# Patient Record
Sex: Female | Born: 1940 | Race: White | Hispanic: No | Marital: Married | Smoking: Former smoker
Health system: Southern US, Community
[De-identification: ages and names within clinical notes are randomized; demographics above are authoritative.]

## PROBLEM LIST (undated history)

## (undated) DIAGNOSIS — I272 Pulmonary hypertension, unspecified: Secondary | ICD-10-CM

## (undated) DIAGNOSIS — Z720 Tobacco use: Secondary | ICD-10-CM

## (undated) DIAGNOSIS — M519 Unspecified thoracic, thoracolumbar and lumbosacral intervertebral disc disorder: Secondary | ICD-10-CM

## (undated) DIAGNOSIS — I350 Nonrheumatic aortic (valve) stenosis: Secondary | ICD-10-CM

## (undated) DIAGNOSIS — I5189 Other ill-defined heart diseases: Secondary | ICD-10-CM

## (undated) DIAGNOSIS — E782 Mixed hyperlipidemia: Secondary | ICD-10-CM

## (undated) DIAGNOSIS — I340 Nonrheumatic mitral (valve) insufficiency: Secondary | ICD-10-CM

## (undated) DIAGNOSIS — E785 Hyperlipidemia, unspecified: Secondary | ICD-10-CM

## (undated) DIAGNOSIS — H9190 Unspecified hearing loss, unspecified ear: Secondary | ICD-10-CM

## (undated) DIAGNOSIS — I4821 Permanent atrial fibrillation: Secondary | ICD-10-CM

## (undated) DIAGNOSIS — J449 Chronic obstructive pulmonary disease, unspecified: Secondary | ICD-10-CM

## (undated) DIAGNOSIS — N1831 Chronic kidney disease, stage 3a: Secondary | ICD-10-CM

## (undated) DIAGNOSIS — C55 Malignant neoplasm of uterus, part unspecified: Secondary | ICD-10-CM

## (undated) DIAGNOSIS — I351 Nonrheumatic aortic (valve) insufficiency: Secondary | ICD-10-CM

## (undated) DIAGNOSIS — F329 Major depressive disorder, single episode, unspecified: Secondary | ICD-10-CM

## (undated) DIAGNOSIS — I10 Essential (primary) hypertension: Secondary | ICD-10-CM

## (undated) DIAGNOSIS — J9611 Chronic respiratory failure with hypoxia: Secondary | ICD-10-CM

## (undated) DIAGNOSIS — M1990 Unspecified osteoarthritis, unspecified site: Secondary | ICD-10-CM

## (undated) DIAGNOSIS — I071 Rheumatic tricuspid insufficiency: Secondary | ICD-10-CM

## (undated) DIAGNOSIS — F32A Depression, unspecified: Secondary | ICD-10-CM

## (undated) HISTORY — DX: Nonrheumatic mitral (valve) insufficiency: I34.0

## (undated) HISTORY — DX: Rheumatic tricuspid insufficiency: I07.1

## (undated) HISTORY — DX: Malignant neoplasm of uterus, part unspecified: C55

## (undated) HISTORY — DX: Chronic respiratory failure with hypoxia: J96.11

## (undated) HISTORY — PX: CERVICAL DISC SURGERY: SHX588

## (undated) HISTORY — DX: Nonrheumatic aortic (valve) insufficiency: I35.1

## (undated) HISTORY — PX: KYPHOPLASTY: SHX5884

## (undated) HISTORY — DX: Nonrheumatic aortic (valve) stenosis: I35.0

## (undated) HISTORY — DX: Pulmonary hypertension, unspecified: I27.20

## (undated) HISTORY — DX: Chronic kidney disease, stage 3a: N18.31

## (undated) HISTORY — DX: Permanent atrial fibrillation: I48.21

## (undated) HISTORY — DX: Chronic obstructive pulmonary disease, unspecified: J44.9

## (undated) HISTORY — PX: ABDOMINAL HYSTERECTOMY: SHX81

## (undated) HISTORY — DX: Essential (primary) hypertension: I10

## (undated) HISTORY — DX: Hyperlipidemia, unspecified: E78.5

## (undated) HISTORY — DX: Tobacco use: Z72.0

## (undated) HISTORY — DX: Other ill-defined heart diseases: I51.89

## (undated) HISTORY — DX: Unspecified thoracic, thoracolumbar and lumbosacral intervertebral disc disorder: M51.9

## (undated) HISTORY — DX: Mixed hyperlipidemia: E78.2

---

## 1999-05-13 ENCOUNTER — Encounter: Payer: Self-pay | Admitting: Cardiology

## 1999-05-13 ENCOUNTER — Ambulatory Visit (HOSPITAL_COMMUNITY): Admission: RE | Admit: 1999-05-13 | Discharge: 1999-05-13 | Payer: Self-pay | Admitting: Cardiology

## 2000-08-10 ENCOUNTER — Ambulatory Visit (HOSPITAL_COMMUNITY): Admission: RE | Admit: 2000-08-10 | Discharge: 2000-08-10 | Payer: Self-pay | Admitting: Cardiology

## 2000-11-02 ENCOUNTER — Ambulatory Visit (HOSPITAL_COMMUNITY): Admission: EM | Admit: 2000-11-02 | Discharge: 2000-11-02 | Payer: Self-pay | Admitting: Family Medicine

## 2000-11-02 ENCOUNTER — Encounter: Payer: Self-pay | Admitting: Family Medicine

## 2005-01-11 ENCOUNTER — Ambulatory Visit: Payer: Self-pay | Admitting: Cardiology

## 2005-02-07 ENCOUNTER — Ambulatory Visit: Payer: Self-pay

## 2005-12-11 ENCOUNTER — Encounter: Admission: RE | Admit: 2005-12-11 | Discharge: 2005-12-11 | Payer: Self-pay | Admitting: Neurosurgery

## 2006-10-13 ENCOUNTER — Encounter: Admission: RE | Admit: 2006-10-13 | Discharge: 2006-10-13 | Payer: Self-pay | Admitting: Family Medicine

## 2008-01-16 ENCOUNTER — Ambulatory Visit: Payer: Self-pay | Admitting: Cardiology

## 2008-06-20 ENCOUNTER — Ambulatory Visit: Payer: Self-pay | Admitting: Cardiology

## 2008-09-12 ENCOUNTER — Encounter: Payer: Self-pay | Admitting: Internal Medicine

## 2009-01-10 ENCOUNTER — Encounter: Payer: Self-pay | Admitting: Cardiology

## 2009-01-30 ENCOUNTER — Encounter: Payer: Self-pay | Admitting: Internal Medicine

## 2009-02-10 DIAGNOSIS — F17200 Nicotine dependence, unspecified, uncomplicated: Secondary | ICD-10-CM | POA: Insufficient documentation

## 2009-02-10 DIAGNOSIS — I272 Pulmonary hypertension, unspecified: Secondary | ICD-10-CM | POA: Insufficient documentation

## 2009-02-10 DIAGNOSIS — E785 Hyperlipidemia, unspecified: Secondary | ICD-10-CM | POA: Insufficient documentation

## 2009-02-10 DIAGNOSIS — I2789 Other specified pulmonary heart diseases: Secondary | ICD-10-CM

## 2009-02-10 DIAGNOSIS — I4891 Unspecified atrial fibrillation: Secondary | ICD-10-CM | POA: Insufficient documentation

## 2009-02-11 ENCOUNTER — Ambulatory Visit: Payer: Self-pay | Admitting: Cardiology

## 2009-09-22 ENCOUNTER — Encounter (INDEPENDENT_AMBULATORY_CARE_PROVIDER_SITE_OTHER): Payer: Self-pay | Admitting: *Deleted

## 2009-10-08 ENCOUNTER — Ambulatory Visit: Payer: Self-pay | Admitting: Cardiology

## 2009-10-08 DIAGNOSIS — R079 Chest pain, unspecified: Secondary | ICD-10-CM

## 2009-11-05 ENCOUNTER — Encounter: Admission: RE | Admit: 2009-11-05 | Discharge: 2009-11-05 | Payer: Self-pay | Admitting: Neurosurgery

## 2010-01-13 ENCOUNTER — Encounter: Admission: RE | Admit: 2010-01-13 | Discharge: 2010-01-13 | Payer: Self-pay | Admitting: Neurosurgery

## 2010-02-09 ENCOUNTER — Encounter: Payer: Self-pay | Admitting: Internal Medicine

## 2010-04-06 ENCOUNTER — Encounter: Admission: RE | Admit: 2010-04-06 | Discharge: 2010-04-06 | Payer: Self-pay | Admitting: Neurosurgery

## 2010-04-20 ENCOUNTER — Ambulatory Visit: Payer: Self-pay | Admitting: Internal Medicine

## 2010-04-20 DIAGNOSIS — R0609 Other forms of dyspnea: Secondary | ICD-10-CM

## 2010-04-20 DIAGNOSIS — R0989 Other specified symptoms and signs involving the circulatory and respiratory systems: Secondary | ICD-10-CM | POA: Insufficient documentation

## 2010-04-21 ENCOUNTER — Encounter: Payer: Self-pay | Admitting: Internal Medicine

## 2010-06-30 ENCOUNTER — Ambulatory Visit: Payer: Self-pay | Admitting: Cardiology

## 2010-06-30 ENCOUNTER — Encounter: Payer: Self-pay | Admitting: Cardiology

## 2010-07-01 ENCOUNTER — Telehealth (INDEPENDENT_AMBULATORY_CARE_PROVIDER_SITE_OTHER): Payer: Self-pay | Admitting: *Deleted

## 2010-07-02 ENCOUNTER — Telehealth (INDEPENDENT_AMBULATORY_CARE_PROVIDER_SITE_OTHER): Payer: Self-pay | Admitting: *Deleted

## 2010-07-05 ENCOUNTER — Ambulatory Visit: Payer: Self-pay

## 2010-07-05 ENCOUNTER — Ambulatory Visit (HOSPITAL_COMMUNITY): Admission: RE | Admit: 2010-07-05 | Payer: Self-pay | Source: Home / Self Care | Admitting: Cardiology

## 2010-07-09 ENCOUNTER — Ambulatory Visit: Payer: Self-pay

## 2010-07-29 ENCOUNTER — Telehealth (INDEPENDENT_AMBULATORY_CARE_PROVIDER_SITE_OTHER): Payer: Self-pay | Admitting: *Deleted

## 2010-07-30 ENCOUNTER — Ambulatory Visit: Admission: RE | Admit: 2010-07-30 | Discharge: 2010-07-30 | Payer: Self-pay | Source: Home / Self Care

## 2010-07-30 ENCOUNTER — Ambulatory Visit (HOSPITAL_COMMUNITY)
Admission: RE | Admit: 2010-07-30 | Discharge: 2010-07-30 | Payer: Self-pay | Source: Home / Self Care | Attending: Cardiology | Admitting: Cardiology

## 2010-07-30 ENCOUNTER — Ambulatory Visit: Admit: 2010-07-30 | Payer: Self-pay

## 2010-08-03 ENCOUNTER — Telehealth: Payer: Self-pay | Admitting: Cardiology

## 2010-08-05 ENCOUNTER — Telehealth: Payer: Self-pay | Admitting: Cardiology

## 2010-08-08 ENCOUNTER — Encounter: Payer: Self-pay | Admitting: Neurosurgery

## 2010-08-08 ENCOUNTER — Encounter: Payer: Self-pay | Admitting: Family Medicine

## 2010-08-16 ENCOUNTER — Telehealth: Payer: Self-pay | Admitting: Cardiology

## 2010-08-19 NOTE — Assessment & Plan Note (Signed)
Summary: rov/jml  Medications Added LOVAZA 1 GM CAPS (OMEGA-3-ACID ETHYL ESTERS) 2 by mouth two times a day MULTIVITAMINS   TABS (MULTIPLE VITAMIN) 1 by mouth daily VITAMIN D (ERGOCALCIFEROL) 50000 UNIT CAPS (ERGOCALCIFEROL) 1 by mouth weekly METOPROLOL SUCCINATE 50 MG XR24H-TAB (METOPROLOL SUCCINATE) 1 by mouth daily LANOXIN 0.25 MG TABS (DIGOXIN) 1 by mouth daily OXYCODONE-ACETAMINOPHEN 5-325 MG TABS (OXYCODONE-ACETAMINOPHEN) 1 by mouth as needed JANTOVEN 6 MG TABS (WARFARIN SODIUM) 1 and 1/2 by mouth as directed LIPITOR 80 MG TABS (ATORVASTATIN CALCIUM) 1 by mouth daily LEXAPRO 20 MG TABS (ESCITALOPRAM OXALATE) 1 by mouth daily TORSEMIDE 20 MG TABS (TORSEMIDE) 1 and 1/2 by mouth daily TRILIPIX 135 MG CPDR (CHOLINE FENOFIBRATE) 1 by mouth daily ADVAIR DISKUS 100-50 MCG/DOSE AEPB (FLUTICASONE-SALMETEROL) as directed * OXYGEN as needed        Visit Type:  Follow-up Referring Provider:  Dr. Glenna Fellows Primary Provider:  Dr. Laurance Flatten  CC:  Atrial Fibrillation.  History of Present Illness: The patient presents for preprocedure evaluation prior to getting injections for back pain. She has permanent atrial fibrillation. She has also been evaluated in the past for chest discomfort with the stress perfusion study several years ago. She refused stress testing last year. She says she is limited by back pain. She gets around and does household activities. With this she does not bring on any chest pressure, neck or arm discomfort. She doesn't really notice palpitations and hasn't had any presyncope or syncope. She's had chronic dyspnea. She does get chest discomfort but it is only at night. She describes a broad area over her entire chest mostly on the left side and down into her abdomen. It seems to be positional. It happening daily for months. Again she has refused stress testing in the past and would refuse it again today. She says she can't intensify this discomfort with activity. She just can't find  a comfortable position. It has to wear off by itself.  Past History:  Past Medical History: Reviewed history from 02/10/2009 and no changes required.  1. Hyperlipidemia.   2. Permanent atrial fibrillation.   3. Mild pulmonary hypertension noted on echo.   4. Diastolic dysfunction noted on echo with some apparent volume       issues in the past, but not severe.   5. Ongoing tobacco abuse.      Past Surgical History:  1. Uterine cancer with hysterectomy.   2. Two cervical disk surgeries.      Review of Systems       As stated in the HPI and negative for all other systems.   Vital Signs:  Patient profile:   70 year old female Height:      64 inches Weight:      163 pounds BMI:     28.08 Pulse rate:   62 / minute Resp:     18 per minute BP sitting:   122 / 80  (right arm)  Vitals Entered By: Levora Angel, CNA (October 08, 2009 9:52 AM)  Physical Exam  General:  Well developed, well nourished, in no acute distress. Head:  normocephalic and atraumatic Eyes:  PERRLA/EOM intact; conjunctiva and lids normal. Mouth:  poor dentition Neck:  Neck supple, no JVD. No masses, thyromegaly or abnormal cervical nodes. Chest Wall:  no deformities or breast masses noted Lungs:  Clear bilaterally to auscultation and percussion. Abdomen:  Bowel sounds positive; abdomen soft and non-tender without masses, organomegaly, or hernias noted. No hepatosplenomegaly. Msk:  Back normal, normal  gait. Muscle strength and tone normal. Extremities:  No clubbing or cyanosis. Neurologic:  Alert and oriented x 3. Skin:  Intact without lesions or rashes. Cervical Nodes:  no significant adenopathy Axillary Nodes:  no significant adenopathy Inguinal Nodes:  no significant adenopathy Psych:  Normal affect.   Detailed Cardiovascular Exam  Neck    Carotids: Carotids full and equal bilaterally without bruits.      Neck Veins: Normal, no JVD.    Heart    Inspection: no deformities or lifts noted.       Palpation: normal PMI with no thrills palpable.      Auscultation: irregular rate and rhythm, S1, S2 without murmurs, rubs, gallops, or clicks.    Vascular    Abdominal Aorta: no palpable masses, pulsations, or audible bruits.      Femoral Pulses: normal femoral pulses bilaterally.      Pedal Pulses: normal pedal pulses bilaterally.      Radial Pulses: normal radial pulses bilaterally.      Peripheral Circulation: no clubbing, cyanosis, or edema noted with normal capillary refill.     Impression & Recommendations:  Problem # 1:  ATRIAL FIBRILLATION (ICD-427.31) The patient tolerates this rhythm and has failed cardioversion. She can discontinue the Coumadin 5 days before the planned procedure. She should restart it when it is felt safe after the procedure and I have asked her to ask Dr. Carloyn Manner about this. She does not need bridging Lovenox injections as she is low risk for thromboembolic events in the short period off of Coumadin. Orders: EKG w/ Interpretation (93000)  Problem # 2:  TOBACCO ABUSE (ICD-305.1) I discussed the need to stop smoking (greater than 3 minutes). I encouraged that she re\re start Chantix which she apparently tried in the past.  Problem # 3:  CHEST PAIN (ICD-786.50) The patient describes chest pain that is atypical. I have suggested that she followup with a stress test as was suggested when she was seen in consultation at [facility] last fall. However, she again refuses. I do not believe that this atypical pain would preclude her from the planned procedure however.  Patient Instructions: 1)  Your physician recommends that you schedule a follow-up appointment in: 6 months in [place] office 2)  Your physician recommends that you continue on your current medications as directed. Please refer to the Current Medication list given to you today. 3)  May hold Coumadin 5 days before surgery and restart when Dr Carloyn Manner says it's OK.

## 2010-08-19 NOTE — Assessment & Plan Note (Signed)
Summary: Pulmonary / preop eval, no sign copd   Visit Type:  Initial Consult Copy to:  Dr. Glenna Fellows Primary Provider/Referring Provider:  Dr. Redge Gainer  CC:  Dyspnea.  History of Present Illness: 62 yowf smoker planning to quit but needs clearance for back surgery by Dr Carloyn Manner.  April 20, 2010  1st pulmonary office eval  states when back is better from either a  shot  or pain pills able to go mailbox and wlk up and down the aisles from grocery store but uses 02 when gets home when she gets tired,   but not using 02  continously or at hs.   Has advair but not using.  mild am cough and congestion.  No recent  significant sore throat, dysphagia, itching, sneezing,  nasal congestion or excess secretions,  fever, chills, sweats, unintended wt loss, pleuritic or exertional cp, hempoptysis, change in activity tolerance  orthopnea pnd or leg swelling. Pt also denies any obvious fluctuation in symptoms with weather or environmental change or other alleviating or aggravating factors.     no better on advair vs off  Current Medications (verified): 1)  Lovaza 1 Gm Caps (Omega-3-Acid Ethyl Esters) .... 2 By Mouth Two Times A Day 2)  Centrum Silver  Tabs (Multiple Vitamins-Minerals) .Marland Kitchen.. 1 Once Daily 3)  Metoprolol Succinate 50 Mg Xr24h-Tab (Metoprolol Succinate) .Marland Kitchen.. 1 By Mouth Daily 4)  Lanoxin 0.25 Mg Tabs (Digoxin) .... 1/2 Once Daily 5)  Oxycodone-Acetaminophen 5-325 Mg Tabs (Oxycodone-Acetaminophen) .... 2 Every 8 Hrs As Needed 6)  Lipitor 80 Mg Tabs (Atorvastatin Calcium) .Marland Kitchen.. 1 By Mouth Daily 7)  Paxil 40 Mg Tabs (Paroxetine Hcl) .Marland Kitchen.. 1 Once Daily 8)  Demadex 20 Mg Tabs (Torsemide) .Marland Kitchen.. 1 Once Daily - Pt States Only Takes "maybe Twice Per Wk" 9)  Advair Diskus 100-50 Mcg/dose Aepb (Fluticasone-Salmeterol) .Marland Kitchen.. 1 Puff Two Times A Day 10)  Oxygen 3 Liters/min .... As Needed With Exertion 11)  Warfarin Sodium 6 Mg Tabs (Warfarin Sodium) .... As Directed Per Clinic 12)  Oscal 500/200 D-3 500-200  Mg-Unit Tabs (Calcium-Vitamin D) .Marland Kitchen.. 1 Once Daily  Allergies (verified): No Known Drug Allergies  Past History:  Past Medical History:  1. Hyperlipidemia.   2. Permanent atrial fibrillation.   3. Mild pulmonary hypertension noted on echo.   4. Diastolic dysfunction noted on echo with some apparent volume       issues in the past, but not severe.   5. Ongoing tobacco abuse.  COPD     - Spirometry April 20, 2010 FEV1  1.38 ( 58%) with ratio 70     Family History: Family History of Coronary Artery Disease:  Negative for respiratory diseases or atopy   Social History: Disabled Children Single Current smoker since age 76.  Smokes 1 1/2 ppd. No ETOH  Review of Systems       The patient complains of shortness of breath with activity, productive cough, chest pain, irregular heartbeats, difficulty swallowing, sneezing, anxiety, depression, hand/feet swelling, and joint stiffness or pain.  The patient denies shortness of breath at rest, non-productive cough, coughing up blood, acid heartburn, indigestion, loss of appetite, weight change, abdominal pain, sore throat, tooth/dental problems, headaches, nasal congestion/difficulty breathing through nose, itching, ear ache, rash, change in color of mucus, and fever.    Vital Signs:  Patient profile:   70 year old female Weight:      158 pounds O2 Sat:      95 % on Room air  Temp:     97.4 degrees F oral Pulse rate:   51 / minute BP sitting:   112 / 76  (left arm)  Vitals Entered By: Tilden Dome (April 20, 2010 2:59 PM)  O2 Flow:  Room air  Serial Vital Signs/Assessments:  Comments: 3:48 PM Ambulatory Pulse Oximetry  Resting; HR___68__    02 Sat__94% on room air___  Lap1 (185 feet)   HR___75__   02 Sat__93% on room air___ Lap2 (185 feet)   HR__80___   02 Sat__94% on room air___    Lap3 (185 feet)   HR__84___   02 Sat__95% on room air__  __x_Test Completed without Difficulty ___Test Stopped due to:  By: Francesca Jewett  CMA    Physical Exam  Additional Exam:  amb wf nad HEENT: nl dentition, turbinates, and orophanx. Nl external ear canals without cough reflex NECK :  without JVD/Nodes/TM/ nl carotid upstrokes bilaterally LUNGS: no acc muscle use, clear to A and P bilaterally without cough on insp or exp maneuvers CV:  RRR  no s3 or murmur or increase in P2, no edema  ABD:  soft and nontender with nl excursion in the supine position. No bruits or organomegaly, bowel sounds nl MS:  warm without deformities, calf tenderness, cyanosis or clubbing SKIN: warm and dry without lesions   NEURO:  alert, approp, no deficits     CXR  Procedure date:  02/09/2010  Findings:      Cardilmegaly with improvement in previously seen bibasilar interstitial accentuation  Impression & Recommendations:  Problem # 1:  DYSPNEA (ICD-786.09) Likely related to chf/caf and deconditioning but unlikely the chronic problem is pulmonary related  Spirometry does not support sign copd though she does appear to have intermittent asthmatic bronchitis and is certainly at risk of post op atx and pna/ resp failure  No need for additional med rx preop but needs to quit smoking for 2 week preop if wants to reduce risks  Problem # 2:  TOBACCO ABUSE (ICD-305.1)   I emphasized that although we never turn away smokers from the pulmonary clinic, we do ask that they understand that the recommendations that were made won't work nearly as well in the presence of continued cigarette exposure and we may reach a point where we can't help the patient if he/she can't quit smoking.    In this case if she can't quit for 2 weeks post op she and her husband will need to understand and accept additional post op risks discussed above.  Medications Added to Medication List This Visit: 1)  Centrum Silver Tabs (Multiple vitamins-minerals) .Marland Kitchen.. 1 once daily 2)  Lanoxin 0.25 Mg Tabs (Digoxin) .... 1/2 once daily 3)  Oxycodone-acetaminophen 5-325 Mg Tabs  (Oxycodone-acetaminophen) .... 2 every 8 hrs as needed 4)  Paxil 40 Mg Tabs (Paroxetine hcl) .Marland Kitchen.. 1 once daily 5)  Demadex 20 Mg Tabs (Torsemide) .Marland Kitchen.. 1 once daily - pt states only takes "maybe twice per wk" 6)  Advair Diskus 100-50 Mcg/dose Aepb (Fluticasone-salmeterol) .Marland Kitchen.. 1 puff two times a day 7)  Oxygen 3 Liters/min  .... As needed with exertion 8)  Warfarin Sodium 6 Mg Tabs (Warfarin sodium) .... As directed per clinic 9)  Oscal 500/200 D-3 500-200 Mg-unit Tabs (Calcium-vitamin d) .Marland Kitchen.. 1 once daily  Other Orders: New Patient Level V [id number]) Spirometry w/Graph (94010) Pulse Oximetry, Ambulatory [id number])  Patient Instructions: 1)  You are cleared for surgery but need to stop smoking for 2 weeks preop to reduce risk of  post op complications

## 2010-08-19 NOTE — Progress Notes (Signed)
Summary: re-fax clearance letter   Phone Note From Other Clinic   Caller: [facility] office [phone]. fax [phone] Request: Talk with Nurse Details of Complaint: pls refax clearance letter.  Initial call taken by: Neil Crouch,  August 05, 2010 3:36 PM  Follow-up for Phone Call        done Follow-up by: Sim Boast, RN,  August 05, 2010 3:40 PM

## 2010-08-19 NOTE — Progress Notes (Signed)
Summary: NEED CLEARANCE    done   Phone Note Other Incoming   Caller: DR.MARK ROY/ [phone] JUL[facility] Summary of Call: NEED SURGICAL CLEARANCE TO BE FAXED TO [id number] PT HAVE L34 L45 ON 08/09/10 Initial call taken by: Delsa Sale,  August 03, 2010 11:18 AM  Follow-up for Phone Call        pt has been given clearance and it will be faxed to MD as requested Follow-up by: Sim Boast, RN,  August 03, 2010 11:21 AM

## 2010-08-19 NOTE — Progress Notes (Signed)
Summary: Stress Echo Instructions  Phone Note Outgoing Call Call back at [facility] Phone [phone]   Call placed by: Eliezer Lofts, EMT-P,  July 02, 2010 10:54 AM Call placed to: Patient Action Taken: Phone Call Completed Summary of Call: Spoke with Patient, gave instructions ref: Stress Echo appt. Eliezer Lofts, EMT-P  July 02, 2010 10:55 AM

## 2010-08-19 NOTE — Progress Notes (Signed)
Summary: dobutamine echo appt  Phone Note Outgoing Call Call back at [facility] Phone [phone]   Call placed by: Eliezer Lofts, EMT-P,  July 29, 2010 11:00 AM Action Taken: Phone Call Completed Summary of Call: Left message on answering machine reference dobutamine echo appt. Eliezer Lofts, EMT-P  July 29, 2010 11:00 AM

## 2010-08-19 NOTE — Assessment & Plan Note (Signed)
Summary: [facility]      Allergies Added: NKDA  Visit Type:  Follow-up Primary Joelle Flessner:  Dr. Redge Gainer  CC:  Pre Op.  History of Present Illness: The patient presented for preoperative evaluation prior to back surgery. She has a history of atrial fibrillation and has been on Coumadin. She tolerates this rhythm well. She does not report presyncope or syncope. She does have rare palpitations. There is no history of chest discomfort, neck or arm discomfort. She does have dyspnea with exertion which has been chronic but she does not describe PND or orthopnea. She continues to smoke cigarettes though she is taking Chantix and using the electronic cigarette to quit.  She is very limited in her activity because of back and leg pain and she is to have back surgery in January.  Current Medications (verified): 1)  Centrum Silver  Tabs (Multiple Vitamins-Minerals) .Marland Kitchen.. 1 Once Daily 2)  Metoprolol Succinate 50 Mg Xr24h-Tab (Metoprolol Succinate) .Marland Kitchen.. 1 By Mouth Daily 3)  Lanoxin 0.25 Mg Tabs (Digoxin) .... 1/2 Once Daily 4)  Oxycodone-Acetaminophen 5-325 Mg Tabs (Oxycodone-Acetaminophen) .... 2 Every 8 Hrs As Needed 5)  Lipitor 80 Mg Tabs (Atorvastatin Calcium) .Marland Kitchen.. 1 By Mouth Daily 6)  Paxil 40 Mg Tabs (Paroxetine Hcl) .Marland Kitchen.. 1 Once Daily 7)  Demadex 20 Mg Tabs (Torsemide) .Marland Kitchen.. 1 Once Daily - Pt States Only Takes "maybe Twice Per Wk" 8)  Advair Diskus 100-50 Mcg/dose Aepb (Fluticasone-Salmeterol) .Marland Kitchen.. 1 Puff Two Times A Day 9)  Oxygen 3 Liters/min .... As Needed With Exertion 10)  Warfarin Sodium 6 Mg Tabs (Warfarin Sodium) .... As Directed Per Clinic 11)  Oscal 500/200 D-3 500-200 Mg-Unit Tabs (Calcium-Vitamin D) .Marland Kitchen.. 1 Once Daily  Allergies (verified): No Known Drug Allergies  Past History:  Past Medical History: Reviewed history from 04/20/2010 and no changes required.  1. Hyperlipidemia.   2. Permanent atrial fibrillation.   3. Mild pulmonary hypertension noted on echo.   4.  Diastolic dysfunction noted on echo with some apparent volume       issues in the past, but not severe.   5. Ongoing tobacco abuse.  COPD     - Spirometry April 20, 2010 FEV1  1.38 ( 58%) with ratio 70     Past Surgical History: Reviewed history from 10/08/2009 and no changes required.  1. Uterine cancer with hysterectomy.   2. Two cervical disk surgeries.      Review of Systems       As stated in the HPI and negative for all other systems.   Vital Signs:  Patient profile:   70 year old female Height:      64 inches Weight:      160 pounds BMI:     27.56 Pulse rate:   75 / minute Resp:     18 per minute BP sitting:   124 / 88  (right arm)  Vitals Entered By: Levora Angel, CNA (June 30, 2010 1:56 PM)  Physical Exam  General:  Well developed, well nourished, in no acute distress. Head:  normocephalic and atraumatic Eyes:  PERRLA/EOM intact; conjunctiva and lids normal. Neck:  Neck supple, no JVD. No masses, thyromegaly or abnormal cervical nodes. Chest Wall:  no deformities Lungs:  Clear bilaterally to auscultation and percussion, diminished BS. Abdomen:  Bowel sounds positive; abdomen soft and non-tender without masses, organomegaly, or hernias noted. No hepatosplenomegaly. Msk:  Back normal, normal gait. Muscle strength and tone normal. Extremities:  No clubbing or cyanosis. Neurologic:  Alert and oriented x 3. Skin:  Intact without lesions or rashes. Cervical Nodes:  no significant adenopathy Axillary Nodes:  no significant adenopathy Inguinal Nodes:  no significant adenopathy Psych:  Normal affect.   Detailed Cardiovascular Exam  Neck    Carotids: Carotids full and equal bilaterally without bruits.      Neck Veins: Normal, no JVD.    Heart    Inspection: no deformities or lifts noted.      Palpation: normal PMI with no thrills palpable.      Auscultation: irregular rate and rhythm, S1, S2 without murmurs, rubs, gallops, or clicks.    Vascular     Abdominal Aorta: no palpable masses, pulsations, or audible bruits.      Femoral Pulses: normal femoral pulses bilaterally.      Pedal Pulses: normal pedal pulses bilaterally.      Radial Pulses: normal radial pulses bilaterally.      Peripheral Circulation: no clubbing, cyanosis, or edema noted with normal capillary refill.     EKG  Procedure date:  06/30/2010  Findings:      atrial fibrillation, premature ectopic complexes, low voltage in the limb leads, right bundle branch block, nonspecific anterior ST depression, no change from previous  Impression & Recommendations:  Problem # 1:  PREOPERATIVE EXAMINATION (ICD-V72.84) The patient has a very low functional level. She had significant cardiovascular risk factors. Though she has no chest pain she has dyspnea. She is going for a procedure which is moderate risk from a cardiovascular standpoint. Given this and according to [facility] AHA guideline stress testing is indicated. She would not be able to walk on treadmill. She apparently had significant pulmonary complaints with adenosine previously. I will try to screen her with a dobutamine echocardiogram.  Problem # 2:  TOBACCO ABUSE (ICD-305.1) I am delighted with her aggressive attempt to quit smoking and hopefully she can be successful.  Problem # 3:  ATRIAL FIBRILLATION (ICD-427.31) She tolerates this rhythm with rate control and anticoagulation. This is followed by her primary physician.  Other Orders: Dobutamine Echo (Dobutamine Echo)  Patient Instructions: 1)  Your physician has requested that you have a dobutamine echocardiogram.  For further information please visit [email or web address].  Please follow instruction sheet as given. 2)  Your physician wants you to follow-up in:  1 year.  You will receive a reminder letter in the mail two months in advance. If you don't receive a letter, please call our office to schedule the follow-up appointment.

## 2010-08-19 NOTE — Progress Notes (Signed)
Summary: Stress Echo Pre-Procedure  Phone Note Outgoing Call Call back at [facility] Phone [phone]   Call placed by: Hubbard Robinson RN,  July 01, 2010 5:11 PM Call placed to: Patient Reason for Call: Confirm/change Appt Summary of Call: Dobutamine Echo instructions given to patient.

## 2010-08-19 NOTE — Letter (Signed)
Summary: Appointment - Missed  [place], [place]    Phone:   Fax:      September 22, 2009 MRN: [id number]   [place], [place]  [address]   Dear Ms. Clinton Gallant,  Our records indicate you missed your appointment on   09-16-2009   with  Dr. Percival Spanish     It is very important that we reach you to reschedule this appointment. We look forward to participating in your health care needs. Please contact us at the number listed above at your earliest convenience to reschedule this appointment.     Sincerely,  [place] Scheduling Team

## 2010-08-25 NOTE — Progress Notes (Signed)
Summary: need clearnace letter signed off and faxed   Phone Note Other Incoming   Caller: DR.Mark Roy/ [phone] fax [phone] Summary of Call: Clearance note to be signed off by Dr.Guillaume Weninger and faxed to office Initial call taken by: Delsa Sale,  August 16, 2010 10:29 AM  Follow-up for Phone Call        Spoke with nurse. Will resend echo that states pt is able to proceed with surgery. Levora Angel, CNA  August 16, 2010 10:54 AM  Follow-up by: Levora Angel, CNA,  August 16, 2010 10:54 AM

## 2010-11-30 NOTE — Assessment & Plan Note (Signed)
[facility] OFFICE NOTE   NAME:Melinda Collins, Melinda Collins                   MRN:          [id number]  DATE:01/16/2008                            DOB:          06-25-1941    PRIMARY CARE PHYSICIAN:  Chipper Herb, MD.   REASON FOR PRESENTATION:  Evaluate the patient with atrial fibrillation.   HISTORY OF PRESENT ILLNESS:  The patient is a pleasant 70 year old who  has been followed by Dr. Olevia Perches for some years for atrial fibrillation.  She has failed cardioversion and has been maintained on anticoagulation  and rhythm and rate control.  She has done well with this regimen.  She  has a history of some volume overload and mention of diastolic heart  failure by Dr. Olevia Perches.  Her last stress perfusion study was in 2006,  which demonstrated no evidence of ischemia or infarct.  She does have  some mild pulmonary hypertension noted on her previous echo in 2006.   She is presenting here for a routine followup.  She wants to be seen  closer to her home in [place].  She does have dyspnea with exertion, but  she has been a longstanding smoker.  This is something she is noticing  more recently as she has been trying to walk.  She is not having any  resting shortness of breath.  Denies any PND or orthopnea.  She does not  notice palpitations.  She has had no presyncope or syncope.  She has had  no chest pressure, neck or arm discomfort.   She is trying to stop smoking.  She is smoking still a pack a day, but  she smoking only a little bit from each cigarette.  She did try nicotine  patches, but she smoked while she was using them.   PAST MEDICAL HISTORY:  1. Hyperlipidemia.  2. Permanent atrial fibrillation.  3. Mild pulmonary hypertension noted on echo.  4. Diastolic dysfunction noted on echo with some apparent volume      issues in the past, but not severe.  5. Ongoing tobacco abuse.   PAST SURGICAL HISTORY:  1. Uterine  cancer with hysterectomy.  2. Two cervical disk surgeries.   ALLERGIES AND INTOLERANCES:  None.   MEDICATIONS:  1. Jantoven.  2. Ultram.  3. Lovaza 4 mg daily.  4. Prilosec.  5. Zoloft 150 mg daily.  6. Demadex 20 mg daily.  7. Lipitor 80 mg daily.  8. Trilipix 135 mg daily.  9. Lanoxin 0.25 mg 1/2 tablet daily.  10.Multivitamin.  11.Toprol 25 mg b.i.d.   REVIEW OF SYSTEMS:  As stated in the HPI.  Positive for occasional rare  palpitations, wheezing, occasional headaches, and occasional lower  extremity swelling.  Negative for all other systems.   PHYSICAL EXAMINATION:  GENERAL:  The patient is very pleasant and in no  distress.  VITAL SIGNS:  Blood pressure 112/80, heart rate 58 and irregular, weight  174 pounds, and body mass index 28.  HEENT:  Eyelids are unremarkable.  Pupils are equal, round, and reactive  to light.  Fundi not visualized.  Oral mucosa unremarkable.  NECK:  No jugular venous distention.  Waveform within normal limits.  Carotid upstroke brisk and symmetric.  No bruits.  No thyromegaly.  LYMPHATICS:  No cervical, axillary, or inguinal adenopathy.  LUNGS:  Decreased breath sounds with expiratory wheezes throughout.  No  crackles.  BACK:  No costovertebral angle tenderness.  CHEST:  Unremarkable.  HEART:  PMI not displaced or sustained.  S1 and S2 within normal limits.  No S3.  No murmurs.  ABDOMEN:  Flat.  Positive bowel sounds normal in frequency and pitch.  No bruits, no rebound, no guarding, no midline pulsatile mass.  No  hepatomegaly.  No splenomegaly.  SKIN:  No rashes.  No nodules.  EXTREMITIES:  2+ pulses throughout.  No cyanosis, no clubbing, no edema.  NEURO:  Oriented to person, place, and time.  Cranial nerves II-XII  grossly intact.  Motor grossly intact.   EKG, atrial fibrillation, right bundle-branch block, no change from  previous EKGs.   ASSESSMENT/PLAN:  1. Atrial fibrillation.  The patient is tolerating the rate control      and  anticoagulation.  At this point, no further change in her      therapy is warranted.  No further cardiovascular sting is      suggested.  2. Tobacco.  I talked about the need to stop smoking (greater than 3      minutes).  She is going to keep trying to cut down.  She does not      think she will be able to go cold [place].  She can talk with      Josie Saunders and the pharmacist about possible Chantix.  3. Dyslipidemia.  She was recently tried on Niaspan, but says she just      burned out when she tried this and she stopped taking it.  She      does not have severely elevated lipids, though her particle size is      not ideal.  She is going to be seen in the [facility].  I will defer management to them.  4. Diastolic dysfunction.  The patient does have a history of this by      echo with some mild volume issues, but seems to be euvolemic.  I do      not think her shortness of breath is related      to this, but rather to her smoking.  She will continue with a low      dose of diuretic.  5. Followup.  I will see her back in 1 year or sooner if needed.     [name], MD, [facility]  Electronically Signed    JH/MedQ  DD: 01/16/2008  DT: 01/17/2008  Job #: [id number]   cc:   Chipper Herb, M.D.

## 2010-11-30 NOTE — Assessment & Plan Note (Signed)
[place] [facility] OFFICE NOTE   NAME:Melinda Collins, Melinda Collins                   MRN:          [id number]  DATE:02/11/2009                            DOB:          [birth year]    REASON FOR PRESENTATION:  The patient with atrial fibrillation.   HISTORY OF PRESENT ILLNESS:  The patient presents for 1-year followup.  Since I last saw her, she has had a couple of hospitalizations  apparently for exacerbation of chronic lung disease.  She says she has  bronchitis, asthma, and pneumonia.  She said, however, after her last  hospitalization a more ahead last month she quit smoking!  She is still  short of breath with mild-to-moderate exertion but not describing PND or  orthopnea.  She is not really noticing any palpitations and has had no  presyncope or syncope.  She has some abdominal bloating particularly  after she eats.  She does not describe any lower extremity swelling or  particular weight gain.   Of note, the patient took a on her own to take 40 mg of Demadex daily  for awhile.  She did go and have her labs drawn earlier this month by  Dr. Laurance Flatten and was noted to have a BUN of 14 and creatinine of 1.77.  Her  previous was 0.97.  She since cut back on the diuretics as described and  is getting her basic metabolic profile rechecked.   PAST MEDICAL HISTORY:  Permanent atrial fibrillation (she has failed  cardioversion x3 and was maintained on anticoagulation of rate control,  previously see seeing Dr. Eustace Quail), diastolic dysfunction, previous  tobacco abuse, dyslipidemia, uterine cancer with hysterectomy, cervical  disk surgeries.   ALLERGIES/INTOLERANCES:  None.   MEDICATIONS:  1. Lipitor 80 mg daily.  2. Advair.  3. Lovaza.  4. Torsemide 20 mg (she is currently taking this about every other      day).  5. Toprol 50 mg daily.  6. Vitamin D.  7. Jantoven.  8. Lanoxin 0.25 mg daily.  9. Lexapro.  10.Trilipix.   REVIEW OF SYSTEMS:  As stated in HPI.  Negative for other systems.   PHYSICAL EXAMINATION:  GENERAL:  The patient is in no distress.  She is  pleasant.  VITAL SIGNS:  Blood pressure 110/70, heart rate 76 and irregular.  HEENT:  Eyes are unremarkable.  Pupils equal, round, and reactive to  light.  Fundi not visualized.  Oral mucosa normal.  NECK:  No jugular distention at 45 degrees.  Carotid upstroke brisk and  symmetrical.  No bruits.  No thyromegaly.  LYMPHATICS:  No adenopathy.  LUNGS:  Few expiratory scattered wheezes.  No crackles.  BACK:  No costovertebral angle tenderness.  CHEST:  Unremarkable.  HEART:  PMI not displaced or sustained.  S1 and S2 within normal limits.  No S3, no S4, no clicks, no rubs, no murmurs.  ABDOMEN:  Mildly obese, positive bowel sounds, normal in frequency and  pitch.  No bruits, rebound, guarding, or midline pulsatile mass.  No  organomegaly.  SKIN:  No rashes, no nodules.  EXTREMITIES:  Pulse 2+.  No edema.   EKG atrial fibrillation, rate 76, right bundle-branch block, no acute ST-  T wave changes, no change from previous.   ASSESSMENT AND PLAN:  1. Atrial fibrillation.  The patient is in permanent atrial      fibrillation.  This has been the course followed by Dr. Olevia Perches over      the years and I agree with this.  She will remain on      anticoagulation.  2. Tobacco abuse.  I am so proud of her for stopping smoking and      encouraged continued, complete abstinence.  3. Mild pulmonary hypertension.  This can be followed clinically.  No      further therapy is planned at this point.  4. Diastolic dysfunction.  She appears to be euvolemic and will remain      on the lower dose of Demadex.  I cautioned her about self-      medicating.  She is going to have her creatinine checked.  She was      prerenal with markedly elevated creatinine compared to baseline.      This has been followed by Dr. Laurance Flatten.  5. Followup.  She would like to be seen twice a  year and I will      accommodate this.     [name], MD, [facility]  Electronically Signed    JH/MedQ  DD: 02/11/2009  DT: 02/12/2009  Job #: [id number]   cc:   Chipper Herb, M.D.

## 2011-02-18 ENCOUNTER — Encounter: Payer: Self-pay | Admitting: Cardiology

## 2011-05-03 ENCOUNTER — Encounter: Payer: Self-pay | Admitting: Cardiology

## 2011-05-05 ENCOUNTER — Encounter: Payer: Self-pay | Admitting: Cardiology

## 2011-05-11 ENCOUNTER — Ambulatory Visit (INDEPENDENT_AMBULATORY_CARE_PROVIDER_SITE_OTHER): Payer: Medicaid Other | Admitting: Cardiology

## 2011-05-11 ENCOUNTER — Encounter: Payer: Self-pay | Admitting: Cardiology

## 2011-05-11 DIAGNOSIS — F17200 Nicotine dependence, unspecified, uncomplicated: Secondary | ICD-10-CM

## 2011-05-11 DIAGNOSIS — E785 Hyperlipidemia, unspecified: Secondary | ICD-10-CM

## 2011-05-11 DIAGNOSIS — I4891 Unspecified atrial fibrillation: Secondary | ICD-10-CM

## 2011-05-11 DIAGNOSIS — R079 Chest pain, unspecified: Secondary | ICD-10-CM

## 2011-05-11 NOTE — Assessment & Plan Note (Signed)
She tolerates warfarin.  I will check a 24 hour Holter to follow up on the rate control and to evaluate the vague symptoms that are mentioned above.

## 2011-05-11 NOTE — Assessment & Plan Note (Signed)
She has atypical pain and a negative dobutamine echo earlier this year.  No further testing is indicated.

## 2011-05-11 NOTE — Assessment & Plan Note (Signed)
I again discussed the need to stop smoking.  I don't think that she will ever be able to do this however.

## 2011-05-11 NOTE — Patient Instructions (Addendum)
Please have a monitor placed at [facility]  The current medical regimen is effective;  continue present plan and medications.  Follow up with Dr Percival Spanish in 2 months.

## 2011-05-11 NOTE — Progress Notes (Signed)
HPI The patient presents for followup of her atrial fibrillation. I saw her late last year preoperatively for back surgery. She had a negative dobutamine echocardiogram. She is slowly recovering from his back surgery but unfortunately she is quite limited in her activities with pain. She does report episodes of waking up feeling uncomfortable and restless though she cannot quantify or qualify this as chest discomfort or palpitations. She says she tosses and turns all night.  She continues to smoke cigarettes. She's not having any presyncope or syncope. She has a cough productive of yellow phlegm but no new fevers or chills. She has no new weight gain or edema. She is not describing PND or orthopnea.  No Known Allergies  Current Outpatient Prescriptions  Medication Sig Dispense Refill  . atorvastatin (LIPITOR) 80 MG tablet Take 80 mg by mouth daily.        . calcium-vitamin D (OSCAL 500/200 D-3) 500-200 MG-UNIT per tablet Take 1 tablet by mouth daily.        . digoxin (LANOXIN) 0.25 MG tablet Take 125 mcg by mouth daily.        . Fluticasone-Salmeterol (ADVAIR DISKUS) 100-50 MCG/DOSE AEPB Inhale 1 puff into the lungs 2 (two) times daily.        . metoprolol (TOPROL-XL) 50 MG 24 hr tablet Take 50 mg by mouth daily.        . Multiple Vitamins-Minerals (CENTRUM SILVER PO) Take 1 tablet by mouth daily.        . NON FORMULARY Oxygen 3L/min       . oxyCODONE-acetaminophen (PERCOCET) 5-325 MG per tablet Take 2 tablets by mouth every 8 (eight) hours as needed.        Marland Kitchen PARoxetine (PAXIL) 40 MG tablet Take 40 mg by mouth daily.        Marland Kitchen torsemide (DEMADEX) 20 MG tablet Take 20 mg by mouth daily. Pt only takes maybe twice/week       . warfarin (COUMADIN) 6 MG tablet Take 6 mg by mouth as directed.          Past Medical History  Diagnosis Date  . HLD (hyperlipidemia)   . Permanent atrial fibrillation   . HTN (hypertension)     mild; pulmonary  . Diastolic dysfunction     noted on echo w/some apparent  volume issues in past but not severe   . Tobacco abuse   . COPD (chronic obstructive pulmonary disease)     spirometry 04/20/10 FEV1 1.38 (58%) with ration 70  . Uterine cancer     Past Surgical History  Procedure Date  . Abdominal hysterectomy     abd?  . Cervical disc surgery     x2    ROS:  As stated in the HPI and negative for all other systems.  PHYSICAL EXAM BP 142/70  Pulse 59  Resp 18  Ht 5\' 5"  (1.651 m)  Wt 154 lb (69.854 kg)  BMI 25.63 kg/m2 GENERAL:  Well appearing HEENT:  Pupils equal round and reactive, fundi not visualized, oral mucosa unremarkable NECK:  No jugular venous distention, waveform within normal limits, carotid upstroke brisk and symmetric, no bruits, no thyromegaly LYMPHATICS:  No cervical, inguinal adenopathy LUNGS:  Clear to auscultation bilaterally BACK:  No CVA tenderness CHEST:  Unremarkable HEART:  PMI not displaced or sustained,S1 and S2 within normal limits, no S3, no S4, no clicks, no rubs, no murmurs, irregular ABD:  Flat, positive bowel sounds normal in frequency in pitch, no bruits, no rebound,  no guarding, no midline pulsatile mass, no hepatomegaly, no splenomegaly EXT:  2 plus pulses throughout, no edema, no cyanosis no clubbing SKIN:  No rashes no nodules NEURO:  Cranial nerves II through XII grossly intact, motor grossly intact throughout PSYCH:  Cognitively intact, oriented to person place and time  EKG:  Atrial fibrillation rate, 59, left axis deviation, right bundle branch block, left anterior fascicular block, nonspecific anterior T wave changes.  ASSESSMENT AND PLAN

## 2011-05-13 ENCOUNTER — Telehealth: Payer: Self-pay | Admitting: *Deleted

## 2011-05-13 NOTE — Telephone Encounter (Signed)
Received monitor results from [facility] - that demonstrate pt having up to 4.5 second pauses.  Pt reports no symptoms other than feeling SOB on occasion and restlessness.  Reviewed results with Dr Harrington Challenger who consulted with Dr Lovena Le.  Orders were given for the pt to STOP Toprol and Lanoxin and follow up next week in the [place] office.  Pt states understanding and an appointment was given for 10/30.  Pt aware to call MD on call if her symptoms worsen or if she starts to feel dizzy or has syncope.

## 2011-05-17 ENCOUNTER — Encounter: Payer: Self-pay | Admitting: Cardiology

## 2011-05-17 ENCOUNTER — Ambulatory Visit (INDEPENDENT_AMBULATORY_CARE_PROVIDER_SITE_OTHER): Payer: Medicare Other | Admitting: Cardiology

## 2011-05-17 DIAGNOSIS — I4891 Unspecified atrial fibrillation: Secondary | ICD-10-CM

## 2011-05-17 DIAGNOSIS — F17200 Nicotine dependence, unspecified, uncomplicated: Secondary | ICD-10-CM

## 2011-05-17 DIAGNOSIS — I10 Essential (primary) hypertension: Secondary | ICD-10-CM | POA: Insufficient documentation

## 2011-05-17 DIAGNOSIS — R079 Chest pain, unspecified: Secondary | ICD-10-CM

## 2011-05-17 MED ORDER — AMLODIPINE BESYLATE 2.5 MG PO TABS: 2.5000 mg | ORAL_TABLET | Freq: Every day | ORAL | Status: DC

## 2011-05-17 NOTE — Assessment & Plan Note (Signed)
She is off dig and beta blocker. I will followup in another Holter in 2 weeks and make further judgments about need for rate control. She will continue on anticoagulation.

## 2011-05-17 NOTE — Assessment & Plan Note (Signed)
We again talked about the need to stop smoking. She cannot do this.

## 2011-05-17 NOTE — Assessment & Plan Note (Signed)
I will at a low dose of Norvasc 2.5 mg. I don't think this will have significant effect on her heart rate.

## 2011-05-17 NOTE — Assessment & Plan Note (Signed)
She has atypical pain and a negative dobutamine echo earlier this year.  No further testing is indicated.

## 2011-05-17 NOTE — Patient Instructions (Signed)
Please start Norvasc 2.5 mg a day  Continue all other medications as listed  Your physician has recommended that you wear a holter monitor for 24 hour (in 2 weeks). Holter monitors are medical devices that record the heart's electrical activity. Doctors most often use these monitors to diagnose arrhythmias. Arrhythmias are problems with the speed or rhythm of the heartbeat. The monitor is a small, portable device. You can wear one while you do your normal daily activities. This is usually used to diagnose what is causing palpitations/syncope (passing out).  Please see Dr Percival Spanish in follow up with a blood pressure diary, after your monitor.

## 2011-05-17 NOTE — Progress Notes (Signed)
HPI The patient presents for followup of atrial fibrillation. After the last visit I had her wear a Holter monitor which demonstrated many 3 second pauses and heart rates as low as 20 typically while she was asleep. Her metoprolol and digoxin were discontinued and she returns for followup. She actually doesn't notice any tachycardia palpitations and aside from some mild orthostatic symptoms he doesn't have presyncope or syncope. She denies any chest pressure, neck or arm discomfort. She has had an elevated blood pressure reading since this medicine was stopped. She may have had some headaches and some increased dyspnea though she is not describing PND or orthopnea. His had no weight gain or edema. She unfortunately continues to smoke cigarettes. She's had no chest pressure, neck or arm discomfort.   No Known Allergies  Current Outpatient Prescriptions  Medication Sig Dispense Refill  . atorvastatin (LIPITOR) 80 MG tablet Take 80 mg by mouth daily.        . calcium-vitamin D (OSCAL 500/200 D-3) 500-200 MG-UNIT per tablet Take 1 tablet by mouth daily.        . Fluticasone-Salmeterol (ADVAIR DISKUS) 100-50 MCG/DOSE AEPB Inhale 1 puff into the lungs 2 (two) times daily.        . Multiple Vitamins-Minerals (CENTRUM SILVER PO) Take 1 tablet by mouth daily.        . NON FORMULARY Oxygen 3L/min       . oxyCODONE-acetaminophen (PERCOCET) 5-325 MG per tablet Take 2 tablets by mouth every 8 (eight) hours as needed.        Marland Kitchen PARoxetine (PAXIL) 40 MG tablet Take 40 mg by mouth daily.        Marland Kitchen torsemide (DEMADEX) 20 MG tablet Take 20 mg by mouth daily. Pt only takes maybe twice/week       . warfarin (COUMADIN) 6 MG tablet Take 6 mg by mouth as directed.          Past Medical History  Diagnosis Date  . HLD (hyperlipidemia)   . Permanent atrial fibrillation   . HTN (hypertension)     mild; pulmonary  . Diastolic dysfunction     noted on echo w/some apparent volume issues in past but not severe   . Tobacco  abuse   . COPD (chronic obstructive pulmonary disease)     spirometry 04/20/10 FEV1 1.38 (58%) with ration 70  . Uterine cancer     Past Surgical History  Procedure Date  . Abdominal hysterectomy     abd?  . Cervical disc surgery     x2    ROS:  As stated in the HPI and negative for all other systems.  PHYSICAL EXAM BP 163/94  Pulse 92  Resp 18  Ht 5\' 4"  (1.626 m)  Wt 152 lb (68.947 kg)  BMI 26.09 kg/m2 GENERAL:  Well appearing HEENT:  Pupils equal round and reactive, fundi not visualized, oral mucosa unremarkable NECK:  No jugular venous distention, waveform within normal limits, carotid upstroke brisk and symmetric, no bruits, no thyromegaly LYMPHATICS:  No cervical, inguinal adenopathy LUNGS:  Clear to auscultation bilaterally BACK:  No CVA tenderness CHEST:  Unremarkable HEART:  PMI not displaced or sustained,S1 and S2 within normal limits, no S3, no S4, no clicks, no rubs, no murmurs, irregular ABD:  Flat, positive bowel sounds normal in frequency in pitch, no bruits, no rebound, no guarding, no midline pulsatile mass, no hepatomegaly, no splenomegaly EXT:  2 plus pulses throughout, no edema, no cyanosis no clubbing SKIN:  No rashes  no nodules NEURO:  Cranial nerves II through XII grossly intact, motor grossly intact throughout PSYCH:  Cognitively intact, oriented to person place and time  EKG:  Atrial fibrillation rate, 81, left axis deviation, right bundle branch block, left anterior fascicular block, nonspecific anterior T wave changes. 05/17/2011   ASSESSMENT AND PLAN

## 2011-05-26 ENCOUNTER — Telehealth: Payer: Self-pay

## 2011-05-26 NOTE — Telephone Encounter (Signed)
Patient would like to have monitor done in [place], she said she does not have a ride to come to [employer].

## 2011-05-27 ENCOUNTER — Telehealth: Payer: Self-pay | Admitting: *Deleted

## 2011-05-27 NOTE — Telephone Encounter (Signed)
Order mailed to pts home address to have a 24 hour holter monitor placed to follow up medication changes after previous monitor demonstrated 3 and 4 second pauses.

## 2011-06-08 ENCOUNTER — Telehealth: Payer: Self-pay | Admitting: Cardiology

## 2011-06-08 NOTE — Telephone Encounter (Signed)
New problem Pt wants to know monitor results Please call

## 2011-06-08 NOTE — Telephone Encounter (Signed)
Patient called wanting to know results of heart monitor.Results not ready.Keep appointment 11/28

## 2011-06-14 NOTE — Telephone Encounter (Signed)
Will review with pt at appointment tomorrow.

## 2011-06-15 ENCOUNTER — Encounter: Payer: Self-pay | Admitting: Cardiology

## 2011-06-15 ENCOUNTER — Ambulatory Visit (INDEPENDENT_AMBULATORY_CARE_PROVIDER_SITE_OTHER): Payer: Medicare Other | Admitting: Cardiology

## 2011-06-15 DIAGNOSIS — I10 Essential (primary) hypertension: Secondary | ICD-10-CM

## 2011-06-15 DIAGNOSIS — F17200 Nicotine dependence, unspecified, uncomplicated: Secondary | ICD-10-CM

## 2011-06-15 DIAGNOSIS — I4891 Unspecified atrial fibrillation: Secondary | ICD-10-CM

## 2011-06-15 NOTE — Assessment & Plan Note (Signed)
Her blood pressure is controlled on her home monitor. No change in therapy is indicated.

## 2011-06-15 NOTE — Assessment & Plan Note (Signed)
She is not having any bradycardic rates or symptomatic pauses. Overall her rate is controlled. Therefore, she will continue the meds as listed.

## 2011-06-15 NOTE — Progress Notes (Signed)
HPI The patient presents for followup of atrial fibrillation. A Holter monitor earlier this year demonstrated many 3 second pauses and heart rates as low as 20 typically while she was asleep. Her metoprolol and digoxin were discontinued.  She since has worn another Holter and she is back to review this.  This has demonstrated no further positives. Over all her rate is well controlled. She'll notice occasionally in the evening her heart racing slightly but this doesn't particularly bother her. She's not having any chest pressure, neck or arm discomfort. She's not having any presyncope or syncope. She has no new shortness of breath, PND or orthopnea.   No Known Allergies  Current Outpatient Prescriptions  Medication Sig Dispense Refill  . amLODipine (NORVASC) 2.5 MG tablet Take 1 tablet (2.5 mg total) by mouth daily.  30 tablet  11  . atorvastatin (LIPITOR) 80 MG tablet Take 80 mg by mouth daily.        . Multiple Vitamins-Minerals (CENTRUM SILVER PO) Take 1 tablet by mouth daily.        . NON FORMULARY Oxygen 3L/min       . oxyCODONE-acetaminophen (PERCOCET) 5-325 MG per tablet Take 2 tablets by mouth every 8 (eight) hours as needed.        Marland Kitchen PARoxetine (PAXIL) 40 MG tablet Take 40 mg by mouth daily.        Marland Kitchen torsemide (DEMADEX) 20 MG tablet Take 20 mg by mouth daily. Pt only takes maybe twice/week       . warfarin (COUMADIN) 6 MG tablet Take 6 mg by mouth as directed.          Past Medical History  Diagnosis Date  . HLD (hyperlipidemia)   . Permanent atrial fibrillation   . HTN (hypertension)     mild; pulmonary  . Diastolic dysfunction     noted on echo w/some apparent volume issues in past but not severe   . Tobacco abuse   . COPD (chronic obstructive pulmonary disease)     spirometry 04/20/10 FEV1 1.38 (58%) with ration 70  . Uterine cancer     Past Surgical History  Procedure Date  . Abdominal hysterectomy     abd?  . Cervical disc surgery     x2    ROS:  Back pain.   Otherwise as stated in the HPI and negative for all other systems.  PHYSICAL EXAM BP 140/80  Pulse 100  Ht 5\' 5"  (1.651 m)  Wt 156 lb (70.761 kg)  BMI 25.96 kg/m2 GENERAL:  Well appearing HEENT:  Pupils equal round and reactive, fundi not visualized, oral mucosa unremarkable NECK:  No jugular venous distention, waveform within normal limits, carotid upstroke brisk and symmetric, no bruits, no thyromegaly LYMPHATICS:  No cervical, inguinal adenopathy LUNGS:  Clear to auscultation bilaterally BACK:  No CVA tenderness CHEST:  Unremarkable HEART:  PMI not displaced or sustained,S1 and S2 within normal limits, no S3, no S4, no clicks, no rubs, no murmurs, irregular ABD:  Flat, positive bowel sounds normal in frequency in pitch, no bruits, no rebound, no guarding, no midline pulsatile mass, no hepatomegaly, no splenomegaly EXT:  2 plus pulses throughout, no edema, no cyanosis no clubbing SKIN:  No rashes no nodules NEURO:  Cranial nerves II through XII grossly intact, motor grossly intact throughout PSYCH:  Cognitively intact, oriented to person place and time  EKG:  Atrial fibrillation rate, 101, left axis deviation, right bundle branch block, left anterior fascicular block, nonspecific anterior T  wave changes. 06/15/2011   ASSESSMENT AND PLAN

## 2011-06-15 NOTE — Assessment & Plan Note (Signed)
We again talked about stopping smoking. She has no plans to do this. She says she has cut back.

## 2011-06-15 NOTE — Patient Instructions (Addendum)
Follow up in 6 months with Dr Percival Spanish.  You will receive a letter in the mail 2 months before you are due.  Please call us when you receive this letter to schedule your follow up appointment.   The current medical regimen is effective;  continue present plan and medications.

## 2011-06-29 ENCOUNTER — Ambulatory Visit: Payer: Self-pay | Admitting: Cardiology

## 2011-07-14 ENCOUNTER — Encounter: Payer: Self-pay | Admitting: Cardiology

## 2011-07-19 HISTORY — PX: SPINE SURGERY: SHX786

## 2011-08-09 ENCOUNTER — Encounter: Payer: Self-pay | Admitting: Cardiology

## 2011-10-05 ENCOUNTER — Encounter: Payer: Self-pay | Admitting: Cardiology

## 2011-10-10 ENCOUNTER — Ambulatory Visit: Payer: Medicare Other | Admitting: Cardiology

## 2011-10-26 ENCOUNTER — Encounter: Payer: Self-pay | Admitting: Cardiology

## 2011-10-26 ENCOUNTER — Ambulatory Visit (INDEPENDENT_AMBULATORY_CARE_PROVIDER_SITE_OTHER): Payer: Medicare Other | Admitting: Cardiology

## 2011-10-26 VITALS — BP 130/80 | HR 101 | Ht 63.0 in | Wt 163.0 lb

## 2011-10-26 DIAGNOSIS — Z01810 Encounter for preprocedural cardiovascular examination: Secondary | ICD-10-CM

## 2011-10-26 DIAGNOSIS — F17200 Nicotine dependence, unspecified, uncomplicated: Secondary | ICD-10-CM

## 2011-10-26 DIAGNOSIS — I10 Essential (primary) hypertension: Secondary | ICD-10-CM

## 2011-10-26 DIAGNOSIS — I4891 Unspecified atrial fibrillation: Secondary | ICD-10-CM

## 2011-10-26 NOTE — Assessment & Plan Note (Signed)
We have discussed stopped smoking on multiple occasions. She is cutting back but are not fully she will ever quit.

## 2011-10-26 NOTE — Assessment & Plan Note (Signed)
The blood pressure is at target. No change in medications is indicated. We will continue with therapeutic lifestyle changes (TLC).  

## 2011-10-26 NOTE — Assessment & Plan Note (Signed)
The patient has had no new symptoms since her stress test previously.  Therefore, based on ACC/AHA guidelines, the patient would be at acceptable risk for the planned procedure without further cardiovascular testing.

## 2011-10-26 NOTE — Patient Instructions (Signed)
The current medical regimen is effective;  continue present plan and medications.  Follow up in 1 year with Dr Hochrein.  You will receive a letter in the mail 2 months before you are due.  Please call us when you receive this letter to schedule your follow up appointment.  

## 2011-10-26 NOTE — Assessment & Plan Note (Signed)
The patient  tolerates this rhythm and rate control and anticoagulation. We will continue with the meds as listed.  I

## 2011-10-26 NOTE — Progress Notes (Signed)
HPI The patient presents for followup of atrial fibrillation. A Holter monitor earlier this year demonstrated many 3 second pauses and heart rates as low as 20 typically while she was asleep. Her metoprolol and digoxin were discontinued.  Follow up Holter demonstrated no further pauses.  She now presents for preoperative clearance prior to having back surgery. She did have a dobutamine echocardiogram last year prior to surgery. I have reviewed this result. There was no evidence of ischemia or infarct. She had a well preserved ejection fraction. In the past year she has had no new symptoms. She denies any chest pressure, neck or arm discomfort. She will occasionally notice her heart increased at night but she has no presyncope or syncope. She has chronic dyspnea with exertion but no PND or orthopnea.  She continues to smoke though she has cut back. She has been very limited by her back pain.    No Known Allergies  Current Outpatient Prescriptions  Medication Sig Dispense Refill  . atorvastatin (LIPITOR) 80 MG tablet Take 80 mg by mouth daily.        . Fluticasone-Salmeterol (ADVAIR) 250-50 MCG/DOSE AEPB Inhale 1 puff into the lungs every 12 (twelve) hours.      . Multiple Vitamins-Minerals (CENTRUM SILVER PO) Take 1 tablet by mouth daily.        . NON FORMULARY Oxygen 3L/min       . omega-3 acid ethyl esters (LOVAZA) 1 G capsule Take 2 g by mouth 2 (two) times daily.      Marland Kitchen oxyCODONE-acetaminophen (PERCOCET) 5-325 MG per tablet Take 2 tablets by mouth every 8 (eight) hours as needed.        Marland Kitchen PARoxetine (PAXIL) 40 MG tablet Take 40 mg by mouth daily.        Marland Kitchen torsemide (DEMADEX) 20 MG tablet Take 20 mg by mouth daily. Pt only takes maybe twice/week       . warfarin (COUMADIN) 6 MG tablet Take 6 mg by mouth as directed.          Past Medical History  Diagnosis Date  . HLD (hyperlipidemia)   . Permanent atrial fibrillation   . HTN (hypertension)     mild; pulmonary  . Diastolic  dysfunction     noted on echo w/some apparent volume issues in past but not severe   . Tobacco abuse   . COPD (chronic obstructive pulmonary disease)     spirometry 04/20/10 FEV1 1.38 (58%) with ration 70  . Uterine cancer     Past Surgical History  Procedure Date  . Abdominal hysterectomy     abd?  . Cervical disc surgery     x2    ROS:  As stated in the HPI and negative for all other systems.  PHYSICAL EXAM BP 130/80  Pulse 101  Ht 5\' 3"  (1.6 m)  Wt 163 lb (73.936 kg)  BMI 28.87 kg/m2 GENERAL:  Well appearing HEENT:  Pupils equal round and reactive, fundi not visualized, oral mucosa unremarkable NECK:  No jugular venous distention, waveform within normal limits, carotid upstroke brisk and symmetric, no bruits, no thyromegaly LYMPHATICS:  No cervical, inguinal adenopathy LUNGS:  Scattered rhonchi BACK:  No CVA tenderness CHEST:  Unremarkable HEART:  PMI not displaced or sustained,S1 and S2 within normal limits, no S3, no S4, no clicks, no rubs, no murmurs, irregular ABD:  Flat, positive bowel sounds normal in frequency in pitch, no bruits, no rebound, no guarding, no midline pulsatile mass, no hepatomegaly, no  splenomegaly EXT:  2 plus pulses throughout, no edema, no cyanosis no clubbing SKIN:  No rashes no nodules NEURO:  Cranial nerves II through XII grossly intact, motor grossly intact throughout PSYCH:  Cognitively intact, oriented to person place and time  EKG:  Atrial fibrillation rate, 101, left axis deviation, right bundle branch block, left anterior fascicular block, nonspecific anterior T wave changes. 10/26/2011  ASSESSMENT AND PLAN

## 2011-11-08 ENCOUNTER — Ambulatory Visit: Payer: Medicare Other | Admitting: Physician Assistant

## 2011-12-13 ENCOUNTER — Encounter (HOSPITAL_BASED_OUTPATIENT_CLINIC_OR_DEPARTMENT_OTHER): Payer: Medicare Other | Attending: General Surgery

## 2011-12-13 DIAGNOSIS — R234 Changes in skin texture: Secondary | ICD-10-CM | POA: Insufficient documentation

## 2011-12-13 DIAGNOSIS — Z7901 Long term (current) use of anticoagulants: Secondary | ICD-10-CM | POA: Insufficient documentation

## 2011-12-13 DIAGNOSIS — Z79899 Other long term (current) drug therapy: Secondary | ICD-10-CM | POA: Insufficient documentation

## 2011-12-13 DIAGNOSIS — X58XXXA Exposure to other specified factors, initial encounter: Secondary | ICD-10-CM | POA: Insufficient documentation

## 2011-12-13 DIAGNOSIS — S7000XA Contusion of unspecified hip, initial encounter: Secondary | ICD-10-CM | POA: Insufficient documentation

## 2011-12-13 DIAGNOSIS — E785 Hyperlipidemia, unspecified: Secondary | ICD-10-CM | POA: Insufficient documentation

## 2011-12-13 DIAGNOSIS — Z8542 Personal history of malignant neoplasm of other parts of uterus: Secondary | ICD-10-CM | POA: Insufficient documentation

## 2011-12-13 DIAGNOSIS — Z923 Personal history of irradiation: Secondary | ICD-10-CM | POA: Insufficient documentation

## 2011-12-13 DIAGNOSIS — I4891 Unspecified atrial fibrillation: Secondary | ICD-10-CM | POA: Insufficient documentation

## 2011-12-13 NOTE — H&P (Signed)
Melinda Collins, Melinda Collins            ACCOUNT NO.:  [id number]  MEDICAL RECORD NO.:  [id number]  LOCATION:  FOOT                         FACILITY:  [facility]  PHYSICIAN:  Elesa Hacker, M.D.        DATE OF BIRTH:  05-16-1941  DATE OF ADMISSION:  12/13/2011 DATE OF DISCHARGE:                             HISTORY & PHYSICAL   CHIEF COMPLAINT:  Wound, left hip.  HISTORY OF PRESENT ILLNESS:  This is a 71 year old female, coumadinized for atrial fibrillation, who evidently was over-coumadinized, developed a large bruise on her left side either spontaneously with minor trauma and as the bruise began to disappear, she was left with a sizable eschar.  PAST MEDICAL HISTORY: 1. Hyperlipidemia. 2. Atrial fibrillation. 3. Cancer of the uterus and radiation treatment and surgery.  PAST SURGICAL HISTORY:  Back surgery, hysterectomy, neck surgery.  SOCIAL HISTORY:  Cigarettes; she was relatively heavy smoker until 2 months ago.  Alcohol none.  ALLERGIES:  None.  MEDICATIONS:  Lipitor, Advair, Paxil, Coumadin, Demadex, and oxycodone. She says she has no pain medicine.  REVIEW OF SYSTEMS:  As above.  PHYSICAL EXAMINATION:  VITAL SIGNS:  Temperature 98.2, pulse 98 and irregular, respirations 20, blood pressure 121/80. GENERAL:  Well developed, well nourished, in no distress. HEAD, EYES, EARS, NOSE, THROAT:  Normal to cursory examination. CHEST:  Clear. HEART:  Has no murmur, but is irregular in rate. EXTREMITIES:  Examination of the left hip reveals a full-thickness eschar 3.8 x 11.5.  This is too adherent to be excised at present.  IMPRESSION:  Probably traumatic wound, made worse by the Coumadin hematoma.  PLAN OF TREATMENT:  Start with Santyl and we will excise the eschar when it separates.  We will see her in 7 days.     Elesa Hacker, M.D.     RA/MEDQ  D:  12/13/2011  T:  12/13/2011  Job:  [id number]

## 2011-12-20 ENCOUNTER — Encounter (HOSPITAL_BASED_OUTPATIENT_CLINIC_OR_DEPARTMENT_OTHER): Payer: Medicare Other | Attending: General Surgery

## 2011-12-20 DIAGNOSIS — S71009A Unspecified open wound, unspecified hip, initial encounter: Secondary | ICD-10-CM | POA: Insufficient documentation

## 2011-12-20 DIAGNOSIS — I4891 Unspecified atrial fibrillation: Secondary | ICD-10-CM | POA: Insufficient documentation

## 2011-12-20 DIAGNOSIS — E785 Hyperlipidemia, unspecified: Secondary | ICD-10-CM | POA: Insufficient documentation

## 2011-12-20 DIAGNOSIS — Z7901 Long term (current) use of anticoagulants: Secondary | ICD-10-CM | POA: Insufficient documentation

## 2011-12-20 DIAGNOSIS — X58XXXA Exposure to other specified factors, initial encounter: Secondary | ICD-10-CM | POA: Insufficient documentation

## 2011-12-20 DIAGNOSIS — S71109A Unspecified open wound, unspecified thigh, initial encounter: Secondary | ICD-10-CM | POA: Insufficient documentation

## 2011-12-20 DIAGNOSIS — Z79899 Other long term (current) drug therapy: Secondary | ICD-10-CM | POA: Insufficient documentation

## 2012-01-10 ENCOUNTER — Encounter (HOSPITAL_BASED_OUTPATIENT_CLINIC_OR_DEPARTMENT_OTHER): Payer: Medicare Other

## 2012-01-18 ENCOUNTER — Encounter (HOSPITAL_BASED_OUTPATIENT_CLINIC_OR_DEPARTMENT_OTHER): Payer: Medicare Other | Attending: General Surgery

## 2012-01-18 DIAGNOSIS — S71009A Unspecified open wound, unspecified hip, initial encounter: Secondary | ICD-10-CM | POA: Insufficient documentation

## 2012-01-18 DIAGNOSIS — E785 Hyperlipidemia, unspecified: Secondary | ICD-10-CM | POA: Insufficient documentation

## 2012-01-18 DIAGNOSIS — X58XXXA Exposure to other specified factors, initial encounter: Secondary | ICD-10-CM | POA: Insufficient documentation

## 2012-01-18 DIAGNOSIS — Z7901 Long term (current) use of anticoagulants: Secondary | ICD-10-CM | POA: Insufficient documentation

## 2012-01-18 DIAGNOSIS — S71109A Unspecified open wound, unspecified thigh, initial encounter: Secondary | ICD-10-CM | POA: Insufficient documentation

## 2012-01-18 DIAGNOSIS — Z79899 Other long term (current) drug therapy: Secondary | ICD-10-CM | POA: Insufficient documentation

## 2012-01-18 DIAGNOSIS — L8990 Pressure ulcer of unspecified site, unspecified stage: Secondary | ICD-10-CM | POA: Insufficient documentation

## 2012-01-18 DIAGNOSIS — L89209 Pressure ulcer of unspecified hip, unspecified stage: Secondary | ICD-10-CM | POA: Insufficient documentation

## 2012-01-18 DIAGNOSIS — I4891 Unspecified atrial fibrillation: Secondary | ICD-10-CM | POA: Insufficient documentation

## 2012-02-22 ENCOUNTER — Encounter (HOSPITAL_BASED_OUTPATIENT_CLINIC_OR_DEPARTMENT_OTHER): Payer: Medicare Other

## 2012-02-22 ENCOUNTER — Encounter (HOSPITAL_BASED_OUTPATIENT_CLINIC_OR_DEPARTMENT_OTHER): Payer: Medicare Other | Attending: General Surgery

## 2012-02-22 DIAGNOSIS — L98499 Non-pressure chronic ulcer of skin of other sites with unspecified severity: Secondary | ICD-10-CM | POA: Insufficient documentation

## 2012-03-21 ENCOUNTER — Encounter (HOSPITAL_BASED_OUTPATIENT_CLINIC_OR_DEPARTMENT_OTHER): Payer: Medicare Other | Attending: General Surgery

## 2012-03-21 DIAGNOSIS — L98499 Non-pressure chronic ulcer of skin of other sites with unspecified severity: Secondary | ICD-10-CM | POA: Insufficient documentation

## 2012-04-18 ENCOUNTER — Encounter (HOSPITAL_BASED_OUTPATIENT_CLINIC_OR_DEPARTMENT_OTHER): Payer: Medicare Other

## 2012-06-20 ENCOUNTER — Encounter: Payer: Self-pay | Admitting: Cardiology

## 2012-06-20 ENCOUNTER — Ambulatory Visit (INDEPENDENT_AMBULATORY_CARE_PROVIDER_SITE_OTHER): Payer: Medicaid Other | Admitting: Cardiology

## 2012-06-20 VITALS — BP 118/77 | HR 90 | Ht 64.0 in | Wt 164.0 lb

## 2012-06-20 DIAGNOSIS — R079 Chest pain, unspecified: Secondary | ICD-10-CM

## 2012-06-20 DIAGNOSIS — I4891 Unspecified atrial fibrillation: Secondary | ICD-10-CM

## 2012-06-20 DIAGNOSIS — R0989 Other specified symptoms and signs involving the circulatory and respiratory systems: Secondary | ICD-10-CM

## 2012-06-20 DIAGNOSIS — R0609 Other forms of dyspnea: Secondary | ICD-10-CM

## 2012-06-20 DIAGNOSIS — F17200 Nicotine dependence, unspecified, uncomplicated: Secondary | ICD-10-CM

## 2012-06-20 DIAGNOSIS — I10 Essential (primary) hypertension: Secondary | ICD-10-CM

## 2012-06-20 NOTE — Progress Notes (Signed)
HPI The patient presents for followup of atrial fibrillation and for preoperative clearance.  I saw her in spring and she was planning to have surgery .  However, she was hospitalized around that time.  I have reviewed these records. She had abdominal wall hematoma. She was apparently supratherapeutic on her warfarin. She mentioned having some confusion around that time but I don't see any mention of this in the hospital notes. She says since then before then she was having a problem with her warfarin dosing.  She now presents for follow up.  She is planning again to see Dr. Carloyn Manner about rescheduling the back surgery. She did have a dobutamine echocardiogram last year which demonstrated no evidence of ischemia or infarct. She had a well preserved ejection fraction. since that time she has had no new cardiovascular symptoms. She denies any chest pressure, neck or arm discomfort. She will occasionally notice her heart increased at night but she has no presyncope or syncope. She has chronic dyspnea with exertion but no PND or orthopnea. She has been very limited by her back pain.  She had stopped smoking for 3 months but she said she restarted this because she became bored.    No Known Allergies  Current Outpatient Prescriptions  Medication Sig Dispense Refill  . atorvastatin (LIPITOR) 80 MG tablet OH HOLD DUE TO LEG PAIN      . Fluticasone-Salmeterol (ADVAIR) 250-50 MCG/DOSE AEPB Inhale 1 puff into the lungs every 12 (twelve) hours.      . Multiple Vitamins-Minerals (CENTRUM SILVER PO) Take 1 tablet by mouth daily.        . NON FORMULARY Oxygen 3L/min       . omega-3 acid ethyl esters (LOVAZA) 1 G capsule Take 2 g by mouth 2 (two) times daily.      Marland Kitchen oxyCODONE-acetaminophen (PERCOCET) 5-325 MG per tablet Take 2 tablets by mouth every 8 (eight) hours as needed.        Marland Kitchen PARoxetine (PAXIL) 40 MG tablet Take 40 mg by mouth daily.        Marland Kitchen torsemide (DEMADEX) 20 MG tablet Take 20 mg by mouth daily. Pt  only takes maybe twice/week       . warfarin (COUMADIN) 6 MG tablet Take 6 mg by mouth as directed.          Past Medical History  Diagnosis Date  . HLD (hyperlipidemia)   . Permanent atrial fibrillation   . HTN (hypertension)     mild; pulmonary  . Diastolic dysfunction     noted on echo w/some apparent volume issues in past but not severe   . Tobacco abuse   . COPD (chronic obstructive pulmonary disease)     spirometry 04/20/10 FEV1 1.38 (58%) with ration 70  . Uterine cancer     Past Surgical History  Procedure Date  . Abdominal hysterectomy     abd?  . Cervical disc surgery     x2    ROS:  As stated in the HPI and negative for all other systems.  PHYSICAL EXAM BP 118/77  Pulse 90  Ht 5\' 4"  (1.626 m)  Wt 164 lb (74.39 kg)  BMI 28.15 kg/m2 GENERAL:  Well appearing HEENT:  Pupils equal round and reactive, fundi not visualized, oral mucosa unremarkable NECK:  No jugular venous distention, waveform within normal limits, carotid upstroke brisk and symmetric, no bruits, no thyromegaly LYMPHATICS:  No cervical, inguinal adenopathy LUNGS:  Scattered rhonchi BACK:  No CVA tenderness CHEST:  Unremarkable HEART:  PMI not displaced or sustained,S1 and S2 within normal limits, no S3, no S4, no clicks, no rubs, no murmurs, irregular ABD:  Flat, positive bowel sounds normal in frequency in pitch, no bruits, no rebound, no guarding, no midline pulsatile mass, no hepatomegaly, no splenomegaly EXT:  2 plus pulses throughout, no edema, no cyanosis no clubbing SKIN:  No rashes no nodules NEURO:  Cranial nerves II through XII grossly intact, motor grossly intact throughout PSYCH:  Cognitively intact, oriented to person place and time  EKG:  Atrial fibrillation rate, 91, left axis deviation, right bundle branch block, left anterior fascicular block, nonspecific anterior T wave changes.  No change from previous.  06/20/2012  ASSESSMENT AND PLAN  Preop cardiovascular exam -  Again at  this visit the patient has had no new symptoms since her stress test previously. Therefore, based on ACC/AHA guidelines, the patient would be at acceptable risk for the planned procedure without further cardiovascular testing. I do think her most significant risk might be pulmonary given her ongoing tobacco previous COPD history.  Atrial fibrillation -  The patient tolerates this rhythm and rate control and anticoagulation. We will continue with the meds as listed. She could hold her warfarin as needed prior to surgery without bridging anticoagulation.  TOBACCO ABUSE -  We have again discussed the need to stop smoking . She is planning on using the electronic cigarettes and I discussed the precautions necessary with this.  Hypertension -  The blood pressure is at target. No change in medications is indicated. We will continue with therapeutic lifestyle changes (TLC).

## 2012-06-20 NOTE — Patient Instructions (Addendum)
The current medical regimen is effective;  continue present plan and medications.  Follow up in 1 year with Dr Hochrein.  You will receive a letter in the mail 2 months before you are due.  Please call us when you receive this letter to schedule your follow up appointment.  

## 2012-10-18 ENCOUNTER — Telehealth: Payer: Self-pay | Admitting: *Deleted

## 2012-10-18 NOTE — Telephone Encounter (Signed)
Med Levoquin 500 mg one po QD x 7 days #7 0RF called into [facility] in [place], [place]- per dwm- TO/Yalda Herd Kuenzi, lpn

## 2012-10-22 ENCOUNTER — Other Ambulatory Visit: Payer: Self-pay | Admitting: *Deleted

## 2012-10-22 MED ORDER — WARFARIN SODIUM 5 MG PO TABS: 5.0000 mg | ORAL_TABLET | Freq: Every day | ORAL | Status: DC

## 2012-10-22 MED ORDER — PAROXETINE HCL 40 MG PO TABS: 40.0000 mg | ORAL_TABLET | Freq: Every day | ORAL | Status: DC

## 2012-10-23 ENCOUNTER — Telehealth: Payer: Self-pay | Admitting: *Deleted

## 2012-10-23 ENCOUNTER — Telehealth: Payer: Self-pay | Admitting: Physician Assistant

## 2012-10-23 NOTE — Telephone Encounter (Signed)
Needs protime.  Daughter-in-law is a Marine scientist and has been monitoring it at home.  Last read was 2.9.  She hasn't been seen in our office since 09/24/12.  Recently discharged from [facility] for pneumonia.  Records obtained.  No appts available this afternoon.  Patient did not want to wait while I made her an appt but said that I could leave a message on her voicemail if she did not answer.  Appt scheduled for tomorrow morning for protime.  Left message on patient's voicemail for her return my call.

## 2012-10-23 NOTE — Telephone Encounter (Signed)
Appt changed to 1:00 pm tomorrow.  Pt aware.

## 2012-10-24 ENCOUNTER — Ambulatory Visit (INDEPENDENT_AMBULATORY_CARE_PROVIDER_SITE_OTHER): Payer: Medicare Other | Admitting: Pharmacist

## 2012-10-24 DIAGNOSIS — I4891 Unspecified atrial fibrillation: Secondary | ICD-10-CM

## 2012-10-24 NOTE — Patient Instructions (Signed)
Anticoagulation Dose Instructions as of 10/24/2012     Dorene Grebe Tue Wed Thu Fri Sat   New Dose 7.5 mg 10 mg 10 mg 10 mg 7.5 mg 10 mg 10 mg    Description       Continue same dose.        INR = 2.9 today

## 2012-11-05 ENCOUNTER — Ambulatory Visit (INDEPENDENT_AMBULATORY_CARE_PROVIDER_SITE_OTHER): Payer: Medicare Other | Admitting: Pharmacist

## 2012-11-05 ENCOUNTER — Encounter: Payer: Self-pay | Admitting: Pharmacist

## 2012-11-05 DIAGNOSIS — I4891 Unspecified atrial fibrillation: Secondary | ICD-10-CM

## 2012-11-05 NOTE — Patient Instructions (Signed)
Anticoagulation Dose Instructions as of 11/05/2012     Dorene Grebe Tue Wed Thu Fri Sat   New Dose 7.5 mg 10 mg 10 mg 10 mg 7.5 mg 10 mg 10 mg    Description       Continue same dose.       INR was 2.5 today

## 2012-11-12 ENCOUNTER — Ambulatory Visit (INDEPENDENT_AMBULATORY_CARE_PROVIDER_SITE_OTHER): Payer: Medicare Other | Admitting: Family Medicine

## 2012-11-12 ENCOUNTER — Encounter: Payer: Self-pay | Admitting: Family Medicine

## 2012-11-12 VITALS — BP 127/89 | HR 93 | Temp 97.2°F | Ht 63.5 in | Wt 158.6 lb

## 2012-11-12 DIAGNOSIS — R5383 Other fatigue: Secondary | ICD-10-CM

## 2012-11-12 DIAGNOSIS — J438 Other emphysema: Secondary | ICD-10-CM

## 2012-11-12 DIAGNOSIS — E785 Hyperlipidemia, unspecified: Secondary | ICD-10-CM

## 2012-11-12 DIAGNOSIS — I10 Essential (primary) hypertension: Secondary | ICD-10-CM

## 2012-11-12 DIAGNOSIS — J439 Emphysema, unspecified: Secondary | ICD-10-CM

## 2012-11-12 LAB — THYROID PANEL WITH TSH
Free Thyroxine Index: 2.9 (ref 1.0–3.9)
TSH: 1.342 u[IU]/mL (ref 0.350–4.500)

## 2012-11-12 LAB — POCT CBC
Lymph, poc: 2.5 (ref 0.6–3.4)
MCH, POC: 29.5 pg (ref 27–31.2)
MCHC: 33.4 g/dL (ref 31.8–35.4)
MPV: 8.7 fL (ref 0–99.8)
POC Granulocyte: 7.1 — AB (ref 2–6.9)
POC LYMPH PERCENT: 24.5 %L (ref 10–50)
Platelet Count, POC: 199 10*3/uL (ref 142–424)

## 2012-11-12 LAB — HEPATIC FUNCTION PANEL
Albumin: 4.2 g/dL (ref 3.5–5.2)
Total Protein: 6.3 g/dL (ref 6.0–8.3)

## 2012-11-12 NOTE — Patient Instructions (Addendum)
Continue current meds and therapeutic lifestyle changes Stay off of cigarettes Try to walk and stay as active as possible Drink plenty of water Take Mucinex as needed for chest congestion Tried Nasacort AQ over-the-counter 1-2 sprays each nostril

## 2012-11-12 NOTE — Progress Notes (Signed)
Subjective:    Patient ID: Melinda Collins, female    DOB: 12/26/1940, 72 y.o.   MRN: [id number]  HPI This patient presents for recheck of multiple medical problems. NO one accompanies the patient today. She continues to have severe back pain.  Patient Active Problem List   Diagnosis Date Noted  . A-fib 10/24/2012  . Preop cardiovascular exam 10/26/2011  . Hypertension 05/17/2011  . DYSPNEA 04/20/2010  . CHEST PAIN 10/08/2009  . HYPERLIPIDEMIA 02/10/2009  . TOBACCO ABUSE 02/10/2009  . 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 Atrial fibrillation 02/10/2009    In addition, recent bout of bronchitis and took antibiotics. Last cigarette smoking was about 3 weeks ago. Dr. Carloyn Manner he says he is not to do surgery until she has been off of cigarettes for 3 months. She is using electronic cigarette.  The allergies, current medications, past medical history, surgical history, family and social history are reviewed.  Immunizations reviewed.  Health maintenance reviewed.  The following items are outstanding: none.      Review of Systems  Constitutional: Positive for fatigue. Negative for fever.  HENT: Positive for ear pain, congestion and postnasal drip.   Eyes: Positive for itching (R>L).  Respiratory: Positive for cough (prod), chest tightness and wheezing.   Cardiovascular: Positive for chest pain.  Gastrointestinal: Negative.   Endocrine: Negative.   Genitourinary: Negative.   Musculoskeletal: Positive for myalgias (L arm), back pain and arthralgias (all over).  Neurological: Positive for dizziness (occasional), numbness (bilateral hands when lying down) and headaches.  Psychiatric/Behavioral: Positive for sleep disturbance (due to back pain). The patient is nervous/anxious (due to back pain).        Objective:   Physical Exam  BP 127/89  Pulse 93  Temp(Src) 97.2 F (36.2 C) (Oral)  Ht 5' 3.5" (1.613 m)  Wt 158 lb 9.6 oz (71.94 kg)  BMI 27.65 kg/m2  The patient appeared  well nourished and normally developed, alert and oriented to time and place. Speech, behavior and judgement appear normal. Vital signs as documented.  Head exam is unremarkable. No scleral icterus or pallor noted.  Neck is without jugular venous distension, thyromegally, or carotid bruits. Carotid upstrokes are brisk bilaterally. No cervical adenopathy. Lungs are clear anteriorly and posteriorly to auscultation, but when asked to cough she has a very tight cough. Normal respiratory effort. Cardiac exam reveals irregular irregular rate and rhythm at 84 per minute. First and second heart sounds normal. No murmurs, rubs or gallops.  Abdominal exam reveals normal bowl sounds, no masses, no organomegaly and no aortic enlargement. No inguinal adenopathy. Extremities are nonedematous. Leg raising was done without pain bilaterally. She did however experience pain with laying down and sitting up in her back. Skin without pallor or jaundice.  Warm and dry, without rash. Neurologic exam reveals normal deep tendon reflexes and normal sensation. Reflexes were 2+ and equal bilaterally         Assessment & Plan:  1. Fatigue - POCT CBC; Standing - Thyroid Panel With TSH - Vitamin D 25 hydroxy; Standing  2. Essential hypertension, benign - BASIC METABOLIC PANEL WITH GFR; Standing  3. Hyperlipidemia - Hepatic function panel; Standing - NMR Lipoprofile with Lipids; Standing  4.Nicotine abuse Has been off cigarettes for about 3 weight  5. Chronic back pain Surgery planned for July of 2014  Patient Instructions  Continue current meds and therapeutic lifestyle changes Stay off of cigarettes Try to walk and stay as active as possible Drink plenty of water Take Mucinex  as needed for chest congestion Tried Nasacort AQ over-the-counter 1-2 sprays each nostril

## 2012-11-13 LAB — BASIC METABOLIC PANEL WITH GFR
BUN: 38 mg/dL — ABNORMAL HIGH (ref 6–23)
Chloride: 105 mEq/L (ref 96–112)
GFR, Est African American: 37 mL/min — ABNORMAL LOW
GFR, Est Non African American: 32 mL/min — ABNORMAL LOW
Glucose, Bld: 86 mg/dL (ref 70–99)
Potassium: 4.1 mEq/L (ref 3.5–5.3)
Sodium: 139 mEq/L (ref 135–145)

## 2012-11-13 LAB — NMR LIPOPROFILE WITH LIPIDS
HDL-C: 44 mg/dL (ref >=40)
LDL (calc): 54 mg/dL (ref <100)
LDL Particle Number: 951 nmol/L (ref <1000)
Large HDL-P: 4.8 umol/L (ref >=4.8)
Large VLDL-P: 4.9 nmol/L — ABNORMAL HIGH (ref <=2.7)
Small LDL Particle Number: 658 nmol/L — ABNORMAL HIGH (ref <=527)

## 2012-11-15 ENCOUNTER — Telehealth: Payer: Self-pay | Admitting: *Deleted

## 2012-11-15 NOTE — Telephone Encounter (Signed)
Message copied by Marin Olp on Thu Nov 15, 2012  4:51 PM ------      Message from: Chipper Herb      Created: Tue Nov 13, 2012  9:23 PM       Continue aggressive therapy lifestyle changes ------

## 2012-11-15 NOTE — Telephone Encounter (Signed)
Pt notified of results

## 2012-11-21 ENCOUNTER — Other Ambulatory Visit: Payer: Self-pay

## 2012-11-21 MED ORDER — PAROXETINE HCL 40 MG PO TABS: 40.0000 mg | ORAL_TABLET | Freq: Every day | ORAL | Status: DC

## 2012-11-21 NOTE — Telephone Encounter (Signed)
Patient was told on refill of 10/22/12 that needed to be seen before next refill  Next visit in computer 03/25/13

## 2012-11-26 ENCOUNTER — Ambulatory Visit (INDEPENDENT_AMBULATORY_CARE_PROVIDER_SITE_OTHER): Payer: Medicare Other | Admitting: Pharmacist Clinician (PhC)/ Clinical Pharmacy Specialist

## 2012-11-26 DIAGNOSIS — I4891 Unspecified atrial fibrillation: Secondary | ICD-10-CM

## 2012-11-26 DIAGNOSIS — R739 Hyperglycemia, unspecified: Secondary | ICD-10-CM

## 2012-11-26 DIAGNOSIS — R7309 Other abnormal glucose: Secondary | ICD-10-CM

## 2012-12-12 ENCOUNTER — Ambulatory Visit (INDEPENDENT_AMBULATORY_CARE_PROVIDER_SITE_OTHER): Payer: Medicare Other | Admitting: Pharmacist

## 2012-12-12 DIAGNOSIS — I4891 Unspecified atrial fibrillation: Secondary | ICD-10-CM

## 2012-12-12 LAB — POCT INR: INR: 2.6

## 2012-12-27 ENCOUNTER — Ambulatory Visit (INDEPENDENT_AMBULATORY_CARE_PROVIDER_SITE_OTHER): Payer: Medicare Other | Admitting: Pharmacist

## 2012-12-27 ENCOUNTER — Telehealth: Payer: Self-pay | Admitting: Family Medicine

## 2012-12-27 DIAGNOSIS — I4891 Unspecified atrial fibrillation: Secondary | ICD-10-CM

## 2012-12-27 LAB — POCT INR: INR: 3

## 2012-12-27 MED ORDER — WARFARIN SODIUM 5 MG PO TABS: 5.0000 mg | ORAL_TABLET | Freq: Every day | ORAL | Status: DC

## 2012-12-27 MED ORDER — PAROXETINE HCL 40 MG PO TABS: 40.0000 mg | ORAL_TABLET | Freq: Every day | ORAL | Status: DC

## 2012-12-27 NOTE — Patient Instructions (Signed)
Anticoagulation Dose Instructions as of 12/27/2012     Melinda Collins Tue Wed Thu Fri Sat   New Dose 7.5 mg 10 mg 10 mg 10 mg 7.5 mg 10 mg 10 mg    Description       Continue same dose.         INR was 3.0 today

## 2012-12-27 NOTE — Telephone Encounter (Signed)
Refills completed - called to [facility] Patient aware.

## 2012-12-31 ENCOUNTER — Other Ambulatory Visit: Payer: Self-pay | Admitting: *Deleted

## 2012-12-31 MED ORDER — ATORVASTATIN CALCIUM 80 MG PO TABS: 80.0000 mg | ORAL_TABLET | Freq: Every day | ORAL | Status: DC

## 2013-01-08 ENCOUNTER — Telehealth: Payer: Self-pay | Admitting: Family Medicine

## 2013-01-08 ENCOUNTER — Encounter: Payer: Self-pay | Admitting: General Practice

## 2013-01-08 ENCOUNTER — Ambulatory Visit (INDEPENDENT_AMBULATORY_CARE_PROVIDER_SITE_OTHER): Payer: Medicare Other

## 2013-01-08 ENCOUNTER — Ambulatory Visit (INDEPENDENT_AMBULATORY_CARE_PROVIDER_SITE_OTHER): Payer: Medicare Other | Admitting: General Practice

## 2013-01-08 VITALS — BP 121/80 | HR 102 | Temp 97.9°F | Ht 63.5 in | Wt 160.0 lb

## 2013-01-08 DIAGNOSIS — M25519 Pain in unspecified shoulder: Secondary | ICD-10-CM

## 2013-01-08 DIAGNOSIS — M25512 Pain in left shoulder: Secondary | ICD-10-CM

## 2013-01-08 DIAGNOSIS — L0291 Cutaneous abscess, unspecified: Secondary | ICD-10-CM

## 2013-01-08 DIAGNOSIS — M79609 Pain in unspecified limb: Secondary | ICD-10-CM

## 2013-01-08 DIAGNOSIS — M79602 Pain in left arm: Secondary | ICD-10-CM

## 2013-01-08 DIAGNOSIS — L0390 Cellulitis, unspecified: Secondary | ICD-10-CM

## 2013-01-08 MED ORDER — AMOXICILLIN 500 MG PO CAPS: 500.0000 mg | ORAL_CAPSULE | Freq: Three times a day (TID) | ORAL | Status: DC

## 2013-01-08 NOTE — Progress Notes (Signed)
  Subjective:    Patient ID: Melinda Collins, female    DOB: 08/03/1940, 72 y.o.   MRN: [id number]  Arm Pain  The incident occurred more than 1 week ago. The incident occurred at home. There was no injury mechanism. The pain is present in the left forearm, left elbow and left shoulder. The quality of the pain is described as aching. The pain does not radiate. The pain is at a severity of 8/10. The pain is moderate. The pain has been constant since the incident. Pertinent negatives include no chest pain, muscle weakness or numbness. The symptoms are aggravated by movement. Treatments tried: percocet for back pain.  Patient reports having three back operations and is scheduled to have another one in July 2014. She reports having back pain and weakness in legs due to the back pain. Reports taking percocet as prescribed.  Patient reports remembering she was bitten one night about the time the swelling occurred. She reports seeing a red area on left arm and left upper leg. She denies seeing what had bitten her.     Review of Systems  Constitutional: Negative for fever and chills.  Respiratory: Positive for shortness of breath. Negative for chest tightness.        Shortness of breath, chronic  Cardiovascular: Positive for leg swelling. Negative for chest pain and palpitations.       Left leg swelling  Genitourinary: Negative for flank pain and difficulty urinating.  Musculoskeletal: Positive for gait problem.       Left shoulder and arm swelling   Neurological: Negative for dizziness, weakness, numbness and headaches.       Objective:   Physical Exam  Constitutional: She is oriented to person, place, and time. She appears well-developed and well-nourished.  HENT:  Head: Normocephalic and atraumatic.  Cardiovascular: Tachycardia present.   Pulses:      Radial pulses are 2+ on the right side, and 2+ on the left side.       Dorsalis pedis pulses are 1+ on the right side, and 1+ on the left  side.  Pulmonary/Chest: Effort normal. She has wheezes. She exhibits no tenderness.  Bilateral upper lobe wheezing, expiratory  Abdominal: Soft. Bowel sounds are normal. She exhibits no distension. There is no tenderness.  Musculoskeletal: She exhibits edema and tenderness.  Left arm, shoulder, and upper left leg non-pitting edema. Left arm tenderness with palpation Limited range of motion (90 degree) without having pain  Neurological: She is alert and oriented to person, place, and time.  Skin: Skin is warm and dry.  Left arm edema with warmth to touch.   Psychiatric: She has a normal mood and affect.   [facility] reading (PRIMARY) by Erby Pian, FNP-C, no fracture or dislocation noted.                                       Assessment & Plan:  1. Left arm pain and 2. Left shoulder pain - DG Shoulder Left  3. Cellulitis - amoxicillin (AMOXIL) 500 MG capsule; Take 1 capsule (500 mg total) by mouth 3 (three) times daily.  Dispense: 30 capsule; Refill: 0 -keep area clean and dry -elevate arm and leg on pillow while sitting -RTO on Friday for follow up and sooner if symptoms worsen -Patient verbalized understanding -Erby Pian, FNP-C

## 2013-01-08 NOTE — Patient Instructions (Addendum)
Cellulitis Cellulitis is an infection of the skin and the tissue beneath it. The infected area is usually red and tender. Cellulitis occurs most often in the arms and lower legs.  CAUSES  Cellulitis is caused by bacteria that enter the skin through cracks or cuts in the skin. The most common types of bacteria that cause cellulitis are Staphylococcus and Streptococcus. SYMPTOMS   Redness and warmth.  Swelling.  Tenderness or pain.  Fever. DIAGNOSIS  Your caregiver can usually determine what is wrong based on a physical exam. Blood tests may also be done. TREATMENT  Treatment usually involves taking an antibiotic medicine. HOME CARE INSTRUCTIONS   Take your antibiotics as directed. Finish them even if you start to feel better.  Keep the infected arm or leg elevated to reduce swelling.  Apply a warm cloth to the affected area up to 4 times per day to relieve pain.  Only take over-the-counter or prescription medicines for pain, discomfort, or fever as directed by your caregiver.  Keep all follow-up appointments as directed by your caregiver. SEEK MEDICAL CARE IF:   You notice red streaks coming from the infected area.  Your red area gets larger or turns dark in color.  Your bone or joint underneath the infected area becomes painful after the skin has healed.  Your infection returns in the same area or another area.  You notice a swollen bump in the infected area.  You develop new symptoms. SEEK IMMEDIATE MEDICAL CARE IF:   You have a fever.  You feel very sleepy.  You develop vomiting or diarrhea.  You have a general ill feeling (malaise) with muscle aches and pains. MAKE SURE YOU:   Understand these instructions.  Will watch your condition.  Will get help right away if you are not doing well or get worse. Document Released: 04/13/2005 Document Revised: 01/03/2012 Document Reviewed: 09/19/2011 [employer] Patient Information 2014 [employer].  

## 2013-01-08 NOTE — Telephone Encounter (Signed)
APPT MADE

## 2013-01-11 ENCOUNTER — Telehealth: Payer: Self-pay | Admitting: General Practice

## 2013-01-11 NOTE — Telephone Encounter (Signed)
Patient will call back Monday if no better states she dose not  have the gas money to get here today

## 2013-01-24 ENCOUNTER — Ambulatory Visit (INDEPENDENT_AMBULATORY_CARE_PROVIDER_SITE_OTHER): Payer: Medicare Other | Admitting: Pharmacist

## 2013-01-24 DIAGNOSIS — I4891 Unspecified atrial fibrillation: Secondary | ICD-10-CM

## 2013-01-24 LAB — POCT INR: INR: 3.4

## 2013-01-24 NOTE — Patient Instructions (Signed)
Anticoagulation Dose Instructions as of 01/24/2013     Melinda Collins Tue Wed Thu Fri Sat   New Dose 7.5 mg 10 mg 10 mg 10 mg 7.5 mg 10 mg 10 mg    Description       Take 1/2 tablet today then restart same dose of 1 and 1/2 tablet on sundays and thursdays and 2 tablets on all other days      INR was 3.4 today

## 2013-02-04 ENCOUNTER — Telehealth: Payer: Self-pay | Admitting: Family Medicine

## 2013-02-04 DIAGNOSIS — I4891 Unspecified atrial fibrillation: Secondary | ICD-10-CM

## 2013-02-04 MED ORDER — WARFARIN SODIUM 5 MG PO TABS: ORAL_TABLET | ORAL | Status: DC

## 2013-02-04 NOTE — Telephone Encounter (Signed)
Rx called into [facility].  Patient notified.

## 2013-02-07 ENCOUNTER — Ambulatory Visit (INDEPENDENT_AMBULATORY_CARE_PROVIDER_SITE_OTHER): Payer: Medicare Other | Admitting: Pharmacist

## 2013-02-07 DIAGNOSIS — I4891 Unspecified atrial fibrillation: Secondary | ICD-10-CM

## 2013-02-07 LAB — POCT INR: INR: 2.3

## 2013-02-07 NOTE — Patient Instructions (Addendum)
Anticoagulation Dose Instructions as of 02/07/2013     Melinda Collins Tue Wed Thu Fri Sat   New Dose 7.5 mg 10 mg 10 mg 10 mg 7.5 mg 10 mg 10 mg    Description       Continue same dose of 1 and 1/2 tablet on sundays and thursdays and 2 tablets on all other days      INR was 2.3 today

## 2013-03-07 ENCOUNTER — Telehealth: Payer: Self-pay | Admitting: Family Medicine

## 2013-03-09 NOTE — Telephone Encounter (Signed)
Tammy if you could help with this I would appreciate it. It is okay for her to determine tract when she's properly trained by you. Thanks

## 2013-03-11 NOTE — Telephone Encounter (Signed)
Our office uses [place].  Patient would not be responsible for adjusting warfarin but could check INR at home.  Results are sent to our office for adjustment weekly.  I would be OK with doing this is patient feel comfortable with checking at home.   Tried to contact Melinda Collins to discuss - left message on VM

## 2013-03-14 NOTE — Telephone Encounter (Signed)
Discuss options with patient's daughter in law.  She is OK with using [facility].  I will fill out paperwork to have done.

## 2013-03-15 ENCOUNTER — Telehealth: Payer: Self-pay | Admitting: Family Medicine

## 2013-03-15 NOTE — Telephone Encounter (Signed)
Patient did have appt to check protime 03/04/13 and it was cancelled.  I will discuss with patient at appt 03/23/13.

## 2013-03-25 ENCOUNTER — Ambulatory Visit (INDEPENDENT_AMBULATORY_CARE_PROVIDER_SITE_OTHER): Payer: Medicare Other

## 2013-03-25 ENCOUNTER — Ambulatory Visit (INDEPENDENT_AMBULATORY_CARE_PROVIDER_SITE_OTHER): Payer: Medicare Other | Admitting: Pharmacist

## 2013-03-25 ENCOUNTER — Ambulatory Visit (INDEPENDENT_AMBULATORY_CARE_PROVIDER_SITE_OTHER): Payer: Medicare Other | Admitting: Family Medicine

## 2013-03-25 ENCOUNTER — Encounter: Payer: Self-pay | Admitting: Family Medicine

## 2013-03-25 VITALS — BP 129/89 | HR 84 | Temp 97.6°F | Ht 63.5 in | Wt 160.0 lb

## 2013-03-25 DIAGNOSIS — I2789 Other specified pulmonary heart diseases: Secondary | ICD-10-CM

## 2013-03-25 DIAGNOSIS — R600 Localized edema: Secondary | ICD-10-CM

## 2013-03-25 DIAGNOSIS — I4891 Unspecified atrial fibrillation: Secondary | ICD-10-CM

## 2013-03-25 DIAGNOSIS — R609 Edema, unspecified: Secondary | ICD-10-CM

## 2013-03-25 DIAGNOSIS — I10 Essential (primary) hypertension: Secondary | ICD-10-CM

## 2013-03-25 DIAGNOSIS — E785 Hyperlipidemia, unspecified: Secondary | ICD-10-CM

## 2013-03-25 LAB — POCT CBC
HCT, POC: 47.4 % (ref 37.7–47.9)
Hemoglobin: 15.7 g/dL (ref 12.2–16.2)
Lymph, poc: 2.1 (ref 0.6–3.4)
MCH, POC: 29.9 pg (ref 27–31.2)
MCHC: 33 g/dL (ref 31.8–35.4)
WBC: 9 10*3/uL (ref 4.6–10.2)

## 2013-03-25 NOTE — Progress Notes (Signed)
Charge for INR but not office visit.  Saw Dr Laurance Flatten today

## 2013-03-25 NOTE — Progress Notes (Signed)
Subjective:    Patient ID: Melinda Collins, female    DOB: 05-14-1941, 72 y.o.   MRN: [id number]  HPI Patient comes in today for followup and management of chronic medical problems. These problems include atrial fibrillation, hyperlipidemia, hypertension, and pulmonary hypertension. She also has chronic back pain and is being followed by the neurosurgeon for this. Patient has had increased edema of the left upper extremity and the left lower extremity for a couple of months. Dr. Carloyn Manner evaluated her for a blood clot and nothing was found according to the patient. She was considering doing her protimes at home but she has not started doing that yet. There is no increased shortness of breath. She just has this edema of both left upper and lower extremities with pain with movement.  Review of Systems  Constitutional: Negative.   HENT: Negative.   Eyes: Negative.   Respiratory: Negative.   Cardiovascular: Negative.   Gastrointestinal: Negative.   Endocrine: Negative.   Genitourinary: Negative.   Musculoskeletal: Positive for myalgias, back pain and arthralgias (left arm and left leg pains).  Skin: Negative.   Allergic/Immunologic: Negative.   Neurological: Negative.   Hematological: Negative.   Psychiatric/Behavioral: Positive for agitation.       Objective:   Physical Exam BP 129/89  Pulse 84  Temp(Src) 97.6 F (36.4 C) (Oral)  Ht 5' 3.5" (1.613 m)  Wt 160 lb (72.576 kg)  BMI 27.89 kg/m2  The patient appeared well nourished and normally developed, alert and oriented to time and place. Speech, behavior and judgement appear normal.  Vital signs as documented.  Head exam is unremarkable. No scleral icterus or pallor noted. Ears nose and throat were normal. Neck is without jugular venous distension, thyromegally, or carotid bruits. Carotid upstrokes are brisk bilaterally. No cervical adenopathy. Lungs are clear anteriorly and posteriorly to auscultation. Normal respiratory  effort. Cardiac exam reveals jugular area regular rate and rhythm at 72 per minute. First and second heart sounds normal. No murmurs, rubs or gallops. Breasts exam was done and everything felt normal. There were no axillary masses or breast masses. Abdominal exam reveals normal bowl sounds, no masses, no organomegaly and no aortic enlargement. No inguinal adenopathy. There was no pitting edema of either extremity but there was like a lymphedema of both the upper left extremity and the lower left extremity. Skin without pallor or jaundice.  Warm and dry, without rash. There is no bruising noted.           Assessment & Plan:  1. HYPERLIPIDEMIA - POCT CBC - NMR, lipoprofile  2. PULMONARY HYPERTENSION  3. Atrial fibrillation - POCT CBC  4. Hypertension - POCT CBC - BMP8+EGFR - Hepatic function panel  5. Peripheral edema, nonpiittinng of left upper and lower extremity - DG Chest 2 View; Future  Patient Instructions  Continue medications as doing Continue aggressive therapeutic lifestyle changes Monitor protimes regularly Do not forget to get your flu shot in October Always be careful and tried to prevent the risk of fall We will call you once the lab work and chest x-ray results are back and we will make sure that we send a copy to Dr. Carloyn Manner    Also in doing a further evaluation of this edema problem we will consider getting a CT scan of the chest and abdomen and pelvis if necessary to evaluate any soft tissue problems that could be playing a role with this edema. We will also consider a referral to the vascular surgeon. Once we  review the lab work and  chest x-ray results we will move forward with further evaluation.  Arrie Senate MD

## 2013-03-25 NOTE — Patient Instructions (Addendum)
Continue medications as doing Continue aggressive therapeutic lifestyle changes Monitor protimes regularly Do not forget to get your flu shot in October Always be careful and tried to prevent the risk of fall We will call you once the lab work and chest x-ray results are back and we will make sure that we send a copy to Dr. Carloyn Manner

## 2013-03-25 NOTE — Patient Instructions (Signed)
Anticoagulation Dose Instructions as of 03/25/2013     Sun Mon Tue Wed Thu Fri Sat   New Dose 7.5 mg 10 mg 10 mg 10 mg 7.5 mg 10 mg 10 mg    Description       Continue same dose of 1 and 1/2 tablet on sundays and thursdays and 2 tablets on all other days      INR was 2.5 today

## 2013-03-26 ENCOUNTER — Other Ambulatory Visit: Payer: Self-pay

## 2013-03-26 MED ORDER — PAROXETINE HCL 40 MG PO TABS: 40.0000 mg | ORAL_TABLET | Freq: Every day | ORAL | Status: DC

## 2013-03-26 MED ORDER — ALBUTEROL SULFATE HFA 108 (90 BASE) MCG/ACT IN AERS: 1.0000 | INHALATION_SPRAY | RESPIRATORY_TRACT | Status: DC | PRN

## 2013-03-26 NOTE — Telephone Encounter (Signed)
x

## 2013-03-27 LAB — BMP8+EGFR
BUN/Creatinine Ratio: 19 (ref 11–26)
GFR calc Af Amer: 51 mL/min/{1.73_m2} — ABNORMAL LOW (ref >59)
GFR calc non Af Amer: 44 mL/min/{1.73_m2} — ABNORMAL LOW (ref >59)
Potassium: 4.3 mmol/L (ref 3.5–5.2)
Sodium: 143 mmol/L (ref 134–144)

## 2013-03-27 LAB — HEPATIC FUNCTION PANEL
ALT: 22 IU/L (ref 0–32)
Alkaline Phosphatase: 83 IU/L (ref 39–117)
Bilirubin, Direct: 0.16 mg/dL (ref 0.00–0.40)

## 2013-03-27 LAB — NMR, LIPOPROFILE
Cholesterol: 138 mg/dL (ref <200)
LDL Particle Number: 760 nmol/L (ref <1000)
LDL Size: 20.3 nm — ABNORMAL LOW (ref >20.5)
LP-IR Score: 78 — ABNORMAL HIGH (ref <=45)

## 2013-03-28 ENCOUNTER — Other Ambulatory Visit: Payer: Self-pay | Admitting: *Deleted

## 2013-03-28 DIAGNOSIS — R229 Localized swelling, mass and lump, unspecified: Secondary | ICD-10-CM

## 2013-03-28 DIAGNOSIS — R609 Edema, unspecified: Secondary | ICD-10-CM

## 2013-04-02 ENCOUNTER — Telehealth: Payer: Self-pay | Admitting: Family Medicine

## 2013-04-08 ENCOUNTER — Other Ambulatory Visit: Payer: Self-pay | Admitting: *Deleted

## 2013-04-08 DIAGNOSIS — M7989 Other specified soft tissue disorders: Secondary | ICD-10-CM

## 2013-04-09 ENCOUNTER — Encounter: Payer: Self-pay | Admitting: Family Medicine

## 2013-04-09 ENCOUNTER — Ambulatory Visit (INDEPENDENT_AMBULATORY_CARE_PROVIDER_SITE_OTHER): Payer: Medicare Other | Admitting: Family Medicine

## 2013-04-09 VITALS — BP 120/83 | HR 111 | Temp 98.5°F | Ht 63.5 in | Wt 162.0 lb

## 2013-04-09 DIAGNOSIS — I4891 Unspecified atrial fibrillation: Secondary | ICD-10-CM

## 2013-04-09 DIAGNOSIS — Z23 Encounter for immunization: Secondary | ICD-10-CM

## 2013-04-09 DIAGNOSIS — R609 Edema, unspecified: Secondary | ICD-10-CM

## 2013-04-09 NOTE — Patient Instructions (Addendum)
Anticoagulation Dose Instructions as of 04/09/2013     Melinda Collins Tue Wed Thu Fri Sat   New Dose 7.5 mg 10 mg 10 mg 10 mg 7.5 mg 10 mg 10 mg    Description       Continue same dose of 1 and 1/2 tablet on sundays and thursdays and 2 tablets on all other days     We will continue to work on this appointment with the vascular surgeons and try to get it sooner and possible Continue your fluid pill If worsening of symptoms let us no we will see you sooner

## 2013-04-09 NOTE — Progress Notes (Signed)
  Subjective:    Patient ID: Melinda Collins, female    DOB: [birth year], 72 y.o.   MRN: [id number]  HPI pt here today for follow up of swelling of left arm and leg. Patient continues to have edema and left arm and left leg. She is still taking her fluid pill. She does have an appointment now to see the vascular surgeon.   Patient Active Problem List   Diagnosis Date Noted  . A-fib 10/24/2012  . Preop cardiovascular exam 10/26/2011  . Hypertension 05/17/2011  . DYSPNEA 04/20/2010  . CHEST PAIN 10/08/2009  . HYPERLIPIDEMIA 02/10/2009  . TOBACCO ABUSE 02/10/2009  . 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 Atrial fibrillation 02/10/2009   Outpatient Encounter Prescriptions as of 04/09/2013  Medication Sig Dispense Refill  . albuterol (VENTOLIN HFA) 108 (90 BASE) MCG/ACT inhaler Inhale 1 puff into the lungs every 4 (four) hours as needed for wheezing.  18 g  5  . atorvastatin (LIPITOR) 80 MG tablet Take 1 tablet (80 mg total) by mouth daily.  30 tablet  5  . Multiple Vitamins-Minerals (CENTRUM SILVER PO) Take 1 tablet by mouth daily.        . NON FORMULARY Oxygen 3L/min       . oxyCODONE-acetaminophen (PERCOCET) 5-325 MG per tablet Take 2 tablets by mouth every 8 (eight) hours as needed.        Marland Kitchen PARoxetine (PAXIL) 40 MG tablet Take 1 tablet (40 mg total) by mouth daily.  90 tablet  0  . torsemide (DEMADEX) 20 MG tablet Take 20 mg by mouth daily. Pt only takes maybe twice/week       . warfarin (COUMADIN) 5 MG tablet Take 1 to 2 tablets ( = 5 to 10 mg) daily as instructed by anticoagulation clinic.  60 tablet  2   No facility-administered encounter medications on file as of 04/09/2013.       Review of Systems  Constitutional: Negative.   HENT: Negative.   Eyes: Negative.   Respiratory: Negative.   Cardiovascular: Positive for leg swelling (left leg and left arm ).  Gastrointestinal: Negative.   Endocrine: Negative.   Genitourinary: Negative.   Musculoskeletal: Negative.   Skin:  Negative.   Allergic/Immunologic: Negative.   Neurological: Negative.   Hematological: Negative.   Psychiatric/Behavioral: Negative.        Objective:   Physical Exam There were no vitals taken for this visit. Non-pitting edema of both left upper and lower extremity       Assessment & Plan:   A-fib - Plan: POCT INR  Peripheral edema  Patient Instructions   Anticoagulation Dose Instructions as of 04/09/2013     Dorene Grebe Tue Wed Thu Fri Sat   New Dose 7.5 mg 10 mg 10 mg 10 mg 7.5 mg 10 mg 10 mg    Description       Continue same dose of 1 and 1/2 tablet on sundays and thursdays and 2 tablets on all other days     We will continue to work on this appointment with the vascular surgeons and try to get it sooner and possible Continue your fluid pill If worsening of symptoms let us no we will see you sooner   Arrie Senate MD

## 2013-04-10 ENCOUNTER — Telehealth: Payer: Self-pay | Admitting: Family Medicine

## 2013-04-10 MED ORDER — AZITHROMYCIN 250 MG PO TABS: ORAL_TABLET | ORAL | Status: DC

## 2013-04-10 NOTE — Telephone Encounter (Signed)
zpak sent to [facility]

## 2013-04-10 NOTE — Telephone Encounter (Signed)
achey all over, nausea - no vomiting , chills and sweats.  Cough, sore throat , runny nose All started last night.  Can something be called in for the cold? Nausea better

## 2013-04-22 ENCOUNTER — Ambulatory Visit (INDEPENDENT_AMBULATORY_CARE_PROVIDER_SITE_OTHER): Payer: Medicare Other | Admitting: Pharmacist

## 2013-04-22 DIAGNOSIS — I4891 Unspecified atrial fibrillation: Secondary | ICD-10-CM

## 2013-04-22 NOTE — Patient Instructions (Addendum)
Anticoagulation Dose Instructions as of 04/22/2013     Sun Mon Tue Wed Thu Fri Sat   New Dose 7.5 mg 10 mg 10 mg 10 mg 7.5 mg 10 mg 10 mg    Description       Continue same dose of 1 and 1/2 tablet on sundays and thursdays and 2 tablets on all other days      INR was 2.6 today

## 2013-05-02 ENCOUNTER — Encounter: Payer: Self-pay | Admitting: Vascular Surgery

## 2013-05-03 ENCOUNTER — Ambulatory Visit (HOSPITAL_COMMUNITY)
Admission: RE | Admit: 2013-05-03 | Discharge: 2013-05-03 | Disposition: A | Discharge status: Home / Self Care | Payer: Medicare Other | Source: Ambulatory Visit | Attending: Vascular Surgery | Admitting: Vascular Surgery

## 2013-05-03 ENCOUNTER — Ambulatory Visit (INDEPENDENT_AMBULATORY_CARE_PROVIDER_SITE_OTHER)
Admission: RE | Admit: 2013-05-03 | Discharge: 2013-05-03 | Disposition: A | Discharge status: Home / Self Care | Payer: Medicare Other | Source: Ambulatory Visit | Attending: Vascular Surgery | Admitting: Vascular Surgery

## 2013-05-03 ENCOUNTER — Encounter: Payer: Self-pay | Admitting: Vascular Surgery

## 2013-05-03 ENCOUNTER — Other Ambulatory Visit: Payer: Self-pay | Admitting: Vascular Surgery

## 2013-05-03 ENCOUNTER — Ambulatory Visit (INDEPENDENT_AMBULATORY_CARE_PROVIDER_SITE_OTHER): Payer: Medicare Other | Admitting: Vascular Surgery

## 2013-05-03 ENCOUNTER — Ambulatory Visit (HOSPITAL_COMMUNITY): Admission: RE | Admit: 2013-05-03 | Payer: Medicare Other | Source: Ambulatory Visit

## 2013-05-03 VITALS — BP 128/84 | HR 81 | Ht 63.5 in | Wt 161.6 lb

## 2013-05-03 DIAGNOSIS — M7989 Other specified soft tissue disorders: Secondary | ICD-10-CM | POA: Diagnosis present

## 2013-05-03 DIAGNOSIS — M79609 Pain in unspecified limb: Secondary | ICD-10-CM

## 2013-05-03 NOTE — Progress Notes (Signed)
[facility] [place] [facility]  Referred by:  Chipper Herb, MD [address] [place], [place] [address]  Reason for referral: Swollen L arm and  leg  History of Present Illness  Melinda Collins is a 72 y.o. ([birth year]) female who presents with chief complaint: swollen leg.  Patient notes, onset of swelling ~1.5 months ago, associated with no obvious trigger.  The patient's symptoms include: L upper > lower limb swelling and musculoskeletal pains in both limbs..  The patient has had no history of DVT, no history of pregnancy, no history of varicose vein, no history of venous stasis ulcers, no history of  Lymphedema and no history of skin changes in lower legs.  There is no family history of venous disorders.  The patient has never used compression stockings in the past.  Past Medical History  Diagnosis Date  . HLD (hyperlipidemia)   . Permanent atrial fibrillation   . HTN (hypertension)     mild; pulmonary  . Diastolic dysfunction     noted on echo w/some apparent volume issues in past but not severe   . Tobacco abuse   . COPD (chronic obstructive pulmonary disease)     spirometry 04/20/10 FEV1 1.38 (58%) with ration 70  . Uterine cancer     Past Surgical History  Procedure Laterality Date  . Abdominal hysterectomy      abd?  . Cervical disc surgery      x2    History   Social History  . Marital Status: Married    Spouse Name: N/A    Number of Children: N/A  . Years of Education: N/A   Occupational History  . Not on file.   Social History Main Topics  . Smoking status: Former Smoker -- 1.50 packs/day    Types: Cigarettes    Quit date: 04/03/2013  . Smokeless tobacco: Not on file     Comment: using blue cigs  . Alcohol Use: No  . Drug Use: No  . Sexual Activity: Not on file   Other Topics Concern  . Not on file   Social History Narrative   Single, children; disabled.     Family History  Problem Relation Age of Onset  . Coronary artery  disease      fhx  . Heart disease Mother   . Stroke Mother   . Heart attack Father   . Kidney disease Father   . Cancer Father   . Diabetes Sister   . Cancer Brother     Current Outpatient Prescriptions on File Prior to Visit  Medication Sig Dispense Refill  . albuterol (VENTOLIN HFA) 108 (90 BASE) MCG/ACT inhaler Inhale 1 puff into the lungs every 4 (four) hours as needed for wheezing.  18 g  5  . atorvastatin (LIPITOR) 80 MG tablet Take 1 tablet (80 mg total) by mouth daily.  30 tablet  5  . azithromycin (ZITHROMAX) 250 MG tablet As directed.  6 tablet  0  . Multiple Vitamins-Minerals (CENTRUM SILVER PO) Take 1 tablet by mouth daily.        . NON FORMULARY Oxygen 3L/min       . oxyCODONE-acetaminophen (PERCOCET) 5-325 MG per tablet Take 2 tablets by mouth every 8 (eight) hours as needed.        Marland Kitchen PARoxetine (PAXIL) 40 MG tablet Take 1 tablet (40 mg total) by mouth daily.  90 tablet  0  . torsemide (DEMADEX) 20 MG tablet Take 20 mg by mouth  daily. Pt only takes maybe twice/week       . warfarin (COUMADIN) 5 MG tablet Take 1 to 2 tablets ( = 5 to 10 mg) daily as instructed by anticoagulation clinic.  60 tablet  2   No current facility-administered medications on file prior to visit.    Allergies  Allergen Reactions  . Niaspan [Niacin]      REVIEW OF SYSTEMS:  (Positives checked otherwise negative)  CARDIOVASCULAR:  []  chest pain, []  chest pressure, []  palpitations, []  shortness of breath when laying flat, [x]  shortness of breath with exertion,  [x]  pain in feet when walking, []  pain in feet when laying flat, []  history of blood clot in veins (DVT), []  history of phlebitis, [x]  swelling in legs, []  varicose veins  PULMONARY:  []  productive cough, []  asthma, [x]  wheezing  NEUROLOGIC:  []  weakness in arms or legs, [x]  numbness in arms or legs, []  difficulty speaking or slurred speech, []  temporary loss of vision in one eye, []  dizziness  HEMATOLOGIC:  []  bleeding problems, []   problems with blood clotting too easily  MUSCULOSKEL:  []  joint pain, []  joint swelling  GASTROINTEST:  []  vomiting blood, []  blood in stool     GENITOURINARY:  []  burning with urination, []  blood in urine  PSYCHIATRIC:  []  history of major depression  INTEGUMENTARY:  []  rashes, []  ulcers  CONSTITUTIONAL:  []  fever, []  chills   Physical Examination Filed Vitals:   05/03/13 1459  BP: 128/84  Pulse: 81  Height: 5' 3.5" (1.613 m)  Weight: 161 lb 9.6 oz (73.301 kg)  SpO2: 100%   Body mass index is 28.17 kg/(m^2).  General: A&O x 3, WDWN, mild obese  Head: [place]/AT  Ear/Nose/Throat: Hearing grossly intact, nares w/o erythema or drainage, oropharynx w/o Erythema/Exudate  Eyes: PERRLA, EOMI  Neck: Supple, no nuchal rigidity, no palpable LAD  Pulmonary: Sym exp, good air movt, CTAB, no rales, rhonchi, & wheezing  Cardiac: RRR, Nl S1, S2, no Murmurs, rubs or gallops  Vascular: Vessel Right Left  Radial Palpable Palpable  Brachial Palpable Palpable  Carotid Palpable, without bruit Palpable, without bruit  Aorta Not palpable N/A  Femoral Palpable Palpable  Popliteal Not palpable Not palpable  PT Not Palpable Faintly Palpable  DP Faintly Palpable Faintly Palpable   Gastrointestinal: soft, NTND, -G/R, - HSM, - masses, - CVAT B  Musculoskeletal: M/S 5/5 throughout , Extremities without ischemic changes , L arm slightly bigger than R without significant edema, no evidence of BLE edema, no LDS or varicosities in either leg  Neurologic: CN 2-12 intact , Pain and light touch intact in extremities , Motor exam as listed above  Psychiatric: Judgment intact, Mood & affect appropriate for pt's clinical situation  Dermatologic: See M/S exam for extremity exam, no rashes otherwise noted  Lymph : No Cervical, Axillary, or Inguinal lymphadenopathy   Non-Invasive Vascular Imaging  LLE and LUE Venous Duplex (Date: 05/03/2013):   No DVT and SVT in either arm or leg  Medical  Decision Making  Melinda Collins is a 72 y.o. female who presents with: h/o L arm and leg swelling   No single etiology would account for L arm AND L leg swelling.    There is no evidence of DVT today in either limb.  Pt does not have evidence of LLE chronic venous insufficiency   With no history of L axillary procedure, it seems unlikely the patient is presenting at 72 y/o with lymphedema.  If the patient has  recurrence of clinically significant swelling in either limb, compression garments should be considered.  Thank you for allowing [place] to participate in this patient's care.  Adele Barthel, MD [facility] [place] [facility] Office: [phone] Pager: [phone]  05/03/2013, 4:35 PM

## 2013-05-08 ENCOUNTER — Ambulatory Visit (INDEPENDENT_AMBULATORY_CARE_PROVIDER_SITE_OTHER): Payer: Medicare Other | Admitting: Pharmacist

## 2013-05-08 DIAGNOSIS — I4891 Unspecified atrial fibrillation: Secondary | ICD-10-CM

## 2013-05-08 LAB — POCT INR: INR: 2.9

## 2013-05-08 NOTE — Progress Notes (Signed)
Patient ID: Melinda Collins, female   DOB: 1940-10-11, 72 y.o.   MRN: [id number]  See anticoagulation visit

## 2013-05-08 NOTE — Patient Instructions (Signed)
Anticoagulation Dose Instructions as of 05/08/2013     Dorene Grebe Tue Wed Thu Fri Sat   New Dose 7.5 mg 10 mg 10 mg 10 mg 7.5 mg 10 mg 10 mg    Description       Continue same dose of 1 and 1/2 tablet on sundays and thursdays and 2 tablets on all other days       INR was 2.4 today

## 2013-05-15 ENCOUNTER — Telehealth: Payer: Self-pay | Admitting: Family Medicine

## 2013-05-16 ENCOUNTER — Other Ambulatory Visit: Payer: Self-pay

## 2013-05-16 ENCOUNTER — Ambulatory Visit: Payer: Medicare Other | Admitting: Family Medicine

## 2013-05-16 DIAGNOSIS — I4891 Unspecified atrial fibrillation: Secondary | ICD-10-CM

## 2013-05-16 MED ORDER — WARFARIN SODIUM 5 MG PO TABS: ORAL_TABLET | ORAL | Status: DC

## 2013-05-16 NOTE — Telephone Encounter (Signed)
appt time changed

## 2013-05-20 ENCOUNTER — Telehealth: Payer: Self-pay | Admitting: Pharmacist

## 2013-05-20 NOTE — Telephone Encounter (Signed)
I reviewed patient's medication history.  Rx for warfarin was sent to [facility] 05/16/13 by Dorothey Baseman.  Called Amy at [facility] and she states they have not received.   Printed out documentation refill and faxed to [facility].  Signed by Dr Laurance Flatten

## 2013-05-22 ENCOUNTER — Ambulatory Visit: Payer: Medicare Other | Admitting: Family Medicine

## 2013-05-23 ENCOUNTER — Encounter: Payer: Self-pay | Admitting: Family Medicine

## 2013-05-23 ENCOUNTER — Ambulatory Visit (INDEPENDENT_AMBULATORY_CARE_PROVIDER_SITE_OTHER): Payer: Medicare Other | Admitting: Family Medicine

## 2013-05-23 VITALS — BP 141/98 | HR 85 | Temp 97.4°F | Ht 63.5 in | Wt 161.6 lb

## 2013-05-23 DIAGNOSIS — M7989 Other specified soft tissue disorders: Secondary | ICD-10-CM

## 2013-05-23 DIAGNOSIS — Z79899 Other long term (current) drug therapy: Secondary | ICD-10-CM | POA: Insufficient documentation

## 2013-05-23 DIAGNOSIS — M545 Low back pain: Secondary | ICD-10-CM

## 2013-05-23 DIAGNOSIS — I10 Essential (primary) hypertension: Secondary | ICD-10-CM

## 2013-05-23 DIAGNOSIS — I4891 Unspecified atrial fibrillation: Secondary | ICD-10-CM

## 2013-05-23 DIAGNOSIS — G8929 Other chronic pain: Secondary | ICD-10-CM | POA: Insufficient documentation

## 2013-05-23 DIAGNOSIS — E785 Hyperlipidemia, unspecified: Secondary | ICD-10-CM

## 2013-05-23 LAB — POCT INR: INR: 2.9

## 2013-05-23 NOTE — Progress Notes (Signed)
Subjective:    Patient ID: Melinda Collins, female    DOB: May 11, 1941, 72 y.o.   MRN: [id number]  HPI Patient here today for follow up from vascular surgeon - left arm swelling. See note from vascular and pain specialist. The assessment indication the reason for edema could be found and he recommended compression garments.     Patient Active Problem List   Diagnosis Date Noted  . Swelling of limb 05/03/2013  . A-fib 10/24/2012  . Preop cardiovascular exam 10/26/2011  . Hypertension 05/17/2011  . DYSPNEA 04/20/2010  . CHEST PAIN 10/08/2009  . HYPERLIPIDEMIA 02/10/2009  . TOBACCO ABUSE 02/10/2009  . 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 Atrial fibrillation 02/10/2009   Outpatient Encounter Prescriptions as of 05/23/2013  Medication Sig  . albuterol (VENTOLIN HFA) 108 (90 BASE) MCG/ACT inhaler Inhale 1 puff into the lungs every 4 (four) hours as needed for wheezing.  Marland Kitchen atorvastatin (LIPITOR) 80 MG tablet Take 1 tablet (80 mg total) by mouth daily.  . Multiple Vitamins-Minerals (CENTRUM SILVER PO) Take 1 tablet by mouth daily.    . NON FORMULARY Oxygen 3L/min   . oxyCODONE-acetaminophen (PERCOCET) 5-325 MG per tablet Take 2 tablets by mouth every 8 (eight) hours as needed.    Marland Kitchen PARoxetine (PAXIL) 40 MG tablet Take 1 tablet (40 mg total) by mouth daily.  Marland Kitchen torsemide (DEMADEX) 20 MG tablet Take 20 mg by mouth daily. Pt only takes maybe twice/week   . warfarin (COUMADIN) 5 MG tablet Take 1 to 2 tablets ( = 5 to 10 mg) daily as instructed by anticoagulation clinic.  . [DISCONTINUED] azithromycin (ZITHROMAX) 250 MG tablet As directed.    Review of Systems  Constitutional: Negative.   HENT: Negative.   Eyes: Negative.   Respiratory: Negative.   Cardiovascular: Negative.   Gastrointestinal: Negative.   Endocrine: Negative.   Genitourinary: Negative.   Musculoskeletal: Positive for arthralgias (legs), back pain and myalgias (left arm pain).  Skin: Negative.   Allergic/Immunologic:  Negative.   Neurological: Negative.   Hematological: Negative.   Psychiatric/Behavioral: Negative.        Objective:   Physical Exam  Nursing note and vitals reviewed. Constitutional: She is oriented to person, place, and time. She appears well-developed and well-nourished.  HENT:  Head: Normocephalic and atraumatic.  Mouth/Throat: Oropharynx is clear and moist. No oropharyngeal exudate.  Eyes: Conjunctivae and EOM are normal. Pupils are equal, round, and reactive to light. Right eye exhibits no discharge. Left eye exhibits no discharge. No scleral icterus.  Neck: Normal range of motion. Neck supple. No JVD present. No thyromegaly present.  Cardiovascular: Normal rate and normal heart sounds.  Exam reveals no gallop.   No murmur heard. Patient remains in atrial fibrillation with an irregular irregular rhythm at 84 per minute  Pulmonary/Chest: Effort normal and breath sounds normal. No respiratory distress. She has no wheezes. She has no rales. She exhibits no tenderness.  There is less wheezing and less congestion since she is no longer smokes  Abdominal: Soft. Bowel sounds are normal. She exhibits no mass. There is no tenderness. There is no rebound and no guarding.  Musculoskeletal: Normal range of motion. She exhibits edema. She exhibits no tenderness.  Mild nonpitting edema left arm  Lymphadenopathy:    She has no cervical adenopathy.  Neurological: She is alert and oriented to person, place, and time. She has normal reflexes.  Skin: Skin is warm and dry. Rash noted.  Psychiatric: She has a normal mood and affect. Her behavior  is normal. Judgment and thought content normal.   BP 141/98  Pulse 85  Temp(Src) 97.4 F (36.3 C) (Oral)  Ht 5' 3.5" (1.613 m)  Wt 161 lb 9.6 oz (73.301 kg)  BMI 28.17 kg/m2        Assessment & Plan:   1. Swelling of limb   2. A-fib   3. Hypertension   4. HYPERLIPIDEMIA   5. Atrial fibrillation   6. Chronic low back pain   7. High risk  medication use    No orders of the defined types were placed in this encounter.   No orders of the defined types were placed in this encounter.   Patient Instructions   Continue current medications. Continue good therapeutic lifestyle changes.  Fall precautions discussed with patient. Follow up as planned and earlier as needed.   Anticoagulation Dose Instructions as of 05/23/2013     Dorene Grebe Tue Wed Thu Fri Sat   New Dose 7.5 mg 10 mg 10 mg 10 mg 7.5 mg 10 mg 10 mg    Description       Continue same dose of 1 and 1/2 tablet on sundays and thursdays and 2 tablets on all other days     Continue with visit with Dr. Carloyn Manner Return to clinic and see the clinical pharmacist in a couple of weeks Continue to remain off of cigarettes If you need to see the cardiologist before your back surgery cholecystectomy we will make sure that you get in as soon as possible   Take copy of vascular and vein note to Dr. Ulyess Blossom MD

## 2013-05-23 NOTE — Patient Instructions (Addendum)
Continue current medications. Continue good therapeutic lifestyle changes.  Fall precautions discussed with patient. Follow up as planned and earlier as needed.   Anticoagulation Dose Instructions as of 05/23/2013     Dorene Grebe Tue Wed Thu Fri Sat   New Dose 7.5 mg 10 mg 10 mg 10 mg 7.5 mg 10 mg 10 mg    Description       Continue same dose of 1 and 1/2 tablet on sundays and thursdays and 2 tablets on all other days     Continue with visit with Dr. Carloyn Manner Return to clinic and see the clinical pharmacist in a couple of weeks Continue to remain off of cigarettes If you need to see the cardiologist before your back surgery cholecystectomy we will make sure that you get in as soon as possible

## 2013-06-05 ENCOUNTER — Ambulatory Visit: Payer: Medicare Other

## 2013-06-10 ENCOUNTER — Ambulatory Visit (INDEPENDENT_AMBULATORY_CARE_PROVIDER_SITE_OTHER): Payer: Medicare Other | Admitting: Pharmacist

## 2013-06-10 DIAGNOSIS — I4891 Unspecified atrial fibrillation: Secondary | ICD-10-CM

## 2013-06-10 NOTE — Patient Instructions (Signed)
Anticoagulation Dose Instructions as of 06/10/2013     Melinda Collins Tue Wed Thu Fri Sat   New Dose 7.5 mg 10 mg 10 mg 10 mg 7.5 mg 10 mg 10 mg    Description       Continue same dose of 1 and 1/2 tablet on sundays and thursdays and 2 tablets on all other days      INR was 2.1 today

## 2013-06-17 ENCOUNTER — Other Ambulatory Visit: Payer: Self-pay | Admitting: Family Medicine

## 2013-06-24 ENCOUNTER — Ambulatory Visit (INDEPENDENT_AMBULATORY_CARE_PROVIDER_SITE_OTHER): Payer: Medicare Other | Admitting: Pharmacist

## 2013-06-24 DIAGNOSIS — I4891 Unspecified atrial fibrillation: Secondary | ICD-10-CM

## 2013-06-24 LAB — POCT INR: INR: 2.6

## 2013-06-24 NOTE — Patient Instructions (Signed)
Anticoagulation Dose Instructions as of 06/24/2013     Melinda Collins Tue Wed Thu Fri Sat   New Dose 7.5 mg 10 mg 10 mg 10 mg 7.5 mg 10 mg 10 mg    Description       Continue same dose of 1 and 1/2 tablet on sundays and thursdays and 2 tablets on all other days      INR was 2.6

## 2013-07-08 ENCOUNTER — Ambulatory Visit (INDEPENDENT_AMBULATORY_CARE_PROVIDER_SITE_OTHER): Payer: Medicare Other | Admitting: Pharmacist

## 2013-07-08 VITALS — BP 124/80 | HR 88 | Ht 63.5 in | Wt 164.0 lb

## 2013-07-08 DIAGNOSIS — Z0000 Encounter for general adult medical examination without abnormal findings: Secondary | ICD-10-CM

## 2013-07-08 DIAGNOSIS — I4891 Unspecified atrial fibrillation: Secondary | ICD-10-CM

## 2013-07-08 LAB — POCT INR: INR: 2.8

## 2013-07-08 NOTE — Progress Notes (Signed)
Subjective:    Melinda Collins is a 72 y.o. female who presents for Medicare Initial annual wellness visit  Preventive Screening-Counseling & Management  Tobacco History  Smoking status  . Former Smoker -- 1.50 packs/day  . Types: Cigarettes  . Quit date: 04/03/2013  Smokeless tobacco  . Not on file    Comment: using blue cigs      Current Problems (verified) Patient Active Problem List   Diagnosis Date Noted  . Chronic low back pain 05/23/2013  . High risk medication use 05/23/2013  . Swelling of limb 05/03/2013  . A-fib 10/24/2012  . Preop cardiovascular exam 10/26/2011  . Hypertension 05/17/2011  . DYSPNEA 04/20/2010  . CHEST PAIN 10/08/2009  . HYPERLIPIDEMIA 02/10/2009  . TOBACCO ABUSE 02/10/2009  . 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 Atrial fibrillation 02/10/2009    Medications Prior to Visit Current Outpatient Prescriptions on File Prior to Visit  Medication Sig Dispense Refill  . albuterol (VENTOLIN HFA) 108 (90 BASE) MCG/ACT inhaler Inhale 1 puff into the lungs every 4 (four) hours as needed for wheezing.  18 g  5  . atorvastatin (LIPITOR) 80 MG tablet Take 1 tablet (80 mg total) by mouth daily.  30 tablet  5  . Multiple Vitamins-Minerals (CENTRUM SILVER PO) Take 1 tablet by mouth daily.        . NON FORMULARY Oxygen 3L/min       . oxyCODONE-acetaminophen (PERCOCET) 5-325 MG per tablet Take 2 tablets by mouth every 8 (eight) hours as needed.        Marland Kitchen PARoxetine (PAXIL) 40 MG tablet TAKE ONE TABLET BY MOUTH DAILY  90 tablet  0  . torsemide (DEMADEX) 20 MG tablet TAKE ONE & ONE-HALF (1 & 1/2) TABLETS BY MOUTH ONCE DAILY  45 tablet  2  . warfarin (COUMADIN) 5 MG tablet Take 1 to 2 tablets ( = 5 to 10 mg) daily as instructed by anticoagulation clinic.  60 tablet  2   No current facility-administered medications on file prior to visit.    Current Medications (verified) Current Outpatient Prescriptions  Medication Sig Dispense Refill  . albuterol (VENTOLIN  HFA) 108 (90 BASE) MCG/ACT inhaler Inhale 1 puff into the lungs every 4 (four) hours as needed for wheezing.  18 g  5  . atorvastatin (LIPITOR) 80 MG tablet Take 1 tablet (80 mg total) by mouth daily.  30 tablet  5  . Multiple Vitamins-Minerals (CENTRUM SILVER PO) Take 1 tablet by mouth daily.        . NON FORMULARY Oxygen 3L/min       . oxyCODONE-acetaminophen (PERCOCET) 5-325 MG per tablet Take 2 tablets by mouth every 8 (eight) hours as needed.        Marland Kitchen PARoxetine (PAXIL) 40 MG tablet TAKE ONE TABLET BY MOUTH DAILY  90 tablet  0  . torsemide (DEMADEX) 20 MG tablet TAKE ONE & ONE-HALF (1 & 1/2) TABLETS BY MOUTH ONCE DAILY  45 tablet  2  . warfarin (COUMADIN) 5 MG tablet Take 1 to 2 tablets ( = 5 to 10 mg) daily as instructed by anticoagulation clinic.  60 tablet  2   No current facility-administered medications for this visit.     Allergies (verified) Niaspan   PAST HISTORY  Family History Family History  Problem Relation Age of Onset  . Coronary artery disease      fhx  . Heart disease Mother   . Stroke Mother   . Diabetes Mother   . Heart  attack Father   . Kidney disease Father   . Cancer Father     prostate  . Cancer Brother   . Heart attack Brother   . Heart disease Brother     CHF  . COPD Brother     Social History History  Substance Use Topics  . Smoking status: Former Smoker -- 1.50 packs/day    Types: Cigarettes    Quit date: 04/03/2013  . Smokeless tobacco: Not on file     Comment: using blue cigs  . Alcohol Use: No     Are there smokers in your home (other than you)? No  Risk Factors Current exercise habits: Exercise is limited by orthopedic condition(s): spinal condition - patient is waiting to get clearance from neurosurgeon to have back surgery..  Dietary issues discussed: none   Cardiac risk factors: advanced age (older than 60 for men, 37 for women), family history of premature cardiovascular disease, hypertension and smoking/ tobacco  exposure.  Depression Screen (Note: if answer to either of the following is "Yes", a more complete depression screening is indicated)   Over the past 2 weeks, have you felt down, depressed or hopeless? No  Over the past 2 weeks, have you felt little interest or pleasure in doing things? No  Have you lost interest or pleasure in daily life? No  Do you often feel hopeless? No  Do you cry easily over simple problems? No  Activities of Daily Living In your present state of health, do you have any difficulty performing the following activities?:  Driving? no Managing money?  sometimes Feeding yourself? No Getting from bed to chair? No Climbing a flight of stairs? No Preparing food and eating?: No Bathing or showering? No - uses shower chair Getting dressed: No Getting to the toilet? No Using the toilet:No Moving around from place to place: No In the past year have you fallen or had a near fall?:No   Are you sexually active?  No  Do you have more than one partner?  No  Hearing Difficulties: Yes Do you often ask people to speak up or repeat themselves? Yes Do you experience ringing or noises in your ears? No Do you have difficulty understanding soft or whispered voices? Yes   Do you feel that you have a problem with memory? No  Do you often misplace items? No  Do you feel safe at home?  Yes  Cognitive Testing  Alert? Yes  Normal Appearance?Yes  Oriented to person? Yes  Place? Yes   Time? Yes  Recall of three objects?  Yes  Can perform simple calculations? Yes  Displays appropriate judgment?Yes  Can read the correct time from a watch face?Yes   Advanced Directives have been discussed with the patient? Yes  List the Names of Other Physician/Practitioners you currently use: 1.  Dr Glenna Fellows - neurosurgeon - [place], [place] 2.  Dr Jon Billings - Cardiologist - [facility] 3.  Dr Raeanne Gathers - audiologist   Indicate any recent Medical Services you may have received from other  than [facility] providers in the past year (date may be approximate).  Immunization History  Administered Date(s) Administered  . Influenza,inj,Quad PF,36+ Mos 04/09/2013    Screening Tests Health Maintenance  Topic Date Due  . Colon Cancer Screening Annual Fobt  07/04/1992  . Zostavax  08/05/2013  . Mammogram  09/30/2013  . Tetanus/tdap  07/18/2013  . Influenza Vaccine  02/15/2014  . Colonoscopy  07/18/2020  . Pneumococcal Polysaccharide Vaccine Age  65 And Over  Completed    All answers were reviewed with the patient and necessary referrals were made:  [facility] Pharmacist   07/08/2013   History reviewed: allergies, current medications, past family history, past medical history, past social history, past surgical history and problem list    Objective:     Body mass index is 28.59 kg/(m^2). BP 124/80  Pulse 88  Ht 5' 3.5" (1.613 m)  Wt 164 lb (74.39 kg)  BMI 28.59 kg/m2     Assessment:     Annual Wellnes Visit Therapeutic Anticoagulation     Plan:     During the course of the visit the patient was educated and counseled about appropriate screening and preventive services including:    Pneumococcal vaccine   Influenza vaccine  Hepatitis B vaccine  Td vaccine  Screening mammography  Bone densitometry screening  Colorectal cancer screening  Glaucoma screening  Smoking cessation counseling  Advanced directives: has NO advanced directive - not interested in additional information  Referral made for: 1.  Mammogram (will schedule for after March 1st 2015 - due to surgery scheduled for Feb 17th 2015) 2.  Follow up new hearing aids - patient has already met with audiologist but is waiting until insurances changes in 2015. 3.  Zostavax to be given at appt 08/05/13 - patient unable to wait for administration today. 4.  Again reminded that FOBT needed - patient has card to complete this at home.  5.  Encouragement and counseling given for smoking cessation  - patient understands this is very important given forth coming surgery and healing post surgery.   Patient Instructions (the written plan) was given to the patient.  Medicare Attestation I have personally reviewed: The patient's medical and social history Their use of alcohol, tobacco or illicit drugs Their current medications and supplements The patient's functional ability including ADLs,fall risks, home safety risks, cognitive, and hearing and visual impairment Diet and physical activities Evidence for depression or mood disorders  The patient's weight, height, BMI, and BP have been recorded in the chart.  I have made referrals, counseling, and provided education to the patient based on review of the above and I have provided the patient with a written personalized care plan for preventive services.     Cherre Robins, PharmD, CPP  [facility] Pharmacist   07/08/2013

## 2013-07-08 NOTE — Patient Instructions (Addendum)
Health Maintenance Summary    COLON CANCER SCREENING ANNUAL FOBT Overdue 07/04/1992      MAMMOGRAM Overdue 10/12/2008      TETANUS/TDAP Next Due 07/18/2013      INFLUENZA VACCINE Next Due 02/15/2014      COLONOSCOPY Next Due 07/18/2020        Anticoagulation Dose Instructions as of 07/08/2013     Melinda Collins Tue Wed Thu Fri Sat   New Dose 7.5 mg 10 mg 10 mg 10 mg 7.5 mg 10 mg 10 mg    Description       Continue same dose of 1 and 1/2 tablet on sundays and thursdays and 2 tablets on all other days       Preventive Care for Adults, Female A healthy lifestyle and preventive care can promote health and wellness. Preventive health guidelines for women include the following key practices.  A routine yearly physical is a good way to check with your caregiver about your health and preventive screening. It is a chance to share any concerns and updates on your health, and to receive a thorough exam.  Visit your dentist for a routine exam and preventive care every 6 months. Brush your teeth twice a day and floss once a day. Good oral hygiene prevents tooth decay and gum disease.  The frequency of eye exams is based on your age, health, family medical history, use of contact lenses, and other factors. Follow your caregiver's recommendations for frequency of eye exams.  Eat a healthy diet. Foods like vegetables, fruits, whole grains, low-fat dairy products, and lean protein foods contain the nutrients you need without too many calories. Decrease your intake of foods high in solid fats, added sugars, and salt. Eat the right amount of calories for you.Get information about a proper diet from your caregiver, if necessary.  Regular physical exercise is one of the most important things you can do for your health. Most adults should get at least 150 minutes of moderate-intensity exercise (any activity that increases your heart rate and causes you to sweat) each week. In addition, most adults need  muscle-strengthening exercises on 2 or more days a week.  Maintain a healthy weight. The body mass index (BMI) is a screening tool to identify possible weight problems. It provides an estimate of body fat based on height and weight. Your caregiver can help determine your BMI, and can help you achieve or maintain a healthy weight.For adults 20 years and older:  A BMI below 18.5 is considered underweight.  A BMI of 18.5 to 24.9 is normal.  A BMI of 25 to 29.9 is considered overweight.  A BMI of 30 and above is considered obese.  Maintain normal blood lipids and cholesterol levels by exercising and minimizing your intake of saturated fat. Eat a balanced diet with plenty of fruit and vegetables. Blood tests for lipids and cholesterol should begin at age 43 and be repeated every 5 years. If your lipid or cholesterol levels are high, you are over 50, or you are at high risk for heart disease, you may need your cholesterol levels checked more frequently.Ongoing high lipid and cholesterol levels should be treated with medicines if diet and exercise are not effective.  If you smoke, find out from your caregiver how to quit. If you do not use tobacco, do not start.  Lung cancer screening is recommended for adults aged 81 80 years who are at high risk for developing lung cancer because of a history of smoking.  Yearly low-dose computed tomography (CT) is recommended for people who have at least a 30-pack-year history of smoking and are a current smoker or have quit within the past 15 years. A pack year of smoking is smoking an average of 1 pack of cigarettes a day for 1 year (for example: 1 pack a day for 30 years or 2 packs a day for 15 years). Yearly screening should continue until the smoker has stopped smoking for at least 15 years. Yearly screening should also be stopped for people who develop a health problem that would prevent them from having lung cancer treatment.  The Pap test is a screening test  for cervical cancer. A Pap test can show cell changes on the cervix that might become cervical cancer if left untreated. A Pap test is a procedure in which cells are obtained and examined from the lower end of the uterus (cervix).  Women should have a Pap test starting at age 18.  Between ages 72 and 56, Pap tests should be repeated every 2 years.  Beginning at age 108, you should have a Pap test every 3 years as long as the past 3 Pap tests have been normal.  Some women have medical problems that increase the chance of getting cervical cancer. Talk to your caregiver about these problems. It is especially important to talk to your caregiver if a new problem develops soon after your last Pap test. In these cases, your caregiver may recommend more frequent screening and Pap tests.  If you had a hysterectomy for a problem that was not cancer or a condition that could lead to cancer, then you no longer need Pap tests. Even if you no longer need a Pap test, a regular exam is a good idea to make sure no other problems are starting.  If you are between ages 36 and 6, and you have had normal Pap tests going back 10 years, you no longer need Pap tests. Even if you no longer need a Pap test, a regular exam is a good idea to make sure no other problems are starting.  If you have had past treatment for cervical cancer or a condition that could lead to cancer, you need Pap tests and screening for cancer for at least 20 years after your treatment.  If Pap tests have been discontinued, risk factors (such as a new sexual partner) need to be reassessed to determine if screening should be resumed.  The HPV test is an additional test that may be used for cervical cancer screening. The HPV test looks for the virus that can cause the cell changes on the cervix. The cells collected during the Pap test can be tested for HPV. The HPV test could be used to screen women aged 49 years and older, and should be used in women  of any age who have unclear Pap test results. After the age of 28, women should have HPV testing at the same frequency as a Pap test.  Colorectal cancer can be detected and often prevented. Most routine colorectal cancer screening begins at the age of 41 and continues through age 70. However, your caregiver may recommend screening at an earlier age if you have risk factors for colon cancer. On a yearly basis, your caregiver may provide home test kits to check for hidden blood in the stool. Use of a small camera at the end of a tube, to directly examine the colon (sigmoidoscopy or colonoscopy), can detect the earliest forms of  colorectal cancer. Talk to your caregiver about this at age 54, when routine screening begins. Direct examination of the colon should be repeated every 5 to 10 years through age 16, unless early forms of pre-cancerous polyps or small growths are found.  Hepatitis C blood testing is recommended for all people born from [birth year] through 1964 and any individual with known risks for hepatitis C.  Practice safe sex. Use condoms and avoid high-risk sexual practices to reduce the spread of sexually transmitted infections (STIs). STIs include gonorrhea, chlamydia, syphilis, trichomonas, herpes, HPV, and human immunodeficiency virus (HIV). Herpes, HIV, and HPV are viral illnesses that have no cure. They can result in disability, cancer, and death. Sexually active women aged 52 and younger should be checked for chlamydia. Older women with new or multiple partners should also be tested for chlamydia. Testing for other STIs is recommended if you are sexually active and at increased risk.  Osteoporosis is a disease in which the bones lose minerals and strength with aging. This can result in serious bone fractures. The risk of osteoporosis can be identified using a bone density scan. Women ages 14 and over and women at risk for fractures or osteoporosis should discuss screening with their caregivers.  Ask your caregiver whether you should take a calcium supplement or vitamin D to reduce the rate of osteoporosis.  Menopause can be associated with physical symptoms and risks. Hormone replacement therapy is available to decrease symptoms and risks. You should talk to your caregiver about whether hormone replacement therapy is right for you.  Use sunscreen. Apply sunscreen liberally and repeatedly throughout the day. You should seek shade when your shadow is shorter than you. Protect yourself by wearing long sleeves, pants, a wide-brimmed hat, and sunglasses year round, whenever you are outdoors.  Once a month, do a whole body skin exam, using a mirror to look at the skin on your back. Notify your caregiver of new moles, moles that have irregular borders, moles that are larger than a pencil eraser, or moles that have changed in shape or color.  Stay current with required immunizations.  Influenza vaccine. All adults should be immunized every year.  Tetanus, diphtheria, and acellular pertussis (Td, Tdap) vaccine. Pregnant women should receive 1 dose of Tdap vaccine during each pregnancy. The dose should be obtained regardless of the length of time since the last dose. Immunization is preferred during the 27th to 36th week of gestation. An adult who has not previously received Tdap or who does not know her vaccine status should receive 1 dose of Tdap. This initial dose should be followed by tetanus and diphtheria toxoids (Td) booster doses every 10 years. Adults with an unknown or incomplete history of completing a 3-dose immunization series with Td-containing vaccines should begin or complete a primary immunization series including a Tdap dose. Adults should receive a Td booster every 10 years.  Varicella vaccine. An adult without evidence of immunity to varicella should receive 2 doses or a second dose if she has previously received 1 dose. Pregnant females who do not have evidence of immunity should  receive the first dose after pregnancy. This first dose should be obtained before leaving the health care facility. The second dose should be obtained 4 8 weeks after the first dose.  Human papillomavirus (HPV) vaccine. Females aged 12 26 years who have not received the vaccine previously should obtain the 3-dose series. The vaccine is not recommended for use in pregnant females. However, pregnancy testing is not needed  before receiving a dose. If a female is found to be pregnant after receiving a dose, no treatment is needed. In that case, the remaining doses should be delayed until after the pregnancy. Immunization is recommended for any person with an immunocompromised condition through the age of 68 years if she did not get any or all doses earlier. During the 3-dose series, the second dose should be obtained 4 8 weeks after the first dose. The third dose should be obtained 24 weeks after the first dose and 16 weeks after the second dose.  Zoster vaccine. One dose is recommended for adults aged 49 years or older unless certain conditions are present.  Measles, mumps, and rubella (MMR) vaccine. Adults born before [birth year] generally are considered immune to measles and mumps. Adults born in [birth year] or later should have 1 or more doses of MMR vaccine unless there is a contraindication to the vaccine or there is laboratory evidence of immunity to each of the three diseases. A routine second dose of MMR vaccine should be obtained at least 28 days after the first dose for students attending postsecondary schools, health care workers, or international travelers. People who received inactivated measles vaccine or an unknown type of measles vaccine during 1963 1967 should receive 2 doses of MMR vaccine. People who received inactivated mumps vaccine or an unknown type of mumps vaccine before 1979 and are at high risk for mumps infection should consider immunization with 2 doses of MMR vaccine. For females of childbearing  age, rubella immunity should be determined. If there is no evidence of immunity, females who are not pregnant should be vaccinated. If there is no evidence of immunity, females who are pregnant should delay immunization until after pregnancy. Unvaccinated health care workers born before [birth year] who lack laboratory evidence of measles, mumps, or rubella immunity or laboratory confirmation of disease should consider measles and mumps immunization with 2 doses of MMR vaccine or rubella immunization with 1 dose of MMR vaccine.  Pneumococcal 13-valent conjugate (PCV13) vaccine. When indicated, a person who is uncertain of her immunization history and has no record of immunization should receive the PCV13 vaccine. An adult aged 47 years or older who has certain medical conditions and has not been previously immunized should receive 1 dose of PCV13 vaccine. This PCV13 should be followed with a dose of pneumococcal polysaccharide (PPSV23) vaccine. The PPSV23 vaccine dose should be obtained at least 8 weeks after the dose of PCV13 vaccine. An adult aged 27 years or older who has certain medical conditions and previously received 1 or more doses of PPSV23 vaccine should receive 1 dose of PCV13. The PCV13 vaccine dose should be obtained 1 or more years after the last PPSV23 vaccine dose.  Pneumococcal polysaccharide (PPSV23) vaccine. When PCV13 is also indicated, PCV13 should be obtained first. All adults aged 42 years and older should be immunized. An adult younger than age 42 years who has certain medical conditions should be immunized. Any person who resides in a nursing home or long-term care facility should be immunized. An adult smoker should be immunized. People with an immunocompromised condition and certain other conditions should receive both PCV13 and PPSV23 vaccines. People with human immunodeficiency virus (HIV) infection should be immunized as soon as possible after diagnosis. Immunization during chemotherapy or  radiation therapy should be avoided. Routine use of PPSV23 vaccine is not recommended for American Indians, [place] Natives, or people younger than 65 years unless there are medical conditions that require PPSV23 vaccine. When indicated,  people who have unknown immunization and have no record of immunization should receive PPSV23 vaccine. One-time revaccination 5 years after the first dose of PPSV23 is recommended for people aged 70 64 years who have chronic kidney failure, nephrotic syndrome, asplenia, or immunocompromised conditions. People who received 1 2 doses of PPSV23 before age 52 years should receive another dose of PPSV23 vaccine at age 45 years or later if at least 5 years have passed since the previous dose. Doses of PPSV23 are not needed for people immunized with PPSV23 at or after age 75 years.  Meningococcal vaccine. Adults with asplenia or persistent complement component deficiencies should receive 2 doses of quadrivalent meningococcal conjugate (MenACWY-D) vaccine. The doses should be obtained at least 2 months apart. Microbiologists working with certain meningococcal bacteria, [employer] recruits, people at risk during an outbreak, and people who travel to or live in countries with a high rate of meningitis should be immunized. A first-year college student up through age 34 years who is living in a residence hall should receive a dose if she did not receive a dose on or after her 16th birthday. Adults who have certain high-risk conditions should receive one or more doses of vaccine.  Hepatitis A vaccine. Adults who wish to be protected from this disease, have certain high-risk conditions, work with hepatitis A-infected animals, work in hepatitis A research labs, or travel to or work in countries with a high rate of hepatitis A should be immunized. Adults who were previously unvaccinated and who anticipate close contact with an international adoptee during the first 60 days after arrival in the  [place] from a country with a high rate of hepatitis A should be immunized.  Hepatitis B vaccine. Adults who wish to be protected from this disease, have certain high-risk conditions, may be exposed to blood or other infectious body fluids, are household contacts or sex partners of hepatitis B positive people, are clients or workers in certain care facilities, or travel to or work in countries with a high rate of hepatitis B should be immunized.  Haemophilus influenzae type b (Hib) vaccine. A previously unvaccinated person with asplenia or sickle cell disease or having a scheduled splenectomy should receive 1 dose of Hib vaccine. Regardless of previous immunization, a recipient of a hematopoietic stem cell transplant should receive a 3-dose series 6 12 months after her successful transplant. Hib vaccine is not recommended for adults with HIV infection.   Ages 66 and over  Blood pressure check.  Lipid and cholesterol check.  Lung cancer screening. / Every year if you are aged 31 80 years and have a 30-pack-year history of smoking and currently smoke or have quit within the past 15 years. Yearly screening is stopped once you have quit smoking for at least 15 years or develop a health problem that would prevent you from having lung cancer treatment.  Clinical breast exam.  Mammogram. Due now - will refer for appointment / Every year beginning at age 21 and continuing for as long as you are in good health. Consult with your caregiver.  Fecal occult blood test (FOBT) of stool. / Every year beginning at age 17 and continuing until age 21. You may not need to do this test if you get a colonoscopy every 10 years.  Flexible sigmoidoscopy or colonoscopy. Due in 2022/ Every 5 years for a flexible sigmoidoscopy or every 10 years for a colonoscopy beginning at age 53 and continuing until age 28.  Osteoporosis screening. / A one-time  screening for women ages 23 and over and women at risk for fractures  or osteoporosis.  Skin self-exam. / Monthly.  Influenza vaccine. / Every year.  Tetanus, diphtheria, and acellular pertussis (Tdap/Td) vaccine. Up to date / 1 dose of Td every 10 years.  Zoster vaccine.planning to get at next appt/ 1 dose for adults aged 84 years or older.  Pneumococcal polysaccharide (PPSV23) vaccine. Completed  / 1 dose for all adults aged 45 years and older.

## 2013-07-13 ENCOUNTER — Other Ambulatory Visit: Payer: Self-pay | Admitting: Family Medicine

## 2013-07-17 NOTE — Progress Notes (Signed)
SCHEDULED FOR October 07, 2013 1:15    RS

## 2013-07-24 ENCOUNTER — Telehealth: Payer: Self-pay | Admitting: Family Medicine

## 2013-07-24 NOTE — Telephone Encounter (Signed)
APPT MADE FOR HER FOR 07/29/13 FOR PROTIME.  RS

## 2013-08-05 ENCOUNTER — Ambulatory Visit (INDEPENDENT_AMBULATORY_CARE_PROVIDER_SITE_OTHER): Payer: Medicare Other | Admitting: Family Medicine

## 2013-08-05 ENCOUNTER — Encounter: Payer: Self-pay | Admitting: Family Medicine

## 2013-08-05 VITALS — BP 146/105 | HR 79 | Temp 98.3°F | Ht 63.5 in | Wt 159.0 lb

## 2013-08-05 DIAGNOSIS — E8881 Metabolic syndrome: Secondary | ICD-10-CM

## 2013-08-05 DIAGNOSIS — G8929 Other chronic pain: Secondary | ICD-10-CM

## 2013-08-05 DIAGNOSIS — J439 Emphysema, unspecified: Secondary | ICD-10-CM

## 2013-08-05 DIAGNOSIS — M545 Low back pain, unspecified: Secondary | ICD-10-CM

## 2013-08-05 DIAGNOSIS — J438 Other emphysema: Secondary | ICD-10-CM

## 2013-08-05 DIAGNOSIS — E559 Vitamin D deficiency, unspecified: Secondary | ICD-10-CM

## 2013-08-05 DIAGNOSIS — E785 Hyperlipidemia, unspecified: Secondary | ICD-10-CM

## 2013-08-05 DIAGNOSIS — Z79899 Other long term (current) drug therapy: Secondary | ICD-10-CM

## 2013-08-05 DIAGNOSIS — Z23 Encounter for immunization: Secondary | ICD-10-CM

## 2013-08-05 DIAGNOSIS — I4891 Unspecified atrial fibrillation: Secondary | ICD-10-CM

## 2013-08-05 DIAGNOSIS — I10 Essential (primary) hypertension: Secondary | ICD-10-CM

## 2013-08-05 NOTE — Addendum Note (Signed)
Addended by: Zannie Cove on: 08/05/2013 03:56 PM   Modules accepted: Orders

## 2013-08-05 NOTE — Progress Notes (Signed)
Subjective:    Patient ID: Melinda Collins, female    DOB: 02/08/1941, 73 y.o.   MRN: [id number]  HPI Pt here for follow up and management of chronic medical problems. Her back surgery has finally been scheduled for February. She will get a shingles shot today she is also due to get a Prevnar and 18. She will be given FOBT to return to the office he'll come back fasting for lab work.        Patient Active Problem List   Diagnosis Date Noted  . Chronic low back pain 05/23/2013  . High risk medication use 05/23/2013  . Swelling of limb 05/03/2013  . A-fib 10/24/2012  . Preop cardiovascular exam 10/26/2011  . Hypertension 05/17/2011  . DYSPNEA 04/20/2010  . CHEST PAIN 10/08/2009  . HYPERLIPIDEMIA 02/10/2009  . TOBACCO ABUSE 02/10/2009  . 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 Atrial fibrillation 02/10/2009   Outpatient Encounter Prescriptions as of 08/05/2013  Medication Sig  . albuterol (VENTOLIN HFA) 108 (90 BASE) MCG/ACT inhaler Inhale 1 puff into the lungs every 4 (four) hours as needed for wheezing.  Marland Kitchen atorvastatin (LIPITOR) 80 MG tablet TAKE ONE TABLET BY MOUTH DAILY  . Multiple Vitamins-Minerals (CENTRUM SILVER PO) Take 1 tablet by mouth daily.    . NON FORMULARY Oxygen 3L/min   . oxyCODONE-acetaminophen (PERCOCET) 5-325 MG per tablet Take 2 tablets by mouth every 8 (eight) hours as needed.    Marland Kitchen PARoxetine (PAXIL) 40 MG tablet TAKE ONE TABLET BY MOUTH DAILY  . torsemide (DEMADEX) 20 MG tablet TAKE ONE & ONE-HALF (1 & 1/2) TABLETS BY MOUTH ONCE DAILY  . warfarin (COUMADIN) 5 MG tablet Take 1 to 2 tablets ( = 5 to 10 mg) daily as instructed by anticoagulation clinic.    Review of Systems  Constitutional: Negative.   HENT: Negative.   Eyes: Negative.   Respiratory: Negative.   Cardiovascular: Negative.   Gastrointestinal: Negative.   Endocrine: Negative.   Genitourinary: Negative.   Musculoskeletal: Positive for back pain (Pt. is scheduled for surgery 09/03/13 with  Dr Carloyn Manner).  Skin: Negative.   Allergic/Immunologic: Negative.   Neurological: Negative.   Hematological: Negative.   Psychiatric/Behavioral: Negative.        Objective:   Physical Exam  Nursing note and vitals reviewed. Constitutional: She is oriented to person, place, and time. She appears well-developed and well-nourished. No distress.  Positive attitude and pleasant  HENT:  Head: Normocephalic and atraumatic.  Right Ear: External ear normal.  Left Ear: External ear normal.  Nose: Nose normal.  Mouth/Throat: Oropharynx is clear and moist. No oropharyngeal exudate.  Eyes: Conjunctivae and EOM are normal. Pupils are equal, round, and reactive to light. Right eye exhibits no discharge. Left eye exhibits no discharge. No scleral icterus.  Neck: Normal range of motion. Neck supple. No JVD present. No thyromegaly present.  Cardiovascular: Normal rate, normal heart sounds and intact distal pulses.  Exam reveals no gallop and no friction rub.   No murmur heard. Irregular irregular at 72 per minute  Pulmonary/Chest: Effort normal. No respiratory distress. Wheezes: rare wheezes. She has no rales. She exhibits no tenderness.  Tight cough  Abdominal: Soft. Bowel sounds are normal. She exhibits no mass. There is no tenderness. There is no rebound and no guarding.  Musculoskeletal: Normal range of motion. She exhibits no edema and no tenderness.  Slow movement due to back pain with laying down supine and rising from a supine position  Lymphadenopathy:    She  has no cervical adenopathy.  Neurological: She is alert and oriented to person, place, and time. She has normal reflexes. No cranial nerve deficit.  Skin: Skin is warm and dry.  Psychiatric: She has a normal mood and affect. Her behavior is normal. Judgment and thought content normal.   BP 146/105  Pulse 79  Temp(Src) 98.3 F (36.8 C) (Oral)  Ht 5' 3.5" (1.613 m)  Wt 159 lb (72.122 kg)  BMI 27.72 kg/m2        Assessment & Plan:    1. Atrial fibrillation - POCT CBC  2. HYPERLIPIDEMIA - POCT CBC - NMR, lipoprofile  3. Hypertension - POCT CBC - Hepatic function panel - BMP8+EGFR  4. A-fib - POCT INR - POCT CBC  5. Vitamin D deficiency - Vit D  25 hydroxy (rtn osteoporosis monitoring)  6. High risk medication use  7. Chronic low back pain  8. COPD with emphysema  Patient Instructions  Continue current medications. Continue good therapeutic lifestyle changes which include good diet and exercise. Fall precautions discussed with patient. Schedule your flu vaccine if you haven't had it yet If you are over 34 years old - you may need Prevnar 31 or the adult Pneumonia vaccine.  Try to stop smoking completely After your surgery you would need to get a Prevnar vaccine and a Tdap You will also need a DEXA scan and a mammogram. A pelvic exam will also need to be done. Please bring home blood pressure readings  tomorrow when you get your lab work and have the nurse check your blood pressure again Also continue to monitor your blood pressure        Herpes Zoster Virus Vaccine What is this medicine? HERPES ZOSTER VIRUS VACCINE (HUR peez ZOS ter vahy ruhs vak SEEN) is a vaccine. It is used to prevent shingles in adults 72 years old and over. This vaccine is not used to treat shingles or nerve pain from shingles. This medicine may be used for other purposes; ask your health care provider or pharmacist if you have questions. COMMON BRAND NAME(S): Zostavax What should I tell my health care provider before I take this medicine? They need to know if you have any of these conditions: -cancer like leukemia or lymphoma -immune system problems or therapy -infection with fever -tuberculosis -an unusual or allergic reaction to vaccines, neomycin, gelatin, other medicines, foods, dyes, or preservatives -pregnant or trying to get pregnant -breast-feeding How should I use this medicine? This vaccine is for  injection under the skin. It is given by a health care professional. Talk to your pediatrician regarding the use of this medicine in children. This medicine is not approved for use in children. Overdosage: If you think you have taken too much of this medicine contact a poison control center or emergency room at once. NOTE: This medicine is only for you. Do not share this medicine with others. What if I miss a dose? This does not apply. What may interact with this medicine? Do not take this medicine with any of the following medications: -adalimumab -anakinra -etanercept -infliximab -medicines to treat cancer -medicines that suppress your immune system This medicine may also interact with the following medications: -immunoglobulins -steroid medicines like prednisone or cortisone This list may not describe all possible interactions. Give your health care provider a list of all the medicines, herbs, non-prescription drugs, or dietary supplements you use. Also tell them if you smoke, drink alcohol, or use illegal drugs. Some items may interact with your  medicine. What should I watch for while using this medicine? Visit your doctor for regular check ups. This vaccine, like all vaccines, may not fully protect everyone. After receiving this vaccine it may be possible to pass chickenpox infection to others. Avoid people with immune system problems, pregnant women who have not had chickenpox, and newborns of women who have not had chickenpox. Talk to your doctor for more information. What side effects may I notice from receiving this medicine? Side effects that you should report to your doctor or health care professional as soon as possible: -allergic reactions like skin rash, itching or hives, swelling of the face, lips, or tongue -breathing problems -feeling faint or lightheaded, falls -fever, flu-like symptoms -pain, tingling, numbness in the hands or feet -swelling of the ankles, feet,  hands -unusually weak or tired Side effects that usually do not require medical attention (report to your doctor or health care professional if they continue or are bothersome): -aches or pains -chickenpox-like rash -diarrhea -headache -loss of appetite -nausea, vomiting -redness, pain, swelling at site where injected -runny nose This list may not describe all possible side effects. Call your doctor for medical advice about side effects. You may report side effects to FDA at 1-800-FDA-1088. Where should I keep my medicine? This drug is given in a hospital or clinic and will not be stored at home. NOTE: This sheet is a summary. It may not cover all possible information. If you have questions about this medicine, talk to your doctor, pharmacist, or health care provider.  2014, Elsevier/Gold Standard. (2009-12-21 17:43:50)    Arrie Senate MD

## 2013-08-05 NOTE — Patient Instructions (Addendum)
Continue current medications. Continue good therapeutic lifestyle changes which include good diet and exercise. Fall precautions discussed with patient. Schedule your flu vaccine if you haven't had it yet If you are over 72 years old - you may need Prevnar 27 or the adult Pneumonia vaccine.  Try to stop smoking completely After your surgery you would need to get a Prevnar vaccine and a Tdap You will also need a DEXA scan and a mammogram. A pelvic exam will also need to be done. Please bring home blood pressure readings  tomorrow when you get your lab work and have the nurse check your blood pressure again Also continue to monitor your blood pressure        Herpes Zoster Virus Vaccine What is this medicine? HERPES ZOSTER VIRUS VACCINE (HUR peez ZOS ter vahy ruhs vak SEEN) is a vaccine. It is used to prevent shingles in adults 72 years old and over. This vaccine is not used to treat shingles or nerve pain from shingles. This medicine may be used for other purposes; ask your health care provider or pharmacist if you have questions. COMMON BRAND NAME(S): Zostavax What should I tell my health care provider before I take this medicine? They need to know if you have any of these conditions: -cancer like leukemia or lymphoma -immune system problems or therapy -infection with fever -tuberculosis -an unusual or allergic reaction to vaccines, neomycin, gelatin, other medicines, foods, dyes, or preservatives -pregnant or trying to get pregnant -breast-feeding How should I use this medicine? This vaccine is for injection under the skin. It is given by a health care professional. Talk to your pediatrician regarding the use of this medicine in children. This medicine is not approved for use in children. Overdosage: If you think you have taken too much of this medicine contact a poison control center or emergency room at once. NOTE: This medicine is only for you. Do not share this medicine with  others. What if I miss a dose? This does not apply. What may interact with this medicine? Do not take this medicine with any of the following medications: -adalimumab -anakinra -etanercept -infliximab -medicines to treat cancer -medicines that suppress your immune system This medicine may also interact with the following medications: -immunoglobulins -steroid medicines like prednisone or cortisone This list may not describe all possible interactions. Give your health care provider a list of all the medicines, herbs, non-prescription drugs, or dietary supplements you use. Also tell them if you smoke, drink alcohol, or use illegal drugs. Some items may interact with your medicine. What should I watch for while using this medicine? Visit your doctor for regular check ups. This vaccine, like all vaccines, may not fully protect everyone. After receiving this vaccine it may be possible to pass chickenpox infection to others. Avoid people with immune system problems, pregnant women who have not had chickenpox, and newborns of women who have not had chickenpox. Talk to your doctor for more information. What side effects may I notice from receiving this medicine? Side effects that you should report to your doctor or health care professional as soon as possible: -allergic reactions like skin rash, itching or hives, swelling of the face, lips, or tongue -breathing problems -feeling faint or lightheaded, falls -fever, flu-like symptoms -pain, tingling, numbness in the hands or feet -swelling of the ankles, feet, hands -unusually weak or tired Side effects that usually do not require medical attention (report to your doctor or health care professional if they continue or are bothersome): -  aches or pains -chickenpox-like rash -diarrhea -headache -loss of appetite -nausea, vomiting -redness, pain, swelling at site where injected -runny nose This list may not describe all possible side effects.  Call your doctor for medical advice about side effects. You may report side effects to FDA at 1-800-FDA-1088. Where should I keep my medicine? This drug is given in a hospital or clinic and will not be stored at home. NOTE: This sheet is a summary. It may not cover all possible information. If you have questions about this medicine, talk to your doctor, pharmacist, or health care provider.  2014, Elsevier/Gold Standard. (2009-12-21 17:43:50)

## 2013-08-06 ENCOUNTER — Other Ambulatory Visit (INDEPENDENT_AMBULATORY_CARE_PROVIDER_SITE_OTHER): Payer: Medicare Other

## 2013-08-06 DIAGNOSIS — R5383 Other fatigue: Secondary | ICD-10-CM

## 2013-08-06 DIAGNOSIS — I10 Essential (primary) hypertension: Secondary | ICD-10-CM

## 2013-08-06 DIAGNOSIS — R5381 Other malaise: Secondary | ICD-10-CM

## 2013-08-06 DIAGNOSIS — I4891 Unspecified atrial fibrillation: Secondary | ICD-10-CM

## 2013-08-06 DIAGNOSIS — E785 Hyperlipidemia, unspecified: Secondary | ICD-10-CM

## 2013-08-06 LAB — BASIC METABOLIC PANEL WITH GFR
BUN: 18 mg/dL (ref 6–23)
CO2: 26 meq/L (ref 19–32)
CREATININE: 1.23 mg/dL — AB (ref 0.50–1.10)
Calcium: 8.1 mg/dL — ABNORMAL LOW (ref 8.4–10.5)
Chloride: 110 mEq/L (ref 96–112)
GFR, EST AFRICAN AMERICAN: 51 mL/min — AB
GFR, Est Non African American: 44 mL/min — ABNORMAL LOW
Glucose, Bld: 175 mg/dL — ABNORMAL HIGH (ref 70–99)
Potassium: 4.1 mEq/L (ref 3.5–5.3)
SODIUM: 142 meq/L (ref 135–145)

## 2013-08-06 LAB — POCT INR: INR: 2.4

## 2013-08-06 LAB — HEPATIC FUNCTION PANEL
ALK PHOS: 85 U/L (ref 39–117)
ALT: 28 U/L (ref 0–35)
AST: 26 U/L (ref 0–37)
Albumin: 3.7 g/dL (ref 3.5–5.2)
BILIRUBIN INDIRECT: 0.4 mg/dL (ref 0.0–0.9)
BILIRUBIN TOTAL: 0.5 mg/dL (ref 0.3–1.2)
Bilirubin, Direct: 0.1 mg/dL (ref 0.0–0.3)
Total Protein: 5.7 g/dL — ABNORMAL LOW (ref 6.0–8.3)

## 2013-08-07 ENCOUNTER — Telehealth: Payer: Self-pay | Admitting: Family Medicine

## 2013-08-07 LAB — CBC WITH DIFFERENTIAL
BASOS ABS: 0 10*3/uL (ref 0.0–0.2)
Basos: 0 %
EOS: 2 %
Eosinophils Absolute: 0.1 10*3/uL (ref 0.0–0.4)
HCT: 46.1 % (ref 34.0–46.6)
Hemoglobin: 15.3 g/dL (ref 11.1–15.9)
IMMATURE GRANULOCYTES: 0 %
Immature Grans (Abs): 0 10*3/uL (ref 0.0–0.1)
LYMPHS ABS: 1.3 10*3/uL (ref 0.7–3.1)
LYMPHS: 18 %
MCH: 30.5 pg (ref 26.6–33.0)
MCHC: 33.2 g/dL (ref 31.5–35.7)
MCV: 92 fL (ref 79–97)
Monocytes Absolute: 0.4 10*3/uL (ref 0.1–0.9)
Monocytes: 5 %
NEUTROS PCT: 75 %
Neutrophils Absolute: 5.4 10*3/uL (ref 1.4–7.0)
PLATELETS: 164 10*3/uL (ref 150–379)
RBC: 5.01 x10E6/uL (ref 3.77–5.28)
RDW: 15.9 % — AB (ref 12.3–15.4)
WBC: 7.3 10*3/uL (ref 3.4–10.8)

## 2013-08-07 LAB — NMR LIPOPROFILE WITH LIPIDS
Cholesterol, Total: 121 mg/dL (ref <200)
HDL Particle Number: 36.9 umol/L (ref >=30.5)
HDL Size: 8.9 nm — ABNORMAL LOW (ref >=9.2)
HDL-C: 48 mg/dL (ref >=40)
LARGE HDL: 4.8 umol/L (ref >=4.8)
LDL CALC: 46 mg/dL (ref <100)
LDL Particle Number: 936 nmol/L (ref <1000)
LDL Size: 20.1 nm — ABNORMAL LOW (ref >20.5)
LP-IR SCORE: 56 — AB (ref <=45)
Large VLDL-P: 2.5 nmol/L (ref <=2.7)
Small LDL Particle Number: 645 nmol/L — ABNORMAL HIGH (ref <=527)
TRIGLYCERIDES: 136 mg/dL (ref <150)
VLDL Size: 52.2 nm — ABNORMAL HIGH (ref <=46.6)

## 2013-08-07 LAB — VITAMIN D 25 HYDROXY (VIT D DEFICIENCY, FRACTURES): Vit D, 25-Hydroxy: 41 ng/mL (ref 30–89)

## 2013-08-07 MED ORDER — AMLODIPINE BESYLATE 5 MG PO TABS: 5.0000 mg | ORAL_TABLET | Freq: Every day | ORAL | Status: DC

## 2013-08-07 NOTE — Telephone Encounter (Signed)
Can you review please.

## 2013-08-07 NOTE — Telephone Encounter (Signed)
rx sent to [facility] per Dr Tawanna Sat order.  Pt notified and appt made for tomorrow.

## 2013-08-07 NOTE — Telephone Encounter (Signed)
Start amlodipine 5 mg one daily, call this prescription in Have her come back tomorrow for the clinical pharmacist to check the blood pressure and report back to me Make sure that she gets more blood pressure readings at home Bring additional blood pressure readings and blood pressure monitor in to visit tomorrow

## 2013-08-08 ENCOUNTER — Ambulatory Visit (INDEPENDENT_AMBULATORY_CARE_PROVIDER_SITE_OTHER): Payer: Medicare Other | Admitting: Pharmacist

## 2013-08-08 VITALS — BP 140/88 | HR 70

## 2013-08-08 DIAGNOSIS — I10 Essential (primary) hypertension: Secondary | ICD-10-CM

## 2013-08-08 NOTE — Patient Instructions (Signed)
Since you haven't started new Blood pressure medication yet - amlodipine 5mg  - and blood pressure is better today I would recommend starting amlodipine 5mg  only 1/2 tablet once a day.  Check blood pressure 1 or 2 times a day.    How to Take Your Blood Pressure  These instructions are only for electronic home blood pressure machines. You will need:   An automatic or semi-automatic blood pressure machine.  Fresh batteries for the blood pressure machine. HOW DO I USE THESE TOOLS TO CHECK MY BLOOD PRESSURE?   There are 2 numbers that make up your blood pressure. For example: 120/80.  The first number (120 in our example) is called the "systolic pressure." It is a measure of the pressure in your blood vessels when your heart is pumping blood.  The second number (80 in our example) is called the "diastolic pressure." It is a measure of the pressure in your blood vessels when your heart is resting between beats.  Before you buy a home blood pressure machine, check the size of your arm so you can buy the right size cuff. Here is how to check the size of your arm:  Use a tape measure that shows both inches and centimeters.  Wrap the tape measure around the middle upper part of your arm. You may need someone to help you measure right.  Write down your arm measurement in both inches and centimeters.  To measure your blood pressure right, it is important to have the right size cuff.  If your arm is up to 13 inches (37 to 34 centimeters), get an adult cuff size.  If your arm is 13 to 17 inches (35 to 44 centimeters), get a large adult cuff size.  If your arm is 17 to 20 inches (45 to 52 centimeters), get an adult thigh cuff.  Try to rest or relax for at least 30 minutes before you check your blood pressure.  Do not smoke.  Do not have any drinks with caffeine, such as:  Pop.  Coffee.  Tea.  Check your blood pressure in a quiet room.  Sit down and stretch out your arm on a table.  Keep your arm at about the level of your heart. Let your arm relax. GETTING BLOOD PRESSURE READINGS  Make sure you remove any tight-fighting clothing from your arm. Wrap the cuff around your upper arm. Wrap it just above the bend, and above where you felt the pulse. You should be able to slip a finger between the cuff and your arm. If you cannot slip a finger in the cuff, it is too tight and should be removed and rewrapped.  Some units requires you to manually pump up the arm cuff.  Automatic units inflate the cuff when you press a button.  Cuff deflation is automatic in both models.  After the cuff is inflated, the unit measures your blood pressure and pulse. The readings are displayed on a monitor. Hold still and breathe normally while the cuff is inflated.  Getting a reading takes less than a minute.  Some models store readings in a memory. Some provide a printout of readings.  Get readings at different times of the day. You should wait at least 5 minutes between readings. Take readings with you to your next doctor's visit. Document Released: 06/16/2008 Document Revised: 09/26/2011 Document Reviewed: 06/16/2008 [facility] Patient Information 2014 [place].

## 2013-08-08 NOTE — Progress Notes (Signed)
CC:  Hypertension  HPI:  Patient called yesterday with c/o elevated BP.  She had check at her local pharmacy and BP was 150/101 and HR was 113. Dr Laurance Flatten started amlopidine 5mg  1 tablet daily and check BP today.  However, patient has not picked up or started amlopidine yet.  Filed Vitals:   08/08/13 1451  BP: 140/88  Pulse: 70    Assessment:  HTN - BP very elevated yesterday but better today in our office even without start of amlodipine  Plan: 1.  Patient is instructed to get home BP monitor and check 1-2 times daily 2.  Will start amlopidine 5mg  but at 1/2 tablet daily today (concern with possible fluid retention with 5mg ) 3.  RTC in 1 week to recheck BP   Cherre Robins, PharmD, CPP

## 2013-08-13 ENCOUNTER — Telehealth: Payer: Self-pay | Admitting: Family Medicine

## 2013-08-13 LAB — HGB A1C W/O EAG: HEMOGLOBIN A1C: 6.1 % — AB (ref 4.8–5.6)

## 2013-08-13 LAB — SPECIMEN STATUS REPORT

## 2013-08-13 NOTE — Telephone Encounter (Signed)
Pt can not come tomorrow, but is coming thurs to see tammy- she will speak with Rorik Vespa b while she is here. Pt has started on amlodipine 2.5 daily per tammy (started on mon she thinks)

## 2013-08-13 NOTE — Telephone Encounter (Signed)
The office or bring any additional blood pressure readings and we will get started on Medication

## 2013-08-14 ENCOUNTER — Telehealth: Payer: Self-pay | Admitting: Family Medicine

## 2013-08-14 NOTE — Telephone Encounter (Signed)
Spoke with patient and she has appt tom with Tammy

## 2013-08-15 ENCOUNTER — Telehealth: Payer: Self-pay | Admitting: Family Medicine

## 2013-08-15 ENCOUNTER — Ambulatory Visit (INDEPENDENT_AMBULATORY_CARE_PROVIDER_SITE_OTHER): Payer: Medicare Other | Admitting: Pharmacist

## 2013-08-15 VITALS — Wt 159.0 lb

## 2013-08-15 DIAGNOSIS — I4891 Unspecified atrial fibrillation: Secondary | ICD-10-CM

## 2013-08-15 LAB — POCT INR: INR: 2.7

## 2013-08-15 NOTE — Patient Instructions (Addendum)
Anticoagulation Dose Instructions as of 08/15/2013     Melinda Collins Tue Wed Thu Fri Sat   New Dose 7.5 mg 10 mg 10 mg 10 mg 7.5 mg 10 mg 10 mg    Description       Continue same dose of 1 and 1/2 tablet on sundays and thursdays and 2 tablets on all other days       Goal blood pressure is less than 140 on top and less than 85 on bottom   Hypertension As your heart beats, it forces blood through your arteries. This force is your blood pressure. If the pressure is too high, it is called hypertension (HTN) or high blood pressure. HTN is dangerous because you may have it and not know it. High blood pressure may mean that your heart has to work harder to pump blood. Your arteries may be narrow or stiff. The extra work puts you at risk for heart disease, stroke, and other problems.  Blood pressure consists of two numbers, a higher number over a lower, 110/72, for example. It is stated as "110 over 72." The ideal is below 120 for the top number (systolic) and under 80 for the bottom (diastolic). Write down your blood pressure today. You should pay close attention to your blood pressure if you have certain conditions such as:  Heart failure.  Prior heart attack.  Diabetes  Chronic kidney disease.  Prior stroke.  Multiple risk factors for heart disease. To see if you have HTN, your blood pressure should be measured while you are seated with your arm held at the level of the heart. It should be measured at least twice. A one-time elevated blood pressure reading (especially in the Emergency Department) does not mean that you need treatment. There may be conditions in which the blood pressure is different between your right and left arms. It is important to see your caregiver soon for a recheck. Most people have essential hypertension which means that there is not a specific cause. This type of high blood pressure may be lowered by changing lifestyle factors such as:  Stress.  Smoking.  Lack of  exercise.  Excessive weight.  Drug/tobacco/alcohol use.  Eating less salt. Most people do not have symptoms from high blood pressure until it has caused damage to the body. Effective treatment can often prevent, delay or reduce that damage. TREATMENT  When a cause has been identified, treatment for high blood pressure is directed at the cause. There are a large number of medications to treat HTN. These fall into several categories, and your caregiver will help you select the medicines that are best for you. Medications may have side effects. You should review side effects with your caregiver. If your blood pressure stays high after you have made lifestyle changes or started on medicines,   Your medication(s) may need to be changed.  Other problems may need to be addressed.  Be certain you understand your prescriptions, and know how and when to take your medicine.  Be sure to follow up with your caregiver within the time frame advised (usually within two weeks) to have your blood pressure rechecked and to review your medications.  If you are taking more than one medicine to lower your blood pressure, make sure you know how and at what times they should be taken. Taking two medicines at the same time can result in blood pressure that is too low. SEEK IMMEDIATE MEDICAL CARE IF:  You develop a severe headache, blurred  or changing vision, or confusion.  You have unusual weakness or numbness, or a faint feeling.  You have severe chest or abdominal pain, vomiting, or breathing problems. MAKE SURE YOU:   Understand these instructions.  Will watch your condition.  Will get help right away if you are not doing well or get worse. Document Released: 07/04/2005 Document Revised: 09/26/2011 Document Reviewed: 02/22/2008 [facility] Patient Information 2014 [place].

## 2013-08-15 NOTE — Telephone Encounter (Signed)
Patient called - INR was 2.7.

## 2013-08-15 NOTE — Progress Notes (Signed)
Patient to have surgery 09/03/13.   She is given schedule for discontinuing warfarin prior to surgery and for bridging with Lovenox Will stop warfarin 08/26/13 - start lovenox 80mg  SQ q12h on Wednesday, 08/28/13. Copy of plan given to patient  Amlodipine 2.5mg  was started last week. BP still elevated today and home readings are elevated.  Increase amlopidine to 5mg  1 tablet daily

## 2013-08-22 ENCOUNTER — Other Ambulatory Visit: Payer: Self-pay | Admitting: Family Medicine

## 2013-08-29 ENCOUNTER — Ambulatory Visit (INDEPENDENT_AMBULATORY_CARE_PROVIDER_SITE_OTHER): Payer: Medicare Other | Admitting: Pharmacist

## 2013-08-29 DIAGNOSIS — I4891 Unspecified atrial fibrillation: Secondary | ICD-10-CM

## 2013-08-29 LAB — POCT INR: INR: 1.3

## 2013-08-29 NOTE — Patient Instructions (Signed)
Anticoagulation Dose Instructions as of 08/29/2013     Melinda Collins Tue Wed Thu Fri Sat   New Dose 7.5 mg 10 mg 10 mg 10 mg 7.5 mg 10 mg 10 mg    Description       Continue to hold warfarin and use enoxaparin / Lovenox as directed. Recommend having home health come out after surgery to do protimes.      INR was 1.3 today (expected result since holding warfarin)

## 2013-09-04 ENCOUNTER — Telehealth: Payer: Self-pay | Admitting: Pharmacist

## 2013-09-04 NOTE — Telephone Encounter (Signed)
Surgery was cancelled because of UTI.  It was rescheduled for October 15, 2013. She did bridging therapy prior to surgery that was scheduled 09/03/13.  She is out of Lovenox - she took 5mg  of warfarin yesterday.  Recommend restart warfarin today 10mg  today and tomorrow, then restart regular dose of 7.5mg  Sundays and Thursdays and 2 tablets all other days.

## 2013-09-09 ENCOUNTER — Ambulatory Visit (INDEPENDENT_AMBULATORY_CARE_PROVIDER_SITE_OTHER): Payer: Medicare Other | Admitting: Pharmacist

## 2013-09-09 DIAGNOSIS — I4891 Unspecified atrial fibrillation: Secondary | ICD-10-CM

## 2013-09-09 LAB — POCT INR: INR: 1.6

## 2013-09-09 NOTE — Patient Instructions (Signed)
Anticoagulation Dose Instructions as of 09/09/2013     Dorene Grebe Tue Wed Thu Fri Sat   New Dose 7.5 mg 10 mg 10 mg 10 mg 7.5 mg 10 mg 10 mg    Description       Take 3 tablets today then restart regular dose of 2 tablets daily except on Sundays and Thursdays take 1 and 1/2 tablets.       INR was 1.6 today

## 2013-09-14 ENCOUNTER — Other Ambulatory Visit: Payer: Self-pay | Admitting: Family Medicine

## 2013-09-18 ENCOUNTER — Ambulatory Visit (INDEPENDENT_AMBULATORY_CARE_PROVIDER_SITE_OTHER): Payer: Medicare Other | Admitting: Pharmacist

## 2013-09-18 DIAGNOSIS — I4891 Unspecified atrial fibrillation: Secondary | ICD-10-CM

## 2013-09-18 LAB — POCT INR: INR: 2.4

## 2013-09-18 MED ORDER — ENOXAPARIN SODIUM 80 MG/0.8ML ~~LOC~~ SOLN: 80.0000 mg | Freq: Two times a day (BID) | SUBCUTANEOUS | Status: DC

## 2013-09-18 NOTE — Patient Instructions (Signed)
Anticoagulation Dose Instructions as of 09/18/2013     Melinda Collins Tue Wed Thu Fri Sat   New Dose 7.5 mg 10 mg 10 mg 10 mg 7.5 mg 10 mg 10 mg    Description       Continue dose of 2 tablets daily except on Sundays and Thursdays take 1 and 1/2 tablets.       INR was 2.4 today

## 2013-10-03 ENCOUNTER — Ambulatory Visit (INDEPENDENT_AMBULATORY_CARE_PROVIDER_SITE_OTHER): Payer: Medicare Other | Admitting: Pharmacist

## 2013-10-03 DIAGNOSIS — I4891 Unspecified atrial fibrillation: Secondary | ICD-10-CM

## 2013-10-03 LAB — POCT INR: INR: 3.2

## 2013-10-03 NOTE — Patient Instructions (Signed)
Anticoagulation Dose Instructions as of 10/03/2013     Sun Mon Tue Wed Thu Fri Sat   New Dose 7.5 mg 10 mg 10 mg 10 mg 7.5 mg 10 mg 10 mg    Description       No warfarin today then continue dose of 2 tablets daily except on Sundays and Thursdays take 1 and 1/2 tablets.       INR was 3.2 today

## 2013-10-07 ENCOUNTER — Other Ambulatory Visit: Payer: Self-pay | Admitting: Family Medicine

## 2013-10-22 ENCOUNTER — Telehealth: Payer: Self-pay | Admitting: Family Medicine

## 2013-10-31 ENCOUNTER — Telehealth: Payer: Self-pay | Admitting: *Deleted

## 2013-10-31 ENCOUNTER — Ambulatory Visit (INDEPENDENT_AMBULATORY_CARE_PROVIDER_SITE_OTHER): Payer: Medicare Other | Admitting: Pharmacist

## 2013-10-31 DIAGNOSIS — I4891 Unspecified atrial fibrillation: Secondary | ICD-10-CM

## 2013-10-31 LAB — POCT INR: INR: 2.4

## 2013-10-31 NOTE — Telephone Encounter (Signed)
Instructed Melinda Collins to have patient restart regular warfarin dose of 10mg  daily (= 2 tablets) except 7.5mg  (1 and 1/2 tablets) on tuesdays and sundays.  Recheck INR on Tuesday 11/05/13

## 2013-10-31 NOTE — Progress Notes (Signed)
No charge for labs or visit - INR check through home monitoring system  

## 2013-10-31 NOTE — Telephone Encounter (Signed)
INR 2.4  Protime 28.9 Was released from Nursing home on Tuesday taking 12.5mg  x 2 days

## 2013-11-04 ENCOUNTER — Other Ambulatory Visit: Payer: Self-pay | Admitting: Family Medicine

## 2013-11-04 ENCOUNTER — Telehealth: Payer: Self-pay | Admitting: Family Medicine

## 2013-11-04 MED ORDER — NICOTINE 14 MG/24HR TD PT24: 14.0000 mg | MEDICATED_PATCH | Freq: Every day | TRANSDERMAL | Status: DC

## 2013-11-05 ENCOUNTER — Ambulatory Visit: Payer: Medicare Other | Admitting: Family Medicine

## 2013-11-05 ENCOUNTER — Telehealth: Payer: Self-pay | Admitting: *Deleted

## 2013-11-05 NOTE — Telephone Encounter (Signed)
INR 3.9   Pt takes 10mg  qd, except 7.5 on Sun and Thurs. Please call [facility] nurse and pt.

## 2013-11-05 NOTE — Telephone Encounter (Signed)
Called Linda at [employer] with following instructions:  Hold Coumadin today then change to 10mg  M,W,F and 7.5mg  all other days of the week.  Recheck in 5-7 days.  Nurse is at patient's house and will relay directions to her.

## 2013-11-12 ENCOUNTER — Telehealth: Payer: Self-pay | Admitting: *Deleted

## 2013-11-12 NOTE — Telephone Encounter (Signed)
Need to hold meds today and will readdress tomorrow- Please verify dose  Of meds

## 2013-11-12 NOTE — Telephone Encounter (Signed)
Per Vaughan Basta at [facility] she held warfarin on 4/21 then took 7.5mg  tue, thurs, sat, sun and then 10 mg all other days. Patient is aware to hold today

## 2013-11-12 NOTE — Telephone Encounter (Signed)
INR 4.0 Protime 48.1 Pt takes 7.5 mg on Tues, Thurs, Sat and Sun. Takes 10mg  all other days. [facility] or pharmacist not here

## 2013-11-12 NOTE — Telephone Encounter (Signed)
Hold Tuesday and Wednesday then back  To regular dose and recheck on Friday

## 2013-11-13 ENCOUNTER — Telehealth: Payer: Self-pay | Admitting: Family Medicine

## 2013-11-13 NOTE — Telephone Encounter (Signed)
Patient called to confirm directions for warfarin. INR was checked yesterday and was 4.0.  Patient did not take any warfarin yestereday - I instructed her to take 1/2 tablet today then start 10mg  Mondays, Wednesdays and Fridays and 7.5mg  on Sundays, Tuesdays, THursdays and Saturdays.   Plan is to recheck INR on Friday per [facility] Martin's note.

## 2013-11-14 NOTE — Telephone Encounter (Signed)
Detailed message was left on Home Health Nurses voicemail yesterday.

## 2013-11-23 ENCOUNTER — Other Ambulatory Visit: Payer: Self-pay | Admitting: Family Medicine

## 2013-11-27 ENCOUNTER — Telehealth: Payer: Self-pay | Admitting: Pharmacist

## 2013-11-27 NOTE — Telephone Encounter (Signed)
I called patient because INR / Protime recheck is past due.   Home Health has been coming out to check.  Patient states that they came about 1-2 weeks ago and they are coming today.  She has asked that this be their last visit because she would like to start coming back into office for checked now that she is able.  INR to be check by [facility] today - will call patient with adjustment when results known

## 2013-11-28 ENCOUNTER — Ambulatory Visit (INDEPENDENT_AMBULATORY_CARE_PROVIDER_SITE_OTHER): Payer: Medicare Other | Admitting: Pharmacist

## 2013-11-28 DIAGNOSIS — I4891 Unspecified atrial fibrillation: Secondary | ICD-10-CM

## 2013-11-28 LAB — POCT INR: INR: 2.9

## 2013-11-28 NOTE — Patient Instructions (Signed)
Anticoagulation Dose Instructions as of 11/28/2013     Melinda Collins Tue Wed Thu Fri Sat   New Dose 7.5 mg 10 mg 7.5 mg 10 mg 7.5 mg 10 mg 7.5 mg    Description       Continue current warfarin dose of 10mg  (2 tablets) on mondays, wednesdays and fridays.  7.5mg  or 1 and 1/2 tablets all other days.      INR was 2.9 today

## 2013-12-03 ENCOUNTER — Ambulatory Visit (INDEPENDENT_AMBULATORY_CARE_PROVIDER_SITE_OTHER): Payer: Medicare Other | Admitting: Family Medicine

## 2013-12-03 ENCOUNTER — Encounter: Payer: Self-pay | Admitting: Family Medicine

## 2013-12-03 VITALS — BP 120/72 | HR 103 | Temp 98.0°F | Ht 63.5 in | Wt 158.0 lb

## 2013-12-03 DIAGNOSIS — E559 Vitamin D deficiency, unspecified: Secondary | ICD-10-CM

## 2013-12-03 DIAGNOSIS — Z9889 Other specified postprocedural states: Secondary | ICD-10-CM

## 2013-12-03 DIAGNOSIS — I10 Essential (primary) hypertension: Secondary | ICD-10-CM

## 2013-12-03 DIAGNOSIS — E8881 Metabolic syndrome: Secondary | ICD-10-CM

## 2013-12-03 DIAGNOSIS — I4891 Unspecified atrial fibrillation: Secondary | ICD-10-CM

## 2013-12-03 DIAGNOSIS — Z8542 Personal history of malignant neoplasm of other parts of uterus: Secondary | ICD-10-CM

## 2013-12-03 DIAGNOSIS — E785 Hyperlipidemia, unspecified: Secondary | ICD-10-CM

## 2013-12-03 LAB — POCT CBC
Granulocyte percent: 77.4 %G (ref 37–80)
HEMATOCRIT: 38.6 % (ref 37.7–47.9)
HEMOGLOBIN: 12.5 g/dL (ref 12.2–16.2)
Lymph, poc: 1.5 (ref 0.6–3.4)
MCH: 29.4 pg (ref 27–31.2)
MCHC: 32.3 g/dL (ref 31.8–35.4)
MCV: 91 fL (ref 80–97)
MPV: 7.6 fL (ref 0–99.8)
POC Granulocyte: 6.2 (ref 2–6.9)
POC LYMPH PERCENT: 19.2 %L (ref 10–50)
Platelet Count, POC: 227 10*3/uL (ref 142–424)
RBC: 4.2 M/uL (ref 4.04–5.48)
RDW, POC: 17.6 %
WBC: 8 10*3/uL (ref 4.6–10.2)

## 2013-12-03 LAB — POCT GLYCOSYLATED HEMOGLOBIN (HGB A1C): Hemoglobin A1C: 5.3

## 2013-12-03 NOTE — Progress Notes (Addendum)
Subjective:    Patient ID: Melinda Collins, female    DOB: Aug 22, 1940, 73 y.o.   MRN: [id number]  HPI Pt here for follow up and management of chronic medical problems. Today the patient finally had her back surgery and she is recuperating from that. She is having much less leg pain. She is very happy with the results of her surgery. She is still having some back pain secondary to the surgery but has further plans for rehabilitation. On health maintenance issues she is due to get an FOBT today. She will schedule her mammogram in August. We will wait for a few  months before we do her pelvic exam. She is also due to receive a DTaP.          Patient Active Problem List   Diagnosis Date Noted  . Chronic low back pain 05/23/2013  . High risk medication use 05/23/2013  . Swelling of limb 05/03/2013  . A-fib 10/24/2012  . Preop cardiovascular exam 10/26/2011  . Hypertension 05/17/2011  . DYSPNEA 04/20/2010  . CHEST PAIN 10/08/2009  . HYPERLIPIDEMIA 02/10/2009  . TOBACCO ABUSE 02/10/2009  . 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 Atrial fibrillation 02/10/2009   Outpatient Encounter Prescriptions as of 12/03/2013  Medication Sig  . albuterol (VENTOLIN HFA) 108 (90 BASE) MCG/ACT inhaler Inhale 1 puff into the lungs every 4 (four) hours as needed for wheezing.  Marland Kitchen amLODipine (NORVASC) 5 MG tablet TAKE ONE TABLET BY MOUTH ONCE DAILY  . atorvastatin (LIPITOR) 80 MG tablet TAKE ONE TABLET BY MOUTH DAILY  . COUMADIN 5 MG tablet TAKE 1 TO 2 TABLETS BY MOUTH DAILY AS DIRECTED BY ANTICOAGULATION CLINIC  . Multiple Vitamins-Minerals (CENTRUM SILVER PO) Take 1 tablet by mouth daily.    . nicotine (NICODERM CQ - DOSED IN MG/24 HOURS) 14 mg/24hr patch Place 1 patch (14 mg total) onto the skin daily.  . NON FORMULARY Oxygen 3L/min   . oxyCODONE-acetaminophen (PERCOCET) 5-325 MG per tablet Take 2 tablets by mouth every 8 (eight) hours as needed.    Marland Kitchen PARoxetine (PAXIL) 40 MG tablet TAKE ONE TABLET  BY MOUTH DAILY  . torsemide (DEMADEX) 20 MG tablet TAKE ONE & ONE-HALF (1 & 1/2) TABLETS BY MOUTH ONCE DAILY  . [DISCONTINUED] enoxaparin (LOVENOX) 80 MG/0.8ML injection Inject 0.8 mLs (80 mg total) into the skin every 12 (twelve) hours.    Review of Systems  Constitutional: Negative.   HENT: Negative.   Eyes: Negative.   Respiratory: Negative.   Cardiovascular: Negative.   Gastrointestinal: Negative.   Endocrine: Negative.   Genitourinary: Negative.   Musculoskeletal: Positive for back pain (had back surgery on 10/15/13- leg pain is much better).  Skin: Negative.   Allergic/Immunologic: Negative.   Neurological: Negative.   Hematological: Negative.   Psychiatric/Behavioral: Negative.        Objective:   Physical Exam  Nursing note and vitals reviewed. Constitutional: She is oriented to person, place, and time. She appears well-developed and well-nourished. No distress.  HENT:  Head: Normocephalic and atraumatic.  Right Ear: External ear normal.  Left Ear: External ear normal.  Nose: Nose normal.  Mouth/Throat: Oropharynx is clear and moist. No oropharyngeal exudate.  Eyes: Conjunctivae and EOM are normal. Pupils are equal, round, and reactive to light. Right eye exhibits no discharge. Left eye exhibits no discharge. No scleral icterus.  Neck: Normal range of motion. Neck supple. No thyromegaly present.  Cardiovascular: Normal rate, normal heart sounds and intact distal pulses.   No murmur heard.  The rhythm is only slightly irregular irregular today at 84 per min  Pulmonary/Chest: Effort normal and breath sounds normal. No respiratory distress. She has no wheezes. She has no rales. She exhibits no tenderness.  Slight tightness with coughing  Abdominal: Soft. Bowel sounds are normal. She exhibits no mass. There is no tenderness. There is no rebound and no guarding.  Musculoskeletal: Normal range of motion. She exhibits no edema and no tenderness.  The patient's ability to  arise from a sitting position and put herself on the exam table was very good with minimal discomfort. There was some discomfort when she went from sitting to   Lymphadenopathy:    She has no cervical adenopathy.  Neurological: She is alert and oriented to person, place, and time. She has normal reflexes. No cranial nerve deficit.  See above note  Skin: Skin is warm and dry. No rash noted.  Psychiatric: She has a normal mood and affect. Her behavior is normal. Judgment and thought content normal.   BP 120/72  Pulse 103  Temp(Src) 98 F (36.7 C) (Oral)  Ht 5' 3.5" (1.613 m)  Wt 158 lb (71.668 kg)  BMI 27.55 kg/m2        Assessment & Plan:  1. Atrial fibrillation - POCT CBC  2. HYPERLIPIDEMIA - POCT CBC - Lipid panel  3. Hypertension - BMP8+EGFR - POCT CBC - Hepatic function panel  4. Vitamin D deficiency - POCT CBC - Vit D  25 hydroxy (rtn osteoporosis monitoring)  5. Metabolic syndrome - POCT glycosylated hemoglobin (Hb A1C) - POCT CBC  6. History of uterine cancer  7. History of lumbar surgery   Patient Instructions                       Medicare Annual Wellness Visit  [facility] and the medical providers at [facility] strive to bring you the best medical care.  In doing so we not only want to address your current medical conditions and concerns but also to detect new conditions early and prevent illness, disease and health-related problems.    Medicare offers a yearly Wellness Visit which allows our clinical staff to assess your need for preventative services including immunizations, lifestyle education, counseling to decrease risk of preventable diseases and screening for fall risk and other medical concerns.    This visit is provided free of charge (no copay) for all Medicare recipients. The clinical pharmacists at [facility] have begun to conduct these Wellness Visits which will also include a thorough review  of all your medications.    As you primary medical provider recommend that you make an appointment for your Annual Wellness Visit if you have not done so already this year.  You may set up this appointment before you leave today or you may call back [phone]) and schedule an appointment.  Please make sure when you call that you mention that you are scheduling your Annual Wellness Visit with the clinical pharmacist so that the appointment may be made for the proper length of time.       Continue current medications. Continue good therapeutic lifestyle changes which include good diet and exercise. Fall precautions discussed with patient. If an FOBT was given today- please return it to our front desk. If you are over 39 years old - you may need Prevnar 41 or the adult Pneumonia vaccine.  Return the FOBT We will call you with the results of the lab  work once those results are available  we will schedule your pelvic exam and DEXA scan in 4 months Continue physical therapy as planned by the surgeon Do not start smoking again Start back on your Symbicort as this may help your breathing, 2 puffs twice a day, rinse mouth after use    Arrie Senate MD

## 2013-12-03 NOTE — Patient Instructions (Addendum)
Medicare Annual Wellness Visit  [facility] and the medical providers at [facility] strive to bring you the best medical care.  In doing so we not only want to address your current medical conditions and concerns but also to detect new conditions early and prevent illness, disease and health-related problems.    Medicare offers a yearly Wellness Visit which allows our clinical staff to assess your need for preventative services including immunizations, lifestyle education, counseling to decrease risk of preventable diseases and screening for fall risk and other medical concerns.    This visit is provided free of charge (no copay) for all Medicare recipients. The clinical pharmacists at [facility] have begun to conduct these Wellness Visits which will also include a thorough review of all your medications.    As you primary medical provider recommend that you make an appointment for your Annual Wellness Visit if you have not done so already this year.  You may set up this appointment before you leave today or you may call back [phone] and schedule an appointment.  Please make sure when you call that you mention that you are scheduling your Annual Wellness Visit with the clinical pharmacist so that the appointment may be made for the proper length of time.       Continue current medications. Continue good therapeutic lifestyle changes which include good diet and exercise. Fall precautions discussed with patient. If an FOBT was given today- please return it to our front desk. If you are over 72 years old - you may need Prevnar 67 or the adult Pneumonia vaccine.  Return the FOBT We will call you with the results of the lab work once those results are available  we will schedule your pelvic exam and DEXA scan in 4 months Continue physical therapy as planned by the surgeon Do not start smoking again Start back on your Symbicort  as this may help your breathing, 2 puffs twice a day, rinse mouth after use

## 2013-12-04 LAB — HEPATIC FUNCTION PANEL
ALK PHOS: 98 IU/L (ref 39–117)
ALT: 21 IU/L (ref 0–32)
AST: 19 IU/L (ref 0–40)
Albumin: 4.2 g/dL (ref 3.5–4.8)
BILIRUBIN DIRECT: 0.14 mg/dL (ref 0.00–0.40)
Total Bilirubin: 0.4 mg/dL (ref 0.0–1.2)
Total Protein: 6.1 g/dL (ref 6.0–8.5)

## 2013-12-04 LAB — BMP8+EGFR
BUN/Creatinine Ratio: 21 (ref 11–26)
BUN: 30 mg/dL — AB (ref 8–27)
CALCIUM: 9.3 mg/dL (ref 8.7–10.3)
CHLORIDE: 104 mmol/L (ref 97–108)
CO2: 25 mmol/L (ref 18–29)
Creatinine, Ser: 1.45 mg/dL — ABNORMAL HIGH (ref 0.57–1.00)
GFR calc Af Amer: 42 mL/min/{1.73_m2} — ABNORMAL LOW (ref >59)
GFR, EST NON AFRICAN AMERICAN: 36 mL/min/{1.73_m2} — AB (ref >59)
Glucose: 100 mg/dL — ABNORMAL HIGH (ref 65–99)
POTASSIUM: 5 mmol/L (ref 3.5–5.2)
Sodium: 144 mmol/L (ref 134–144)

## 2013-12-04 LAB — LIPID PANEL
CHOLESTEROL TOTAL: 167 mg/dL (ref 100–199)
Chol/HDL Ratio: 2.9 ratio units (ref 0.0–4.4)
HDL: 58 mg/dL (ref >39)
LDL Calculated: 61 mg/dL (ref 0–99)
TRIGLYCERIDES: 241 mg/dL — AB (ref 0–149)
VLDL Cholesterol Cal: 48 mg/dL — ABNORMAL HIGH (ref 5–40)

## 2013-12-04 LAB — VITAMIN D 25 HYDROXY (VIT D DEFICIENCY, FRACTURES): Vit D, 25-Hydroxy: 25.8 ng/mL — ABNORMAL LOW (ref 30.0–100.0)

## 2013-12-19 ENCOUNTER — Ambulatory Visit (INDEPENDENT_AMBULATORY_CARE_PROVIDER_SITE_OTHER): Payer: Medicare Other | Admitting: Pharmacist

## 2013-12-19 DIAGNOSIS — I4891 Unspecified atrial fibrillation: Secondary | ICD-10-CM

## 2013-12-19 LAB — POCT INR: INR: 3.3

## 2013-12-19 NOTE — Patient Instructions (Signed)
Anticoagulation Dose Instructions as of 12/19/2013     Sun Mon Tue Wed Thu Fri Sat   New Dose 7.5 mg 10 mg 7.5 mg 10 mg 7.5 mg 10 mg 7.5 mg    Description       No warfarin today (12/19/13) then restart regular warfarin dose of 2 tablets on mondays, wednesdays and fridays.  1 and 1/2 tablets all other days      INR was 3.3 today

## 2013-12-25 ENCOUNTER — Other Ambulatory Visit: Payer: Self-pay | Admitting: Family Medicine

## 2013-12-30 ENCOUNTER — Other Ambulatory Visit: Payer: Self-pay | Admitting: Family Medicine

## 2014-01-02 ENCOUNTER — Ambulatory Visit (INDEPENDENT_AMBULATORY_CARE_PROVIDER_SITE_OTHER): Payer: Medicare Other | Admitting: Pharmacist

## 2014-01-02 DIAGNOSIS — I4891 Unspecified atrial fibrillation: Secondary | ICD-10-CM

## 2014-01-02 LAB — POCT INR: INR: 2.6

## 2014-01-02 NOTE — Patient Instructions (Signed)
Anticoagulation Dose Instructions as of 01/02/2014     Melinda Collins Tue Wed Thu Fri Sat   New Dose 7.5 mg 10 mg 7.5 mg 10 mg 7.5 mg 10 mg 7.5 mg    Description       Continue regular warfarin dose of 2 tablets on mondays, wednesdays and fridays.  1 and 1/2 tablets all other days      INR was 2.6 today

## 2014-01-27 ENCOUNTER — Other Ambulatory Visit: Payer: Self-pay | Admitting: Family Medicine

## 2014-02-03 ENCOUNTER — Ambulatory Visit (INDEPENDENT_AMBULATORY_CARE_PROVIDER_SITE_OTHER): Payer: Medicare Other | Admitting: Pharmacist

## 2014-02-03 DIAGNOSIS — R5381 Other malaise: Secondary | ICD-10-CM

## 2014-02-03 DIAGNOSIS — I4891 Unspecified atrial fibrillation: Secondary | ICD-10-CM

## 2014-02-03 DIAGNOSIS — R5383 Other fatigue: Secondary | ICD-10-CM

## 2014-02-03 LAB — POCT CBC
GRANULOCYTE PERCENT: 76 % (ref 37–80)
HCT, POC: 46.6 % (ref 37.7–47.9)
Hemoglobin: 14.7 g/dL (ref 12.2–16.2)
Lymph, poc: 1.7 (ref 0.6–3.4)
MCH, POC: 28.6 pg (ref 27–31.2)
MCHC: 31.6 g/dL — AB (ref 31.8–35.4)
MCV: 90.5 fL (ref 80–97)
MPV: 8.2 fL (ref 0–99.8)
POC GRANULOCYTE: 7.3 — AB (ref 2–6.9)
POC LYMPH %: 18 % (ref 10–50)
Platelet Count, POC: 194 10*3/uL (ref 142–424)
RBC: 5.1 M/uL (ref 4.04–5.48)
RDW, POC: 15.2 %
WBC: 9.6 10*3/uL (ref 4.6–10.2)

## 2014-02-03 LAB — POCT INR: INR: 2.5

## 2014-02-03 NOTE — Patient Instructions (Signed)
Anticoagulation Dose Instructions as of 02/03/2014     Melinda Collins Tue Wed Thu Fri Sat   New Dose 7.5 mg 10 mg 7.5 mg 10 mg 7.5 mg 10 mg 7.5 mg    Description       Continue regular warfarin dose of 2 tablets on mondays, wednesdays and fridays.  1 and 1/2 tablets all other days      INR was 2.5 today

## 2014-02-04 LAB — BMP8+EGFR
BUN / CREAT RATIO: 18 (ref 11–26)
BUN: 28 mg/dL — AB (ref 8–27)
CO2: 24 mmol/L (ref 18–29)
Calcium: 9.1 mg/dL (ref 8.7–10.3)
Chloride: 101 mmol/L (ref 97–108)
Creatinine, Ser: 1.52 mg/dL — ABNORMAL HIGH (ref 0.57–1.00)
GFR calc Af Amer: 39 mL/min/{1.73_m2} — ABNORMAL LOW (ref >59)
GFR calc non Af Amer: 34 mL/min/{1.73_m2} — ABNORMAL LOW (ref >59)
Glucose: 115 mg/dL — ABNORMAL HIGH (ref 65–99)
Potassium: 4.2 mmol/L (ref 3.5–5.2)
Sodium: 142 mmol/L (ref 134–144)

## 2014-02-06 ENCOUNTER — Telehealth: Payer: Self-pay | Admitting: Pharmacist

## 2014-02-06 NOTE — Telephone Encounter (Signed)
Patient called with lab results from Monday 02/03/14

## 2014-02-19 ENCOUNTER — Telehealth: Payer: Self-pay | Admitting: Pharmacist

## 2014-02-19 NOTE — Telephone Encounter (Signed)
Ok to hold the day before tooth extraction although not necessary.  Restart warfarin after extraction at usual dose.  Due to check INR 02/27/14.  Patient called and above discussed.

## 2014-02-24 ENCOUNTER — Other Ambulatory Visit: Payer: Self-pay | Admitting: Family Medicine

## 2014-02-27 ENCOUNTER — Ambulatory Visit (INDEPENDENT_AMBULATORY_CARE_PROVIDER_SITE_OTHER): Payer: Medicare Other | Admitting: Pharmacist

## 2014-02-27 DIAGNOSIS — I4891 Unspecified atrial fibrillation: Secondary | ICD-10-CM

## 2014-02-27 LAB — POCT INR: INR: 2

## 2014-02-27 NOTE — Patient Instructions (Signed)
Anticoagulation Dose Instructions as of 02/27/2014     Melinda Collins Tue Wed Thu Fri Sat   New Dose 7.5 mg 10 mg 7.5 mg 10 mg 7.5 mg 10 mg 7.5 mg    Description       Continue regular warfarin dose of 2 tablets on mondays, wednesdays and fridays.  1 and 1/2 tablets all other days      INR was 2.0 today

## 2014-03-03 ENCOUNTER — Other Ambulatory Visit: Payer: Self-pay | Admitting: Family Medicine

## 2014-03-12 ENCOUNTER — Encounter: Payer: Self-pay | Admitting: Family Medicine

## 2014-03-12 ENCOUNTER — Ambulatory Visit (INDEPENDENT_AMBULATORY_CARE_PROVIDER_SITE_OTHER): Payer: Medicare Other | Admitting: Family Medicine

## 2014-03-12 VITALS — BP 139/83 | HR 96 | Temp 96.9°F | Ht 63.5 in | Wt 156.0 lb

## 2014-03-12 DIAGNOSIS — I4891 Unspecified atrial fibrillation: Secondary | ICD-10-CM

## 2014-03-12 DIAGNOSIS — E559 Vitamin D deficiency, unspecified: Secondary | ICD-10-CM

## 2014-03-12 DIAGNOSIS — J301 Allergic rhinitis due to pollen: Secondary | ICD-10-CM

## 2014-03-12 DIAGNOSIS — M5441 Lumbago with sciatica, right side: Secondary | ICD-10-CM

## 2014-03-12 DIAGNOSIS — E785 Hyperlipidemia, unspecified: Secondary | ICD-10-CM

## 2014-03-12 DIAGNOSIS — E8881 Metabolic syndrome: Secondary | ICD-10-CM

## 2014-03-12 DIAGNOSIS — M5430 Sciatica, unspecified side: Secondary | ICD-10-CM

## 2014-03-12 DIAGNOSIS — I482 Chronic atrial fibrillation, unspecified: Secondary | ICD-10-CM

## 2014-03-12 DIAGNOSIS — I10 Essential (primary) hypertension: Secondary | ICD-10-CM

## 2014-03-12 LAB — POCT INR: INR: 1.9

## 2014-03-12 LAB — POCT GLYCOSYLATED HEMOGLOBIN (HGB A1C): Hemoglobin A1C: 5.6

## 2014-03-12 MED ORDER — AZELASTINE HCL 0.1 % NA SOLN: 1.0000 | Freq: Two times a day (BID) | NASAL | Status: DC

## 2014-03-12 MED ORDER — NICOTINE 14 MG/24HR TD PT24
14.0000 mg | MEDICATED_PATCH | Freq: Every day | TRANSDERMAL | Status: DC
Start: 2014-03-12 — End: 2014-08-28

## 2014-03-12 MED ORDER — FLUTICASONE PROPIONATE 50 MCG/ACT NA SUSP: 2.0000 | Freq: Every day | NASAL | Status: DC

## 2014-03-12 NOTE — Progress Notes (Signed)
Subjective:    Patient ID: Melinda Collins, female    DOB: 07-13-1941, 73 y.o.   MRN: [id number]  HPI Pt here for follow up and management of chronic medical problems. The patient has seasonal allergies and is complaining with some head congestion. She is still smoking and requests that we give her some nicotine patches to help with her smoking cessation. The patient is in good spirits and has a positive attitude. She plans to check back with the neurosurgeon regarding her back in September. The left side of her back is no longer hurting her but she is hurting on the right side and down the right leg.        Patient Active Problem List   Diagnosis Date Noted  . Metabolic syndrome 94/70/9628  . History of uterine cancer 12/03/2013  . Chronic low back pain 05/23/2013  . High risk medication use 05/23/2013  . Swelling of limb 05/03/2013  . A-fib 10/24/2012  . Preop cardiovascular exam 10/26/2011  . Hypertension 05/17/2011  . DYSPNEA 04/20/2010  . CHEST PAIN 10/08/2009  . HYPERLIPIDEMIA 02/10/2009  . TOBACCO ABUSE 02/10/2009  . 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 Atrial fibrillation 02/10/2009   Outpatient Encounter Prescriptions as of 03/12/2014  Medication Sig  . amLODipine (NORVASC) 5 MG tablet TAKE ONE TABLET BY MOUTH ONCE DAILY  . atorvastatin (LIPITOR) 80 MG tablet TAKE ONE TABLET BY MOUTH DAILY  . COUMADIN 5 MG tablet TAKE 1 TO 2 TABLETS BY MOUTH AS DIRECTED BY ANTICOAGULATION CLINIC  . Multiple Vitamins-Minerals (CENTRUM SILVER PO) Take 1 tablet by mouth daily.    . nicotine (NICODERM CQ - DOSED IN MG/24 HOURS) 14 mg/24hr patch Place 1 patch (14 mg total) onto the skin daily.  . NON FORMULARY Oxygen 3L/min   . oxyCODONE-acetaminophen (PERCOCET) 5-325 MG per tablet Take 2 tablets by mouth every 8 (eight) hours as needed.    Marland Kitchen PARoxetine (PAXIL) 40 MG tablet TAKE ONE TABLET BY MOUTH DAILY  . penicillin v potassium (VEETID) 500 MG tablet   . PROAIR HFA 108 (90 BASE)  MCG/ACT inhaler INHALE ONE PUFF INTO THE LUNGS FOUR TIMES DAILY AS NEEDED FOR WHEEZING  . torsemide (DEMADEX) 20 MG tablet TAKE 1 AND 1/2 TABLETS BY MOUTH ONCE DAILY    Review of Systems  Constitutional: Negative.   HENT: Negative.        Seasonal allergies  Eyes: Negative.   Respiratory: Negative.   Cardiovascular: Negative.   Gastrointestinal: Negative.   Endocrine: Negative.   Genitourinary: Negative.   Musculoskeletal: Negative.   Skin: Negative.   Allergic/Immunologic: Negative.   Neurological: Negative.   Hematological: Negative.   Psychiatric/Behavioral: Negative.        Objective:   Physical Exam  Nursing note and vitals reviewed. Constitutional: She is oriented to person, place, and time. She appears well-developed and well-nourished. No distress.  HENT:  Head: Normocephalic and atraumatic.  Right Ear: External ear normal.  Left Ear: External ear normal.  Mouth/Throat: Oropharynx is clear and moist. No oropharyngeal exudate.  Nasal pallor  Eyes: Conjunctivae and EOM are normal. Pupils are equal, round, and reactive to light. Right eye exhibits no discharge. Left eye exhibits no discharge. No scleral icterus.  Neck: Normal range of motion. Neck supple. No thyromegaly present.  No carotid bruits  Cardiovascular: Normal rate, normal heart sounds and intact distal pulses.  Exam reveals no gallop and no friction rub.   No murmur heard. Irregular irregular at 84-96 per minute  Pulmonary/Chest: Effort  normal. No respiratory distress. She has wheezes. She has no rales. She exhibits no tenderness.  Breath sounds are distant with minimal wheezes. She has a tight cough  Abdominal: Soft. Bowel sounds are normal. She exhibits no mass. There is no tenderness. There is no rebound and no guarding.  Musculoskeletal: Normal range of motion. She exhibits no edema and no tenderness.  Lymphadenopathy:    She has no cervical adenopathy.  Neurological: She is alert and oriented to  person, place, and time. She has normal reflexes. No cranial nerve deficit.  Skin: Skin is warm and dry. No rash noted.  Psychiatric: She has a normal mood and affect. Her behavior is normal. Judgment and thought content normal.   BP 139/83  Pulse 96  Temp(Src) 96.9 F (36.1 C) (Oral)  Ht 5' 3.5" (1.613 m)  Wt 156 lb (70.761 kg)  BMI 27.20 kg/m2        Assessment & Plan:  1. Atrial fibrillation, unspecified - POCT CBC  2. HYPERLIPIDEMIA - POCT CBC - Lipid panel  3. Essential hypertension - POCT CBC - BMP8+EGFR - Hepatic function panel  4. Metabolic syndrome - POCT CBC - POCT glycosylated hemoglobin (Hb A1C)  5. Vitamin D deficiency - Vit D  25 hydroxy (rtn osteoporosis monitoring)  6. A-fib -Pro time adjusted  - POCT INR  7. Allergic rhinitis due to pollen  8. Right-sided low back pain with right-sided sciatica  Meds ordered this encounter  Medications  . penicillin v potassium (VEETID) 500 MG tablet    Sig:   . nicotine (NICODERM CQ - DOSED IN MG/24 HOURS) 14 mg/24hr patch    Sig: Place 1 patch (14 mg total) onto the skin daily.    Dispense:  28 patch    Refill:  3  . fluticasone (FLONASE) 50 MCG/ACT nasal spray    Sig: Place 2 sprays into both nostrils daily.    Dispense:  16 g    Refill:  3  . azelastine (ASTELIN) 0.1 % nasal spray    Sig: Place 1 spray into both nostrils 2 (two) times daily. Use in each nostril as directed    Dispense:  30 mL    Refill:  3   Patient Instructions                        Medicare Annual Wellness Visit  [facility] and the medical providers at [facility] strive to bring you the best medical care.  In doing so we not only want to address your current medical conditions and concerns but also to detect new conditions early and prevent illness, disease and health-related problems.    Medicare offers a yearly Wellness Visit which allows our clinical staff to assess your need for preventative  services including immunizations, lifestyle education, counseling to decrease risk of preventable diseases and screening for fall risk and other medical concerns.    This visit is provided free of charge (no copay) for all Medicare recipients. The clinical pharmacists at [facility] have begun to conduct these Wellness Visits which will also include a thorough review of all your medications.    As you primary medical provider recommend that you make an appointment for your Annual Wellness Visit if you have not done so already this year.  You may set up this appointment before you leave today or you may call back [phone]) and schedule an appointment.  Please make sure when you call  that you mention that you are scheduling your Annual Wellness Visit with the clinical pharmacist so that the appointment may be made for the proper length of time.     Continue current medications. Continue good therapeutic lifestyle changes which include good diet and exercise. Fall precautions discussed with patient. If an FOBT was given today- please return it to our front desk. If you are over 38 years old - you may need Prevnar 57 or the adult Pneumonia vaccine.  Flu Shots will be available at our office starting mid- September. Please call and schedule a FLU CLINIC APPOINTMENT.   Be sure and get back in touch with the neurosurgeon regarding her right back pain. Continue your Coumadin as directed Return to the FOBT We will call you regarding your lab work as soon as those results are available Use nose sprays as directed Try to stop smoking Use of nicotine patches as directed Keep the cat out of the bed     Anticoagulation Dose Instructions as of 03/12/2014     Dorene Grebe Tue Wed Thu Fri Sat   New Dose 7.5 mg 10 mg 7.5 mg 10 mg 10 mg 10 mg 7.5 mg    Description       Continue regular warfarin dose of 2 tablets on mondays, wednesdays and fridays.  1 and 1/2 tablets all other days          Arrie Senate MD

## 2014-03-12 NOTE — Patient Instructions (Addendum)
Medicare Annual Wellness Visit  [facility] and the medical providers at [facility] strive to bring you the best medical care.  In doing so we not only want to address your current medical conditions and concerns but also to detect new conditions early and prevent illness, disease and health-related problems.    Medicare offers a yearly Wellness Visit which allows our clinical staff to assess your need for preventative services including immunizations, lifestyle education, counseling to decrease risk of preventable diseases and screening for fall risk and other medical concerns.    This visit is provided free of charge (no copay) for all Medicare recipients. The clinical pharmacists at [facility] have begun to conduct these Wellness Visits which will also include a thorough review of all your medications.    As you primary medical provider recommend that you make an appointment for your Annual Wellness Visit if you have not done so already this year.  You may set up this appointment before you leave today or you may call back [phone] and schedule an appointment.  Please make sure when you call that you mention that you are scheduling your Annual Wellness Visit with the clinical pharmacist so that the appointment may be made for the proper length of time.     Continue current medications. Continue good therapeutic lifestyle changes which include good diet and exercise. Fall precautions discussed with patient. If an FOBT was given today- please return it to our front desk. If you are over 23 years old - you may need Prevnar 66 or the adult Pneumonia vaccine.  Flu Shots will be available at our office starting mid- September. Please call and schedule a FLU CLINIC APPOINTMENT.   Be sure and get back in touch with the neurosurgeon regarding her right back pain. Continue your Coumadin as directed Return to the FOBT We will call  you regarding your lab work as soon as those results are available Use nose sprays as directed Try to stop smoking Use of nicotine patches as directed Keep the cat out of the bed     Anticoagulation Dose Instructions as of 03/12/2014     Dorene Grebe Tue Wed Thu Fri Sat   New Dose 7.5 mg 10 mg 7.5 mg 10 mg 10 mg 10 mg 7.5 mg    Description       Continue regular warfarin dose of 2 tablets on mondays, wednesdays and fridays.  1 and 1/2 tablets all other days

## 2014-03-13 LAB — BMP8+EGFR
BUN / CREAT RATIO: 16 (ref 11–26)
BUN: 25 mg/dL (ref 8–27)
CHLORIDE: 105 mmol/L (ref 97–108)
CO2: 21 mmol/L (ref 18–29)
Calcium: 8.9 mg/dL (ref 8.7–10.3)
Creatinine, Ser: 1.6 mg/dL — ABNORMAL HIGH (ref 0.57–1.00)
GFR calc Af Amer: 37 mL/min/{1.73_m2} — ABNORMAL LOW (ref >59)
GFR calc non Af Amer: 32 mL/min/{1.73_m2} — ABNORMAL LOW (ref >59)
GLUCOSE: 93 mg/dL (ref 65–99)
POTASSIUM: 4.3 mmol/L (ref 3.5–5.2)
Sodium: 148 mmol/L — ABNORMAL HIGH (ref 134–144)

## 2014-03-13 LAB — LIPID PANEL
CHOLESTEROL TOTAL: 144 mg/dL (ref 100–199)
Chol/HDL Ratio: 3.6 ratio units (ref 0.0–4.4)
HDL: 40 mg/dL (ref >39)
LDL Calculated: 30 mg/dL (ref 0–99)
TRIGLYCERIDES: 369 mg/dL — AB (ref 0–149)
VLDL Cholesterol Cal: 74 mg/dL — ABNORMAL HIGH (ref 5–40)

## 2014-03-13 LAB — VITAMIN D 25 HYDROXY (VIT D DEFICIENCY, FRACTURES): Vit D, 25-Hydroxy: 30.2 ng/mL (ref 30.0–100.0)

## 2014-03-13 LAB — HEPATIC FUNCTION PANEL
ALBUMIN: 3.9 g/dL (ref 3.5–4.8)
ALT: 27 IU/L (ref 0–32)
AST: 23 IU/L (ref 0–40)
Alkaline Phosphatase: 94 IU/L (ref 39–117)
Bilirubin, Direct: 0.1 mg/dL (ref 0.00–0.40)
Total Bilirubin: 0.3 mg/dL (ref 0.0–1.2)
Total Protein: 6 g/dL (ref 6.0–8.5)

## 2014-04-11 ENCOUNTER — Other Ambulatory Visit: Payer: Self-pay | Admitting: *Deleted

## 2014-04-11 MED ORDER — WARFARIN SODIUM 5 MG PO TABS: ORAL_TABLET | ORAL | Status: DC

## 2014-04-11 MED ORDER — ALBUTEROL SULFATE HFA 108 (90 BASE) MCG/ACT IN AERS: INHALATION_SPRAY | RESPIRATORY_TRACT | Status: DC

## 2014-04-11 MED ORDER — AMLODIPINE BESYLATE 5 MG PO TABS: ORAL_TABLET | ORAL | Status: DC

## 2014-04-14 ENCOUNTER — Other Ambulatory Visit: Payer: Self-pay | Admitting: Pharmacist

## 2014-04-14 ENCOUNTER — Ambulatory Visit: Payer: Medicare Other

## 2014-04-14 ENCOUNTER — Ambulatory Visit (INDEPENDENT_AMBULATORY_CARE_PROVIDER_SITE_OTHER): Payer: Medicare Other | Admitting: Pharmacist

## 2014-04-14 DIAGNOSIS — Z23 Encounter for immunization: Secondary | ICD-10-CM

## 2014-04-14 DIAGNOSIS — I4891 Unspecified atrial fibrillation: Secondary | ICD-10-CM

## 2014-04-14 LAB — POCT INR: INR: 2.2

## 2014-04-14 MED ORDER — WARFARIN SODIUM 5 MG PO TABS: ORAL_TABLET | ORAL | Status: DC

## 2014-04-14 NOTE — Patient Instructions (Signed)
Anticoagulation Dose Instructions as of 04/14/2014     Melinda Collins Tue Wed Thu Fri Sat   New Dose 7.5 mg 10 mg 7.5 mg 10 mg 10 mg 10 mg 7.5 mg    Description       Continue regular warfarin dose of 1 and 1/2 tablet on sundays, tuesdays and saturdays.  Take 2 tablets all other days.      INR was 2.2 today

## 2014-05-01 ENCOUNTER — Ambulatory Visit (INDEPENDENT_AMBULATORY_CARE_PROVIDER_SITE_OTHER): Payer: Medicare Other | Admitting: Pharmacist

## 2014-05-01 DIAGNOSIS — I4891 Unspecified atrial fibrillation: Secondary | ICD-10-CM

## 2014-05-01 LAB — POCT INR: INR: 2.3

## 2014-05-01 NOTE — Patient Instructions (Signed)
Anticoagulation Dose Instructions as of 05/01/2014     Melinda Collins Tue Wed Thu Fri Sat   New Dose 7.5 mg 10 mg 7.5 mg 10 mg 10 mg 10 mg 7.5 mg    Description       Continue regular warfarin dose of 1 and 1/2 tablet on sundays, tuesdays and saturdays.  Take 2 tablets all other days.      INR was 2.3 today

## 2014-05-15 ENCOUNTER — Other Ambulatory Visit: Payer: Self-pay | Admitting: Family Medicine

## 2014-05-21 ENCOUNTER — Ambulatory Visit (INDEPENDENT_AMBULATORY_CARE_PROVIDER_SITE_OTHER): Payer: Medicare Other | Admitting: Pharmacist

## 2014-05-21 DIAGNOSIS — I481 Persistent atrial fibrillation: Secondary | ICD-10-CM

## 2014-05-21 DIAGNOSIS — I4819 Other persistent atrial fibrillation: Secondary | ICD-10-CM

## 2014-05-21 LAB — POCT INR: INR: 2.4

## 2014-05-21 NOTE — Patient Instructions (Signed)
Anticoagulation Dose Instructions as of 05/21/2014      Melinda Collins Tue Wed Thu Fri Sat   New Dose 7.5 mg 10 mg 7.5 mg 10 mg 10 mg 10 mg 7.5 mg    Description        Continue regular warfarin dose of 1 and 1/2 tablet on sundays, tuesdays and saturdays.  Take 2 tablets all other days.      INR was 2.4 today

## 2014-06-16 ENCOUNTER — Ambulatory Visit (INDEPENDENT_AMBULATORY_CARE_PROVIDER_SITE_OTHER): Payer: Medicare Other | Admitting: Pharmacist

## 2014-06-16 DIAGNOSIS — I4891 Unspecified atrial fibrillation: Secondary | ICD-10-CM

## 2014-06-16 LAB — POCT INR: INR: 2.2

## 2014-06-16 NOTE — Patient Instructions (Signed)
Anticoagulation Dose Instructions as of 06/16/2014      Dorene Grebe Tue Wed Thu Fri Sat   New Dose 7.5 mg 10 mg 7.5 mg 10 mg 10 mg 10 mg 7.5 mg    Description        Continue regular warfarin dose of 1 and 1/2 tablet on sundays, tuesdays and saturdays.  Take 2 tablets all other days.      INR was 2.2 today

## 2014-06-18 ENCOUNTER — Other Ambulatory Visit: Payer: Self-pay | Admitting: Family Medicine

## 2014-07-07 ENCOUNTER — Telehealth: Payer: Self-pay | Admitting: Family Medicine

## 2014-07-07 MED ORDER — PREDNISONE 10 MG PO TABS: ORAL_TABLET | ORAL | Status: DC

## 2014-07-07 NOTE — Telephone Encounter (Signed)
Okay 1

## 2014-07-07 NOTE — Telephone Encounter (Signed)
Pt aware rx is at [facility]

## 2014-07-07 NOTE — Telephone Encounter (Signed)
[facility] - can we send in a script for prednisone?

## 2014-07-09 ENCOUNTER — Ambulatory Visit: Payer: Medicare Other | Admitting: Family Medicine

## 2014-07-15 ENCOUNTER — Other Ambulatory Visit: Payer: Self-pay | Admitting: Family Medicine

## 2014-07-15 NOTE — Telephone Encounter (Signed)
Patient is scheduled for f/u appointment with Dr. Laurance Flatten 08/07/14.

## 2014-07-25 ENCOUNTER — Other Ambulatory Visit: Payer: Self-pay | Admitting: Family Medicine

## 2014-07-25 NOTE — Telephone Encounter (Signed)
Please review

## 2014-08-01 ENCOUNTER — Ambulatory Visit (INDEPENDENT_AMBULATORY_CARE_PROVIDER_SITE_OTHER): Payer: Medicare Other | Admitting: Pharmacist

## 2014-08-01 ENCOUNTER — Encounter: Payer: Self-pay | Admitting: Pharmacist

## 2014-08-01 VITALS — BP 124/78 | HR 72 | Ht 64.0 in | Wt 165.0 lb

## 2014-08-01 DIAGNOSIS — Z23 Encounter for immunization: Secondary | ICD-10-CM

## 2014-08-01 DIAGNOSIS — Z13820 Encounter for screening for osteoporosis: Secondary | ICD-10-CM

## 2014-08-01 DIAGNOSIS — F17200 Nicotine dependence, unspecified, uncomplicated: Secondary | ICD-10-CM

## 2014-08-01 DIAGNOSIS — I4891 Unspecified atrial fibrillation: Secondary | ICD-10-CM

## 2014-08-01 DIAGNOSIS — Z0000 Encounter for general adult medical examination without abnormal findings: Secondary | ICD-10-CM

## 2014-08-01 DIAGNOSIS — Z720 Tobacco use: Secondary | ICD-10-CM | POA: Diagnosis not present

## 2014-08-01 LAB — POCT INR: INR: 3.2

## 2014-08-01 NOTE — Progress Notes (Signed)
Patient ID: Melinda Collins, female   DOB: 1941-02-10, 74 y.o.   MRN: [id number] Subjective:    Melinda Collins is a 74 y.o. female who presents for Medicare Initial annual wellness visit and to recheck INR / Protime today   Indication: atrial fibrillation Bleeding signs/symptoms: None Thromboembolic signs/symptoms: None  Missed Coumadin doses: None Medication changes: yes - prednisone taken about 2-3 weeks ago for 8 days Dietary changes: no Bacterial/viral infection: no  Preventive Screening-Counseling & Management  Tobacco History  Smoking status  . Current Some Day Smoker -- 0.10 packs/day  . Types: Cigarettes  . Last Attempt to Quit: 04/03/2013  Smokeless tobacco  . Never Used    Comment: using blue cigs      Current Problems (verified) Patient Active Problem List   Diagnosis Date Noted  . Metabolic syndrome [id number]  . History of uterine cancer 12/03/2013  . Chronic low back pain 05/23/2013  . High risk medication use 05/23/2013  . Swelling of limb 05/03/2013  . Hypertension 05/17/2011  . HYPERLIPIDEMIA 02/10/2009  . TOBACCO ABUSE 02/10/2009  . 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 Atrial fibrillation 02/10/2009    Medications Prior to Visit Current Outpatient Prescriptions on File Prior to Visit  Medication Sig Dispense Refill  . amLODipine (NORVASC) 5 MG tablet TAKE ONE TABLET BY MOUTH ONCE DAILY 30 tablet 3  . atorvastatin (LIPITOR) 80 MG tablet TAKE ONE TABLET BY MOUTH DAILY 30 tablet 2  . azelastine (ASTELIN) 0.1 % nasal spray USE ONE SPRAY IN EACH NOSTRIL TWICE DAILY 12 mL 12  . fluticasone (FLONASE) 50 MCG/ACT nasal spray USE 2 SPRAYS IN EACH NOSTRIL ONCE DAILY.  SHAKE GENTLY BEFORE USING. 50 g 12  . Multiple Vitamins-Minerals (CENTRUM SILVER PO) Take 1 tablet by mouth daily.      . nicotine (NICODERM CQ - DOSED IN MG/24 HOURS) 14 mg/24hr patch Place 1 patch (14 mg total) onto the skin daily. 28 patch 3  . NON FORMULARY Oxygen 3L/min     .  oxyCODONE-acetaminophen (PERCOCET) 5-325 MG per tablet Take 2 tablets by mouth every 8 (eight) hours as needed.      Marland Kitchen PARoxetine (PAXIL) 40 MG tablet TAKE ONE TABLET BY MOUTH DAILY 90 tablet 0  . PROAIR HFA 108 (90 BASE) MCG/ACT inhaler INHALE ONE PUFF INTO THE LUNGS FOUR TIMES DAILY AS NEEDED FOR WHEEZING 8.5 g 12  . torsemide (DEMADEX) 20 MG tablet TAKE 1 AND 1/2 TABLETS BY MOUTH ONCE DAILY. (Patient taking differently: 1 tablet daily) 45 tablet 0  . warfarin (COUMADIN) 5 MG tablet TAKE 1 TO 2 TABLETS BY MOUTH AS DIRECTED BY ANTICOAGULATION CLINIC 60 tablet 4   No current facility-administered medications on file prior to visit.    Current Medications (verified) Current Outpatient Prescriptions  Medication Sig Dispense Refill  . amLODipine (NORVASC) 5 MG tablet TAKE ONE TABLET BY MOUTH ONCE DAILY 30 tablet 3  . atorvastatin (LIPITOR) 80 MG tablet TAKE ONE TABLET BY MOUTH DAILY 30 tablet 2  . azelastine (ASTELIN) 0.1 % nasal spray USE ONE SPRAY IN EACH NOSTRIL TWICE DAILY 12 mL 12  . fluticasone (FLONASE) 50 MCG/ACT nasal spray USE 2 SPRAYS IN EACH NOSTRIL ONCE DAILY.  SHAKE GENTLY BEFORE USING. 50 g 12  . Multiple Vitamins-Minerals (CENTRUM SILVER PO) Take 1 tablet by mouth daily.      . nicotine (NICODERM CQ - DOSED IN MG/24 HOURS) 14 mg/24hr patch Place 1 patch (14 mg total) onto the skin daily. 28 patch 3  .  NON FORMULARY Oxygen 3L/min     . oxyCODONE-acetaminophen (PERCOCET) 5-325 MG per tablet Take 2 tablets by mouth every 8 (eight) hours as needed.      Marland Kitchen PARoxetine (PAXIL) 40 MG tablet TAKE ONE TABLET BY MOUTH DAILY 90 tablet 0  . PROAIR HFA 108 (90 BASE) MCG/ACT inhaler INHALE ONE PUFF INTO THE LUNGS FOUR TIMES DAILY AS NEEDED FOR WHEEZING 8.5 g 12  . torsemide (DEMADEX) 20 MG tablet TAKE 1 AND 1/2 TABLETS BY MOUTH ONCE DAILY. (Patient taking differently: 1 tablet daily) 45 tablet 0  . warfarin (COUMADIN) 5 MG tablet TAKE 1 TO 2 TABLETS BY MOUTH AS DIRECTED BY ANTICOAGULATION CLINIC  60 tablet 4   No current facility-administered medications for this visit.     Allergies (verified) Niaspan   PAST HISTORY  Family History Family History  Problem Relation Age of Onset  . Coronary artery disease      fhx  . Heart disease Mother   . Stroke Mother   . Diabetes Mother   . Heart attack Father   . Kidney disease Father   . Cancer Father     prostate  . Cancer Brother   . Heart attack Brother   . Cancer Brother     prostate  . Heart disease Brother     CHF  . COPD Brother     Social History History  Substance Use Topics  . Smoking status: Current Some Day Smoker -- 0.10 packs/day    Types: Cigarettes    Last Attempt to Quit: 04/03/2013  . Smokeless tobacco: Never Used     Comment: using blue cigs  . Alcohol Use: No     Are there smokers in your home (other than you)? No  Risk Factors Current exercise habits: Exercise is limited by orthopedic condition(s): spinal condition - patient is waiting to get clearance from neurosurgeon to have back surgery..  Dietary issues discussed: none   Cardiac risk factors: advanced age (older than 8 for men, 20 for women), family history of premature cardiovascular disease, hypertension and smoking/ tobacco exposure.  Depression Screen (Note: if answer to either of the following is "Yes", a more complete depression screening is indicated)   Over the past 2 weeks, have you felt down, depressed or hopeless? Yes  Over the past 2 weeks, have you felt little interest or pleasure in doing things? Yes  Have you lost interest or pleasure in daily life? No  Do you often feel hopeless? No  Do you cry easily over simple problems? No  Activities of Daily Living In your present state of health, do you have any difficulty performing the following activities?:  Driving? no Managing money?  no Feeding yourself? No Getting from bed to chair? No Climbing a flight of stairs? No Preparing food and eating?: No Bathing or  showering? No - uses shower chair Getting dressed: No Getting to the toilet? No Using the toilet:No Moving around from place to place: No In the past year have you fallen or had a near fall?:No   Are you sexually active?  No  Do you have more than one partner?  No  Hearing Difficulties: Yes - has hearing aids but does not wear.  Do you often ask people to speak up or repeat themselves? Yes Do you experience ringing or noises in your ears? No Do you have difficulty understanding soft or whispered voices? Yes   Do you feel that you have a problem with memory? No  Do you often misplace items? Yes  Do you feel safe at home?  Yes  Cognitive Testing  Alert? Yes  Normal Appearance?Yes  Oriented to person? Yes  Place? Yes   Time? Yes  Recall of three objects?  Yes  Can perform simple calculations? Yes  Displays appropriate judgment?Yes  Can read the correct time from a watch face?Yes   Advanced Directives have been discussed with the patient? Yes  List the Names of Other Physician/Practitioners you currently use: 1.  Dr Glenna Fellows - neurosurgeon - [place], [place] 2.  Dr Jon Billings - Cardiologist - [facility] 3.  Dr Raeanne Gathers - audiologist  4.  Dr Hassell Done, DO - eye  Indicate any recent Medical Services you may have received from other than [facility] providers in the past year (date may be approximate).  Immunization History  Administered Date(s) Administered  . Influenza,inj,Quad PF,36+ Mos 04/09/2013, 04/14/2014  . Pneumococcal Conjugate-13 07/22/2013  . Tdap 08/01/2014  . Zoster 08/05/2013    Screening Tests Health Maintenance  Topic Date Due  . COLON CANCER SCREENING ANNUAL FOBT  07/04/1992  . DEXA SCAN  07/05/2007  . TETANUS/TDAP  07/18/2013  . MAMMOGRAM  03/13/2015 (Originally 10/12/2008)  . INFLUENZA VACCINE  02/16/2015  . COLONOSCOPY  07/18/2020  . PNEUMOCOCCAL POLYSACCHARIDE VACCINE AGE 59 AND OVER  Completed  . ZOSTAVAX  Completed    All answers were reviewed with  the patient and necessary referrals were made:  Cherre Robins, [facility]   08/01/2014   History reviewed: allergies, current medications, past family history, past medical history, past social history, past surgical history and problem list    Objective:     Body mass index is 28.31 kg/(m^2). BP 124/78 mmHg  Pulse 72  Ht 5\' 4"  (1.626 m)  Wt 165 lb (74.844 kg)  BMI 28.31 kg/m2    INR was 3.2 today Assessment:     Subsequent Annual [facility] Visit Supratherapeutic Anticoagulation Tobacco Abuse - patient is smoking 1 or 2 cigs per week.     Plan:     During the course of the visit the patient was educated and counseled about appropriate screening and preventive services including:    Pneumococcal vaccine - UTD / completed 2015  Influenza vaccine - UTD  Td vaccine - due, given in office today  Screening mammography - patient declined  Bone densitometry screening - referral sent today  Colorectal cancer screening - due FOBT  Glaucoma screening - UTD per patient, will request records from Dr. Hassell Done  Smoking cessation counseling - discussed today.  Patient is wearing nicotine patches and had decreased smoking considerably.  Discussed triggers and trigger avoidance.    Advanced directives: has NO advanced directive - Information packet given today in office  Anticoagulation Dose Instructions as of 08/01/2014      Dorene Grebe Tue Wed Thu Fri Sat   New Dose 7.5 mg 10 mg 7.5 mg 10 mg 10 mg 10 mg 7.5 mg    Description        No warfarin today - Friday, January 15th, then continue regular warfarin dose of 1 and 1/2 tablet on sundays, tuesdays and saturdays.  Take 2 tablets all other days.     Recheck INR in 2 weeks.  Patient Instructions (the written plan) was given to the patient.  Medicare Attestation I have personally reviewed: The patient's medical and social history Their use of alcohol, tobacco or illicit drugs Their current medications and supplements The  patient's functional ability including  ADLs,fall risks, home safety risks, cognitive, and hearing and visual impairment Diet and physical activities Evidence for depression or mood disorders  The patient's weight, height, BMI, and BP/HR have been recorded in the chart.  I have made referrals, counseling, and provided education to the patient based on review of the above and I have provided the patient with a written personalized care plan for preventive services.     Cherre Robins, PharmD, CPP  [place], [place], [facility]   08/01/2014

## 2014-08-01 NOTE — Patient Instructions (Addendum)
Anticoagulation Dose Instructions as of 08/01/2014      Melinda Collins Tue Wed Thu Fri Sat   New Dose 7.5 mg 10 mg 7.5 mg 10 mg 10 mg 10 mg 7.5 mg    Description        No warfarin today - Friday, January 15th, then continue regular warfarin dose of 1 and 1/2 tablet on sundays, tuesdays and saturdays.  Take 2 tablets all other days.      INR was 3.2 today  Health Maintenance Summary   COLON CANCER SCREENING ANNUAL FOBT Overdue 07/04/1992    MAMMOGRAM Overdue 10/12/2008    TETANUS/TDAP Next Due 08/01/2024 Given today 08/01/2014   INFLUENZA VACCINE Next Due 02/15/2014 Last given 05/08/2014   Pneumonia Vaccinie Completed 09/01/2013    Zostavax / Shingles vaccine  Completed 08/05/2013    Bone Denisty / DEXA Overdue  Referral sent today   COLONOSCOPY Next Due 07/18/2020 Last done 2012       Preventive Care for Adults A healthy lifestyle and preventive care can promote health and wellness. Preventive health guidelines for women include the following key practices.  A routine yearly physical is a good way to check with your health care provider about your health and preventive screening. It is a chance to share any concerns and updates on your health and to receive a thorough exam.  Visit your dentist for a routine exam and preventive care every 6 months. Brush your teeth twice a day and floss once a day. Good oral hygiene prevents tooth decay and gum disease.  The frequency of eye exams is based on your age, health, family medical history, use of contact lenses, and other factors. Follow your health care provider's recommendations for frequency of eye exams.  Eat a healthy diet. Foods like vegetables, fruits, whole grains, low-fat dairy products, and lean protein foods contain the nutrients you need without too many calories. Decrease your intake of foods high in solid fats, added sugars, and salt. Eat the right amount of calories for you.Get information about a  proper diet from your health care provider, if necessary.  Regular physical exercise is one of the most important things you can do for your health. Most adults should get at least 150 minutes of moderate-intensity exercise (any activity that increases your heart rate and causes you to sweat) each week. In addition, most adults need muscle-strengthening exercises on 2 or more days a week.  Maintain a healthy weight. The body mass index (BMI) is a screening tool to identify possible weight problems. It provides an estimate of body fat based on height and weight. Your health care provider can find your BMI and can help you achieve or maintain a healthy weight.For adults 20 years and older:  A BMI below 18.5 is considered underweight.  A BMI of 18.5 to 24.9 is normal.  A BMI of 25 to 29.9 is considered overweight.  A BMI of 30 and above is considered obese.  Maintain normal blood lipids and cholesterol levels by exercising and minimizing your intake of saturated fat. Eat a balanced diet with plenty of fruit and vegetables. Blood tests for lipids and cholesterol should begin at age 12 and be repeated every 5 years. If your lipid or cholesterol levels are high, you are over 50, or you are at high risk for heart disease, you may need your cholesterol levels checked more frequently.Ongoing high lipid and cholesterol levels should be treated with medicines if diet and exercise are not working.  If you smoke, find out from your health care provider how to quit. If you do not use tobacco, do not start.  Lung cancer screening is recommended for adults aged 34-80 years who are at high risk for developing lung cancer because of a history of smoking. A yearly low-dose CT scan of the lungs is recommended for people who have at least a 30-pack-year history of smoking and are a current smoker or have quit within the past 15 years. A pack year of smoking is smoking an average of 1 pack of cigarettes a day for 1  year (for example: 1 pack a day for 30 years or 2 packs a day for 15 years). Yearly screening should continue until the smoker has stopped smoking for at least 15 years. Yearly screening should be stopped for people who develop a health problem that would prevent them from having lung cancer treatment.  If you are pregnant, do not drink alcohol. If you are breastfeeding, be very cautious about drinking alcohol. If you are not pregnant and choose to drink alcohol, do not have more than 1 drink per day. One drink is considered to be 12 ounces (355 mL) of beer, 5 ounces (148 mL) of wine, or 1.5 ounces (44 mL) of liquor.  Avoid use of street drugs. Do not share needles with anyone. Ask for help if you need support or instructions about stopping the use of drugs.  High blood pressure causes heart disease and increases the risk of stroke. Your blood pressure should be checked at least every 1 to 2 years. Ongoing high blood pressure should be treated with medicines if weight loss and exercise do not work.  If you are 16-36 years old, ask your health care provider if you should take aspirin to prevent strokes.  Diabetes screening involves taking a blood sample to check your fasting blood sugar level. This should be done once every 3 years, after age 94, if you are within normal weight and without risk factors for diabetes. Testing should be considered at a younger age or be carried out more frequently if you are overweight and have at least 1 risk factor for diabetes.  Breast cancer screening is essential preventive care for women. You should practice "breast self-awareness." This means understanding the normal appearance and feel of your breasts and may include breast self-examination. Any changes detected, no matter how small, should be reported to a health care provider. Women in their 46s and 30s should have a clinical breast exam (CBE) by a health care provider as part of a regular health exam every 1 to 3  years. After age 29, women should have a CBE every year. Starting at age 53, women should consider having a mammogram (breast X-ray test) every year. Women who have a family history of breast cancer should talk to their health care provider about genetic screening. Women at a high risk of breast cancer should talk to their health care providers about having an MRI and a mammogram every year.  Breast cancer gene (BRCA)-related cancer risk assessment is recommended for women who have family members with BRCA-related cancers. BRCA-related cancers include breast, ovarian, tubal, and peritoneal cancers. Having family members with these cancers may be associated with an increased risk for harmful changes (mutations) in the breast cancer genes BRCA1 and BRCA2. Results of the assessment will determine the need for genetic counseling and BRCA1 and BRCA2 testing.  Routine pelvic exams to screen for cancer are no longer recommended for  nonpregnant women who are considered low risk for cancer of the pelvic organs (ovaries, uterus, and vagina) and who do not have symptoms. Ask your health care provider if a screening pelvic exam is right for you.  If you have had past treatment for cervical cancer or a condition that could lead to cancer, you need Pap tests and screening for cancer for at least 20 years after your treatment. If Pap tests have been discontinued, your risk factors (such as having a new sexual partner) need to be reassessed to determine if screening should be resumed. Some women have medical problems that increase the chance of getting cervical cancer. In these cases, your health care provider may recommend more frequent screening and Pap tests.  The HPV test is an additional test that may be used for cervical cancer screening. The HPV test looks for the virus that can cause the cell changes on the cervix. The cells collected during the Pap test can be tested for HPV. The HPV test could be used to screen  women aged 41 years and older, and should be used in women of any age who have unclear Pap test results. After the age of 34, women should have HPV testing at the same frequency as a Pap test.  Colorectal cancer can be detected and often prevented. Most routine colorectal cancer screening begins at the age of 36 years and continues through age 30 years. However, your health care provider may recommend screening at an earlier age if you have risk factors for colon cancer. On a yearly basis, your health care provider may provide home test kits to check for hidden blood in the stool. Use of a small camera at the end of a tube, to directly examine the colon (sigmoidoscopy or colonoscopy), can detect the earliest forms of colorectal cancer. Talk to your health care provider about this at age 29, when routine screening begins. Direct exam of the colon should be repeated every 5-10 years through age 79 years, unless early forms of pre-cancerous polyps or small growths are found.  People who are at an increased risk for hepatitis B should be screened for this virus. You are considered at high risk for hepatitis B if:  You were born in a country where hepatitis B occurs often. Talk with your health care provider about which countries are considered high risk.  Your parents were born in a high-risk country and you have not received a shot to protect against hepatitis B (hepatitis B vaccine).  You have HIV or AIDS.  You use needles to inject street drugs.  You live with, or have sex with, someone who has hepatitis B.  You get hemodialysis treatment.  You take certain medicines for conditions like cancer, organ transplantation, and autoimmune conditions.  Hepatitis C blood testing is recommended for all people born from [birth year] through 1965 and any individual with known risks for hepatitis C.  Practice safe sex. Use condoms and avoid high-risk sexual practices to reduce the spread of sexually transmitted  infections (STIs). STIs include gonorrhea, chlamydia, syphilis, trichomonas, herpes, HPV, and human immunodeficiency virus (HIV). Herpes, HIV, and HPV are viral illnesses that have no cure. They can result in disability, cancer, and death.  You should be screened for sexually transmitted illnesses (STIs) including gonorrhea and chlamydia if:  You are sexually active and are younger than 24 years.  You are older than 24 years and your health care provider tells you that you are at risk for this  type of infection.  Your sexual activity has changed since you were last screened and you are at an increased risk for chlamydia or gonorrhea. Ask your health care provider if you are at risk.  If you are at risk of being infected with HIV, it is recommended that you take a prescription medicine daily to prevent HIV infection. This is called preexposure prophylaxis (PrEP). You are considered at risk if:  You are a heterosexual woman, are sexually active, and are at increased risk for HIV infection.  You take drugs by injection.  You are sexually active with a partner who has HIV.  Talk with your health care provider about whether you are at high risk of being infected with HIV. If you choose to begin PrEP, you should first be tested for HIV. You should then be tested every 3 months for as long as you are taking PrEP.  Osteoporosis is a disease in which the bones lose minerals and strength with aging. This can result in serious bone fractures or breaks. The risk of osteoporosis can be identified using a bone density scan. Women ages 75 years and over and women at risk for fractures or osteoporosis should discuss screening with their health care providers. Ask your health care provider whether you should take a calcium supplement or vitamin D to reduce the rate of osteoporosis.  Menopause can be associated with physical symptoms and risks. Hormone replacement therapy is available to decrease symptoms and  risks. You should talk to your health care provider about whether hormone replacement therapy is right for you.  Use sunscreen. Apply sunscreen liberally and repeatedly throughout the day. You should seek shade when your shadow is shorter than you. Protect yourself by wearing long sleeves, pants, a wide-brimmed hat, and sunglasses year round, whenever you are outdoors.  Once a month, do a whole body skin exam, using a mirror to look at the skin on your back. Tell your health care provider of new moles, moles that have irregular borders, moles that are larger than a pencil eraser, or moles that have changed in shape or color.  Stay current with required vaccines (immunizations).  Influenza vaccine. All adults should be immunized every year.  Tetanus, diphtheria, and acellular pertussis (Td, Tdap) vaccine. Pregnant women should receive 1 dose of Tdap vaccine during each pregnancy. The dose should be obtained regardless of the length of time since the last dose. Immunization is preferred during the 27th-36th week of gestation. An adult who has not previously received Tdap or who does not know her vaccine status should receive 1 dose of Tdap. This initial dose should be followed by tetanus and diphtheria toxoids (Td) booster doses every 10 years. Adults with an unknown or incomplete history of completing a 3-dose immunization series with Td-containing vaccines should begin or complete a primary immunization series including a Tdap dose. Adults should receive a Td booster every 10 years.  Varicella vaccine. An adult without evidence of immunity to varicella should receive 2 doses or a second dose if she has previously received 1 dose. Pregnant females who do not have evidence of immunity should receive the first dose after pregnancy. This first dose should be obtained before leaving the health care facility. The second dose should be obtained 4-8 weeks after the first dose.  Human papillomavirus (HPV)  vaccine. Females aged 13-26 years who have not received the vaccine previously should obtain the 3-dose series. The vaccine is not recommended for use in pregnant females. However, pregnancy  testing is not needed before receiving a dose. If a female is found to be pregnant after receiving a dose, no treatment is needed. In that case, the remaining doses should be delayed until after the pregnancy. Immunization is recommended for any person with an immunocompromised condition through the age of 26 years if she did not get any or all doses earlier. During the 3-dose series, the second dose should be obtained 4-8 weeks after the first dose. The third dose should be obtained 24 weeks after the first dose and 16 weeks after the second dose.  Zoster vaccine. One dose is recommended for adults aged 1 years or older unless certain conditions are present.  Measles, mumps, and rubella (MMR) vaccine. Adults born before [birth year] generally are considered immune to measles and mumps. Adults born in [birth year] or later should have 1 or more doses of MMR vaccine unless there is a contraindication to the vaccine or there is laboratory evidence of immunity to each of the three diseases. A routine second dose of MMR vaccine should be obtained at least 28 days after the first dose for students attending postsecondary schools, health care workers, or international travelers. People who received inactivated measles vaccine or an unknown type of measles vaccine during 1963-1967 should receive 2 doses of MMR vaccine. People who received inactivated mumps vaccine or an unknown type of mumps vaccine before 1979 and are at high risk for mumps infection should consider immunization with 2 doses of MMR vaccine. For females of childbearing age, rubella immunity should be determined. If there is no evidence of immunity, females who are not pregnant should be vaccinated. If there is no evidence of immunity, females who are pregnant should delay  immunization until after pregnancy. Unvaccinated health care workers born before 29 who lack laboratory evidence of measles, mumps, or rubella immunity or laboratory confirmation of disease should consider measles and mumps immunization with 2 doses of MMR vaccine or rubella immunization with 1 dose of MMR vaccine.  Pneumococcal 13-valent conjugate (PCV13) vaccine. When indicated, a person who is uncertain of her immunization history and has no record of immunization should receive the PCV13 vaccine. An adult aged 15 years or older who has certain medical conditions and has not been previously immunized should receive 1 dose of PCV13 vaccine. This PCV13 should be followed with a dose of pneumococcal polysaccharide (PPSV23) vaccine. The PPSV23 vaccine dose should be obtained at least 8 weeks after the dose of PCV13 vaccine. An adult aged 27 years or older who has certain medical conditions and previously received 1 or more doses of PPSV23 vaccine should receive 1 dose of PCV13. The PCV13 vaccine dose should be obtained 1 or more years after the last PPSV23 vaccine dose.  Pneumococcal polysaccharide (PPSV23) vaccine. When PCV13 is also indicated, PCV13 should be obtained first. All adults aged 38 years and older should be immunized. An adult younger than age 47 years who has certain medical conditions should be immunized. Any person who resides in a nursing home or long-term care facility should be immunized. An adult smoker should be immunized. People with an immunocompromised condition and certain other conditions should receive both PCV13 and PPSV23 vaccines. People with human immunodeficiency virus (HIV) infection should be immunized as soon as possible after diagnosis. Immunization during chemotherapy or radiation therapy should be avoided. Routine use of PPSV23 vaccine is not recommended for American Indians, [address], or people younger than 65 years unless there are medical conditions that require  PPSV23 vaccine.  When indicated, people who have unknown immunization and have no record of immunization should receive PPSV23 vaccine. One-time revaccination 5 years after the first dose of PPSV23 is recommended for people aged 19-64 years who have chronic kidney failure, nephrotic syndrome, asplenia, or immunocompromised conditions. People who received 1-2 doses of PPSV23 before age 29 years should receive another dose of PPSV23 vaccine at age 42 years or later if at least 5 years have passed since the previous dose. Doses of PPSV23 are not needed for people immunized with PPSV23 at or after age 46 years.  Meningococcal vaccine. Adults with asplenia or persistent complement component deficiencies should receive 2 doses of quadrivalent meningococcal conjugate (MenACWY-D) vaccine. The doses should be obtained at least 2 months apart. Microbiologists working with certain meningococcal bacteria, [employer], people at risk during an outbreak, and people who travel to or live in countries with a high rate of meningitis should be immunized. A first-year college student up through age 45 years who is living in a residence hall should receive a dose if she did not receive a dose on or after her 16th birthday. Adults who have certain high-risk conditions should receive one or more doses of vaccine.  Hepatitis A vaccine. Adults who wish to be protected from this disease, have certain high-risk conditions, work with hepatitis A-infected animals, work in hepatitis A research labs, or travel to or work in countries with a high rate of hepatitis A should be immunized. Adults who were previously unvaccinated and who anticipate close contact with an international adoptee during the first 60 days after arrival in the [place] from a country with a high rate of hepatitis A should be immunized.  Hepatitis B vaccine. Adults who wish to be protected from this disease, have certain high-risk conditions, may be exposed  to blood or other infectious body fluids, are household contacts or sex partners of hepatitis B positive people, are clients or workers in certain care facilities, or travel to or work in countries with a high rate of hepatitis B should be immunized.  Haemophilus influenzae type b (Hib) vaccine. A previously unvaccinated person with asplenia or sickle cell disease or having a scheduled splenectomy should receive 1 dose of Hib vaccine. Regardless of previous immunization, a recipient of a hematopoietic stem cell transplant should receive a 3-dose series 6-12 months after her successful transplant. Hib vaccine is not recommended for adults with HIV infection. Preventive Services / Frequency Ages 69 years and over  Blood pressure check.** / Every 1 to 2 years.  Lipid and cholesterol check.** / Every 5 years beginning at age 37 years.  Lung cancer screening. / Every year if you are aged 33-80 years and have a 30-pack-year history of smoking and currently smoke or have quit within the past 15 years. Yearly screening is stopped once you have quit smoking for at least 15 years or develop a health problem that would prevent you from having lung cancer treatment.  Clinical breast exam.** / Every year after age 83 years.  BRCA-related cancer risk assessment.** / For women who have family members with a BRCA-related cancer (breast, ovarian, tubal, or peritoneal cancers).  Mammogram.** / Every year beginning at age 65 years and continuing for as long as you are in good health. Consult with your health care provider.  Pap test.** / Every 3 years starting at age 8 years through age 30 or 36 years with 3 consecutive normal Pap tests. Testing can be stopped between 65 and 70  years with 3 consecutive normal Pap tests and no abnormal Pap or HPV tests in the past 10 years.  HPV screening.** / Every 3 years from ages 70 years through ages 98 or 63 years with a history of 3 consecutive normal Pap tests. Testing can  be stopped between 65 and 70 years with 3 consecutive normal Pap tests and no abnormal Pap or HPV tests in the past 10 years.  Fecal occult blood test (FOBT) of stool. / Every year beginning at age 46 years and continuing until age 24 years. You may not need to do this test if you get a colonoscopy every 10 years.  Flexible sigmoidoscopy or colonoscopy.** / Every 5 years for a flexible sigmoidoscopy or every 10 years for a colonoscopy beginning at age 96 years and continuing until age 25 years.  Hepatitis C blood test.** / For all people born from [birth year] through 1964 and any individual with known risks for hepatitis C.  Osteoporosis screening.** / A one-time screening for women ages 72 years and over and women at risk for fractures or osteoporosis.  Skin self-exam. / Monthly.  Influenza vaccine. / Every year.  Tetanus, diphtheria, and acellular pertussis (Tdap/Td) vaccine.** / 1 dose of Td every 10 years.  Varicella vaccine.** / Consult your health care provider.  Zoster vaccine.** / 1 dose for adults aged 45 years or older.  Pneumococcal 13-valent conjugate (PCV13) vaccine.** / Consult your health care provider.  Pneumococcal polysaccharide (PPSV23) vaccine.** / 1 dose for all adults aged 47 years and older.  Meningococcal vaccine.** / Consult your health care provider.  Hepatitis A vaccine.** / Consult your health care provider.  Hepatitis B vaccine.** / Consult your health care provider.  Haemophilus influenzae type b (Hib) vaccine.** / Consult your health care provider. ** Family history and personal history of risk and conditions may change your health care provider's recommendations. Document Released: 08/30/2001 Document Revised: 11/18/2013 Document Reviewed: 11/29/2010 [facility] Patient Information 2015 [place], [place]. This information is not intended to replace advice given to you by your health care provider. Make sure you discuss any questions you have with your health care  provider.

## 2014-08-04 DIAGNOSIS — J449 Chronic obstructive pulmonary disease, unspecified: Secondary | ICD-10-CM | POA: Diagnosis not present

## 2014-08-07 ENCOUNTER — Encounter: Payer: Self-pay | Admitting: Family Medicine

## 2014-08-07 ENCOUNTER — Ambulatory Visit (INDEPENDENT_AMBULATORY_CARE_PROVIDER_SITE_OTHER): Payer: Medicare Other | Admitting: Family Medicine

## 2014-08-07 ENCOUNTER — Other Ambulatory Visit: Payer: Self-pay | Admitting: Family Medicine

## 2014-08-07 ENCOUNTER — Ambulatory Visit (INDEPENDENT_AMBULATORY_CARE_PROVIDER_SITE_OTHER): Payer: Medicare Other

## 2014-08-07 VITALS — BP 120/79 | HR 95 | Temp 97.8°F | Ht 64.0 in | Wt 160.0 lb

## 2014-08-07 DIAGNOSIS — J40 Bronchitis, not specified as acute or chronic: Secondary | ICD-10-CM

## 2014-08-07 DIAGNOSIS — J209 Acute bronchitis, unspecified: Secondary | ICD-10-CM

## 2014-08-07 DIAGNOSIS — E559 Vitamin D deficiency, unspecified: Secondary | ICD-10-CM | POA: Diagnosis not present

## 2014-08-07 DIAGNOSIS — R05 Cough: Secondary | ICD-10-CM

## 2014-08-07 DIAGNOSIS — I10 Essential (primary) hypertension: Secondary | ICD-10-CM | POA: Diagnosis not present

## 2014-08-07 DIAGNOSIS — E8881 Metabolic syndrome: Secondary | ICD-10-CM

## 2014-08-07 DIAGNOSIS — R059 Cough, unspecified: Secondary | ICD-10-CM

## 2014-08-07 DIAGNOSIS — I481 Persistent atrial fibrillation: Secondary | ICD-10-CM

## 2014-08-07 DIAGNOSIS — Z8542 Personal history of malignant neoplasm of other parts of uterus: Secondary | ICD-10-CM

## 2014-08-07 DIAGNOSIS — I4819 Other persistent atrial fibrillation: Secondary | ICD-10-CM

## 2014-08-07 LAB — POCT CBC
GRANULOCYTE PERCENT: 71 % (ref 37–80)
HEMATOCRIT: 45.1 % (ref 37.7–47.9)
HEMOGLOBIN: 14.7 g/dL (ref 12.2–16.2)
Lymph, poc: 1.7 (ref 0.6–3.4)
MCH, POC: 29.2 pg (ref 27–31.2)
MCHC: 32.5 g/dL (ref 31.8–35.4)
MCV: 89.8 fL (ref 80–97)
MPV: 7.4 fL (ref 0–99.8)
POC GRANULOCYTE: 5.3 (ref 2–6.9)
POC LYMPH PERCENT: 23.4 %L (ref 10–50)
Platelet Count, POC: 235 10*3/uL (ref 142–424)
RBC: 5 M/uL (ref 4.04–5.48)
RDW, POC: 16.5 %
WBC: 7.4 10*3/uL (ref 4.6–10.2)

## 2014-08-07 MED ORDER — CEFDINIR 300 MG PO CAPS: 300.0000 mg | ORAL_CAPSULE | Freq: Two times a day (BID) | ORAL | Status: DC

## 2014-08-07 MED ORDER — METHYLPREDNISOLONE ACETATE 80 MG/ML IJ SUSP
60.0000 mg | Freq: Once | INTRAMUSCULAR | Status: AC
  Administered 2014-08-07: 60 mg via INTRAMUSCULAR

## 2014-08-07 MED ORDER — PREDNISONE 10 MG PO TABS: ORAL_TABLET | ORAL | Status: DC

## 2014-08-07 NOTE — Patient Instructions (Addendum)
Medicare Annual Wellness Visit  [facility] and the medical providers at [facility] strive to bring you the best medical care.  In doing so we not only want to address your current medical conditions and concerns but also to detect new conditions early and prevent illness, disease and health-related problems.    Medicare offers a yearly Wellness Visit which allows our clinical staff to assess your need for preventative services including immunizations, lifestyle education, counseling to decrease risk of preventable diseases and screening for fall risk and other medical concerns.    This visit is provided free of charge (no copay) for all Medicare recipients. The clinical pharmacists at [facility] have begun to conduct these Wellness Visits which will also include a thorough review of all your medications.    As you primary medical provider recommend that you make an appointment for your Annual Wellness Visit if you have not done so already this year.  You may set up this appointment before you leave today or you may call back [phone] and schedule an appointment.  Please make sure when you call that you mention that you are scheduling your Annual Wellness Visit with the clinical pharmacist so that the appointment may be made for the proper length of time.     Continue current medications. Continue good therapeutic lifestyle changes which include good diet and exercise. Fall precautions discussed with patient. If an FOBT was given today- please return it to our front desk. If you are over 25 years old - you may need Prevnar 2 or the adult Pneumonia vaccine.  Flu Shots will be available at our office starting mid- September. Please call and schedule a FLU CLINIC APPOINTMENT.   Drink plenty of fluids Take Mucinex maximum strength 1 twice daily with a large glass of water for cough and congestion Reduce Coumadin to 1 tablet  daily while taking the antibiotic and the prednisone  Take antibiotic as directed Take prednisone as directed Use Symbicort 160/4.5   2 puffs twice a day and rinse mouth after using Use albuterol inhaler as a rescue inhaler as needed

## 2014-08-07 NOTE — Progress Notes (Signed)
Subjective:    Patient ID: Melinda Collins, female    DOB: Mar 12, 1941, 74 y.o.   MRN: [id number]  HPI Pt here for follow up and management of chronic medical problems which include hyperlipidemia and atrial fibrillation. She is taking medications regularly. The patient has had a cold with cough and congestion for over a month. She's been of breath with this. She is not coughing up a lot of sputum. She is due to return an FOBT and will return for fasting lab work.         Patient Active Problem List   Diagnosis Date Noted  . Metabolic syndrome 08/17/4386  . History of uterine cancer 12/03/2013  . Chronic low back pain 05/23/2013  . High risk medication use 05/23/2013  . Swelling of limb 05/03/2013  . Hypertension 05/17/2011  . HYPERLIPIDEMIA 02/10/2009  . TOBACCO ABUSE 02/10/2009  . 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 Atrial fibrillation 02/10/2009   Outpatient Encounter Prescriptions as of 08/07/2014  Medication Sig  . amLODipine (NORVASC) 5 MG tablet TAKE ONE TABLET BY MOUTH ONCE DAILY  . atorvastatin (LIPITOR) 80 MG tablet TAKE ONE TABLET BY MOUTH DAILY  . azelastine (ASTELIN) 0.1 % nasal spray USE ONE SPRAY IN EACH NOSTRIL TWICE DAILY  . fluticasone (FLONASE) 50 MCG/ACT nasal spray USE 2 SPRAYS IN EACH NOSTRIL ONCE DAILY.  SHAKE GENTLY BEFORE USING.  . Multiple Vitamins-Minerals (CENTRUM SILVER PO) Take 1 tablet by mouth daily.    . nicotine (NICODERM CQ - DOSED IN MG/24 HOURS) 14 mg/24hr patch Place 1 patch (14 mg total) onto the skin daily.  . NON FORMULARY Oxygen 3L/min   . oxyCODONE-acetaminophen (PERCOCET) 5-325 MG per tablet Take 2 tablets by mouth every 8 (eight) hours as needed.    Marland Kitchen PARoxetine (PAXIL) 40 MG tablet TAKE ONE TABLET BY MOUTH DAILY  . PROAIR HFA 108 (90 BASE) MCG/ACT inhaler INHALE ONE PUFF INTO THE LUNGS FOUR TIMES DAILY AS NEEDED FOR WHEEZING  . torsemide (DEMADEX) 20 MG tablet TAKE 1 AND 1/2 TABLETS BY MOUTH ONCE DAILY. (Patient taking  differently: 1 tablet daily)  . warfarin (COUMADIN) 5 MG tablet TAKE 1 TO 2 TABLETS BY MOUTH AS DIRECTED BY ANTICOAGULATION CLINIC    Review of Systems  Constitutional: Negative.   HENT: Positive for congestion.   Eyes: Negative.   Respiratory: Positive for cough (non-productive) and shortness of breath.   Cardiovascular: Negative.   Gastrointestinal: Negative.   Endocrine: Negative.   Genitourinary: Negative.   Musculoskeletal: Negative.   Skin: Negative.   Allergic/Immunologic: Negative.   Neurological: Negative.   Hematological: Negative.   Psychiatric/Behavioral: Negative.        Objective:   Physical Exam  Constitutional: She is oriented to person, place, and time. She appears well-developed and well-nourished. She appears distressed.  Alert and somewhat anxious regarding her breathing and coughing  HENT:  Head: Normocephalic and atraumatic.  Right Ear: External ear normal.  Left Ear: External ear normal.  Nose: Nose normal.  Mouth/Throat: Oropharynx is clear and moist.  Eyes: Conjunctivae and EOM are normal. Pupils are equal, round, and reactive to light. Right eye exhibits no discharge. Left eye exhibits no discharge. No scleral icterus.  Neck: Normal range of motion. Neck supple. No thyromegaly present.  No anterior cervical nodes  Cardiovascular: Normal rate, regular rhythm and normal heart sounds.   No murmur heard. The heart was fairly regular today at 84/m.  Pulmonary/Chest: Effort normal. No respiratory distress. She has wheezes. She has no rales.  She exhibits no tenderness.  The patient had slightly diminished breath sounds bilaterally and a tight slightly congested cough.  Abdominal: Soft. Bowel sounds are normal. She exhibits distension. She exhibits no mass. There is no tenderness. There is no rebound and no guarding.  Musculoskeletal: Normal range of motion. She exhibits no edema.  Lymphadenopathy:    She has no cervical adenopathy.  Neurological: She is  alert and oriented to person, place, and time. She has normal reflexes. No cranial nerve deficit.  Skin: Skin is warm and dry. No rash noted.  Psychiatric: She has a normal mood and affect. Her behavior is normal. Judgment and thought content normal.  Nursing note and vitals reviewed.  BP 120/79 mmHg  Pulse 95  Temp(Src) 97.8 F (36.6 C) (Oral)  Ht '5\' 4"'  (1.626 m)  Wt 160 lb (72.576 kg)  BMI 27.45 kg/m2  [facility] reading (PRIMARY) by  Dr. Brunilda Payor x-ray-no active disease                                      Assessment & Plan:  1. Persistent atrial fibrillation -Continue with Coumadin but at a reduced dose while taking the prednisone as directed--1 tablet daily -P pro time next week - POCT CBC; Future - POCT CBC  2. Essential hypertension -Continue with amlodipine - POCT CBC; Future - BMP8+EGFR; Future - Hepatic function panel; Future - NMR, lipoprofile; Future - POCT CBC  3. Vitamin D deficiency -Vitamin D dosage will be determined by upcoming lab work results - POCT CBC; Future - Vit D  25 hydroxy (rtn osteoporosis monitoring); Future - POCT CBC  4. Metabolic syndrome -Continue with exercise and therapeutic lifestyle changes - POCT CBC; Future - BMP8+EGFR; Future - POCT CBC  5. History of uterine cancer - POCT CBC; Future - POCT CBC  6. Cough -Drink plenty of fluids and take Mucinex maximum strength plain, blue and white in color, 1 twice daily for cough and congestion  7. Bronchitis with bronchospasm -Take antibiotic and prednisone as directed-the prednisone is a 10 mg daily taper #20 pills -Omnicef 300 twice daily for 10 days -Use Symbicort as directed 160/4.5, 2 puffs twice daily and rinse mouth after using -Albuterol inhaler as a rescue inhaler  Patient Instructions                       Medicare Annual Wellness Visit  [facility] and the medical providers at [facility] strive to bring you the best medical care.  In doing so we  not only want to address your current medical conditions and concerns but also to detect new conditions early and prevent illness, disease and health-related problems.    Medicare offers a yearly Wellness Visit which allows our clinical staff to assess your need for preventative services including immunizations, lifestyle education, counseling to decrease risk of preventable diseases and screening for fall risk and other medical concerns.    This visit is provided free of charge (no copay) for all Medicare recipients. The clinical pharmacists at [facility] have begun to conduct these Wellness Visits which will also include a thorough review of all your medications.    As you primary medical provider recommend that you make an appointment for your Annual Wellness Visit if you have not done so already this year.  You may set up this appointment before you leave today  or you may call back [phone] and schedule an appointment.  Please make sure when you call that you mention that you are scheduling your Annual Wellness Visit with the clinical pharmacist so that the appointment may be made for the proper length of time.     Continue current medications. Continue good therapeutic lifestyle changes which include good diet and exercise. Fall precautions discussed with patient. If an FOBT was given today- please return it to our front desk. If you are over 59 years old - you may need Prevnar 29 or the adult Pneumonia vaccine.  Flu Shots will be available at our office starting mid- September. Please call and schedule a FLU CLINIC APPOINTMENT.   Drink plenty of fluids Take Mucinex maximum strength 1 twice daily with a large glass of water for cough and congestion Reduce Coumadin to 1 tablet daily while taking the antibiotic and the prednisone  Take antibiotic as directed Take prednisone as directed Use Symbicort 160/4.5   2 puffs twice a day and rinse mouth after using Use  albuterol inhaler as a rescue inhaler as needed    Arrie Senate MD

## 2014-08-14 ENCOUNTER — Ambulatory Visit (INDEPENDENT_AMBULATORY_CARE_PROVIDER_SITE_OTHER): Payer: Medicare Other | Admitting: Pharmacist

## 2014-08-14 ENCOUNTER — Telehealth: Payer: Self-pay | Admitting: *Deleted

## 2014-08-14 DIAGNOSIS — I4891 Unspecified atrial fibrillation: Secondary | ICD-10-CM

## 2014-08-14 LAB — POCT INR: INR: 1.9

## 2014-08-14 NOTE — Telephone Encounter (Signed)
Please call for patient to come in and get a breathing treatment here which would include albuterol and Pulmicort. We can then prescribed breathing treatments at home until she gets better.

## 2014-08-14 NOTE — Telephone Encounter (Signed)
Pt still has a lot of congestion and short of breath, and hoarse. She has finished prednisone and has only a day or 2 left on the antibiotic. She is using the inhalers.   Any other suggestions? She feels she is not as good as she should be.

## 2014-08-14 NOTE — Patient Instructions (Signed)
Anticoagulation Dose Instructions as of 08/14/2014      Dorene Grebe Tue Wed Thu Fri Sat   New Dose 7.5 mg 10 mg 7.5 mg 10 mg 10 mg 10 mg 7.5 mg    Description        Restart regular warfarin dose of 1 and 1/2 tablet on sundays, tuesdays and saturdays.  Take 2 tablets all other days.      INR was 1.9 today

## 2014-08-15 ENCOUNTER — Ambulatory Visit (INDEPENDENT_AMBULATORY_CARE_PROVIDER_SITE_OTHER): Payer: Medicare Other | Admitting: Family Medicine

## 2014-08-15 ENCOUNTER — Encounter: Payer: Self-pay | Admitting: Family Medicine

## 2014-08-15 ENCOUNTER — Other Ambulatory Visit: Payer: Self-pay | Admitting: *Deleted

## 2014-08-15 VITALS — BP 116/84 | HR 114 | Temp 98.2°F | Ht 64.0 in | Wt 160.0 lb

## 2014-08-15 DIAGNOSIS — R05 Cough: Secondary | ICD-10-CM | POA: Diagnosis not present

## 2014-08-15 DIAGNOSIS — J40 Bronchitis, not specified as acute or chronic: Secondary | ICD-10-CM | POA: Diagnosis not present

## 2014-08-15 DIAGNOSIS — J439 Emphysema, unspecified: Secondary | ICD-10-CM | POA: Diagnosis not present

## 2014-08-15 DIAGNOSIS — R0602 Shortness of breath: Secondary | ICD-10-CM

## 2014-08-15 DIAGNOSIS — J209 Acute bronchitis, unspecified: Secondary | ICD-10-CM

## 2014-08-15 DIAGNOSIS — R059 Cough, unspecified: Secondary | ICD-10-CM

## 2014-08-15 MED ORDER — METHYLPREDNISOLONE ACETATE 80 MG/ML IJ SUSP
60.0000 mg | Freq: Once | INTRAMUSCULAR | Status: AC
  Administered 2014-08-15: 60 mg via INTRAMUSCULAR

## 2014-08-15 MED ORDER — ALBUTEROL SULFATE (2.5 MG/3ML) 0.083% IN NEBU
2.5000 mg | INHALATION_SOLUTION | RESPIRATORY_TRACT | Status: AC
Start: 2014-08-15 — End: 2014-08-15
  Administered 2014-08-15: 2.5 mg via RESPIRATORY_TRACT

## 2014-08-15 MED ORDER — BUDESONIDE 0.5 MG/2ML IN SUSP
0.2500 mg | RESPIRATORY_TRACT | Status: AC
  Administered 2014-08-15: 0.25 mg via RESPIRATORY_TRACT

## 2014-08-15 MED ORDER — ALBUTEROL SULFATE (2.5 MG/3ML) 0.083% IN NEBU: 2.5000 mg | INHALATION_SOLUTION | Freq: Four times a day (QID) | RESPIRATORY_TRACT | Status: DC | PRN

## 2014-08-15 MED ORDER — BUDESONIDE 0.5 MG/2ML IN SUSP: 0.5000 mg | Freq: Two times a day (BID) | RESPIRATORY_TRACT | Status: DC

## 2014-08-15 NOTE — Telephone Encounter (Signed)
Pt given an appt - she stated she may not be able to make it but will try

## 2014-08-15 NOTE — Addendum Note (Signed)
Addended by: Zannie Cove on: 08/15/2014 03:54 PM   Modules accepted: Orders

## 2014-08-15 NOTE — Progress Notes (Signed)
Subjective:    Patient ID: Melinda Collins, female    DOB: October 05, 1940, 74 y.o.   MRN: [id number]  HPI Patient here today for follow up on cough and congestion. She has finished her prednisone and still has a couple of the antibiotics left. She is not feeling any better. The patient is concerned that she might have pneumonia. She had a recent chest x-ray and a CBC and a chest x-ray was read by the radiologist as no active disease other than COPD. The CBC had a white count was not elevated. The patient has a nebulizer at home that she just been using inhalers and not the nebulizer.        Patient Active Problem List   Diagnosis Date Noted  . Metabolic syndrome [id number]  . History of uterine cancer 12/03/2013  . Chronic low back pain 05/23/2013  . High risk medication use 05/23/2013  . Swelling of limb 05/03/2013  . Hypertension 05/17/2011  . HYPERLIPIDEMIA 02/10/2009  . TOBACCO ABUSE 02/10/2009  . 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 Atrial fibrillation 02/10/2009   Outpatient Encounter Prescriptions as of 08/15/2014  Medication Sig  . amLODipine (NORVASC) 5 MG tablet TAKE ONE TABLET BY MOUTH ONCE DAILY  . atorvastatin (LIPITOR) 80 MG tablet TAKE ONE TABLET BY MOUTH DAILY  . azelastine (ASTELIN) 0.1 % nasal spray USE ONE SPRAY IN EACH NOSTRIL TWICE DAILY  . cefdinir (OMNICEF) 300 MG capsule Take 1 capsule (300 mg total) by mouth 2 (two) times daily.  . fluticasone (FLONASE) 50 MCG/ACT nasal spray USE 2 SPRAYS IN EACH NOSTRIL ONCE DAILY.  SHAKE GENTLY BEFORE USING.  . Multiple Vitamins-Minerals (CENTRUM SILVER PO) Take 1 tablet by mouth daily.    . nicotine (NICODERM CQ - DOSED IN MG/24 HOURS) 14 mg/24hr patch Place 1 patch (14 mg total) onto the skin daily.  . NON FORMULARY Oxygen 3L/min   . oxyCODONE-acetaminophen (PERCOCET) 5-325 MG per tablet Take 2 tablets by mouth every 8 (eight) hours as needed.    Marland Kitchen PARoxetine (PAXIL) 40 MG tablet TAKE ONE TABLET BY MOUTH DAILY  .  PROAIR HFA 108 (90 BASE) MCG/ACT inhaler INHALE ONE PUFF INTO THE LUNGS FOUR TIMES DAILY AS NEEDED FOR WHEEZING  . torsemide (DEMADEX) 20 MG tablet TAKE 1 AND 1/2 TABLETS BY MOUTH ONCE DAILY. (Patient taking differently: 1 tablet daily)  . warfarin (COUMADIN) 5 MG tablet TAKE 1 TO 2 TABLETS BY MOUTH AS DIRECTED BY ANTICOAGULATION CLINIC  . [DISCONTINUED] predniSONE (DELTASONE) 10 MG tablet TAPER- 1 tab by mouth four times daily for 2 days, 1 tab by mouth three times daily for 2 days, 1 tab by mouth twice a day for 2 days, then 1 tab by mouth daily for 2 days, then stop.    Review of Systems  Constitutional: Negative.   HENT: Positive for congestion.   Eyes: Negative.   Respiratory: Positive for cough and shortness of breath.   Cardiovascular: Negative.   Gastrointestinal: Negative.   Endocrine: Negative.   Genitourinary: Negative.   Musculoskeletal: Negative.   Skin: Negative.   Allergic/Immunologic: Negative.   Neurological: Negative.   Hematological: Negative.   Psychiatric/Behavioral: Negative.        Objective:   Physical Exam  Constitutional: She is oriented to person, place, and time. She appears well-developed and well-nourished. No distress.  HENT:  Head: Normocephalic and atraumatic.  Eyes: Conjunctivae and EOM are normal. Pupils are equal, round, and reactive to light. Right eye exhibits no discharge. Left eye exhibits  no discharge.  Neck: Normal range of motion. Neck supple.  Cardiovascular: Normal rate, regular rhythm and normal heart sounds.   No murmur heard. Pulmonary/Chest: Effort normal. No respiratory distress. She has wheezes. She has no rales.  The patient continues to have a tight cough and there is minimal wheezing present today.  Musculoskeletal: Normal range of motion.  Lymphadenopathy:    She has no cervical adenopathy.  Neurological: She is alert and oriented to person, place, and time.  Skin: Skin is warm and dry. No rash noted.  Psychiatric: She has  a normal mood and affect. Her behavior is normal. Judgment and thought content normal.  Nursing note and vitals reviewed.  BP 116/84 mmHg  Pulse 114  Temp(Src) 98.2 F (36.8 C) (Oral)  Ht 5\' 4"  (1.626 m)  Wt 160 lb (72.576 kg)  BMI 27.45 kg/m2  SpO2 97%  The patient was given a breathing treatment with Pulmicort and albuterol Following the nebulizer treatment the patient indicated that she felt like she was breathing better and when I listened to her she was moving more air with less congestion      Assessment & Plan:  1. SOB (shortness of breath) - albuterol (PROVENTIL) (2.5 MG/3ML) 0.083% nebulizer solution 2.5 mg; Take 3 mLs (2.5 mg total) by nebulization stat. - budesonide (PULMICORT) nebulizer solution 0.25 mg; Take 1 mL (0.25 mg total) by nebulization stat. - methylPREDNISolone acetate (DEPO-MEDROL) injection 60 mg; Inject 0.75 mLs (60 mg total) into the muscle once.  2. Cough -Continue Mucinex -Continue drinking plenty of fluids - albuterol (PROVENTIL) (2.5 MG/3ML) 0.083% nebulizer solution 2.5 mg; Take 3 mLs (2.5 mg total) by nebulization stat. - budesonide (PULMICORT) nebulizer solution 0.25 mg; Take 1 mL (0.25 mg total) by nebulization stat. - methylPREDNISolone acetate (DEPO-MEDROL) injection 60 mg; Inject 0.75 mLs (60 mg total) into the muscle once.  3. Bronchitis with bronchospasm -Use nebulizer as directed with albuterol and Pulmicort - methylPREDNISolone acetate (DEPO-MEDROL) injection 60 mg; Inject 0.75 mLs (60 mg total) into the muscle once.  4. Pulmonary emphysema, unspecified emphysema type -Drink plenty of fluids, use cool mist humidifier, use nebulizer as directed with albuterol and Pulmicort - methylPREDNISolone acetate (DEPO-MEDROL) injection 60 mg; Inject 0.75 mLs (60 mg total) into the muscle once.  Patient Instructions  The patient should use the nebulizer at home instead of her inhalers. She should do albuterol neb 4 times daily and add to that the  Pulmicort twice daily. She should continue with Mucinex twice daily She should drink plenty of fluids and continue to use the humidification in her home If she gets worse she should call us sooner otherwise return to clinic on Tuesday Continue Coumadin as directed by the clinical pharmacist Finish the antibiotic that you have at home   Arrie Senate MD

## 2014-08-15 NOTE — Patient Instructions (Addendum)
The patient should use the nebulizer at home instead of her inhalers. She should do albuterol neb 4 times daily and add to that the Pulmicort twice daily. She should continue with Mucinex twice daily She should drink plenty of fluids and continue to use the humidification in her home If she gets worse she should call us sooner otherwise return to clinic on Tuesday Continue Coumadin as directed by the clinical pharmacist Finish the antibiotic that you have at home

## 2014-08-18 DIAGNOSIS — H2513 Age-related nuclear cataract, bilateral: Secondary | ICD-10-CM | POA: Diagnosis not present

## 2014-08-18 DIAGNOSIS — H40023 Open angle with borderline findings, high risk, bilateral: Secondary | ICD-10-CM | POA: Diagnosis not present

## 2014-08-19 ENCOUNTER — Ambulatory Visit: Payer: Medicare Other | Admitting: Family Medicine

## 2014-08-19 DIAGNOSIS — H2512 Age-related nuclear cataract, left eye: Secondary | ICD-10-CM | POA: Diagnosis not present

## 2014-08-19 NOTE — Patient Instructions (Signed)
Your procedure is scheduled on: 08/25/2014  Report to [facility] at  1100  AM.  Call this number if you have problems the morning of surgery: [phone]   Do not eat food or drink liquids :After Midnight.      Take these medicines the morning of surgery with A SIP OF WATER: amlodipine, percocet, paxil. Take your inhalers before you come.   Do not wear jewelry, make-up or nail polish.  Do not wear lotions, powders, or perfumes.  Do not shave 48 hours prior to surgery.  Do not bring valuables to the hospital.  Contacts, dentures or bridgework may not be worn into surgery.  Leave suitcase in the car. After surgery it may be brought to your room.  For patients admitted to the hospital, checkout time is 11:00 AM the day of discharge.   Patients discharged the day of surgery will not be allowed to drive home.  :     Please read over the following fact sheets that you were given: Coughing and Deep Breathing, Surgical Site Infection Prevention, Anesthesia Post-op Instructions and Care and Recovery After Surgery    Cataract A cataract is a clouding of the lens of the eye. When a lens becomes cloudy, vision is reduced based on the degree and nature of the clouding. Many cataracts reduce vision to some degree. Some cataracts make people more near-sighted as they develop. Other cataracts increase glare. Cataracts that are ignored and become worse can sometimes look white. The white color can be seen through the pupil. CAUSES   Aging. However, cataracts may occur at any age, even in newborns.   Certain drugs.   Trauma to the eye.   Certain diseases such as diabetes.   Specific eye diseases such as chronic inflammation inside the eye or a sudden attack of a rare form of glaucoma.   Inherited or acquired medical problems.  SYMPTOMS   Gradual, progressive drop in vision in the affected eye.   Severe, rapid visual loss. This most often happens when trauma is the cause.  DIAGNOSIS  To detect a  cataract, an eye doctor examines the lens. Cataracts are best diagnosed with an exam of the eyes with the pupils enlarged (dilated) by drops.  TREATMENT  For an early cataract, vision may improve by using different eyeglasses or stronger lighting. If that does not help your vision, surgery is the only effective treatment. A cataract needs to be surgically removed when vision loss interferes with your everyday activities, such as driving, reading, or watching TV. A cataract may also have to be removed if it prevents examination or treatment of another eye problem. Surgery removes the cloudy lens and usually replaces it with a substitute lens (intraocular lens, IOL).  At a time when both you and your doctor agree, the cataract will be surgically removed. If you have cataracts in both eyes, only one is usually removed at a time. This allows the operated eye to heal and be out of danger from any possible problems after surgery (such as infection or poor wound healing). In rare cases, a cataract may be doing damage to your eye. In these cases, your caregiver may advise surgical removal right away. The vast majority of people who have cataract surgery have better vision afterward. HOME CARE INSTRUCTIONS  If you are not planning surgery, you may be asked to do the following:  Use different eyeglasses.   Use stronger or brighter lighting.   Ask your eye doctor about reducing  your medicine dose or changing medicines if it is thought that a medicine caused your cataract. Changing medicines does not make the cataract go away on its own.   Become familiar with your surroundings. Poor vision can lead to injury. Avoid bumping into things on the affected side. You are at a higher risk for tripping or falling.   Exercise extreme care when driving or operating machinery.   Wear sunglasses if you are sensitive to bright light or experiencing problems with glare.  SEEK IMMEDIATE MEDICAL CARE IF:   You have a  worsening or sudden vision loss.   You notice redness, swelling, or increasing pain in the eye.   You have a fever.  Document Released: 07/04/2005 Document Revised: 06/23/2011 Document Reviewed: 02/25/2011 [facility] Patient Information 2012 [place].PATIENT INSTRUCTIONS POST-ANESTHESIA  IMMEDIATELY FOLLOWING SURGERY:  Do not drive or operate machinery for the first twenty four hours after surgery.  Do not make any important decisions for twenty four hours after surgery or while taking narcotic pain medications or sedatives.  If you develop intractable nausea and vomiting or a severe headache please notify your doctor immediately.  FOLLOW-UP:  Please make an appointment with your surgeon as instructed. You do not need to follow up with anesthesia unless specifically instructed to do so.  WOUND CARE INSTRUCTIONS (if applicable):  Keep a dry clean dressing on the anesthesia/puncture wound site if there is drainage.  Once the wound has quit draining you may leave it open to air.  Generally you should leave the bandage intact for twenty four hours unless there is drainage.  If the epidural site drains for more than 36-48 hours please call the anesthesia department.  QUESTIONS?:  Please feel free to call your physician or the hospital operator if you have any questions, and they will be happy to assist you.

## 2014-08-20 ENCOUNTER — Encounter (HOSPITAL_COMMUNITY): Payer: Self-pay

## 2014-08-20 ENCOUNTER — Encounter (HOSPITAL_COMMUNITY)
Admission: RE | Admit: 2014-08-20 | Discharge: 2014-08-20 | Disposition: A | Discharge status: Home / Self Care | Payer: Medicare Other | Source: Ambulatory Visit | Attending: Ophthalmology | Admitting: Ophthalmology

## 2014-08-20 ENCOUNTER — Other Ambulatory Visit: Payer: Self-pay

## 2014-08-20 DIAGNOSIS — H2512 Age-related nuclear cataract, left eye: Secondary | ICD-10-CM | POA: Insufficient documentation

## 2014-08-20 DIAGNOSIS — Z01818 Encounter for other preprocedural examination: Secondary | ICD-10-CM | POA: Insufficient documentation

## 2014-08-20 HISTORY — DX: Unspecified osteoarthritis, unspecified site: M19.90

## 2014-08-20 HISTORY — DX: Unspecified hearing loss, unspecified ear: H91.90

## 2014-08-20 HISTORY — DX: Major depressive disorder, single episode, unspecified: F32.9

## 2014-08-20 HISTORY — DX: Depression, unspecified: F32.A

## 2014-08-20 LAB — BASIC METABOLIC PANEL
Anion gap: 7 (ref 5–15)
BUN: 47 mg/dL — ABNORMAL HIGH (ref 6–23)
CO2: 24 mmol/L (ref 19–32)
Calcium: 8.7 mg/dL (ref 8.4–10.5)
Chloride: 109 mmol/L (ref 96–112)
Creatinine, Ser: 1.93 mg/dL — ABNORMAL HIGH (ref 0.50–1.10)
GFR calc Af Amer: 29 mL/min — ABNORMAL LOW (ref >90)
GFR calc non Af Amer: 25 mL/min — ABNORMAL LOW (ref >90)
Glucose, Bld: 86 mg/dL (ref 70–99)
POTASSIUM: 4 mmol/L (ref 3.5–5.1)
SODIUM: 140 mmol/L (ref 135–145)

## 2014-08-20 NOTE — Pre-Procedure Instructions (Signed)
Patient given information to sign up for my chart at home. 

## 2014-08-22 MED ORDER — NEOMYCIN-POLYMYXIN-DEXAMETH 3.5-10000-0.1 OP SUSP
OPHTHALMIC | Status: AC
  Filled 2014-08-22: qty 5

## 2014-08-22 MED ORDER — LIDOCAINE HCL (PF) 1 % IJ SOLN
INTRAMUSCULAR | Status: AC
  Filled 2014-08-22: qty 2

## 2014-08-22 MED ORDER — CYCLOPENTOLATE-PHENYLEPHRINE OP SOLN OPTIME - NO CHARGE
OPHTHALMIC | Status: AC
  Filled 2014-08-22: qty 2

## 2014-08-22 MED ORDER — LIDOCAINE HCL 3.5 % OP GEL
OPHTHALMIC | Status: AC
  Filled 2014-08-22: qty 1

## 2014-08-22 MED ORDER — TETRACAINE HCL 0.5 % OP SOLN
OPHTHALMIC | Status: AC
  Filled 2014-08-22: qty 2

## 2014-08-22 MED ORDER — PHENYLEPHRINE HCL 2.5 % OP SOLN
OPHTHALMIC | Status: AC
  Filled 2014-08-22: qty 15

## 2014-08-25 ENCOUNTER — Ambulatory Visit (HOSPITAL_COMMUNITY)
Admission: RE | Admit: 2014-08-25 | Discharge: 2014-08-25 | Disposition: A | Discharge status: Home / Self Care | Payer: Medicare Other | Source: Ambulatory Visit | Attending: Ophthalmology | Admitting: Ophthalmology

## 2014-08-25 ENCOUNTER — Ambulatory Visit (HOSPITAL_COMMUNITY): Payer: Medicare Other | Admitting: Anesthesiology

## 2014-08-25 ENCOUNTER — Encounter (HOSPITAL_COMMUNITY): Payer: Self-pay | Admitting: *Deleted

## 2014-08-25 ENCOUNTER — Encounter (HOSPITAL_COMMUNITY)
Admission: RE | Disposition: A | Discharge status: Home / Self Care | Payer: Self-pay | Source: Ambulatory Visit | Attending: Ophthalmology

## 2014-08-25 DIAGNOSIS — H2512 Age-related nuclear cataract, left eye: Secondary | ICD-10-CM | POA: Insufficient documentation

## 2014-08-25 DIAGNOSIS — H259 Unspecified age-related cataract: Secondary | ICD-10-CM | POA: Diagnosis not present

## 2014-08-25 HISTORY — PX: CATARACT EXTRACTION W/PHACO: SHX586

## 2014-08-25 SURGERY — PHACOEMULSIFICATION, CATARACT, WITH IOL INSERTION
Anesthesia: Monitor Anesthesia Care | Site: Eye | Laterality: Left

## 2014-08-25 MED ORDER — EPINEPHRINE HCL 1 MG/ML IJ SOLN
INTRAOCULAR | Status: DC | PRN
  Administered 2014-08-25: 500 mL

## 2014-08-25 MED ORDER — LIDOCAINE HCL (PF) 1 % IJ SOLN
INTRAMUSCULAR | Status: DC | PRN
  Administered 2014-08-25: .4 mL

## 2014-08-25 MED ORDER — LIDOCAINE HCL 3.5 % OP GEL
1.0000 "application " | Freq: Once | OPHTHALMIC | Status: AC
  Administered 2014-08-25: 1 via OPHTHALMIC

## 2014-08-25 MED ORDER — EPINEPHRINE HCL 1 MG/ML IJ SOLN
INTRAMUSCULAR | Status: AC
  Filled 2014-08-25: qty 1

## 2014-08-25 MED ORDER — MIDAZOLAM HCL 2 MG/2ML IJ SOLN
1.0000 mg | INTRAMUSCULAR | Status: DC | PRN
  Administered 2014-08-25: 2 mg via INTRAVENOUS

## 2014-08-25 MED ORDER — LACTATED RINGERS IV SOLN
INTRAVENOUS | Status: DC
  Administered 2014-08-25: 12:00:00 via INTRAVENOUS

## 2014-08-25 MED ORDER — PROVISC 10 MG/ML IO SOLN
INTRAOCULAR | Status: DC | PRN
  Administered 2014-08-25: 0.85 mL via INTRAOCULAR

## 2014-08-25 MED ORDER — FENTANYL CITRATE 0.05 MG/ML IJ SOLN
25.0000 ug | INTRAMUSCULAR | Status: AC
  Administered 2014-08-25 (×2): 25 ug via INTRAVENOUS

## 2014-08-25 MED ORDER — POVIDONE-IODINE 5 % OP SOLN
OPHTHALMIC | Status: DC | PRN
  Administered 2014-08-25: 1 via OPHTHALMIC

## 2014-08-25 MED ORDER — BSS IO SOLN
INTRAOCULAR | Status: DC | PRN
  Administered 2014-08-25: 15 mL

## 2014-08-25 MED ORDER — LIDOCAINE 3.5 % OP GEL OPTIME - NO CHARGE
OPHTHALMIC | Status: DC | PRN
  Administered 2014-08-25: 1 [drp] via OPHTHALMIC

## 2014-08-25 MED ORDER — PHENYLEPHRINE HCL 2.5 % OP SOLN
1.0000 [drp] | OPHTHALMIC | Status: AC
  Administered 2014-08-25 (×3): 1 [drp] via OPHTHALMIC

## 2014-08-25 MED ORDER — NEOMYCIN-POLYMYXIN-DEXAMETH 3.5-10000-0.1 OP SUSP
OPHTHALMIC | Status: DC | PRN
  Administered 2014-08-25: 2 [drp] via OPHTHALMIC

## 2014-08-25 MED ORDER — CYCLOPENTOLATE-PHENYLEPHRINE 0.2-1 % OP SOLN
1.0000 [drp] | OPHTHALMIC | Status: AC
  Administered 2014-08-25 (×3): 1 [drp] via OPHTHALMIC

## 2014-08-25 MED ORDER — TETRACAINE HCL 0.5 % OP SOLN
1.0000 [drp] | OPHTHALMIC | Status: AC
  Administered 2014-08-25 (×3): 1 [drp] via OPHTHALMIC

## 2014-08-25 MED ORDER — MIDAZOLAM HCL 2 MG/2ML IJ SOLN
INTRAMUSCULAR | Status: AC
  Filled 2014-08-25: qty 2

## 2014-08-25 MED ORDER — FENTANYL CITRATE 0.05 MG/ML IJ SOLN
INTRAMUSCULAR | Status: AC
  Filled 2014-08-25: qty 2

## 2014-08-25 SURGICAL SUPPLY — 11 items
CLOTH BEACON ORANGE TIMEOUT ST (SAFETY) ×3 IMPLANT
EYE SHIELD UNIVERSAL CLEAR (GAUZE/BANDAGES/DRESSINGS) ×3 IMPLANT
GLOVE BIOGEL PI IND STRL 6.5 (GLOVE) ×1 IMPLANT
GLOVE BIOGEL PI INDICATOR 6.5 (GLOVE) ×2
GLOVE EXAM NITRILE MD LF STRL (GLOVE) ×3 IMPLANT
PAD ARMBOARD 7.5X6 YLW CONV (MISCELLANEOUS) ×3 IMPLANT
SIGHTPATH CAT PROC W REG LENS (Ophthalmic Related) ×3 IMPLANT
SYRINGE LUER LOK 1CC (MISCELLANEOUS) ×3 IMPLANT
TAPE SURG TRANSPORE 1 IN (GAUZE/BANDAGES/DRESSINGS) ×1 IMPLANT
TAPE SURGICAL TRANSPORE 1 IN (GAUZE/BANDAGES/DRESSINGS) ×2
WATER STERILE IRR 250ML POUR (IV SOLUTION) ×3 IMPLANT

## 2014-08-25 NOTE — H&P (Signed)
I have reviewed the H&P, the patient was re-examined, and I have identified no interval changes in medical condition and plan of care since the history and physical of record  

## 2014-08-25 NOTE — Discharge Instructions (Signed)

## 2014-08-25 NOTE — Anesthesia Postprocedure Evaluation (Signed)
  Anesthesia Post-op Note  Patient: Melinda Collins  Procedure(s) Performed: Procedure(s) with comments: CATARACT EXTRACTION PHACO AND INTRAOCULAR LENS PLACEMENT (IOC) (Left) - CDE 6.95  Patient Location: Short Stay  Anesthesia Type:MAC  Level of Consciousness: awake, alert , oriented and patient cooperative  Airway and Oxygen Therapy: Patient Spontanous Breathing  Post-op Pain: none  Post-op Assessment: Post-op Vital signs reviewed, Patient's Cardiovascular Status Stable, Respiratory Function Stable, Patent Airway and No signs of Nausea or vomiting  Post-op Vital Signs: Reviewed and stable  Last Vitals:  Filed Vitals:   08/25/14 1220  BP: 123/84  Resp: 18    Complications: No apparent anesthesia complications

## 2014-08-25 NOTE — Anesthesia Preprocedure Evaluation (Signed)
Anesthesia Evaluation  Patient identified by MRN, date of birth, ID band Patient awake    Reviewed: Allergy & Precautions, NPO status , Patient's Chart, lab work & pertinent test results  Airway Mallampati: II  TM Distance: >3 FB     Dental  (+) Teeth Intact   Pulmonary COPD COPD inhaler, former smoker,  breath sounds clear to auscultation        Cardiovascular hypertension, + dysrhythmias Atrial Fibrillation Rhythm:Regular Rate:Normal     Neuro/Psych PSYCHIATRIC DISORDERS Depression    GI/Hepatic   Endo/Other    Renal/GU Renal InsufficiencyRenal disease     Musculoskeletal  (+) Arthritis -,   Abdominal   Peds  Hematology   Anesthesia Other Findings   Reproductive/Obstetrics                             Anesthesia Physical Anesthesia Plan  ASA: III  Anesthesia Plan: MAC   Post-op Pain Management:    Induction: Intravenous  Airway Management Planned: Nasal Cannula  Additional Equipment:   Intra-op Plan:   Post-operative Plan:   Informed Consent: I have reviewed the patients History and Physical, chart, labs and discussed the procedure including the risks, benefits and alternatives for the proposed anesthesia with the patient or authorized representative who has indicated his/her understanding and acceptance.     Plan Discussed with:   Anesthesia Plan Comments:         Anesthesia Quick Evaluation

## 2014-08-25 NOTE — Anesthesia Procedure Notes (Signed)
Procedure Name: MAC Date/Time: 08/25/2014 12:23 PM Performed by: Andree Elk, Dionisio Aragones A Pre-anesthesia Checklist: Patient identified, Timeout performed, Emergency Drugs available, Suction available and Patient being monitored Oxygen Delivery Method: Nasal cannula

## 2014-08-25 NOTE — Transfer of Care (Signed)
Immediate Anesthesia Transfer of Care Note  Patient: Melinda Collins  Procedure(s) Performed: Procedure(s) with comments: CATARACT EXTRACTION PHACO AND INTRAOCULAR LENS PLACEMENT (IOC) (Left) - CDE 6.95  Patient Location: Short Stay  Anesthesia Type:MAC  Level of Consciousness: awake, alert , oriented and patient cooperative  Airway & Oxygen Therapy: Patient Spontanous Breathing  Post-op Assessment: Report given to RN and Post -op Vital signs reviewed and stable  Post vital signs: Reviewed and stable  Last Vitals:  Filed Vitals:   08/25/14 1220  BP: 123/84  Resp: 18    Complications: No apparent anesthesia complications

## 2014-08-25 NOTE — Op Note (Signed)
Date of Admission: 08/25/2014  Date of Surgery: 08/25/2014   Pre-Op Dx: Cataract Left Eye  Post-Op Dx: Senile Nuclear Cataract Left  Eye,  Dx Code H25.12  Surgeon: Tonny Branch, M.D.  Assistants: None  Anesthesia: Topical with MAC  Indications: Painless, progressive loss of vision with compromise of daily activities.  Surgery: Cataract Extraction with Intraocular lens Implant Left Eye  Discription: The patient had dilating drops and viscous lidocaine placed into the Left eye in the pre-op holding area. After transfer to the operating room, a time out was performed. The patient was then prepped and draped. Beginning with a 71 degree blade a paracentesis port was made at the surgeon's 2 o'clock position. The anterior chamber was then filled with 1% non-preserved lidocaine. This was followed by filling the anterior chamber with Provisc.  A 2.1mm keratome blade was used to make a clear corneal incision at the temporal limbus.  A bent cystatome needle was used to create a continuous tear capsulotomy. Hydrodissection was performed with balanced salt solution on a Fine canula. The lens nucleus was then removed using the phacoemulsification handpiece. Residual cortex was removed with the I&A handpiece. The anterior chamber and capsular bag were refilled with Provisc. A posterior chamber intraocular lens was placed into the capsular bag with it's injector. The implant was positioned with the Kuglan hook. The Provisc was then removed from the anterior chamber and capsular bag with the I&A handpiece. Stromal hydration of the main incision and paracentesis port was performed with BSS on a Fine canula. The wounds were tested for leak which was negative. The patient tolerated the procedure well. There were no operative complications. The patient was then transferred to the recovery room in stable condition.  Complications: None  Specimen: None  EBL: None  Prosthetic device: Hoya iSert 250, power 16.5 D, SN  [id number].

## 2014-08-26 ENCOUNTER — Telehealth: Payer: Self-pay | Admitting: *Deleted

## 2014-08-26 ENCOUNTER — Encounter (HOSPITAL_COMMUNITY): Payer: Self-pay | Admitting: Ophthalmology

## 2014-08-26 NOTE — Telephone Encounter (Signed)
Pt was called to see how she is feeling.  She is feeling some better with the cold/ cough  She had her eye done yesterday and her eye and neck is now bothering her.  She goes back to them in about 2 weeks  She is aware to call us if she needs anything.

## 2014-08-27 ENCOUNTER — Other Ambulatory Visit: Payer: Self-pay | Admitting: Family Medicine

## 2014-08-28 ENCOUNTER — Encounter: Payer: Self-pay | Admitting: Pharmacist

## 2014-08-28 ENCOUNTER — Ambulatory Visit (INDEPENDENT_AMBULATORY_CARE_PROVIDER_SITE_OTHER): Payer: Medicare Other | Admitting: Pharmacist

## 2014-08-28 ENCOUNTER — Encounter: Payer: Self-pay | Admitting: *Deleted

## 2014-08-28 DIAGNOSIS — I4891 Unspecified atrial fibrillation: Secondary | ICD-10-CM

## 2014-08-28 DIAGNOSIS — Z720 Tobacco use: Secondary | ICD-10-CM

## 2014-08-28 LAB — POCT INR: INR: 3.8

## 2014-08-28 MED ORDER — NICOTINE 21 MG/24HR TD PT24: 21.0000 mg | MEDICATED_PATCH | Freq: Every day | TRANSDERMAL | Status: DC

## 2014-08-28 NOTE — Progress Notes (Signed)
Subjective:     [name] is a 74 y.o. female here for a discussion regarding smoking cessation. and recheck INR.  She began smoking several years ago. She currently smokes 3 to 4 cigarettes per day. She has attempted to quit smoking in the past, most recently she has decrease how muchshe issmoking significantly . Best success quitting using nicotine patch. Barriers to quitting include: under a lot of stress now. She denies chest pain and dyspnea on exertion. Patient is currently using Nicotine patches 14mg  daily  Objective:   INR was 3.8 today Assessment:    Tobacco Use/Cessation.  I assessed [name] G Ibanez to be in an action stage with respect to tobacco use.    Plan:    I advised patient to quit, and offered support. Discussed current use pattern. Nicotine patches increase to 21 mg.   Anticoagulation Dose Instructions as of 08/28/2014      Sun Mon Tue Wed Thu Fri Sat   New Dose 7.5 mg 10 mg 7.5 mg 10 mg 7.5 mg 10 mg 7.5 mg    Description        No warfarin today - Thursday, February 11th, then decrease warfarin dose to 1 and 1/2 tablet on sundays, tuesdays, thursdays and saturdays.  Take 2 tablets mondays, wednesdays and fridays.      Recheck in 1 week  Cherre Robins, PharmD, CPP

## 2014-08-28 NOTE — Patient Instructions (Signed)
Anticoagulation Dose Instructions as of 08/28/2014      Sun Mon Tue Wed Thu Fri Sat   New Dose 7.5 mg 10 mg 7.5 mg 10 mg 7.5 mg 10 mg 7.5 mg    Description        No warfarin today - Thursday, February 11th, then decrease warfarin dose to 1 and 1/2 tablet on sundays, tuesdays, thursdays and saturdays.  Take 2 tablets mondays, wednesdays and fridays.       INR was 3.8 today

## 2014-08-29 ENCOUNTER — Other Ambulatory Visit: Payer: Self-pay | Admitting: Family Medicine

## 2014-09-04 ENCOUNTER — Encounter: Payer: Self-pay | Admitting: Pharmacist

## 2014-09-04 ENCOUNTER — Ambulatory Visit (INDEPENDENT_AMBULATORY_CARE_PROVIDER_SITE_OTHER): Payer: Medicare Other | Admitting: Pharmacist

## 2014-09-04 VITALS — BP 125/85 | HR 78

## 2014-09-04 DIAGNOSIS — I4821 Permanent atrial fibrillation: Secondary | ICD-10-CM

## 2014-09-04 DIAGNOSIS — I482 Chronic atrial fibrillation: Secondary | ICD-10-CM

## 2014-09-04 DIAGNOSIS — H2512 Age-related nuclear cataract, left eye: Secondary | ICD-10-CM | POA: Diagnosis not present

## 2014-09-04 LAB — POCT INR: INR: 3

## 2014-09-04 NOTE — Patient Instructions (Signed)
Anticoagulation Dose Instructions as of 09/04/2014      Melinda Collins Tue Wed Thu Fri Sat   New Dose 7.5 mg 10 mg 7.5 mg 10 mg 7.5 mg 10 mg 7.5 mg    Description        Continue current warfarin dose of 1 and 1/2 tablet on sundays, tuesdays, thursdays and saturdays.  Take 2 tablets mondays, wednesdays and fridays.       INR was 3.0 today

## 2014-09-08 ENCOUNTER — Encounter (HOSPITAL_COMMUNITY): Payer: Self-pay

## 2014-09-08 DIAGNOSIS — H2511 Age-related nuclear cataract, right eye: Secondary | ICD-10-CM | POA: Diagnosis not present

## 2014-09-09 ENCOUNTER — Encounter (HOSPITAL_COMMUNITY)
Admission: RE | Admit: 2014-09-09 | Discharge: 2014-09-09 | Disposition: A | Discharge status: Home / Self Care | Payer: Medicare Other | Source: Ambulatory Visit | Attending: Ophthalmology | Admitting: Ophthalmology

## 2014-09-10 MED ORDER — LIDOCAINE HCL 3.5 % OP GEL
OPHTHALMIC | Status: AC
  Filled 2014-09-10: qty 1

## 2014-09-10 MED ORDER — NEOMYCIN-POLYMYXIN-DEXAMETH 3.5-10000-0.1 OP SUSP
OPHTHALMIC | Status: AC
Start: 2014-09-10 — End: 2014-09-10
  Filled 2014-09-10: qty 5

## 2014-09-10 MED ORDER — LIDOCAINE HCL (PF) 1 % IJ SOLN
INTRAMUSCULAR | Status: AC
  Filled 2014-09-10: qty 2

## 2014-09-10 MED ORDER — TETRACAINE HCL 0.5 % OP SOLN
OPHTHALMIC | Status: AC
  Filled 2014-09-10: qty 2

## 2014-09-10 MED ORDER — PHENYLEPHRINE HCL 2.5 % OP SOLN
OPHTHALMIC | Status: AC
  Filled 2014-09-10: qty 15

## 2014-09-10 MED ORDER — CYCLOPENTOLATE-PHENYLEPHRINE OP SOLN OPTIME - NO CHARGE
OPHTHALMIC | Status: AC
  Filled 2014-09-10: qty 2

## 2014-09-11 ENCOUNTER — Ambulatory Visit (HOSPITAL_COMMUNITY)
Admission: RE | Admit: 2014-09-11 | Discharge: 2014-09-11 | Disposition: A | Discharge status: Home / Self Care | Payer: Medicare Other | Source: Ambulatory Visit | Attending: Ophthalmology | Admitting: Ophthalmology

## 2014-09-11 ENCOUNTER — Ambulatory Visit (HOSPITAL_COMMUNITY): Payer: Medicare Other | Admitting: Anesthesiology

## 2014-09-11 ENCOUNTER — Encounter (HOSPITAL_COMMUNITY): Payer: Self-pay | Admitting: *Deleted

## 2014-09-11 ENCOUNTER — Encounter (HOSPITAL_COMMUNITY)
Admission: RE | Disposition: A | Discharge status: Home / Self Care | Payer: Self-pay | Source: Ambulatory Visit | Attending: Ophthalmology

## 2014-09-11 DIAGNOSIS — Z87891 Personal history of nicotine dependence: Secondary | ICD-10-CM | POA: Diagnosis not present

## 2014-09-11 DIAGNOSIS — H269 Unspecified cataract: Secondary | ICD-10-CM | POA: Diagnosis present

## 2014-09-11 DIAGNOSIS — F329 Major depressive disorder, single episode, unspecified: Secondary | ICD-10-CM | POA: Insufficient documentation

## 2014-09-11 DIAGNOSIS — H259 Unspecified age-related cataract: Secondary | ICD-10-CM | POA: Diagnosis not present

## 2014-09-11 DIAGNOSIS — J449 Chronic obstructive pulmonary disease, unspecified: Secondary | ICD-10-CM | POA: Insufficient documentation

## 2014-09-11 DIAGNOSIS — H2511 Age-related nuclear cataract, right eye: Secondary | ICD-10-CM | POA: Diagnosis not present

## 2014-09-11 DIAGNOSIS — I10 Essential (primary) hypertension: Secondary | ICD-10-CM | POA: Insufficient documentation

## 2014-09-11 DIAGNOSIS — I4891 Unspecified atrial fibrillation: Secondary | ICD-10-CM | POA: Diagnosis not present

## 2014-09-11 HISTORY — PX: CATARACT EXTRACTION W/PHACO: SHX586

## 2014-09-11 SURGERY — PHACOEMULSIFICATION, CATARACT, WITH IOL INSERTION
Anesthesia: Monitor Anesthesia Care | Site: Eye | Laterality: Right

## 2014-09-11 MED ORDER — LIDOCAINE HCL (PF) 1 % IJ SOLN
INTRAMUSCULAR | Status: AC
  Filled 2014-09-11: qty 2

## 2014-09-11 MED ORDER — NEOMYCIN-POLYMYXIN-DEXAMETH 3.5-10000-0.1 OP SUSP
OPHTHALMIC | Status: DC | PRN
  Administered 2014-09-11: 1 [drp] via OPHTHALMIC

## 2014-09-11 MED ORDER — BSS IO SOLN
INTRAOCULAR | Status: DC | PRN
  Administered 2014-09-11: 500 mL

## 2014-09-11 MED ORDER — FENTANYL CITRATE 0.05 MG/ML IJ SOLN
25.0000 ug | INTRAMUSCULAR | Status: AC
  Administered 2014-09-11 (×2): 25 ug via INTRAVENOUS

## 2014-09-11 MED ORDER — LIDOCAINE HCL 3.5 % OP GEL
1.0000 "application " | Freq: Once | OPHTHALMIC | Status: AC
  Administered 2014-09-11: 1 via OPHTHALMIC

## 2014-09-11 MED ORDER — LACTATED RINGERS IV SOLN
INTRAVENOUS | Status: DC
  Administered 2014-09-11: 09:00:00 via INTRAVENOUS

## 2014-09-11 MED ORDER — PHENYLEPHRINE HCL 2.5 % OP SOLN
1.0000 [drp] | OPHTHALMIC | Status: AC
  Administered 2014-09-11 (×3): 1 [drp] via OPHTHALMIC

## 2014-09-11 MED ORDER — PROVISC 10 MG/ML IO SOLN
INTRAOCULAR | Status: DC | PRN
  Administered 2014-09-11: 0.85 mL via INTRAOCULAR

## 2014-09-11 MED ORDER — POVIDONE-IODINE 5 % OP SOLN
OPHTHALMIC | Status: DC | PRN
  Administered 2014-09-11: 1 via OPHTHALMIC

## 2014-09-11 MED ORDER — LIDOCAINE HCL (PF) 1 % IJ SOLN
INTRAMUSCULAR | Status: DC | PRN
  Administered 2014-09-11: .6 mL

## 2014-09-11 MED ORDER — CYCLOPENTOLATE-PHENYLEPHRINE 0.2-1 % OP SOLN
1.0000 [drp] | OPHTHALMIC | Status: AC
  Administered 2014-09-11 (×3): 1 [drp] via OPHTHALMIC

## 2014-09-11 MED ORDER — MIDAZOLAM HCL 2 MG/2ML IJ SOLN
1.0000 mg | INTRAMUSCULAR | Status: DC | PRN
  Administered 2014-09-11: 2 mg via INTRAVENOUS

## 2014-09-11 MED ORDER — EPINEPHRINE HCL 1 MG/ML IJ SOLN
INTRAMUSCULAR | Status: AC
  Filled 2014-09-11: qty 1

## 2014-09-11 MED ORDER — BSS IO SOLN
INTRAOCULAR | Status: DC | PRN
  Administered 2014-09-11: 15 mL

## 2014-09-11 MED ORDER — FENTANYL CITRATE 0.05 MG/ML IJ SOLN
INTRAMUSCULAR | Status: AC
  Filled 2014-09-11: qty 2

## 2014-09-11 MED ORDER — TETRACAINE HCL 0.5 % OP SOLN
1.0000 [drp] | OPHTHALMIC | Status: AC
  Administered 2014-09-11 (×3): 1 [drp] via OPHTHALMIC

## 2014-09-11 MED ORDER — MIDAZOLAM HCL 2 MG/2ML IJ SOLN
INTRAMUSCULAR | Status: AC
  Filled 2014-09-11: qty 2

## 2014-09-11 SURGICAL SUPPLY — 11 items

## 2014-09-11 NOTE — H&P (Signed)
I have reviewed the H&P, the patient was re-examined, and I have identified no interval changes in medical condition and plan of care since the history and physical of record  

## 2014-09-11 NOTE — Discharge Instructions (Signed)

## 2014-09-11 NOTE — Op Note (Signed)
Date of Admission: 09/11/2014  Date of Surgery: 09/11/2014   Pre-Op Dx: Cataract Right Eye  Post-Op Dx: Senile Nuclear Cataract Right  Eye,  Dx Code H25.11  Surgeon: Tonny Branch, M.D.  Assistants: None  Anesthesia: Topical with MAC  Indications: Painless, progressive loss of vision with compromise of daily activities.  Surgery: Cataract Extraction with Intraocular lens Implant Right Eye  Discription: The patient had dilating drops and viscous lidocaine placed into the Right eye in the pre-op holding area. After transfer to the operating room, a time out was performed. The patient was then prepped and draped. Beginning with a 65 degree blade a paracentesis port was made at the surgeon's 2 o'clock position. The anterior chamber was then filled with 1% non-preserved lidocaine. This was followed by filling the anterior chamber with Provisc.  A 2.99mm keratome blade was used to make a clear corneal incision at the temporal limbus.  A bent cystatome needle was used to create a continuous tear capsulotomy. Hydrodissection was performed with balanced salt solution on a Fine canula. The lens nucleus was then removed using the phacoemulsification handpiece. Residual cortex was removed with the I&A handpiece. The anterior chamber and capsular bag were refilled with Provisc. A posterior chamber intraocular lens was placed into the capsular bag with it's injector. The implant was positioned with the Kuglan hook. The Provisc was then removed from the anterior chamber and capsular bag with the I&A handpiece. Stromal hydration of the main incision and paracentesis port was performed with BSS on a Fine canula. The wounds were tested for leak which was negative. The patient tolerated the procedure well. There were no operative complications. The patient was then transferred to the recovery room in stable condition.  Complications: None  Specimen: None  EBL: None  Prosthetic device: Hoya iSert 250, power 18.0 D,  SN [id number].

## 2014-09-11 NOTE — Anesthesia Postprocedure Evaluation (Signed)
  Anesthesia Post-op Note  Patient: Melinda Collins  Procedure(s) Performed: Procedure(s) with comments: CATARACT EXTRACTION PHACO AND INTRAOCULAR LENS PLACEMENT (IOC) (Right) - CDE:8.11  Patient Location: Short Stay  Anesthesia Type:MAC  Level of Consciousness: awake, alert , oriented and patient cooperative  Airway and Oxygen Therapy: Patient Spontanous Breathing  Post-op Pain: none  Post-op Assessment: Post-op Vital signs reviewed and Patient's Cardiovascular Status Stable  Post-op Vital Signs: Reviewed and stable  Last Vitals:  Filed Vitals:   09/11/14 0845  BP: 114/53  Pulse:   Temp:   Resp: 25    Complications: No apparent anesthesia complications

## 2014-09-11 NOTE — Anesthesia Preprocedure Evaluation (Signed)
Anesthesia Evaluation  Patient identified by MRN, date of birth, ID band Patient awake    Reviewed: Allergy & Precautions, NPO status , Patient's Chart, lab work & pertinent test results  Airway Mallampati: II  TM Distance: >3 FB     Dental  (+) Teeth Intact   Pulmonary COPD COPD inhaler, former smoker,  breath sounds clear to auscultation        Cardiovascular hypertension, + dysrhythmias Atrial Fibrillation Rhythm:Regular Rate:Normal     Neuro/Psych PSYCHIATRIC DISORDERS Depression    GI/Hepatic   Endo/Other    Renal/GU Renal InsufficiencyRenal disease     Musculoskeletal  (+) Arthritis -,   Abdominal   Peds  Hematology   Anesthesia Other Findings   Reproductive/Obstetrics                             Anesthesia Physical Anesthesia Plan  ASA: III  Anesthesia Plan: MAC   Post-op Pain Management:    Induction: Intravenous  Airway Management Planned: Nasal Cannula  Additional Equipment:   Intra-op Plan:   Post-operative Plan:   Informed Consent: I have reviewed the patients History and Physical, chart, labs and discussed the procedure including the risks, benefits and alternatives for the proposed anesthesia with the patient or authorized representative who has indicated his/her understanding and acceptance.     Plan Discussed with:   Anesthesia Plan Comments:         Anesthesia Quick Evaluation

## 2014-09-11 NOTE — Transfer of Care (Signed)
Immediate Anesthesia Transfer of Care Note  Patient: Melinda Collins  Procedure(s) Performed: Procedure(s) with comments: CATARACT EXTRACTION PHACO AND INTRAOCULAR LENS PLACEMENT (IOC) (Right) - CDE:8.11  Patient Location: Short Stay  Anesthesia Type:MAC  Level of Consciousness: awake, alert , oriented and patient cooperative  Airway & Oxygen Therapy: Patient Spontanous Breathing  Post-op Assessment: Report given to RN and Post -op Vital signs reviewed and stable  Post vital signs: Reviewed and stable  Last Vitals:  Filed Vitals:   09/11/14 0845  BP: 114/53  Pulse:   Temp:   Resp: 25    Complications: No apparent anesthesia complications

## 2014-09-12 ENCOUNTER — Encounter (HOSPITAL_COMMUNITY): Payer: Self-pay | Admitting: Ophthalmology

## 2014-09-22 ENCOUNTER — Ambulatory Visit (INDEPENDENT_AMBULATORY_CARE_PROVIDER_SITE_OTHER): Payer: Medicare Other | Admitting: Pharmacist

## 2014-09-22 DIAGNOSIS — I4891 Unspecified atrial fibrillation: Secondary | ICD-10-CM | POA: Diagnosis not present

## 2014-09-22 LAB — POCT INR: INR: 2.3

## 2014-09-22 NOTE — Patient Instructions (Signed)
Anticoagulation Dose Instructions as of 09/22/2014      Sun Mon Tue Wed Thu Fri Sat   New Dose 7.5 mg 10 mg 7.5 mg 10 mg 7.5 mg 10 mg 7.5 mg    Description        Continue current warfarin dose of 1 and 1/2 tablet on sundays, tuesdays, thursdays and saturdays.  Take 2 tablets mondays, wednesdays and fridays.      INR was 2.3 today.

## 2014-10-04 ENCOUNTER — Other Ambulatory Visit: Payer: Self-pay | Admitting: Family Medicine

## 2014-10-07 DIAGNOSIS — I808 Phlebitis and thrombophlebitis of other sites: Secondary | ICD-10-CM | POA: Diagnosis not present

## 2014-10-07 DIAGNOSIS — M4716 Other spondylosis with myelopathy, lumbar region: Secondary | ICD-10-CM | POA: Diagnosis not present

## 2014-10-15 ENCOUNTER — Telehealth: Payer: Self-pay | Admitting: Pharmacist

## 2014-10-15 NOTE — Telephone Encounter (Signed)
Patient was suppose to have INR 10/13/14 but missed appt.  Her duaghter in law checked at home and INR was 3.5.  Patient wanted to know what to do.  I recommended hold today's warfarin dose.  She has appt to recheck tomorrow.

## 2014-10-16 ENCOUNTER — Ambulatory Visit (INDEPENDENT_AMBULATORY_CARE_PROVIDER_SITE_OTHER): Payer: Medicare Other | Admitting: Pharmacist

## 2014-10-16 ENCOUNTER — Other Ambulatory Visit: Payer: Self-pay | Admitting: Family Medicine

## 2014-10-16 DIAGNOSIS — I482 Chronic atrial fibrillation: Secondary | ICD-10-CM

## 2014-10-16 DIAGNOSIS — I4821 Permanent atrial fibrillation: Secondary | ICD-10-CM

## 2014-10-16 LAB — POCT INR: INR: 2.3

## 2014-10-16 NOTE — Patient Instructions (Signed)
Anticoagulation Dose Instructions as of 10/16/2014      Dorene Grebe Tue Wed Thu Fri Sat   New Dose 7.5 mg 10 mg 7.5 mg 7.5 mg 7.5 mg 10 mg 7.5 mg    Description        Change warfarin dose to 1 and 1/2 tablet on sundays, tuesdays, wednesdays, thursdays and saturdays.  Take 2 tablets mondays,  and fridays.       INR was 2.3 today

## 2014-10-20 ENCOUNTER — Ambulatory Visit (INDEPENDENT_AMBULATORY_CARE_PROVIDER_SITE_OTHER): Payer: Medicare Other

## 2014-10-20 ENCOUNTER — Encounter: Payer: Self-pay | Admitting: Family Medicine

## 2014-10-20 ENCOUNTER — Ambulatory Visit (INDEPENDENT_AMBULATORY_CARE_PROVIDER_SITE_OTHER): Payer: Medicare Other | Admitting: Family Medicine

## 2014-10-20 VITALS — BP 124/78 | HR 89 | Temp 97.0°F | Ht 64.0 in | Wt 167.0 lb

## 2014-10-20 DIAGNOSIS — F329 Major depressive disorder, single episode, unspecified: Secondary | ICD-10-CM | POA: Diagnosis not present

## 2014-10-20 DIAGNOSIS — G8929 Other chronic pain: Secondary | ICD-10-CM | POA: Diagnosis not present

## 2014-10-20 DIAGNOSIS — M545 Low back pain: Secondary | ICD-10-CM

## 2014-10-20 DIAGNOSIS — M7731 Calcaneal spur, right foot: Secondary | ICD-10-CM

## 2014-10-20 DIAGNOSIS — M79671 Pain in right foot: Secondary | ICD-10-CM

## 2014-10-20 DIAGNOSIS — F32A Depression, unspecified: Secondary | ICD-10-CM

## 2014-10-20 DIAGNOSIS — I482 Chronic atrial fibrillation, unspecified: Secondary | ICD-10-CM

## 2014-10-20 DIAGNOSIS — M79672 Pain in left foot: Secondary | ICD-10-CM

## 2014-10-20 DIAGNOSIS — J439 Emphysema, unspecified: Secondary | ICD-10-CM | POA: Diagnosis not present

## 2014-10-20 LAB — POCT INR: INR: 2.4

## 2014-10-20 MED ORDER — TORSEMIDE 20 MG PO TABS: ORAL_TABLET | ORAL | Status: DC

## 2014-10-20 MED ORDER — ALBUTEROL SULFATE (2.5 MG/3ML) 0.083% IN NEBU: 2.5000 mg | INHALATION_SOLUTION | Freq: Four times a day (QID) | RESPIRATORY_TRACT | Status: DC | PRN

## 2014-10-20 MED ORDER — DULOXETINE HCL 30 MG PO CPEP: 30.0000 mg | ORAL_CAPSULE | Freq: Every day | ORAL | Status: DC

## 2014-10-20 NOTE — Progress Notes (Signed)
Subjective:    Patient ID: Melinda Collins, female    DOB: Mar 30, 1941, 74 y.o.   MRN: [id number]  HPI Patient here today for bilateral heel pain. She states that the right is worse than the left. No known injury has occurred. The patient is still having chronic back pain and feels like it is no better despite the surgeries that she has had. She is also complaining of being depressed. She feels like she cannot be active because of her chronic back pain.  Patient Active Problem List   Diagnosis Date Noted  . Permanent atrial fibrillation 10/16/2014  . Metabolic syndrome [id number]  . History of uterine cancer 12/03/2013  . Chronic low back pain 05/23/2013  . High risk medication use 05/23/2013  . Swelling of limb 05/03/2013  . Hypertension 05/17/2011  . HYPERLIPIDEMIA 02/10/2009  . TOBACCO ABUSE 02/10/2009  . 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 Atrial fibrillation 02/10/2009   Outpatient Encounter Prescriptions as of 10/20/2014  Medication Sig  . albuterol (PROVENTIL) (2.5 MG/3ML) 0.083% nebulizer solution Take 3 mLs (2.5 mg total) by nebulization every 6 (six) hours as needed for wheezing or shortness of breath.  Marland Kitchen amLODipine (NORVASC) 5 MG tablet TAKE ONE TABLET BY MOUTH ONCE DAILY  . atorvastatin (LIPITOR) 80 MG tablet TAKE ONE TABLET BY MOUTH DAILY  . azelastine (ASTELIN) 0.1 % nasal spray USE ONE SPRAY IN EACH NOSTRIL TWICE DAILY  . BESIVANCE 0.6 % SUSP   . budesonide (PULMICORT) 0.5 MG/2ML nebulizer solution Take 2 mLs (0.5 mg total) by nebulization 2 (two) times daily.  . DUREZOL 0.05 % EMUL   . fluticasone (FLONASE) 50 MCG/ACT nasal spray USE 2 SPRAYS IN EACH NOSTRIL ONCE DAILY.  SHAKE GENTLY BEFORE USING.  . Multiple Vitamins-Minerals (CENTRUM SILVER PO) Take 1 tablet by mouth daily.    . nicotine (EQL NICOTINE) 21 mg/24hr patch Place 1 patch (21 mg total) onto the skin daily.  . NON FORMULARY Oxygen 3L/min   . oxyCODONE-acetaminophen (PERCOCET) 5-325 MG per tablet Take  2 tablets by mouth every 8 (eight) hours as needed.    Marland Kitchen PARoxetine (PAXIL) 40 MG tablet TAKE ONE TABLET BY MOUTH DAILY.  Marland Kitchen PROAIR HFA 108 (90 BASE) MCG/ACT inhaler INHALE ONE PUFF INTO THE LUNGS FOUR TIMES DAILY AS NEEDED FOR WHEEZING  . PROLENSA 0.07 % SOLN   . torsemide (DEMADEX) 20 MG tablet TAKE 1 AND 1/2 TABLETS BY MOUTH ONCE DAILY.  Marland Kitchen warfarin (COUMADIN) 5 MG tablet Take 7.5-10 mg by mouth daily at 6 PM.    Review of Systems  Constitutional: Negative.   HENT: Negative.   Eyes: Negative.   Respiratory: Negative.   Cardiovascular: Negative.   Gastrointestinal: Negative.   Endocrine: Negative.   Genitourinary: Negative.   Musculoskeletal: Positive for arthralgias (bilateral heel pain --- right is worse than left).  Skin: Negative.   Allergic/Immunologic: Negative.   Neurological: Negative.   Hematological: Negative.   Psychiatric/Behavioral: Negative.        Objective:   Physical Exam  Constitutional: She is oriented to person, place, and time. She appears well-developed and well-nourished. She appears distressed.  HENT:  Head: Normocephalic and atraumatic.  Right Ear: External ear normal.  Left Ear: External ear normal.  Nose: Nose normal.  Mouth/Throat: Oropharynx is clear and moist.  Eyes: Conjunctivae and EOM are normal. Pupils are equal, round, and reactive to light. Right eye exhibits no discharge. Left eye exhibits no discharge. No scleral icterus.  Neck: Normal range of motion. Neck supple.  No thyromegaly present.  No carotid bruits  Cardiovascular: Normal rate, regular rhythm, normal heart sounds and intact distal pulses.  Exam reveals no gallop and no friction rub.   No murmur heard. Heart was slightly more regular today than usual at 72/m  Pulmonary/Chest: Effort normal. No respiratory distress. She has no wheezes. She has no rales. She exhibits no tenderness.  Diminished breath sounds bilaterally and tight cough bilaterally  Abdominal: Soft. Bowel sounds are  normal. She exhibits distension. She exhibits no mass. There is no tenderness. There is no rebound and no guarding.  A lot of gaseous distention with decreased bowel sounds  Musculoskeletal: She exhibits no edema or tenderness.  Movement is hesitant especially with sitting up and lying down because of severe back pain.  Lymphadenopathy:    She has no cervical adenopathy.  Neurological: She is alert and oriented to person, place, and time. She has normal reflexes. No cranial nerve deficit.  Skin: Skin is warm and dry. No rash noted.  Psychiatric: Her behavior is normal. Judgment and thought content normal.  Somewhat depressed affect  Nursing note and vitals reviewed.  BP 124/78 mmHg  Pulse 89  Temp(Src) 97 F (36.1 C) (Oral)  Ht 5\' 4"  (1.626 m)  Wt 167 lb (75.751 kg)  BMI 28.65 kg/m2        Assessment & Plan:  1. Heel pain, bilateral -Continue with heel cups and moist heat and consider referral to Dr. Doran Durand and a couple weeks if there's no improvement - DG Foot Complete Left; Future - DG Foot Complete Right; Future  2. A-fib -Continue regular follow-up with cardiology and with Coumadin - POCT INR  3. Heel spur, right -Continue heel cups bilaterally and moist heat with referral to specialist and a couple weeks if no improvement  4. Depression -We will change the Paxil by decreasing this gradually and add Cymbalta  5. Pulmonary emphysema, unspecified emphysema type -Continue with long-acting beta agonist and when necessary albuterol  6. Chronic low back pain -Continue follow-up with neurosurgeon  Meds ordered this encounter  Medications  . albuterol (PROVENTIL) (2.5 MG/3ML) 0.083% nebulizer solution    Sig: Take 3 mLs (2.5 mg total) by nebulization every 6 (six) hours as needed for wheezing or shortness of breath.    Dispense:  150 mL    Refill:  11  . torsemide (DEMADEX) 20 MG tablet    Sig: TAKE 1 AND 1/2 TABLETS BY MOUTH ONCE DAILY.    Dispense:  45 tablet     Refill:  6  . DULoxetine (CYMBALTA) 30 MG capsule    Sig: Take 1 capsule (30 mg total) by mouth daily.    Dispense:  30 capsule    Refill:  1   Patient Instructions   Anticoagulation Dose Instructions as of 10/20/2014      Sun Mon Tue Wed Thu Fri Sat   New Dose 7.5 mg 10 mg 7.5 mg 7.5 mg 7.5 mg 10 mg 7.5 mg    Description        Continue warfarin dose to 1 and 1/2 tablet on sundays, tuesdays, wednesdays, thursdays and saturdays.  Take 2 tablets mondays and fridays.       INR was 2.4 today  Try to exercise if it's nothing more than walking. Even a stationary bike. More activity will be good for you both physically and mentally. Remember to use the rescue inhaler only if needed and we will refill this today. Continue your regular inhaler rinsing her  mouth out after using it Continue to drink plenty of fluids If the heel pain continues despite using the heel cups and using warm wet compresses in a couple of weeks we will ask you to go see an orthopedic surgeon, Dr. Doran Durand. Reduce the Paxil from 40-20 mg for the next 2 weeks and start Cymbalta 30 one daily at least for the next 2 weeks. After 2 weeks we will most likely increase the Cymbalta and discontinue with the Paxil altogether.   Arrie Senate MD

## 2014-10-20 NOTE — Patient Instructions (Addendum)
Anticoagulation Dose Instructions as of 10/20/2014      Dorene Grebe Tue Wed Thu Fri Sat   New Dose 7.5 mg 10 mg 7.5 mg 7.5 mg 7.5 mg 10 mg 7.5 mg    Description        Continue warfarin dose to 1 and 1/2 tablet on sundays, tuesdays, wednesdays, thursdays and saturdays.  Take 2 tablets mondays and fridays.       INR was 2.4 today  Try to exercise if it's nothing more than walking. Even a stationary bike. More activity will be good for you both physically and mentally. Remember to use the rescue inhaler only if needed and we will refill this today. Continue your regular inhaler rinsing her mouth out after using it Continue to drink plenty of fluids If the heel pain continues despite using the heel cups and using warm wet compresses in a couple of weeks we will ask you to go see an orthopedic surgeon, Dr. Doran Durand. Reduce the Paxil from 40-20 mg for the next 2 weeks and start Cymbalta 30 one daily at least for the next 2 weeks. After 2 weeks we will most likely increase the Cymbalta and discontinue with the Paxil altogether.

## 2014-10-21 ENCOUNTER — Other Ambulatory Visit: Payer: Self-pay | Admitting: Pharmacist

## 2014-11-03 ENCOUNTER — Ambulatory Visit (INDEPENDENT_AMBULATORY_CARE_PROVIDER_SITE_OTHER): Payer: Medicare Other | Admitting: Pharmacist

## 2014-11-03 ENCOUNTER — Other Ambulatory Visit: Payer: Self-pay | Admitting: *Deleted

## 2014-11-03 DIAGNOSIS — M79673 Pain in unspecified foot: Secondary | ICD-10-CM

## 2014-11-03 DIAGNOSIS — I4891 Unspecified atrial fibrillation: Secondary | ICD-10-CM

## 2014-11-03 DIAGNOSIS — M25579 Pain in unspecified ankle and joints of unspecified foot: Secondary | ICD-10-CM

## 2014-11-03 DIAGNOSIS — I482 Chronic atrial fibrillation: Secondary | ICD-10-CM | POA: Diagnosis not present

## 2014-11-03 DIAGNOSIS — I4821 Permanent atrial fibrillation: Secondary | ICD-10-CM

## 2014-11-03 LAB — POCT INR: INR: 2.5

## 2014-11-03 MED ORDER — COUMADIN 5 MG PO TABS: ORAL_TABLET | ORAL | Status: DC

## 2014-11-03 NOTE — Patient Instructions (Signed)
Anticoagulation Dose Instructions as of 11/03/2014      Dorene Grebe Tue Wed Thu Fri Sat   New Dose 7.5 mg 10 mg 7.5 mg 7.5 mg 7.5 mg 10 mg 7.5 mg    Description        Continue warfarin dose to 1 and 1/2 tablet on sundays, tuesdays, wednesdays, thursdays and saturdays.  Take 2 tablets mondays and fridays.

## 2014-11-06 ENCOUNTER — Telehealth: Payer: Self-pay | Admitting: Family Medicine

## 2014-11-13 ENCOUNTER — Telehealth: Payer: Self-pay | Admitting: *Deleted

## 2014-11-13 NOTE — Telephone Encounter (Signed)
Please have Tammy review this and make sure there is no problems and can come from taking 30 of Cymbalta and 40 of Paxil

## 2014-11-13 NOTE — Telephone Encounter (Signed)
Pt is on a whole 40 mg paxil and a whole 30 mg Cymbalta.  She may have gotten confused about the transition/ change of meds.  She says taking it this way - Both pills --- she feels the best she has in a long time.  Should she continue it this way or what?

## 2014-11-13 NOTE — Telephone Encounter (Signed)
Left message that referral information was sent to [place]. I do not see that an appointment has been scheduled. Provided direct contact number for referral department.   She may call back if she still has questions about her medications.

## 2014-11-13 NOTE — Telephone Encounter (Signed)
Please call in regards to antidepressant

## 2014-11-14 NOTE — Telephone Encounter (Signed)
Hard to tell if improvement is from combination or starting cymbalta.  I would recommend trying to decrease to 1/2 paxil and eventually taper off if possible.  Though if she needs to go back to 1 tablet of paxil 40mg  it would be OK but would need to monitor for s/s of serotonin syndrome - muscle twitching, excessive sweating, nausea / vomiting or tremor.

## 2014-11-14 NOTE — Telephone Encounter (Signed)
Pt aware to cut paxil in 1/2 . - she will let us know who she does.

## 2014-11-24 ENCOUNTER — Ambulatory Visit (INDEPENDENT_AMBULATORY_CARE_PROVIDER_SITE_OTHER): Payer: Medicare Other | Admitting: Pharmacist

## 2014-11-24 DIAGNOSIS — I4891 Unspecified atrial fibrillation: Secondary | ICD-10-CM

## 2014-11-24 DIAGNOSIS — I482 Chronic atrial fibrillation: Secondary | ICD-10-CM

## 2014-11-24 DIAGNOSIS — I4821 Permanent atrial fibrillation: Secondary | ICD-10-CM

## 2014-11-24 LAB — POCT INR: INR: 2.4

## 2014-11-24 NOTE — Patient Instructions (Signed)
Anticoagulation Dose Instructions as of 11/24/2014      Melinda Collins Tue Wed Thu Fri Sat   New Dose 7.5 mg 10 mg 7.5 mg 7.5 mg 7.5 mg 10 mg 7.5 mg    Description        Continue warfarin dose to 1 and 1/2 tablet on sundays, tuesdays, wednesdays, thursdays and saturdays.  Take 2 tablets mondays and fridays.       INR was 2.4 today

## 2014-12-02 ENCOUNTER — Other Ambulatory Visit: Payer: Self-pay | Admitting: Family Medicine

## 2014-12-02 DIAGNOSIS — M7731 Calcaneal spur, right foot: Secondary | ICD-10-CM | POA: Diagnosis not present

## 2014-12-02 DIAGNOSIS — M7732 Calcaneal spur, left foot: Secondary | ICD-10-CM | POA: Diagnosis not present

## 2014-12-02 DIAGNOSIS — M722 Plantar fascial fibromatosis: Secondary | ICD-10-CM | POA: Diagnosis not present

## 2014-12-11 ENCOUNTER — Ambulatory Visit (INDEPENDENT_AMBULATORY_CARE_PROVIDER_SITE_OTHER): Payer: Medicare Other | Admitting: Pharmacist

## 2014-12-11 DIAGNOSIS — I4891 Unspecified atrial fibrillation: Secondary | ICD-10-CM | POA: Diagnosis not present

## 2014-12-11 LAB — POCT INR: INR: 2.9

## 2014-12-11 MED ORDER — DULOXETINE HCL 60 MG PO CPEP: 60.0000 mg | ORAL_CAPSULE | Freq: Every day | ORAL | Status: DC

## 2014-12-11 NOTE — Patient Instructions (Signed)
Anticoagulation Dose Instructions as of 12/11/2014      Sun Mon Tue Wed Thu Fri Sat   New Dose 7.5 mg 10 mg 7.5 mg 7.5 mg 7.5 mg 10 mg 7.5 mg    Description        Continue warfarin dose to 1 and 1/2 tablet on sundays, tuesdays, wednesdays, thursdays and saturdays.  Take 2 tablets mondays and fridays.      INR was 2.9 today  Increase cymbalta / duloxetine to 60mg  - take 2 tablets of 30mg  until finished; then start 60mg  once day.   Tried using allergy medication such as loratadine or cetrizine (available over the counter) for itching eye / allergy.

## 2014-12-18 ENCOUNTER — Ambulatory Visit: Payer: Medicare Other | Admitting: Family Medicine

## 2014-12-24 ENCOUNTER — Ambulatory Visit: Payer: Medicare Other | Admitting: Family Medicine

## 2015-01-02 ENCOUNTER — Encounter: Payer: Self-pay | Admitting: Family Medicine

## 2015-01-02 ENCOUNTER — Ambulatory Visit (INDEPENDENT_AMBULATORY_CARE_PROVIDER_SITE_OTHER): Payer: Medicare Other | Admitting: Family Medicine

## 2015-01-02 ENCOUNTER — Ambulatory Visit (INDEPENDENT_AMBULATORY_CARE_PROVIDER_SITE_OTHER): Payer: Medicare Other

## 2015-01-02 VITALS — BP 119/76 | HR 98 | Temp 96.9°F | Ht 64.0 in | Wt 166.0 lb

## 2015-01-02 DIAGNOSIS — I10 Essential (primary) hypertension: Secondary | ICD-10-CM

## 2015-01-02 DIAGNOSIS — E559 Vitamin D deficiency, unspecified: Secondary | ICD-10-CM | POA: Diagnosis not present

## 2015-01-02 DIAGNOSIS — M25571 Pain in right ankle and joints of right foot: Secondary | ICD-10-CM | POA: Diagnosis not present

## 2015-01-02 DIAGNOSIS — I482 Chronic atrial fibrillation, unspecified: Secondary | ICD-10-CM

## 2015-01-02 DIAGNOSIS — F329 Major depressive disorder, single episode, unspecified: Secondary | ICD-10-CM

## 2015-01-02 DIAGNOSIS — E8881 Metabolic syndrome: Secondary | ICD-10-CM

## 2015-01-02 DIAGNOSIS — F32A Depression, unspecified: Secondary | ICD-10-CM

## 2015-01-02 LAB — POCT CBC
GRANULOCYTE PERCENT: 71.7 % (ref 37–80)
HCT, POC: 46.4 % (ref 37.7–47.9)
Hemoglobin: 14.6 g/dL (ref 12.2–16.2)
Lymph, poc: 1.9 (ref 0.6–3.4)
MCH, POC: 28.6 pg (ref 27–31.2)
MCHC: 31.5 g/dL — AB (ref 31.8–35.4)
MCV: 90.6 fL (ref 80–97)
MPV: 7.6 fL (ref 0–99.8)
PLATELET COUNT, POC: 202 10*3/uL (ref 142–424)
POC Granulocyte: 6.2 (ref 2–6.9)
POC LYMPH PERCENT: 22.1 %L (ref 10–50)
RBC: 5.12 M/uL (ref 4.04–5.48)
RDW, POC: 15.5 %
WBC: 8.7 10*3/uL (ref 4.6–10.2)

## 2015-01-02 LAB — POCT INR: INR: 3

## 2015-01-02 NOTE — Progress Notes (Signed)
Subjective:    Patient ID: Melinda Collins, female    DOB: 11/26/1940, 74 y.o.   MRN: [id number]  HPI Pt here for follow up and management of chronic medical problems which includes hypertension and atrial fibrillation. She is taking medications regularly. The patient recently states that in a hole in her yard and sprained her right ankle.     Patient Active Problem List   Diagnosis Date Noted  . Permanent atrial fibrillation 10/16/2014  . Metabolic syndrome 94/58/5929  . History of uterine cancer 12/03/2013  . Chronic low back pain 05/23/2013  . High risk medication use 05/23/2013  . Swelling of limb 05/03/2013  . Hypertension 05/17/2011  . HYPERLIPIDEMIA 02/10/2009  . TOBACCO ABUSE 02/10/2009  . 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 Atrial fibrillation 02/10/2009   Outpatient Encounter Prescriptions as of 01/02/2015  Medication Sig  . albuterol (PROVENTIL) (2.5 MG/3ML) 0.083% nebulizer solution Take 3 mLs (2.5 mg total) by nebulization every 6 (six) hours as needed for wheezing or shortness of breath.  Marland Kitchen amLODipine (NORVASC) 5 MG tablet TAKE ONE TABLET BY MOUTH ONCE DAILY  . atorvastatin (LIPITOR) 80 MG tablet TAKE ONE TABLET BY MOUTH DAILY  . azelastine (ASTELIN) 0.1 % nasal spray USE ONE SPRAY IN EACH NOSTRIL TWICE DAILY  . BESIVANCE 0.6 % SUSP   . budesonide (PULMICORT) 0.5 MG/2ML nebulizer solution Take 2 mLs (0.5 mg total) by nebulization 2 (two) times daily.  Marland Kitchen COUMADIN 5 MG tablet TAKE 1 TO 2 TABLETS BY MOUTH AS DIRECTED BY ANTICOAGULATION CLINIC  . DULoxetine (CYMBALTA) 60 MG capsule Take 1 capsule (60 mg total) by mouth daily.  . DUREZOL 0.05 % EMUL   . fluticasone (FLONASE) 50 MCG/ACT nasal spray USE 2 SPRAYS IN EACH NOSTRIL ONCE DAILY.  SHAKE GENTLY BEFORE USING.  . Multiple Vitamins-Minerals (CENTRUM SILVER PO) Take 1 tablet by mouth daily.    . nicotine (EQL NICOTINE) 21 mg/24hr patch Place 1 patch (21 mg total) onto the skin daily.  . NON FORMULARY Oxygen  3L/min   . oxyCODONE-acetaminophen (PERCOCET) 5-325 MG per tablet Take 2 tablets by mouth every 8 (eight) hours as needed.    Marland Kitchen PARoxetine (PAXIL) 40 MG tablet TAKE ONE TABLET BY MOUTH DAILY (Patient taking differently: TAKE ONE-half TABLET BY MOUTH DAILY)  . PROAIR HFA 108 (90 BASE) MCG/ACT inhaler   . PROLENSA 0.07 % SOLN   . torsemide (DEMADEX) 20 MG tablet TAKE 1 AND 1/2 TABLETS BY MOUTH ONCE DAILY.   No facility-administered encounter medications on file as of 01/02/2015.      Review of Systems  Constitutional: Negative.   HENT: Negative.   Eyes: Negative.   Respiratory: Negative.   Cardiovascular: Negative.   Gastrointestinal: Negative.   Endocrine: Negative.   Genitourinary: Negative.   Musculoskeletal: Positive for arthralgias (right ankle pain from recent fall).  Skin: Negative.   Allergic/Immunologic: Negative.   Neurological: Negative.   Hematological: Negative.   Psychiatric/Behavioral: Negative.        Depression screen - recheck meds       Objective:   Physical Exam  Constitutional: She is oriented to person, place, and time. She appears well-developed and well-nourished. No distress.  HENT:  Head: Normocephalic and atraumatic.  Right Ear: External ear normal.  Left Ear: External ear normal.  Nose: Nose normal.  Mouth/Throat: Oropharynx is clear and moist.  Eyes: Conjunctivae and EOM are normal. Pupils are equal, round, and reactive to light. Right eye exhibits no discharge. Left eye exhibits no  discharge. No scleral icterus.  Neck: Normal range of motion. Neck supple. No thyromegaly present.  No carotid bruits  Cardiovascular: Normal rate and normal heart sounds.   No murmur heard. Distal pulses in the right foot were difficult to palpate due to the swelling but were palpable in the left foot. The patient remains in atrial fibrillation with an irregular irregular rhythm at 96/m  Pulmonary/Chest: Effort normal. No respiratory distress. She has no wheezes. She  has no rales. She exhibits no tenderness.  The patient has a tight congested cough and somewhat diminished breath sounds bilaterally without rales or wheezes  Abdominal: Soft. Bowel sounds are normal. She exhibits no mass. There is no tenderness. There is no rebound and no guarding.  The abdomen was nontender without masses or organ enlargement  Musculoskeletal: Normal range of motion. She exhibits edema. She exhibits no tenderness.  The patient had edema and tenderness in the right ankle with tenderness both at the medial and lateral malleolus. There was slight warmth and pulses were difficult to palpate due to the swelling. The x-ray report was returned showing no fracture  Lymphadenopathy:    She has no cervical adenopathy.  Neurological: She is alert and oriented to person, place, and time. She has normal reflexes. No cranial nerve deficit.  Skin: Skin is warm and dry. No rash noted.  Psychiatric: She has a normal mood and affect. Her behavior is normal. Judgment and thought content normal.  Nursing note and vitals reviewed.   BP 119/76 mmHg  Pulse 98  Temp(Src) 96.9 F (36.1 C) (Oral)  Ht '5\' 4"'  (1.626 m)  Wt 166 lb (75.297 kg)  BMI 28.48 kg/m2       Assessment & Plan:  1. Right ankle pain -The ankle was wrapped with an Ace bandage and the x-ray report was negative for fracture but positive for soft tissue swelling - DG Ankle Complete Right; Future - POCT CBC  2. Essential hypertension -The blood pressure is good today she should continue with her current medication. This includes Demadex. And amlodipine. - POCT CBC - BMP8+EGFR - Hepatic function panel - Lipid panel - Ambulatory referral to Cardiology  3. Metabolic syndrome -Patient drinks a lot of soft drinks and she needs to limit this and drink more water and she is aware of this. - POCT CBC - BMP8+EGFR - Lipid panel  4. Vitamin D deficiency -Continue current vitamin D replacement pending results of lab work this  is most likely in her multivitamin. - POCT CBC - Vit D  25 hydroxy (rtn osteoporosis monitoring)  5. A-fib -She continues to have ongoing chronic atrial fibrillation and she is tolerating this without problems. She does mention that she needs referral back to the cardiologist because it's been almost a year since he has seen her. We will arrange this appointment. - POCT INR - Ambulatory referral to Cardiology  6. Depression -For now we will continue with the Cymbalta 60 mg 1 daily  Patient Instructions                       Medicare Annual Wellness Visit  [facility] and the medical providers at [facility] strive to bring you the best medical care.  In doing so we not only want to address your current medical conditions and concerns but also to detect new conditions early and prevent illness, disease and health-related problems.    Medicare offers a yearly Wellness Visit which allows our clinical  staff to assess your need for preventative services including immunizations, lifestyle education, counseling to decrease risk of preventable diseases and screening for fall risk and other medical concerns.    This visit is provided free of charge (no copay) for all Medicare recipients. The clinical pharmacists at [facility] have begun to conduct these Wellness Visits which will also include a thorough review of all your medications.    As you primary medical provider recommend that you make an appointment for your Annual Wellness Visit if you have not done so already this year.  You may set up this appointment before you leave today or you may call back [phone]) and schedule an appointment.  Please make sure when you call that you mention that you are scheduling your Annual Wellness Visit with the clinical pharmacist so that the appointment may be made for the proper length of time.     Continue current medications. Continue good therapeutic  lifestyle changes which include good diet and exercise. Fall precautions discussed with patient. If an FOBT was given today- please return it to our front desk. If you are over 72 years old - you may need Prevnar 74 or the adult Pneumonia vaccine.  Flu Shots are still available at our office. If you still haven't had one please call to set up a nurse visit to get one.   After your visit with [place] today you will receive a survey in the mail or online from [employer] regarding your care with [place]. Please take a moment to fill this out. Your feedback is very important to [place] as you can help [place] better understand your patient needs as well as improve your experience and satisfaction. WE CARE ABOUT YOU!!!   Repeat pro time in one week and at the same time recheck the right ankle Elevate the ankle as much as possible the next 2-3 days and use ice compresses for another 24 hours and then start using warm wet compresses Use walker as much as possible to keep from falling Take pain medication that you already have at home if needed for ankle pain Use Ace bandage as directed Take Zantac 150 over-the-counter 1 twice daily before breakfast and supper If problem with swallowing progress or get worse we will refer you back to the gastroenterologist. For the depression issues continue with Cymbalta for the time being Follow-up with cardiology and we will call and make sure that he has you on the schedule to see you. Follow-up with the neurosurgeon as planned   Arrie Senate MD

## 2015-01-02 NOTE — Patient Instructions (Addendum)
Medicare Annual Wellness Visit  [facility] and the medical providers at [facility] strive to bring you the best medical care.  In doing so we not only want to address your current medical conditions and concerns but also to detect new conditions early and prevent illness, disease and health-related problems.    Medicare offers a yearly Wellness Visit which allows our clinical staff to assess your need for preventative services including immunizations, lifestyle education, counseling to decrease risk of preventable diseases and screening for fall risk and other medical concerns.    This visit is provided free of charge (no copay) for all Medicare recipients. The clinical pharmacists at [facility] have begun to conduct these Wellness Visits which will also include a thorough review of all your medications.    As you primary medical provider recommend that you make an appointment for your Annual Wellness Visit if you have not done so already this year.  You may set up this appointment before you leave today or you may call back [phone] and schedule an appointment.  Please make sure when you call that you mention that you are scheduling your Annual Wellness Visit with the clinical pharmacist so that the appointment may be made for the proper length of time.     Continue current medications. Continue good therapeutic lifestyle changes which include good diet and exercise. Fall precautions discussed with patient. If an FOBT was given today- please return it to our front desk. If you are over 63 years old - you may need Prevnar 79 or the adult Pneumonia vaccine.  Flu Shots are still available at our office. If you still haven't had one please call to set up a nurse visit to get one.   After your visit with [place] today you will receive a survey in the mail or online from [employer] regarding your care with [place]. Please take a moment to  fill this out. Your feedback is very important to [place] as you can help [place] better understand your patient needs as well as improve your experience and satisfaction. WE CARE ABOUT YOU!!!   Repeat pro time in one week and at the same time recheck the right ankle Elevate the ankle as much as possible the next 2-3 days and use ice compresses for another 24 hours and then start using warm wet compresses Use walker as much as possible to keep from falling Take pain medication that you already have at home if needed for ankle pain Use Ace bandage as directed Take Zantac 150 over-the-counter 1 twice daily before breakfast and supper If problem with swallowing progress or get worse we will refer you back to the gastroenterologist. For the depression issues continue with Cymbalta for the time being Follow-up with cardiology and we will call and make sure that he has you on the schedule to see you. Follow-up with the neurosurgeon as planned

## 2015-01-03 LAB — BMP8+EGFR
BUN / CREAT RATIO: 18 (ref 11–26)
BUN: 25 mg/dL (ref 8–27)
CO2: 23 mmol/L (ref 18–29)
Calcium: 9 mg/dL (ref 8.7–10.3)
Chloride: 103 mmol/L (ref 97–108)
Creatinine, Ser: 1.38 mg/dL — ABNORMAL HIGH (ref 0.57–1.00)
GFR calc non Af Amer: 38 mL/min/{1.73_m2} — ABNORMAL LOW (ref >59)
GFR, EST AFRICAN AMERICAN: 44 mL/min/{1.73_m2} — AB (ref >59)
Glucose: 130 mg/dL — ABNORMAL HIGH (ref 65–99)
Potassium: 3.7 mmol/L (ref 3.5–5.2)
Sodium: 147 mmol/L — ABNORMAL HIGH (ref 134–144)

## 2015-01-03 LAB — LIPID PANEL
CHOL/HDL RATIO: 2.8 ratio (ref 0.0–4.4)
Cholesterol, Total: 148 mg/dL (ref 100–199)
HDL: 53 mg/dL (ref >39)
LDL CALC: 49 mg/dL (ref 0–99)
Triglycerides: 231 mg/dL — ABNORMAL HIGH (ref 0–149)
VLDL CHOLESTEROL CAL: 46 mg/dL — AB (ref 5–40)

## 2015-01-03 LAB — HEPATIC FUNCTION PANEL
ALK PHOS: 101 IU/L (ref 39–117)
ALT: 24 IU/L (ref 0–32)
AST: 23 IU/L (ref 0–40)
Albumin: 3.9 g/dL (ref 3.5–4.8)
BILIRUBIN TOTAL: 0.5 mg/dL (ref 0.0–1.2)
BILIRUBIN, DIRECT: 0.16 mg/dL (ref 0.00–0.40)
TOTAL PROTEIN: 5.8 g/dL — AB (ref 6.0–8.5)

## 2015-01-03 LAB — VITAMIN D 25 HYDROXY (VIT D DEFICIENCY, FRACTURES): VIT D 25 HYDROXY: 29.7 ng/mL — AB (ref 30.0–100.0)

## 2015-01-05 ENCOUNTER — Telehealth: Payer: Self-pay | Admitting: *Deleted

## 2015-01-05 NOTE — Telephone Encounter (Signed)
lmtcb regarding test results. 

## 2015-01-05 NOTE — Telephone Encounter (Signed)
-----   Message from Chipper Herb, MD sent at 01/03/2015 10:01 PM EDT ----- The blood sugar is elevated at 1:30. The creatinine, the most important kidney function test remains elevated but this is lower than it has been in the past and this is good. We will continue to monitor this. The electrolytes including potassium are good except the sodium is minimally elevated. We will continue to monitor this also. Liver function tests are within normal limits except one is slightly decreased. Melinda Collins all numbers with traditional lipid testing have a total LDL C cholesterol at 49 and this is good. The good cholesterol or the HDL is also good and within normal limits. The triglycerides are elevated. If the patient was not fasting this would explain this if the patient was fasting they are too high. Please confirm whether she is fasting or not.------ continue with the atorvastatin and with as aggressive therapeutic lifestyle changes as possible which include diet and exercise The vitamin D level is slightly decreased.----- August the patient has an elevated creatinine we cannot add any additional vitamin D at this time.

## 2015-01-06 DIAGNOSIS — M7732 Calcaneal spur, left foot: Secondary | ICD-10-CM | POA: Diagnosis not present

## 2015-01-13 ENCOUNTER — Telehealth: Payer: Self-pay | Admitting: Family Medicine

## 2015-01-14 NOTE — Telephone Encounter (Signed)
Labs reviewed with patient.

## 2015-01-15 ENCOUNTER — Ambulatory Visit (INDEPENDENT_AMBULATORY_CARE_PROVIDER_SITE_OTHER): Payer: Medicare Other | Admitting: Pharmacist

## 2015-01-15 DIAGNOSIS — I4821 Permanent atrial fibrillation: Secondary | ICD-10-CM

## 2015-01-15 DIAGNOSIS — I482 Chronic atrial fibrillation: Secondary | ICD-10-CM

## 2015-01-15 LAB — POCT INR: INR: 2.3

## 2015-01-15 NOTE — Patient Instructions (Signed)
Anticoagulation Dose Instructions as of 01/15/2015      Melinda Collins Tue Wed Thu Fri Sat   New Dose 7.5 mg 10 mg 7.5 mg 7.5 mg 7.5 mg 10 mg 7.5 mg    Description        Continue warfarin dose to 1 and 1/2 tablet on sundays, tuesdays, wednesdays, thursdays and saturdays.  Take 2 tablets mondays and fridays.      INR was 2.3 today

## 2015-01-16 ENCOUNTER — Other Ambulatory Visit: Payer: Self-pay | Admitting: *Deleted

## 2015-01-16 ENCOUNTER — Telehealth: Payer: Self-pay | Admitting: Family Medicine

## 2015-01-16 DIAGNOSIS — M62838 Other muscle spasm: Secondary | ICD-10-CM

## 2015-01-16 MED ORDER — CYCLOBENZAPRINE HCL 5 MG PO TABS
5.0000 mg | ORAL_TABLET | Freq: Three times a day (TID) | ORAL | Status: DC | PRN
Start: 2015-01-16 — End: 2016-06-07

## 2015-01-16 NOTE — Telephone Encounter (Signed)
Please call in a prescription for Flexeril 5 mg #30 one 3 times a day for muscle relaxation as needed

## 2015-01-16 NOTE — Telephone Encounter (Signed)
Pain for three days in neck and shoulder.  She may have got that from the way she was sleeping in bed.  Has tried OTC rubs and heat on it.  Could you send in something that would work but not make her drowsy?

## 2015-01-20 DIAGNOSIS — I808 Phlebitis and thrombophlebitis of other sites: Secondary | ICD-10-CM | POA: Diagnosis not present

## 2015-01-20 DIAGNOSIS — M4716 Other spondylosis with myelopathy, lumbar region: Secondary | ICD-10-CM | POA: Diagnosis not present

## 2015-01-22 ENCOUNTER — Encounter: Payer: Medicare Other | Admitting: Cardiology

## 2015-01-22 NOTE — Progress Notes (Signed)
Patient ID: Melinda Collins, female   DOB: April 18, 1941, 74 y.o.   MRN: [id number]    ERROR

## 2015-01-30 DIAGNOSIS — H40013 Open angle with borderline findings, low risk, bilateral: Secondary | ICD-10-CM | POA: Diagnosis not present

## 2015-02-04 ENCOUNTER — Other Ambulatory Visit: Payer: Self-pay | Admitting: Family Medicine

## 2015-02-09 ENCOUNTER — Ambulatory Visit (INDEPENDENT_AMBULATORY_CARE_PROVIDER_SITE_OTHER): Payer: Medicare Other | Admitting: Pharmacist

## 2015-02-09 DIAGNOSIS — I4891 Unspecified atrial fibrillation: Secondary | ICD-10-CM

## 2015-02-09 LAB — POCT INR: INR: 2.9

## 2015-02-09 MED ORDER — PAROXETINE HCL 40 MG PO TABS: 40.0000 mg | ORAL_TABLET | Freq: Every day | ORAL | Status: DC

## 2015-02-09 NOTE — Patient Instructions (Signed)
Anticoagulation Dose Instructions as of 02/09/2015      Melinda Collins Tue Wed Thu Fri Sat   New Dose 7.5 mg 10 mg 7.5 mg 7.5 mg 7.5 mg 10 mg 7.5 mg    Description        Continue warfarin dose to 1 and 1/2 tablet on sundays, tuesdays, wednesdays, thursdays and saturdays.  Take 2 tablets mondays and fridays.      INR was 2.9 today.

## 2015-03-02 ENCOUNTER — Ambulatory Visit (INDEPENDENT_AMBULATORY_CARE_PROVIDER_SITE_OTHER): Payer: Medicare Other | Admitting: Pharmacist

## 2015-03-02 DIAGNOSIS — I482 Chronic atrial fibrillation: Secondary | ICD-10-CM

## 2015-03-02 DIAGNOSIS — I4821 Permanent atrial fibrillation: Secondary | ICD-10-CM

## 2015-03-02 LAB — POCT INR: INR: 2.1

## 2015-03-02 NOTE — Patient Instructions (Signed)
Anticoagulation Dose Instructions as of 03/02/2015      Melinda Collins Tue Wed Thu Fri Sat   New Dose 7.5 mg 10 mg 7.5 mg 7.5 mg 7.5 mg 10 mg 7.5 mg    Description        Continue warfarin dose to 1 and 1/2 tablet on sundays, tuesdays, wednesdays, thursdays and saturdays.  Take 2 tablets mondays and fridays.      INR was 2.1 today

## 2015-03-04 ENCOUNTER — Other Ambulatory Visit: Payer: Self-pay | Admitting: Pharmacist

## 2015-03-04 ENCOUNTER — Other Ambulatory Visit: Payer: Self-pay | Admitting: Family Medicine

## 2015-03-11 ENCOUNTER — Ambulatory Visit (INDEPENDENT_AMBULATORY_CARE_PROVIDER_SITE_OTHER): Payer: Medicare Other | Admitting: Pharmacist

## 2015-03-11 DIAGNOSIS — I482 Chronic atrial fibrillation: Secondary | ICD-10-CM

## 2015-03-11 DIAGNOSIS — I4821 Permanent atrial fibrillation: Secondary | ICD-10-CM

## 2015-03-11 LAB — POCT INR: INR: 3

## 2015-03-11 NOTE — Patient Instructions (Signed)
Anticoagulation Dose Instructions as of 03/11/2015      Melinda Collins Tue Wed Thu Fri Sat   New Dose 7.5 mg 10 mg 7.5 mg 7.5 mg 7.5 mg 10 mg 7.5 mg    Description        Continue warfarin dose to 1 and 1/2 tablet on sundays, tuesdays, wednesdays, thursdays and saturdays.  Take 2 tablets mondays and fridays.      INR was 3.0 today

## 2015-03-26 ENCOUNTER — Encounter: Payer: Self-pay | Admitting: Pharmacist

## 2015-04-09 ENCOUNTER — Encounter: Payer: Medicare Other | Admitting: Pharmacist

## 2015-04-16 ENCOUNTER — Ambulatory Visit (INDEPENDENT_AMBULATORY_CARE_PROVIDER_SITE_OTHER): Payer: Medicare Other | Admitting: Pharmacist

## 2015-04-16 DIAGNOSIS — I4891 Unspecified atrial fibrillation: Secondary | ICD-10-CM

## 2015-04-16 LAB — POCT INR: INR: 2.9

## 2015-04-16 NOTE — Patient Instructions (Signed)
Anticoagulation Dose Instructions as of 04/16/2015      Dorene Grebe Tue Wed Thu Fri Sat   New Dose 7.5 mg 10 mg 7.5 mg 7.5 mg 7.5 mg 10 mg 7.5 mg    Description        Continue warfarin dose to 1 and 1/2 tablet on sundays, tuesdays, wednesdays, thursdays and saturdays.  Take 2 tablets mondays and fridays.      INR was 2.9 today

## 2015-04-22 DIAGNOSIS — M4716 Other spondylosis with myelopathy, lumbar region: Secondary | ICD-10-CM | POA: Diagnosis not present

## 2015-04-30 ENCOUNTER — Encounter: Payer: Self-pay | Admitting: Pharmacist

## 2015-05-06 ENCOUNTER — Encounter: Payer: Self-pay | Admitting: Pharmacist

## 2015-05-06 ENCOUNTER — Ambulatory Visit (INDEPENDENT_AMBULATORY_CARE_PROVIDER_SITE_OTHER): Payer: Medicare Other

## 2015-05-06 ENCOUNTER — Encounter: Payer: Self-pay | Admitting: Family Medicine

## 2015-05-06 ENCOUNTER — Ambulatory Visit (INDEPENDENT_AMBULATORY_CARE_PROVIDER_SITE_OTHER): Payer: Medicare Other | Admitting: Family Medicine

## 2015-05-06 VITALS — BP 126/84 | HR 108 | Temp 97.9°F | Ht 64.0 in | Wt 167.0 lb

## 2015-05-06 DIAGNOSIS — M25562 Pain in left knee: Secondary | ICD-10-CM

## 2015-05-06 DIAGNOSIS — I4891 Unspecified atrial fibrillation: Secondary | ICD-10-CM

## 2015-05-06 DIAGNOSIS — M25572 Pain in left ankle and joints of left foot: Secondary | ICD-10-CM

## 2015-05-06 DIAGNOSIS — R609 Edema, unspecified: Secondary | ICD-10-CM

## 2015-05-06 DIAGNOSIS — Z23 Encounter for immunization: Secondary | ICD-10-CM | POA: Diagnosis not present

## 2015-05-06 LAB — POCT INR: INR: 1.9

## 2015-05-06 MED ORDER — VARENICLINE TARTRATE 0.5 MG X 11 & 1 MG X 42 PO MISC: ORAL | Status: DC

## 2015-05-06 NOTE — Patient Instructions (Addendum)
Anticoagulation Dose Instructions as of 05/06/2015      Melinda Collins Tue Wed Thu Fri Sat   New Dose 7.5 mg 10 mg 7.5 mg 7.5 mg 7.5 mg 10 mg 7.5 mg    Description        Take 2 tablets (= 10mg ) of warfarin today - Wednesday, October 19th.  Then continue warfarin dose to 1 and 1/2 tablet on sundays, tuesdays, wednesdays, thursdays and saturdays.  Take 2 tablets mondays and fridays.      INR was 1.9 today  We will arrange to get Dopplers of the left leg for a DVT The patient will get left knee films and left foot films here As she requested medication for smoking cessation we will also call in a prescription for Chantix at her request Take Chantix as directed Elevate legs when sitting until x-ray results are back and until Dopplers have been obtained

## 2015-05-06 NOTE — Progress Notes (Addendum)
Subjective:    Patient ID: Melinda Collins, female    DOB: [birth year], 74 y.o.   MRN: [id number]  HPI Patient here today for left lower extremity swelling and pain. She states she has not had any injury. The patient indicates that she got out of bed 2 weeks ago and felt a severe pain in her left foot and after couple days there was a lot of swelling in the left leg and knee pain also. When she got out of the bed she did not fall. Since that time she's had swelling in the leg with foot pain knee pain. Initially there was bruising and discoloration of the foot. The patient is also requesting medication for smoking cessation. Specifically Chantix.      Patient Active Problem List   Diagnosis Date Noted  . Permanent atrial fibrillation ([place]) 10/16/2014  . Metabolic syndrome [id number]  . History of uterine cancer 12/03/2013  . Chronic low back pain 05/23/2013  . High risk medication use 05/23/2013  . Swelling of limb 05/03/2013  . Hypertension 05/17/2011  . HYPERLIPIDEMIA 02/10/2009  . TOBACCO ABUSE 02/10/2009  . 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 Atrial fibrillation ([place]) 02/10/2009   Outpatient Encounter Prescriptions as of 05/06/2015  Medication Sig  . albuterol (PROVENTIL) (2.5 MG/3ML) 0.083% nebulizer solution Take 3 mLs (2.5 mg total) by nebulization every 6 (six) hours as needed for wheezing or shortness of breath.  Marland Kitchen amLODipine (NORVASC) 5 MG tablet TAKE ONE TABLET BY MOUTH ONCE DAILY  . atorvastatin (LIPITOR) 80 MG tablet TAKE ONE TABLET BY MOUTH DAILY  . azelastine (ASTELIN) 0.1 % nasal spray USE ONE SPRAY IN EACH NOSTRIL TWICE DAILY  . BESIVANCE 0.6 % SUSP   . budesonide (PULMICORT) 0.5 MG/2ML nebulizer solution Take 2 mLs (0.5 mg total) by nebulization 2 (two) times daily.  . cyclobenzaprine (FLEXERIL) 5 MG tablet Take 1 tablet (5 mg total) by mouth 3 (three) times daily as needed for muscle spasms.  . DULoxetine (CYMBALTA) 60 MG capsule Take 1 capsule (60 mg total)  by mouth daily.  . fluticasone (FLONASE) 50 MCG/ACT nasal spray USE 2 SPRAYS IN EACH NOSTRIL ONCE DAILY.  SHAKE GENTLY BEFORE USING.  . Multiple Vitamins-Minerals (CENTRUM SILVER PO) Take 1 tablet by mouth daily.    . NON FORMULARY Oxygen 3L/min   . oxyCODONE-acetaminophen (PERCOCET) 5-325 MG per tablet Take 2 tablets by mouth every 8 (eight) hours as needed.    Marland Kitchen PARoxetine (PAXIL) 40 MG tablet Take 1 tablet (40 mg total) by mouth daily.  Marland Kitchen PROAIR HFA 108 (90 BASE) MCG/ACT inhaler   . torsemide (DEMADEX) 20 MG tablet TAKE 1 AND 1/2 TABLETS BY MOUTH ONCE DAILY.  Marland Kitchen warfarin (COUMADIN) 5 MG tablet TAKE 1 TO 2 TABLETS BY MOUTH AS DIRECTED BY ANTICOAGULATION CLINIC  . nicotine (EQL NICOTINE) 21 mg/24hr patch Place 1 patch (21 mg total) onto the skin daily. (Patient not taking: Reported on 05/06/2015)   No facility-administered encounter medications on file as of 05/06/2015.      Review of Systems  Constitutional: Negative.   HENT: Negative.   Eyes: Negative.   Respiratory: Negative.   Cardiovascular: Positive for leg swelling (left lower extremity pain and swelling).  Gastrointestinal: Negative.   Endocrine: Negative.   Genitourinary: Negative.   Musculoskeletal: Negative.   Skin: Negative.   Allergic/Immunologic: Negative.   Neurological: Negative.   Hematological: Negative.   Psychiatric/Behavioral: Negative.        Objective:   Physical Exam  Constitutional: She  is oriented to person, place, and time. She appears well-developed and well-nourished. No distress.  HENT:  Head: Normocephalic and atraumatic.  Eyes: Conjunctivae and EOM are normal. Pupils are equal, round, and reactive to light. Right eye exhibits no discharge. Left eye exhibits no discharge. No scleral icterus.  Neck: Normal range of motion.  Musculoskeletal: She exhibits edema and tenderness.  The patient is using a rolling walker. She continues to have back pain from her surgery. She has left knee tenderness to  palpation and left foot and ankle tenderness with discoloration of the lateral aspect of the lateral malleolus on the dorsal foot. She has good mobility of the foot and ankle but tender to palpation.  Neurological: She is alert and oriented to person, place, and time.  Skin: Skin is warm and dry. No rash noted. No erythema.  Is some bruising of the dorsal foot but no rubor or erythema.  Psychiatric: She has a normal mood and affect. Her behavior is normal. Judgment and thought content normal.  Nursing note and vitals reviewed.   BP 126/84 mmHg  Pulse 108  Temp(Src) 97.9 F (36.6 C) (Oral)  Ht 5\' 4"  (1.626 m)  Wt 167 lb (75.751 kg)  BMI 28.65 kg/m2  [facility] reading (PRIMARY) by  Dr. Louretta Parma knee and left foot  --- no acute abnormality noted                                   Assessment & Plan:  1. A-fib ([place]) - POCT INR - DG Ankle Complete Left; Future - DG Knee 1-2 Views Left; Future - US Venous Img Lower Unilateral Left; Future  2. Left knee pain -Take Tylenol for pain - DG Knee 1-2 Views Left; Future - US Venous Img Lower Unilateral Left; Future  3. Left ankle pain -Take Tylenol for pain - DG Ankle Complete Left; Future - US Venous Img Lower Unilateral Left; Future  4. Edema left leg -Venous Dopplers for DVT -Elevate legs when possible -Call us within 24 hours of getting the DVT Doppler if we do not call you  Patient Instructions   Anticoagulation Dose Instructions as of 05/06/2015      Sun Mon Tue Wed Thu Fri Sat   New Dose 7.5 mg 10 mg 7.5 mg 7.5 mg 7.5 mg 10 mg 7.5 mg    Description        Take 2 tablets (= 10mg ) of warfarin today - Wednesday, October 19th.  Then continue warfarin dose to 1 and 1/2 tablet on sundays, tuesdays, wednesdays, thursdays and saturdays.  Take 2 tablets mondays and fridays.      INR was 1.9 today  We will arrange to get Dopplers of the left leg for a DVT The patient will get left knee films and left foot films here As she  requested medication for smoking cessation we will also call in a prescription for Chantix at her request   elevate legs when possible Take Tylenol as needed for pain  Arrie Senate MD

## 2015-05-07 ENCOUNTER — Telehealth: Payer: Self-pay | Admitting: Family Medicine

## 2015-05-08 DIAGNOSIS — M25572 Pain in left ankle and joints of left foot: Secondary | ICD-10-CM | POA: Diagnosis not present

## 2015-05-08 DIAGNOSIS — M25562 Pain in left knee: Secondary | ICD-10-CM | POA: Diagnosis not present

## 2015-05-09 ENCOUNTER — Other Ambulatory Visit: Payer: Self-pay | Admitting: Family Medicine

## 2015-05-09 ENCOUNTER — Other Ambulatory Visit: Payer: Self-pay | Admitting: Pharmacist

## 2015-05-12 ENCOUNTER — Telehealth: Payer: Self-pay | Admitting: Family Medicine

## 2015-05-14 ENCOUNTER — Ambulatory Visit (INDEPENDENT_AMBULATORY_CARE_PROVIDER_SITE_OTHER): Payer: Medicare Other | Admitting: Pharmacist

## 2015-05-14 DIAGNOSIS — I4891 Unspecified atrial fibrillation: Secondary | ICD-10-CM

## 2015-05-14 LAB — POCT INR: INR: 2.9

## 2015-05-14 NOTE — Patient Instructions (Signed)
Anticoagulation Dose Instructions as of 05/14/2015      Melinda Collins Tue Wed Thu Fri Sat   New Dose 7.5 mg 10 mg 7.5 mg 7.5 mg 7.5 mg 10 mg 7.5 mg    Description        Continue warfarin dose to 1 and 1/2 tablet on sundays, tuesdays, wednesdays, thursdays and saturdays.  Take 2 tablets mondays and fridays.      INR was 2.9 today

## 2015-05-18 ENCOUNTER — Ambulatory Visit (INDEPENDENT_AMBULATORY_CARE_PROVIDER_SITE_OTHER): Payer: Medicare Other | Admitting: Family Medicine

## 2015-05-18 ENCOUNTER — Encounter: Payer: Self-pay | Admitting: Family Medicine

## 2015-05-18 VITALS — BP 144/88 | HR 87 | Temp 98.0°F | Ht 64.0 in | Wt 167.6 lb

## 2015-05-18 DIAGNOSIS — M545 Low back pain: Secondary | ICD-10-CM | POA: Diagnosis not present

## 2015-05-18 DIAGNOSIS — R600 Localized edema: Secondary | ICD-10-CM | POA: Diagnosis not present

## 2015-05-18 DIAGNOSIS — I10 Essential (primary) hypertension: Secondary | ICD-10-CM | POA: Diagnosis not present

## 2015-05-18 DIAGNOSIS — I482 Chronic atrial fibrillation: Secondary | ICD-10-CM

## 2015-05-18 DIAGNOSIS — E559 Vitamin D deficiency, unspecified: Secondary | ICD-10-CM

## 2015-05-18 DIAGNOSIS — M7989 Other specified soft tissue disorders: Secondary | ICD-10-CM

## 2015-05-18 DIAGNOSIS — G8929 Other chronic pain: Secondary | ICD-10-CM | POA: Diagnosis not present

## 2015-05-18 DIAGNOSIS — E785 Hyperlipidemia, unspecified: Secondary | ICD-10-CM

## 2015-05-18 DIAGNOSIS — M25562 Pain in left knee: Secondary | ICD-10-CM

## 2015-05-18 DIAGNOSIS — I4821 Permanent atrial fibrillation: Secondary | ICD-10-CM

## 2015-05-18 DIAGNOSIS — Z8542 Personal history of malignant neoplasm of other parts of uterus: Secondary | ICD-10-CM | POA: Diagnosis not present

## 2015-05-18 NOTE — Patient Instructions (Addendum)
Continue current medications. Continue good therapeutic lifestyle changes which include good diet and exercise. Fall precautions discussed with patient. If an FOBT was given today- please return it to our front desk. If you are over 73 years old - you may need Prevnar 30 or the adult Pneumonia vaccine.  Flu Shots will be available at our office starting mid- September. Please call and schedule a FLU CLINIC APPOINTMENT.                        Medicare Annual Wellness Visit  [facility] and the medical providers at [facility] strive to bring you the best medical care.  In doing so we not only want to address your current medical conditions and concerns but also to detect new conditions early and prevent illness, disease and health-related problems.    Medicare offers a yearly Wellness Visit which allows our clinical staff to assess your need for preventative services including immunizations, lifestyle education, counseling to decrease risk of preventable diseases and screening for fall risk and other medical concerns.    This visit is provided free of charge (no copay) for all Medicare recipients. The clinical pharmacists at [facility] have begun to conduct these Wellness Visits which will also include a thorough review of all your medications.    As you primary medical provider recommend that you make an appointment for your Annual Wellness Visit if you have not done so already this year.  You may set up this appointment before you leave today or you may call back [phone] and schedule an appointment.  Please make sure when you call that you mention that you are scheduling your Annual Wellness Visit with the clinical pharmacist so that the appointment may be made for the proper length of time.    We will arrange for the patient have a CT scan of the abdomen and pelvis and a visit with the vascular surgeon to evaluate this asymmetric edema and the  left leg compared to the right She should use her cane and continue to be careful with not following Take Tylenol as needed for pain The patient should call the neurosurgeon regarding her back pain and left knee pain for follow-up

## 2015-05-18 NOTE — Progress Notes (Signed)
Subjective:    Patient ID: Melinda Collins, female    DOB: [birth year], 74 y.o.   MRN: [id number]  HPI Patient is here today for a 4 month follow up. Patient is also complaining with left knee pain. The patient was recently seen for edema and the left leg only and was scheduled for venous Dopplers which were negative for DVT. She continues to have some left knee pain. The weight since the last visit is unchanged and remains at 167. The patient does not want to get a mammogram or a pelvic exam. The patient is anxious today because she has a brother that is been admitted to the hospital with history of cancer and abdominal swelling. The patient today denies any chest pain or shortness of breath. She is still concern with the edema and the left leg which appears to be up closer to the knee. She also complains of left knee pain. She denies any more shortness of breath than usual or chest pain. She is not having any GI symptoms or problems passing her water.   Review of Systems  Constitutional: Negative.   HENT: Negative.   Eyes: Negative.   Respiratory: Negative.   Cardiovascular: Negative.   Gastrointestinal: Negative.   Endocrine: Negative.   Genitourinary: Negative.   Musculoskeletal:       Left knee pain   Skin: Negative.   Allergic/Immunologic: Negative.   Neurological: Negative.   Hematological: Negative.   Psychiatric/Behavioral: Negative.          Patient Active Problem List   Diagnosis Date Noted  . Permanent atrial fibrillation ([place]) 10/16/2014  . Metabolic syndrome 28/31/5176  . History of uterine cancer 12/03/2013  . Chronic low back pain 05/23/2013  . High risk medication use 05/23/2013  . Swelling of limb 05/03/2013  . Hypertension 05/17/2011  . HYPERLIPIDEMIA 02/10/2009  . TOBACCO ABUSE 02/10/2009  . 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 Atrial fibrillation ([place]) 02/10/2009   Outpatient Encounter Prescriptions as of 05/18/2015  Medication Sig  . albuterol  (PROVENTIL) (2.5 MG/3ML) 0.083% nebulizer solution Take 3 mLs (2.5 mg total) by nebulization every 6 (six) hours as needed for wheezing or shortness of breath.  Marland Kitchen amLODipine (NORVASC) 5 MG tablet TAKE ONE TABLET BY MOUTH ONCE DAILY  . atorvastatin (LIPITOR) 80 MG tablet TAKE ONE TABLET BY MOUTH DAILY  . azelastine (ASTELIN) 0.1 % nasal spray USE ONE SPRAY IN EACH NOSTRIL TWICE DAILY  . budesonide (PULMICORT) 0.5 MG/2ML nebulizer solution Take 2 mLs (0.5 mg total) by nebulization 2 (two) times daily.  . cyclobenzaprine (FLEXERIL) 5 MG tablet Take 1 tablet (5 mg total) by mouth 3 (three) times daily as needed for muscle spasms.  . fluticasone (FLONASE) 50 MCG/ACT nasal spray USE 2 SPRAYS IN EACH NOSTRIL ONCE DAILY.  SHAKE GENTLY BEFORE USING.  . Multiple Vitamins-Minerals (CENTRUM SILVER PO) Take 1 tablet by mouth daily.    . NON FORMULARY Oxygen 3L/min   . oxyCODONE-acetaminophen (PERCOCET) 5-325 MG per tablet Take 2 tablets by mouth every 8 (eight) hours as needed.    Marland Kitchen PARoxetine (PAXIL) 40 MG tablet TAKE 1 AND 1/2 TABLETS BY MOUTH DAILY.  Marland Kitchen PROAIR HFA 108 (90 BASE) MCG/ACT inhaler   . torsemide (DEMADEX) 20 MG tablet TAKE 1 AND 1/2 TABLETS BY MOUTH ONCE DAILY.  Marland Kitchen varenicline (CHANTIX STARTING MONTH PAK) 0.5 MG X 11 & 1 MG X 42 tablet Take one 0.5 mg tablet by mouth once daily for 3 days, then increase to one 0.5  mg tablet twice daily for 4 days, then increase to one 1 mg tablet twice daily.  Marland Kitchen warfarin (COUMADIN) 5 MG tablet TAKE 1 TO 2 TABLETS BY MOUTH AS DIRECTED BY ANTICOAGULATION CLINIC   No facility-administered encounter medications on file as of 05/18/2015.       Objective:   Physical Exam  Constitutional: She is oriented to person, place, and time. She appears well-developed and well-nourished. She appears distressed.  The patient is distressed regarding her brother who is been admitted to the hospital.  HENT:  Head: Normocephalic and atraumatic.  Right Ear: External ear normal.    Left Ear: External ear normal.  Nose: Nose normal.  Mouth/Throat: Oropharynx is clear and moist.  Eyes: Conjunctivae and EOM are normal. Pupils are equal, round, and reactive to light. Right eye exhibits no discharge. Left eye exhibits no discharge. No scleral icterus.  Neck: Normal range of motion. Neck supple. No thyromegaly present.  Cardiovascular: Normal rate, normal heart sounds and intact distal pulses.  Exam reveals no gallop and no friction rub.   No murmur heard. Heart was slightly irregular today at about 84/m  Pulmonary/Chest: Effort normal and breath sounds normal. No respiratory distress. She has no wheezes. She has no rales. She exhibits no tenderness.  Abdominal: Soft. Bowel sounds are normal. She exhibits no mass. There is no tenderness. There is no rebound and no guarding.  No masses tenderness or organ enlargement or inguinal adenopathy  Musculoskeletal: She exhibits edema. She exhibits no tenderness.  The patient uses a cane because of her left knee and back problems. There is slight swelling below the left knee but no rubor or erythema.  Lymphadenopathy:    She has no cervical adenopathy.  Neurological: She is alert and oriented to person, place, and time. She has normal reflexes. No cranial nerve deficit.  Skin: Skin is warm and dry. No rash noted.  Psychiatric: She has a normal mood and affect. Her behavior is normal. Judgment and thought content normal.  Nursing note and vitals reviewed.  BP 144/88 mmHg  Pulse 87  Temp(Src) 98 F (36.7 C) (Oral)  Ht _0  (1.626 m)  Wt 167 lb 9.6 oz (76.023 kg)  BMI 28.75 kg/m2        Assessment & Plan:  1. Permanent atrial fibrillation (HCC) -The patient will follow-up regularly with the cardiologist.  2. Essential hypertension -Her systolic blood pressure was slightly elevated today but she is stressed because of her brothers hospitalization. - BMP8+EGFR - CBC with Differential/Platelet  3.  Hyperlipidemia -Continue current treatment pending results of lab work - Lipid panel - Hepatic function panel  4. Chronic low back pain -Follow up with neurosurgeon  5. Localized edema -Schedule ultrasound of abdomen and pelvis and visit with vascular surgeon  6. Vitamin D deficiency -Continue current treatment pending results of lab work - Vit D  25 hydroxy (rtn osteoporosis monitoring)  Patient Instructions  Continue current medications. Continue good therapeutic lifestyle changes which include good diet and exercise. Fall precautions discussed with patient. If an FOBT was given today- please return it to our front desk. If you are over 20 years old - you may need Prevnar 66 or the adult Pneumonia vaccine.  Flu Shots will be available at our office starting mid- September. Please call and schedule a FLU CLINIC APPOINTMENT.                        Medicare Annual Wellness Visit  [facility] and the medical providers at [facility] strive to bring you the best medical care.  In doing so we not only want to address your current medical conditions and concerns but also to detect new conditions early and prevent illness, disease and health-related problems.    Medicare offers a yearly Wellness Visit which allows our clinical staff to assess your need for preventative services including immunizations, lifestyle education, counseling to decrease risk of preventable diseases and screening for fall risk and other medical concerns.    This visit is provided free of charge (no copay) for all Medicare recipients. The clinical pharmacists at [facility] have begun to conduct these Wellness Visits which will also include a thorough review of all your medications.    As you primary medical provider recommend that you make an appointment for your Annual Wellness Visit if you have not done so already this year.  You may set up this appointment before you  leave today or you may call back [phone] and schedule an appointment.  Please make sure when you call that you mention that you are scheduling your Annual Wellness Visit with the clinical pharmacist so that the appointment may be made for the proper length of time.    We will arrange for the patient have a CT scan of the abdomen and pelvis and a visit with the vascular surgeon to evaluate this asymmetric edema and the left leg compared to the right She should use her cane and continue to be careful with not following Take Tylenol as needed for pain The patient should call the neurosurgeon regarding her back pain and left knee pain for follow-up   Arrie Senate MD

## 2015-05-27 ENCOUNTER — Encounter: Payer: Self-pay | Admitting: Family Medicine

## 2015-05-29 ENCOUNTER — Telehealth: Payer: Self-pay

## 2015-05-29 NOTE — Telephone Encounter (Signed)
[facility] to x-ray 

## 2015-06-01 ENCOUNTER — Ambulatory Visit (INDEPENDENT_AMBULATORY_CARE_PROVIDER_SITE_OTHER): Payer: Medicare Other | Admitting: Pharmacist

## 2015-06-01 DIAGNOSIS — I482 Chronic atrial fibrillation: Secondary | ICD-10-CM

## 2015-06-01 DIAGNOSIS — I4821 Permanent atrial fibrillation: Secondary | ICD-10-CM

## 2015-06-01 LAB — POCT INR: INR: 2.8

## 2015-06-01 NOTE — Patient Instructions (Signed)
Anticoagulation Dose Instructions as of 06/01/2015      Melinda Collins Tue Wed Thu Fri Sat   New Dose 7.5 mg 10 mg 7.5 mg 7.5 mg 7.5 mg 10 mg 7.5 mg    Description        Continue warfarin dose to 1 and 1/2 tablet on sundays, tuesdays, wednesdays, thursdays and saturdays.  Take 2 tablets mondays and fridays.      INR was 2.8 today

## 2015-06-03 ENCOUNTER — Ambulatory Visit (HOSPITAL_COMMUNITY): Payer: Medicare Other

## 2015-06-22 ENCOUNTER — Other Ambulatory Visit: Payer: Self-pay | Admitting: Family Medicine

## 2015-06-25 ENCOUNTER — Ambulatory Visit (INDEPENDENT_AMBULATORY_CARE_PROVIDER_SITE_OTHER): Payer: Medicare Other | Admitting: Pharmacist

## 2015-06-25 DIAGNOSIS — I4891 Unspecified atrial fibrillation: Secondary | ICD-10-CM | POA: Diagnosis not present

## 2015-06-25 LAB — POCT INR: INR: 3.8

## 2015-06-25 NOTE — Patient Instructions (Signed)
Anticoagulation Dose Instructions as of 06/25/2015      Melinda Collins Tue Wed Thu Fri Sat   New Dose 7.5 mg 10 mg 7.5 mg 7.5 mg 7.5 mg 7.5 mg 7.5 mg    Description        No warfarin today - Thursday, December 8th.  Then decrease warfarin dose to 2 tablets on mondays only and 1 and 1/2 tablet all other days     INR was 3.8 today

## 2015-07-02 ENCOUNTER — Encounter: Payer: Self-pay | Admitting: Pharmacist

## 2015-07-08 ENCOUNTER — Ambulatory Visit (INDEPENDENT_AMBULATORY_CARE_PROVIDER_SITE_OTHER): Payer: Medicare Other | Admitting: Pharmacist

## 2015-07-08 DIAGNOSIS — I4821 Permanent atrial fibrillation: Secondary | ICD-10-CM

## 2015-07-08 DIAGNOSIS — I482 Chronic atrial fibrillation: Secondary | ICD-10-CM | POA: Diagnosis not present

## 2015-07-08 LAB — POCT INR: INR: 2.6

## 2015-07-08 NOTE — Patient Instructions (Signed)
Anticoagulation Dose Instructions as of 07/08/2015      Dorene Grebe Tue Wed Thu Fri Sat   New Dose 7.5 mg 10 mg 7.5 mg 7.5 mg 7.5 mg 7.5 mg 7.5 mg    Description        Continue current warfarin dose of 2 tablets on mondays only and 1 and 1/2 tablet all other days     INR was 2.8 today.

## 2015-07-27 DIAGNOSIS — J449 Chronic obstructive pulmonary disease, unspecified: Secondary | ICD-10-CM | POA: Diagnosis not present

## 2015-08-05 ENCOUNTER — Ambulatory Visit: Payer: Self-pay | Admitting: Pharmacist

## 2015-08-06 ENCOUNTER — Encounter: Payer: Self-pay | Admitting: Family Medicine

## 2015-08-13 ENCOUNTER — Ambulatory Visit (INDEPENDENT_AMBULATORY_CARE_PROVIDER_SITE_OTHER): Payer: Medicare Other | Admitting: Pharmacist

## 2015-08-13 DIAGNOSIS — I482 Chronic atrial fibrillation: Secondary | ICD-10-CM

## 2015-08-13 DIAGNOSIS — I4821 Permanent atrial fibrillation: Secondary | ICD-10-CM

## 2015-08-13 LAB — POCT INR: INR: 2.3

## 2015-08-13 NOTE — Patient Instructions (Signed)
Anticoagulation Dose Instructions as of 08/13/2015      Dorene Grebe Tue Wed Thu Fri Sat   New Dose 7.5 mg 10 mg 7.5 mg 7.5 mg 7.5 mg 10 mg 7.5 mg    Description        Continue current warfarin dose of 2 tablets on mondays and fridays and 1 and 1/2 tablet all other days     INR was 2.3 today

## 2015-08-14 ENCOUNTER — Other Ambulatory Visit: Payer: Self-pay | Admitting: Family Medicine

## 2015-08-18 ENCOUNTER — Other Ambulatory Visit: Payer: Self-pay | Admitting: Family Medicine

## 2015-08-21 ENCOUNTER — Other Ambulatory Visit: Payer: Self-pay | Admitting: Family Medicine

## 2015-08-26 DIAGNOSIS — J449 Chronic obstructive pulmonary disease, unspecified: Secondary | ICD-10-CM | POA: Diagnosis not present

## 2015-09-16 DIAGNOSIS — I808 Phlebitis and thrombophlebitis of other sites: Secondary | ICD-10-CM | POA: Diagnosis not present

## 2015-09-16 DIAGNOSIS — M4716 Other spondylosis with myelopathy, lumbar region: Secondary | ICD-10-CM | POA: Diagnosis not present

## 2015-09-18 ENCOUNTER — Other Ambulatory Visit: Payer: Self-pay | Admitting: Family Medicine

## 2015-09-23 ENCOUNTER — Ambulatory Visit (INDEPENDENT_AMBULATORY_CARE_PROVIDER_SITE_OTHER): Payer: Medicare Other | Admitting: Family Medicine

## 2015-09-23 ENCOUNTER — Encounter: Payer: Self-pay | Admitting: Family Medicine

## 2015-09-23 VITALS — BP 116/75 | HR 98 | Temp 98.6°F | Ht 64.0 in | Wt 172.0 lb

## 2015-09-23 DIAGNOSIS — F1910 Other psychoactive substance abuse, uncomplicated: Secondary | ICD-10-CM

## 2015-09-23 DIAGNOSIS — I4821 Permanent atrial fibrillation: Secondary | ICD-10-CM

## 2015-09-23 DIAGNOSIS — I482 Chronic atrial fibrillation: Secondary | ICD-10-CM

## 2015-09-23 DIAGNOSIS — G8929 Other chronic pain: Secondary | ICD-10-CM

## 2015-09-23 DIAGNOSIS — I10 Essential (primary) hypertension: Secondary | ICD-10-CM

## 2015-09-23 DIAGNOSIS — M545 Low back pain, unspecified: Secondary | ICD-10-CM

## 2015-09-23 DIAGNOSIS — J439 Emphysema, unspecified: Secondary | ICD-10-CM

## 2015-09-23 DIAGNOSIS — E785 Hyperlipidemia, unspecified: Secondary | ICD-10-CM | POA: Diagnosis not present

## 2015-09-23 DIAGNOSIS — Z720 Tobacco use: Secondary | ICD-10-CM

## 2015-09-23 LAB — COAGUCHEK XS/INR WAIVED
INR: 2.7 — ABNORMAL HIGH (ref 0.9–1.1)
PROTHROMBIN TIME: 32.2 s

## 2015-09-23 NOTE — Patient Instructions (Signed)
The patient should continue to follow-up with Dr. Verita Schneiders and get the MRI as recommended She should reduce her Paxil by taking a half a one daily for the next 7-10 days and then a half a one every other day for the second 10 days and then try to stop it. At that point in time we can try a different antidepressant. She should continue to make all efforts at stopping smoking. She should return the FOBT

## 2015-09-23 NOTE — Progress Notes (Signed)
Subjective:    Patient ID: Melinda Collins, female    DOB: 03-21-1941, 75 y.o.   MRN: [id number]  HPI Patient is here today for a 4 month follow up on her chronic medical problems. Patient is also complaining with having no energy, tired all the time, and she doesn't feel like her Paxil is working. He is due to return an FOBT. She is refusing to have a Pap smear mammogram or colonoscopy. She is requesting a refill on her Flexeril. She may or may not get this refilled. She has been seeing the neurosurgeon and he is considering getting another MRI and possibly more surgery if he finds something that's causing her constant severe pain where she is not able to have but very little activity without pain. She appears to be miserable. She has reduced the Paxil in the past and has not felt any signs or symptoms with reducing this. We discussed that if she wanted to reduce it and she is only taking 40 mg a day that she should reduce it by half appeal daily for at least a week and a half a one every other day for a couple weeks. This will give [place] some time to get her blood work back and make sure that there is nothing else is causing her depression other than her pain. At that point in time we can consider trying a different antidepressant. She denies any chest pain but does have a lot of shortness of breath and continues to smoke and admits that a lot of this is smoking related. She does have a lot of belching and burping but no heartburn or indigestion. She is passing her water without problems. She has not seen any blood in the stool or had any black tarry bowel movements. Because of the severe pain she is having in her back she says she is not able to do a Pap smear pelvic exam mammogram or colonoscopy at this time. We will give her an FOBT to return. Dr. Ellouise Newer has another MRI planned for her back evaluation.   Review of Systems  Constitutional: Positive for fatigue.  HENT: Negative.   Eyes: Negative.     Respiratory: Negative.   Cardiovascular: Negative.   Gastrointestinal: Negative.   Endocrine: Negative.   Genitourinary: Negative.   Musculoskeletal: Negative.   Skin: Negative.   Allergic/Immunologic: Negative.   Neurological: Negative.   Hematological: Negative.   Psychiatric/Behavioral:       Depression- Does not feel like medication is working.         Patient Active Problem List   Diagnosis Date Noted  . Permanent atrial fibrillation ([place]) 10/16/2014  . Metabolic syndrome 76/16/0737  . History of uterine cancer 12/03/2013  . Chronic low back pain 05/23/2013  . High risk medication use 05/23/2013  . Swelling of limb 05/03/2013  . Hypertension 05/17/2011  . Hyperlipidemia 02/10/2009  . TOBACCO ABUSE 02/10/2009  . 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 Atrial fibrillation ([place]) 02/10/2009   Outpatient Encounter Prescriptions as of 09/23/2015  Medication Sig  . albuterol (PROVENTIL) (2.5 MG/3ML) 0.083% nebulizer solution Take 3 mLs (2.5 mg total) by nebulization every 6 (six) hours as needed for wheezing or shortness of breath.  Marland Kitchen amLODipine (NORVASC) 5 MG tablet TAKE ONE TABLET BY MOUTH ONCE DAILY  . atorvastatin (LIPITOR) 80 MG tablet TAKE ONE TABLET BY MOUTH DAILY  . azelastine (ASTELIN) 0.1 % nasal spray USE ONE SPRAY IN EACH NOSTRIL TWICE DAILY  . budesonide (PULMICORT) 0.5 MG/2ML  nebulizer solution Take 2 mLs (0.5 mg total) by nebulization 2 (two) times daily.  . cyclobenzaprine (FLEXERIL) 5 MG tablet Take 1 tablet (5 mg total) by mouth 3 (three) times daily as needed for muscle spasms.  . fluticasone (FLONASE) 50 MCG/ACT nasal spray USE 2 SPRAYS IN EACH NOSTRIL ONCE DAILY. SHAKE GENTLY BEFORE USING.  . Multiple Vitamins-Minerals (CENTRUM SILVER PO) Take 1 tablet by mouth daily.    . NON FORMULARY Oxygen 3L/min   . oxyCODONE-acetaminophen (PERCOCET) 5-325 MG per tablet Take 2 tablets by mouth every 8 (eight) hours as needed.    Marland Kitchen PARoxetine (PAXIL) 40 MG tablet TAKE 1  AND 1/2 TABLETS BY MOUTH DAILY.  Marland Kitchen PROAIR HFA 108 (90 Base) MCG/ACT inhaler INHALE ONE PUFF INTO THE LUNGS FOUR TIMES DAILY AS NEEDED FOR WHEEZING  . torsemide (DEMADEX) 20 MG tablet TAKE 1 AND 1/2 TABLETS BY MOUTH ONCE DAILY.  Marland Kitchen warfarin (COUMADIN) 5 MG tablet TAKE 1 TO 2 TABLETS BY MOUTH AS DIRECTED BY ANTICOAGULATION CLINIC  . [DISCONTINUED] PROAIR HFA 108 (90 BASE) MCG/ACT inhaler Reported on 09/23/2015  . [DISCONTINUED] varenicline (CHANTIX STARTING MONTH PAK) 0.5 MG X 11 & 1 MG X 42 tablet Take one 0.5 mg tablet by mouth once daily for 3 days, then increase to one 0.5 mg tablet twice daily for 4 days, then increase to one 1 mg tablet twice daily. (Patient not taking: Reported on 09/23/2015)   No facility-administered encounter medications on file as of 09/23/2015.       Objective:   Physical Exam  Constitutional: She is oriented to person, place, and time. She appears well-developed and well-nourished. She appears distressed.  HENT:  Head: Atraumatic.  Right Ear: External ear normal.  Left Ear: External ear normal.  Nose: Nose normal.  Mouth/Throat: Oropharynx is clear and moist.  Eyes: Conjunctivae and EOM are normal. Pupils are equal, round, and reactive to light. Right eye exhibits no discharge. Left eye exhibits no discharge. No scleral icterus.  Neck: Normal range of motion. Neck supple. No thyromegaly present.  Without bruits thyromegaly or anterior cervical adenopathy  Cardiovascular: Normal rate, regular rhythm, normal heart sounds and intact distal pulses.   No murmur heard. The heart is irregular irregular at 72/m  Pulmonary/Chest: Effort normal. No respiratory distress. She has no wheezes. She has no rales.  Decreased breath sounds bilaterally with occasional wheezes  Abdominal: Soft. Bowel sounds are normal. She exhibits no mass. There is no tenderness. There is no rebound and no guarding.  Genitourinary:  The breasts were checked today and there was no sign of any masses or  axillary adenopathy. She has adipose tissue on the chest wall below the breast bilaterally with the left being worse than the right.  Musculoskeletal: Normal range of motion. She exhibits no edema or tenderness.  She is unable to lay down without having her back elevated somewhat on the table because of the severe pain in her back.  Lymphadenopathy:    She has no cervical adenopathy.  Neurological: She is alert and oriented to person, place, and time. She has normal reflexes. No cranial nerve deficit.  Skin: Skin is warm and dry. No rash noted.  Psychiatric: She has a normal mood and affect. Her behavior is normal. Judgment and thought content normal.  Nursing note and vitals reviewed.  BP 116/75 mmHg  Pulse 98  Temp(Src) 98.6 F (37 C) (Oral)  Ht _0  (1.626 m)  Wt 172 lb (78.019 kg)  BMI 29.51 kg/m2  Assessment & Plan:  1. Essential hypertension -The blood pressure is good today despite the pain that she was in and she should continue with her current medication. - BMP8+EGFR - CBC with Differential/Platelet  2. Hyperlipidemia -She should continue with her atorvastatin pending results of lab work - Lipid panel - Hepatic function panel - CBC with Differential/Platelet  3. Permanent atrial fibrillation ([place]) -Continue periodic follow-up with cardiology as recommended - CoaguChek XS/INR Waived; Standing - CoaguChek XS/INR Waived  4. Chronic low back pain -Follow up with Dr. Carloyn Manner as planned  5. Nicotine abuse -Continue to make every effort at stopping smoking  6. Pulmonary emphysema, unspecified emphysema type ([place]) -Continue with inhalers and stop smoking  No orders of the defined types were placed in this encounter.   Patient Instructions  The patient should continue to follow-up with Dr. Verita Schneiders and get the MRI as recommended She should reduce her Paxil by taking a half a one daily for the next 7-10 days and then a half a one every other day for the second 10  days and then try to stop it. At that point in time we can try a different antidepressant. She should continue to make all efforts at stopping smoking. She should return the FOBT   Arrie Senate MD

## 2015-09-24 LAB — CBC WITH DIFFERENTIAL/PLATELET
BASOS ABS: 0 10*3/uL (ref 0.0–0.2)
Basos: 0 %
EOS (ABSOLUTE): 0.1 10*3/uL (ref 0.0–0.4)
Eos: 2 %
HEMOGLOBIN: 13.5 g/dL (ref 11.1–15.9)
Hematocrit: 41.5 % (ref 34.0–46.6)
Immature Grans (Abs): 0 10*3/uL (ref 0.0–0.1)
Immature Granulocytes: 0 %
LYMPHS ABS: 1.8 10*3/uL (ref 0.7–3.1)
Lymphs: 20 %
MCH: 29.8 pg (ref 26.6–33.0)
MCHC: 32.5 g/dL (ref 31.5–35.7)
MCV: 92 fL (ref 79–97)
MONOCYTES: 8 %
Monocytes Absolute: 0.7 10*3/uL (ref 0.1–0.9)
NEUTROS ABS: 6.2 10*3/uL (ref 1.4–7.0)
Neutrophils: 70 %
Platelets: 204 10*3/uL (ref 150–379)
RBC: 4.53 x10E6/uL (ref 3.77–5.28)
RDW: 15.7 % — ABNORMAL HIGH (ref 12.3–15.4)
WBC: 8.9 10*3/uL (ref 3.4–10.8)

## 2015-09-24 LAB — BMP8+EGFR
BUN/Creatinine Ratio: 18 (ref 11–26)
BUN: 20 mg/dL (ref 8–27)
CALCIUM: 8.5 mg/dL — AB (ref 8.7–10.3)
CO2: 23 mmol/L (ref 18–29)
CREATININE: 1.14 mg/dL — AB (ref 0.57–1.00)
Chloride: 102 mmol/L (ref 96–106)
GFR, EST AFRICAN AMERICAN: 55 mL/min/{1.73_m2} — AB (ref >59)
GFR, EST NON AFRICAN AMERICAN: 48 mL/min/{1.73_m2} — AB (ref >59)
Glucose: 108 mg/dL — ABNORMAL HIGH (ref 65–99)
Potassium: 3.6 mmol/L (ref 3.5–5.2)
Sodium: 146 mmol/L — ABNORMAL HIGH (ref 134–144)

## 2015-09-24 LAB — HEPATIC FUNCTION PANEL
ALBUMIN: 3.9 g/dL (ref 3.5–4.8)
ALT: 21 IU/L (ref 0–32)
AST: 20 IU/L (ref 0–40)
Alkaline Phosphatase: 102 IU/L (ref 39–117)
Bilirubin Total: 0.4 mg/dL (ref 0.0–1.2)
Bilirubin, Direct: 0.12 mg/dL (ref 0.00–0.40)
TOTAL PROTEIN: 6.1 g/dL (ref 6.0–8.5)

## 2015-09-24 LAB — LIPID PANEL
Chol/HDL Ratio: 3.6 ratio units (ref 0.0–4.4)
Cholesterol, Total: 167 mg/dL (ref 100–199)
HDL: 47 mg/dL (ref >39)
LDL CALC: 46 mg/dL (ref 0–99)
Triglycerides: 372 mg/dL — ABNORMAL HIGH (ref 0–149)
VLDL CHOLESTEROL CAL: 74 mg/dL — AB (ref 5–40)

## 2015-09-28 DIAGNOSIS — J449 Chronic obstructive pulmonary disease, unspecified: Secondary | ICD-10-CM | POA: Diagnosis not present

## 2015-10-01 ENCOUNTER — Ambulatory Visit (INDEPENDENT_AMBULATORY_CARE_PROVIDER_SITE_OTHER): Payer: Medicare Other | Admitting: Pharmacist

## 2015-10-01 DIAGNOSIS — I482 Chronic atrial fibrillation: Secondary | ICD-10-CM | POA: Diagnosis not present

## 2015-10-01 DIAGNOSIS — I4821 Permanent atrial fibrillation: Secondary | ICD-10-CM

## 2015-10-01 DIAGNOSIS — E782 Mixed hyperlipidemia: Secondary | ICD-10-CM

## 2015-10-01 LAB — COAGUCHEK XS/INR WAIVED
INR: 2.5 — ABNORMAL HIGH (ref 0.9–1.1)
Prothrombin Time: 30.6 s

## 2015-10-01 NOTE — Progress Notes (Signed)
Subjective:     Indication: atrial fibrillation Bleeding signs/symptoms: None Thromboembolic signs/symptoms: None  Missed Coumadin doses: None Medication changes: no Dietary changes: no Bacterial/viral infection: no Other concerns: yes - patient would like me to review her last labs and discuss elevated triglycerides  The following portions of the patient's history were reviewed and updated as appropriate: allergies, current medications, past medical history and problem list.    Objective:    INR Today: 2.5  Lipid Panel     Component Value Date/Time   CHOL 167 09/23/2015 1459   CHOL 121 08/06/2013 1123   CHOL 138 03/25/2013 1555   TRIG 372* 09/23/2015 1459   TRIG 136 08/06/2013 1123   TRIG 361* 03/25/2013 1555   HDL 47 09/23/2015 1459   HDL 48 08/06/2013 1123   HDL 42 03/25/2013 1555   CHOLHDL 3.6 09/23/2015 1459   LDLCALC 46 09/23/2015 1459   LDLCALC 46 08/06/2013 1123   LDLCALC 24 03/25/2013 1555    Assessment:    Therapeutic INR for goal of 2-3   Elevated BG and Tg - patient hand not fasting for 4 to 8 hour prior to above labs.   Plan:    1. New dose: no change   2. Next INR: 3 weeks   3.  Discuss dietary changes to help lower Tg and BG.   Increase non-starchy vegetables - carrots, green bean, squash, zucchini, tomatoes, onions, peppers, spinach and other green leafy vegetables, cabbage, lettuce, cucumbers, asparagus, okra (not fried), eggplant limit sugar and processed foods (cakes, cookies, ice cream, crackers and chips) Increase fresh fruit but limit serving sizes 1/2 cup or about the size of tennis or baseball limit red meat to no more than 1-2 times per week (serving size about the size of your palm) Choose whole grains / lean proteins - whole wheat bread, quinoa, whole grain rice (1/2 cup), fish, chicken, [place]   Cherre Robins, PharmD, CPP, CDE

## 2015-10-01 NOTE — Patient Instructions (Signed)
Anticoagulation Dose Instructions as of 10/01/2015      Melinda Collins Tue Wed Thu Fri Sat   New Dose 7.5 mg 10 mg 7.5 mg 7.5 mg 7.5 mg 10 mg 7.5 mg    Description        Continue current warfarin dose of 2 tablets on mondays and fridays and 1 and 1/2 tablet all other days     INR was 2.5 today

## 2015-10-22 ENCOUNTER — Encounter: Payer: Self-pay | Admitting: Pharmacist

## 2015-10-23 ENCOUNTER — Other Ambulatory Visit: Payer: Self-pay | Admitting: Family Medicine

## 2015-10-23 ENCOUNTER — Encounter: Payer: Self-pay | Admitting: Family Medicine

## 2015-10-27 DIAGNOSIS — J449 Chronic obstructive pulmonary disease, unspecified: Secondary | ICD-10-CM | POA: Diagnosis not present

## 2015-10-27 DIAGNOSIS — M4716 Other spondylosis with myelopathy, lumbar region: Secondary | ICD-10-CM | POA: Diagnosis not present

## 2015-11-02 DIAGNOSIS — M4326 Fusion of spine, lumbar region: Secondary | ICD-10-CM | POA: Diagnosis not present

## 2015-11-02 DIAGNOSIS — M4716 Other spondylosis with myelopathy, lumbar region: Secondary | ICD-10-CM | POA: Diagnosis not present

## 2015-11-02 DIAGNOSIS — Z981 Arthrodesis status: Secondary | ICD-10-CM | POA: Diagnosis not present

## 2015-11-04 ENCOUNTER — Encounter (INDEPENDENT_AMBULATORY_CARE_PROVIDER_SITE_OTHER): Payer: Self-pay

## 2015-11-04 ENCOUNTER — Ambulatory Visit (INDEPENDENT_AMBULATORY_CARE_PROVIDER_SITE_OTHER): Payer: Medicare Other | Admitting: Pharmacist

## 2015-11-04 DIAGNOSIS — I482 Chronic atrial fibrillation: Secondary | ICD-10-CM | POA: Diagnosis not present

## 2015-11-04 DIAGNOSIS — I4821 Permanent atrial fibrillation: Secondary | ICD-10-CM

## 2015-11-04 LAB — COAGUCHEK XS/INR WAIVED
INR: 2.7 — AB (ref 0.9–1.1)
Prothrombin Time: 32.1 s

## 2015-11-04 NOTE — Patient Instructions (Signed)
Anticoagulation Dose Instructions as of 11/04/2015      Melinda Collins Tue Wed Thu Fri Sat   New Dose 7.5 mg 10 mg 7.5 mg 7.5 mg 7.5 mg 10 mg 7.5 mg    Description        Continue current warfarin dose of 2 tablets on mondays and fridays and 1 and 1/2 tablet all other days     INR was 2.7 today

## 2015-11-09 DIAGNOSIS — M5126 Other intervertebral disc displacement, lumbar region: Secondary | ICD-10-CM | POA: Diagnosis not present

## 2015-11-09 DIAGNOSIS — M4716 Other spondylosis with myelopathy, lumbar region: Secondary | ICD-10-CM | POA: Diagnosis not present

## 2015-11-09 DIAGNOSIS — M960 Pseudarthrosis after fusion or arthrodesis: Secondary | ICD-10-CM | POA: Diagnosis not present

## 2015-11-09 DIAGNOSIS — I700 Atherosclerosis of aorta: Secondary | ICD-10-CM | POA: Diagnosis not present

## 2015-11-09 DIAGNOSIS — I714 Abdominal aortic aneurysm, without rupture: Secondary | ICD-10-CM | POA: Diagnosis not present

## 2015-11-18 ENCOUNTER — Other Ambulatory Visit: Payer: Self-pay | Admitting: Family Medicine

## 2015-11-18 MED ORDER — BUDESONIDE 0.5 MG/2ML IN SUSP: 0.5000 mg | Freq: Two times a day (BID) | RESPIRATORY_TRACT | Status: DC

## 2015-11-18 NOTE — Telephone Encounter (Signed)
Detailed message left that rx sent to pharmacy. ?

## 2015-11-25 ENCOUNTER — Other Ambulatory Visit: Payer: Self-pay | Admitting: Family Medicine

## 2015-11-25 DIAGNOSIS — M4716 Other spondylosis with myelopathy, lumbar region: Secondary | ICD-10-CM | POA: Diagnosis not present

## 2015-11-25 DIAGNOSIS — I808 Phlebitis and thrombophlebitis of other sites: Secondary | ICD-10-CM | POA: Diagnosis not present

## 2015-11-30 DIAGNOSIS — J449 Chronic obstructive pulmonary disease, unspecified: Secondary | ICD-10-CM | POA: Diagnosis not present

## 2015-12-04 DIAGNOSIS — M4716 Other spondylosis with myelopathy, lumbar region: Secondary | ICD-10-CM | POA: Diagnosis not present

## 2015-12-04 DIAGNOSIS — M960 Pseudarthrosis after fusion or arthrodesis: Secondary | ICD-10-CM | POA: Diagnosis not present

## 2015-12-07 ENCOUNTER — Ambulatory Visit (INDEPENDENT_AMBULATORY_CARE_PROVIDER_SITE_OTHER): Payer: Medicare Other | Admitting: Pharmacist

## 2015-12-07 DIAGNOSIS — I482 Chronic atrial fibrillation: Secondary | ICD-10-CM | POA: Diagnosis not present

## 2015-12-07 DIAGNOSIS — I4821 Permanent atrial fibrillation: Secondary | ICD-10-CM

## 2015-12-07 LAB — COAGUCHEK XS/INR WAIVED
INR: 2.6 — AB (ref 0.9–1.1)
Prothrombin Time: 31.1 s

## 2015-12-07 NOTE — Patient Instructions (Signed)
Anticoagulation Dose Instructions as of 12/07/2015      Melinda Collins Tue Wed Thu Fri Sat   New Dose 7.5 mg 10 mg 7.5 mg 7.5 mg 7.5 mg 10 mg 7.5 mg    Description        Continue current warfarin 5mg  dose of 2 tablets on mondays and fridays and 1 and 1/2 tablet all other days     INR was 2.6 today

## 2015-12-11 ENCOUNTER — Telehealth: Payer: Self-pay | Admitting: Pharmacist

## 2015-12-11 NOTE — Telephone Encounter (Signed)
Patient aware that she will need to be seen in order to get an antibiotic.

## 2015-12-24 ENCOUNTER — Other Ambulatory Visit: Payer: Self-pay | Admitting: Family Medicine

## 2015-12-28 DIAGNOSIS — J449 Chronic obstructive pulmonary disease, unspecified: Secondary | ICD-10-CM | POA: Diagnosis not present

## 2016-01-07 ENCOUNTER — Encounter: Payer: Self-pay | Admitting: Pharmacist

## 2016-01-11 ENCOUNTER — Ambulatory Visit (INDEPENDENT_AMBULATORY_CARE_PROVIDER_SITE_OTHER): Payer: Medicare Other | Admitting: Pharmacist

## 2016-01-11 ENCOUNTER — Encounter: Payer: Self-pay | Admitting: Pharmacist

## 2016-01-11 DIAGNOSIS — I482 Chronic atrial fibrillation: Secondary | ICD-10-CM | POA: Diagnosis not present

## 2016-01-11 DIAGNOSIS — I4821 Permanent atrial fibrillation: Secondary | ICD-10-CM

## 2016-01-11 LAB — COAGUCHEK XS/INR WAIVED
INR: 2.6 — ABNORMAL HIGH (ref 0.9–1.1)
Prothrombin Time: 30.6 s

## 2016-01-11 NOTE — Patient Instructions (Signed)
Anticoagulation Dose Instructions as of 01/11/2016      Melinda Collins Tue Wed Thu Fri Sat   New Dose 7.5 mg 10 mg 7.5 mg 7.5 mg 7.5 mg 10 mg 7.5 mg    Description        Continue current warfarin 5mg  dose of 2 tablets on mondays and fridays and 1 and 1/2 tablet all other days     INR was 2.6 today

## 2016-01-12 DIAGNOSIS — I808 Phlebitis and thrombophlebitis of other sites: Secondary | ICD-10-CM | POA: Diagnosis not present

## 2016-01-12 DIAGNOSIS — M4716 Other spondylosis with myelopathy, lumbar region: Secondary | ICD-10-CM | POA: Diagnosis not present

## 2016-01-13 ENCOUNTER — Telehealth: Payer: Self-pay | Admitting: Family Medicine

## 2016-01-13 MED ORDER — ALBUTEROL SULFATE HFA 108 (90 BASE) MCG/ACT IN AERS: 2.0000 | INHALATION_SPRAY | RESPIRATORY_TRACT | Status: DC | PRN

## 2016-01-13 NOTE — Telephone Encounter (Signed)
Okay to change dose as requested

## 2016-01-13 NOTE — Telephone Encounter (Signed)
Pt notified of change in dose on inhaler Verbalizes understanding RX sent in to [facility] per Dr Sabra Heck

## 2016-01-28 ENCOUNTER — Other Ambulatory Visit: Payer: Self-pay | Admitting: Family Medicine

## 2016-02-01 ENCOUNTER — Encounter: Payer: Self-pay | Admitting: Family Medicine

## 2016-02-01 ENCOUNTER — Ambulatory Visit (INDEPENDENT_AMBULATORY_CARE_PROVIDER_SITE_OTHER): Payer: Medicare Other | Admitting: Family Medicine

## 2016-02-01 VITALS — BP 121/75 | HR 100 | Temp 98.3°F | Ht 64.0 in | Wt 173.0 lb

## 2016-02-01 DIAGNOSIS — G8929 Other chronic pain: Secondary | ICD-10-CM

## 2016-02-01 DIAGNOSIS — I10 Essential (primary) hypertension: Secondary | ICD-10-CM | POA: Diagnosis not present

## 2016-02-01 DIAGNOSIS — E785 Hyperlipidemia, unspecified: Secondary | ICD-10-CM | POA: Diagnosis not present

## 2016-02-01 DIAGNOSIS — I480 Paroxysmal atrial fibrillation: Secondary | ICD-10-CM | POA: Diagnosis not present

## 2016-02-01 DIAGNOSIS — I4821 Permanent atrial fibrillation: Secondary | ICD-10-CM

## 2016-02-01 DIAGNOSIS — E559 Vitamin D deficiency, unspecified: Secondary | ICD-10-CM

## 2016-02-01 DIAGNOSIS — I482 Chronic atrial fibrillation: Secondary | ICD-10-CM

## 2016-02-01 DIAGNOSIS — M545 Low back pain: Secondary | ICD-10-CM | POA: Diagnosis not present

## 2016-02-01 DIAGNOSIS — J439 Emphysema, unspecified: Secondary | ICD-10-CM | POA: Diagnosis not present

## 2016-02-01 NOTE — Progress Notes (Signed)
Subjective:    Patient ID: Melinda Collins, female    DOB: 1940-12-17, 75 y.o.   MRN: [id number]  HPI Pt here for follow up and management of chronic medical problems which includes hyperlipidemia and a fib. She is taking medications regularly.The patient is doing well overall. She does complain of ongoing back pain and she has had surgery for this in the past. She's been seen by the neurosurgeon. She also has chronic atrial fibrillation. She is due today to return an FOBT and will return to the office for fasting lab work. She is past you on her mammogram and refuses to get a mammogram. She would prefer to wait until a different time. She is still being followed by the neurosurgeon who recently gave her a special belt to wear to help her back but this did not help that much.    Patient Active Problem List   Diagnosis Date Noted  . Permanent atrial fibrillation ([place]) 10/16/2014  . Metabolic syndrome 66/59/9357  . History of uterine cancer 12/03/2013  . Chronic low back pain 05/23/2013  . High risk medication use 05/23/2013  . Swelling of limb 05/03/2013  . Hypertension 05/17/2011  . Hyperlipidemia 02/10/2009  . TOBACCO ABUSE 02/10/2009  . 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 Atrial fibrillation ([place]) 02/10/2009   Outpatient Encounter Prescriptions as of 02/01/2016  Medication Sig  . albuterol (PROAIR HFA) 108 (90 Base) MCG/ACT inhaler Inhale 2 puffs into the lungs every 4 (four) hours as needed for wheezing or shortness of breath.  Marland Kitchen albuterol (PROVENTIL) (2.5 MG/3ML) 0.083% nebulizer solution Take 3 mLs (2.5 mg total) by nebulization every 6 (six) hours as needed for wheezing or shortness of breath.  Marland Kitchen amLODipine (NORVASC) 5 MG tablet TAKE ONE TABLET BY MOUTH ONCE DAILY  . atorvastatin (LIPITOR) 80 MG tablet TAKE ONE TABLET BY MOUTH DAILY  . azelastine (ASTELIN) 0.1 % nasal spray USE ONE SPRAY IN EACH NOSTRIL TWICE DAILY  . budesonide (PULMICORT) 0.5 MG/2ML nebulizer solution Take  2 mLs (0.5 mg total) by nebulization 2 (two) times daily.  . Cholecalciferol (VITAMIN D-3) 1000 units CAPS Take 1 capsule by mouth daily.  . cyclobenzaprine (FLEXERIL) 5 MG tablet Take 1 tablet (5 mg total) by mouth 3 (three) times daily as needed for muscle spasms.  . fluticasone (FLONASE) 50 MCG/ACT nasal spray USE 2 SPRAYS IN EACH NOSTRIL ONCE DAILY. SHAKE GENTLY BEFORE USING.  Marland Kitchen loratadine (CLARITIN) 10 MG tablet Take 10 mg by mouth daily.  . Multiple Vitamins-Minerals (CENTRUM SILVER PO) Take 1 tablet by mouth daily.    . NON FORMULARY Oxygen 3L/min   . oxyCODONE-acetaminophen (PERCOCET) 5-325 MG per tablet Take 2 tablets by mouth every 8 (eight) hours as needed.    Marland Kitchen PARoxetine (PAXIL) 40 MG tablet TAKE 1 AND 1/2 TABLETS BY MOUTH DAILY.  Marland Kitchen torsemide (DEMADEX) 20 MG tablet TAKE 1 AND 1/2 TABLETS BY MOUTH ONCE DAILY.  Marland Kitchen warfarin (COUMADIN) 5 MG tablet TAKE 1 TO 2 TABLETS BY MOUTH AS DIRECTED BY ANTICOAGULATION CLINIC.   No facility-administered encounter medications on file as of 02/01/2016.       Review of Systems  Constitutional: Negative.   HENT: Negative.   Eyes: Negative.   Respiratory: Negative.   Cardiovascular: Negative.   Gastrointestinal: Negative.   Endocrine: Negative.   Genitourinary: Negative.   Musculoskeletal: Positive for back pain.  Skin: Negative.   Allergic/Immunologic: Negative.   Neurological: Negative.   Hematological: Negative.   Psychiatric/Behavioral: Negative.  Objective:   Physical Exam  Constitutional: She is oriented to person, place, and time. She appears well-developed and well-nourished. No distress.  HENT:  Head: Normocephalic and atraumatic.  Right Ear: External ear normal.  Left Ear: External ear normal.  Nose: Nose normal.  Mouth/Throat: Oropharynx is clear and moist.  Eyes: Conjunctivae and EOM are normal. Pupils are equal, round, and reactive to light. Right eye exhibits no discharge. Left eye exhibits no discharge. No  scleral icterus.  Neck: Normal range of motion. Neck supple. No thyromegaly present.  Cardiovascular: Normal rate, regular rhythm, normal heart sounds and intact distal pulses.   No murmur heard. The heart was fairly regular today at 84/m  Pulmonary/Chest: Effort normal. She has wheezes. She has no rales. She exhibits no tenderness.  Increased breath sounds bilaterally with scattered wheezes  Abdominal: Soft. Bowel sounds are normal. She exhibits distension. She exhibits no mass. There is tenderness. There is no rebound and no guarding.  Gaseous distention and slight tenderness in the right upper quadrant  Musculoskeletal: She exhibits no edema or tenderness.  Range of motion is limited due to her multiple back surgeries and her chronic back pain.  Lymphadenopathy:    She has no cervical adenopathy.  Neurological: She is alert and oriented to person, place, and time. She has normal reflexes. No cranial nerve deficit.  Skin: Skin is warm and dry. No rash noted.  Psychiatric: She has a normal mood and affect. Her behavior is normal. Judgment and thought content normal.  Despite all the discomfort with breathing and her chronic back pain she is in good spirits and has a positive attitude.  Nursing note and vitals reviewed.  BP 121/75 mmHg  Pulse 100  Temp(Src) 98.3 F (36.8 C) (Oral)  Ht '5\' 4"'  (1.626 m)  Wt 173 lb (78.472 kg)  BMI 29.68 kg/m2        Assessment & Plan:  1. Hyperlipidemia -Continue aggressive therapeutic lifestyle changes and current treatment pending results of lab work - CBC with Differential/Platelet; Future - NMR, lipoprofile; Future  2. Essential hypertension -The blood pressure is good today and she will continue with current treatment - BMP8+EGFR; Future - CBC with Differential/Platelet; Future - Hepatic function panel; Future  3. Permanent atrial fibrillation [facility]) -Follow-up with cardiology as planned - CBC with Differential/Platelet; Future  4.  Chronic low back pain -Continue with pain medicine as prescribed from the neurosurgeon and with his recommended treatments and follow-up with him as needed - CBC with Differential/Platelet; Future  5. Vitamin D deficiency -Continue current treatment - CBC with Differential/Platelet; Future - VITAMIN D 25 Hydroxy (Vit-D Deficiency, Fractures); Future  6. Paroxysmal atrial fibrillation (HCC) -Follow-up with cardiology as planned  7. Pulmonary emphysema, unspecified emphysema type ([place]) -Continue inhalers and use nebulizer treatments with breathing is worse  Patient Instructions                       Medicare Annual Wellness Visit  [facility] and the medical providers at [facility] strive to bring you the best medical care.  In doing so we not only want to address your current medical conditions and concerns but also to detect new conditions early and prevent illness, disease and health-related problems.    Medicare offers a yearly Wellness Visit which allows our clinical staff to assess your need for preventative services including immunizations, lifestyle education, counseling to decrease risk of preventable diseases and screening for fall risk and other medical  concerns.    This visit is provided free of charge (no copay) for all Medicare recipients. The clinical pharmacists at [facility] have begun to conduct these Wellness Visits which will also include a thorough review of all your medications.    As you primary medical provider recommend that you make an appointment for your Annual Wellness Visit if you have not done so already this year.  You may set up this appointment before you leave today or you may call back [phone] and schedule an appointment.  Please make sure when you call that you mention that you are scheduling your Annual Wellness Visit with the clinical pharmacist so that the appointment may be made for the proper length of  time.     Continue current medications. Continue good therapeutic lifestyle changes which include good diet and exercise. Fall precautions discussed with patient. If an FOBT was given today- please return it to our front desk. If you are over 82 years old - you may need Prevnar 63 or the adult Pneumonia vaccine.  **Flu shots are available--- please call and schedule a FLU-CLINIC appointment**  After your visit with [place] today you will receive a survey in the mail or online from [employer] regarding your care with [place]. Please take a moment to fill this out. Your feedback is very important to [place] as you can help [place] better understand your patient needs as well as improve your experience and satisfaction. WE CARE ABOUT YOU!!!   Follow up with cardiology and neurosurgeon Use inhalers as directed.   Arrie Senate MD

## 2016-02-01 NOTE — Patient Instructions (Addendum)
Medicare Annual Wellness Visit  [facility] and the medical providers at [facility] strive to bring you the best medical care.  In doing so we not only want to address your current medical conditions and concerns but also to detect new conditions early and prevent illness, disease and health-related problems.    Medicare offers a yearly Wellness Visit which allows our clinical staff to assess your need for preventative services including immunizations, lifestyle education, counseling to decrease risk of preventable diseases and screening for fall risk and other medical concerns.    This visit is provided free of charge (no copay) for all Medicare recipients. The clinical pharmacists at [facility] have begun to conduct these Wellness Visits which will also include a thorough review of all your medications.    As you primary medical provider recommend that you make an appointment for your Annual Wellness Visit if you have not done so already this year.  You may set up this appointment before you leave today or you may call back [phone] and schedule an appointment.  Please make sure when you call that you mention that you are scheduling your Annual Wellness Visit with the clinical pharmacist so that the appointment may be made for the proper length of time.     Continue current medications. Continue good therapeutic lifestyle changes which include good diet and exercise. Fall precautions discussed with patient. If an FOBT was given today- please return it to our front desk. If you are over 71 years old - you may need Prevnar 34 or the adult Pneumonia vaccine.  **Flu shots are available--- please call and schedule a FLU-CLINIC appointment**  After your visit with [place] today you will receive a survey in the mail or online from [employer] regarding your care with [place]. Please take a moment to fill this out. Your feedback is very  important to [place] as you can help [place] better understand your patient needs as well as improve your experience and satisfaction. WE CARE ABOUT YOU!!!   Follow up with cardiology and neurosurgeon Use inhalers as directed.

## 2016-02-11 DIAGNOSIS — J449 Chronic obstructive pulmonary disease, unspecified: Secondary | ICD-10-CM | POA: Diagnosis not present

## 2016-02-16 ENCOUNTER — Other Ambulatory Visit: Payer: Medicare Other

## 2016-02-16 ENCOUNTER — Ambulatory Visit (INDEPENDENT_AMBULATORY_CARE_PROVIDER_SITE_OTHER): Payer: Medicare Other | Admitting: Pharmacist

## 2016-02-16 DIAGNOSIS — E785 Hyperlipidemia, unspecified: Secondary | ICD-10-CM | POA: Diagnosis not present

## 2016-02-16 DIAGNOSIS — I482 Chronic atrial fibrillation: Secondary | ICD-10-CM

## 2016-02-16 DIAGNOSIS — G8929 Other chronic pain: Secondary | ICD-10-CM | POA: Diagnosis not present

## 2016-02-16 DIAGNOSIS — I10 Essential (primary) hypertension: Secondary | ICD-10-CM

## 2016-02-16 DIAGNOSIS — E559 Vitamin D deficiency, unspecified: Secondary | ICD-10-CM

## 2016-02-16 DIAGNOSIS — M545 Low back pain, unspecified: Secondary | ICD-10-CM

## 2016-02-16 DIAGNOSIS — I4821 Permanent atrial fibrillation: Secondary | ICD-10-CM

## 2016-02-16 LAB — COAGUCHEK XS/INR WAIVED
INR: 2.3 — ABNORMAL HIGH (ref 0.9–1.1)
Prothrombin Time: 27.3 s

## 2016-02-16 NOTE — Progress Notes (Signed)
protime only - see notes

## 2016-02-17 LAB — HEPATIC FUNCTION PANEL
ALK PHOS: 92 IU/L (ref 39–117)
ALT: 22 IU/L (ref 0–32)
AST: 24 IU/L (ref 0–40)
Albumin: 3.7 g/dL (ref 3.5–4.8)
BILIRUBIN, DIRECT: 0.13 mg/dL (ref 0.00–0.40)
Bilirubin Total: 0.3 mg/dL (ref 0.0–1.2)
Total Protein: 5.9 g/dL — ABNORMAL LOW (ref 6.0–8.5)

## 2016-02-17 LAB — NMR, LIPOPROFILE
Cholesterol: 145 mg/dL (ref 100–199)
HDL CHOLESTEROL BY NMR: 51 mg/dL (ref >39)
HDL Particle Number: 38 umol/L (ref >=30.5)
LDL PARTICLE NUMBER: 992 nmol/L (ref <1000)
LDL Size: 20.1 nm (ref >20.5)
LDL-C: 34 mg/dL (ref 0–99)
LP-IR Score: 46 — ABNORMAL HIGH (ref <=45)
Small LDL Particle Number: 656 nmol/L — ABNORMAL HIGH (ref <=527)
TRIGLYCERIDES BY NMR: 302 mg/dL — AB (ref 0–149)

## 2016-02-17 LAB — CBC WITH DIFFERENTIAL/PLATELET
BASOS: 1 %
Basophils Absolute: 0 10*3/uL (ref 0.0–0.2)
EOS (ABSOLUTE): 0.2 10*3/uL (ref 0.0–0.4)
EOS: 2 %
HEMATOCRIT: 41.4 % (ref 34.0–46.6)
HEMOGLOBIN: 13 g/dL (ref 11.1–15.9)
Immature Grans (Abs): 0 10*3/uL (ref 0.0–0.1)
Immature Granulocytes: 0 %
LYMPHS ABS: 1.5 10*3/uL (ref 0.7–3.1)
Lymphs: 22 %
MCH: 29 pg (ref 26.6–33.0)
MCHC: 31.4 g/dL — AB (ref 31.5–35.7)
MCV: 92 fL (ref 79–97)
MONOCYTES: 7 %
Monocytes Absolute: 0.4 10*3/uL (ref 0.1–0.9)
NEUTROS ABS: 4.7 10*3/uL (ref 1.4–7.0)
Neutrophils: 68 %
Platelets: 183 10*3/uL (ref 150–379)
RBC: 4.49 x10E6/uL (ref 3.77–5.28)
RDW: 15.9 % — ABNORMAL HIGH (ref 12.3–15.4)
WBC: 6.8 10*3/uL (ref 3.4–10.8)

## 2016-02-17 LAB — BMP8+EGFR
BUN / CREAT RATIO: 14 (ref 12–28)
BUN: 15 mg/dL (ref 8–27)
CHLORIDE: 99 mmol/L (ref 96–106)
CO2: 31 mmol/L — ABNORMAL HIGH (ref 18–29)
CREATININE: 1.08 mg/dL — AB (ref 0.57–1.00)
Calcium: 8.5 mg/dL — ABNORMAL LOW (ref 8.7–10.3)
GFR, EST AFRICAN AMERICAN: 59 mL/min/{1.73_m2} — AB (ref >59)
GFR, EST NON AFRICAN AMERICAN: 51 mL/min/{1.73_m2} — AB (ref >59)
Glucose: 116 mg/dL — ABNORMAL HIGH (ref 65–99)
Potassium: 3.2 mmol/L — ABNORMAL LOW (ref 3.5–5.2)
Sodium: 146 mmol/L — ABNORMAL HIGH (ref 134–144)

## 2016-02-17 LAB — VITAMIN D 25 HYDROXY (VIT D DEFICIENCY, FRACTURES): VIT D 25 HYDROXY: 24.9 ng/mL — AB (ref 30.0–100.0)

## 2016-02-17 MED ORDER — POTASSIUM CHLORIDE ER 10 MEQ PO TBCR: 10.0000 meq | EXTENDED_RELEASE_TABLET | Freq: Every day | ORAL | 0 refills | Status: DC

## 2016-03-01 DIAGNOSIS — J449 Chronic obstructive pulmonary disease, unspecified: Secondary | ICD-10-CM | POA: Diagnosis not present

## 2016-03-07 ENCOUNTER — Encounter: Payer: Self-pay | Admitting: Pharmacist

## 2016-03-15 DIAGNOSIS — M4716 Other spondylosis with myelopathy, lumbar region: Secondary | ICD-10-CM | POA: Diagnosis not present

## 2016-03-16 ENCOUNTER — Other Ambulatory Visit: Payer: Self-pay | Admitting: Family Medicine

## 2016-03-22 ENCOUNTER — Ambulatory Visit (INDEPENDENT_AMBULATORY_CARE_PROVIDER_SITE_OTHER): Payer: Medicare Other | Admitting: Pharmacist

## 2016-03-22 DIAGNOSIS — I482 Chronic atrial fibrillation: Secondary | ICD-10-CM | POA: Diagnosis not present

## 2016-03-22 DIAGNOSIS — I4821 Permanent atrial fibrillation: Secondary | ICD-10-CM

## 2016-03-22 LAB — COAGUCHEK XS/INR WAIVED
INR: 2.6 — AB (ref 0.9–1.1)
Prothrombin Time: 31.1 s

## 2016-03-29 DIAGNOSIS — J449 Chronic obstructive pulmonary disease, unspecified: Secondary | ICD-10-CM | POA: Diagnosis not present

## 2016-04-08 ENCOUNTER — Other Ambulatory Visit: Payer: Self-pay | Admitting: Family Medicine

## 2016-04-21 ENCOUNTER — Ambulatory Visit (INDEPENDENT_AMBULATORY_CARE_PROVIDER_SITE_OTHER): Payer: Medicare Other | Admitting: Pharmacist

## 2016-04-21 DIAGNOSIS — I482 Chronic atrial fibrillation: Secondary | ICD-10-CM

## 2016-04-21 DIAGNOSIS — Z7901 Long term (current) use of anticoagulants: Secondary | ICD-10-CM

## 2016-04-21 DIAGNOSIS — I4821 Permanent atrial fibrillation: Secondary | ICD-10-CM

## 2016-04-21 LAB — COAGUCHEK XS/INR WAIVED
INR: 2.8 — ABNORMAL HIGH (ref 0.9–1.1)
PROTHROMBIN TIME: 33.4 s

## 2016-04-21 MED ORDER — ALBUTEROL SULFATE HFA 108 (90 BASE) MCG/ACT IN AERS: INHALATION_SPRAY | RESPIRATORY_TRACT | 0 refills | Status: DC

## 2016-04-22 ENCOUNTER — Ambulatory Visit (INDEPENDENT_AMBULATORY_CARE_PROVIDER_SITE_OTHER): Payer: Medicare Other | Admitting: Family Medicine

## 2016-04-22 ENCOUNTER — Encounter: Payer: Self-pay | Admitting: Family Medicine

## 2016-04-22 ENCOUNTER — Ambulatory Visit (INDEPENDENT_AMBULATORY_CARE_PROVIDER_SITE_OTHER): Payer: Medicare Other

## 2016-04-22 VITALS — BP 130/84 | HR 110 | Temp 97.4°F | Ht 64.0 in | Wt 170.0 lb

## 2016-04-22 DIAGNOSIS — I482 Chronic atrial fibrillation, unspecified: Secondary | ICD-10-CM

## 2016-04-22 DIAGNOSIS — R142 Eructation: Secondary | ICD-10-CM

## 2016-04-22 DIAGNOSIS — R0602 Shortness of breath: Secondary | ICD-10-CM

## 2016-04-22 DIAGNOSIS — R05 Cough: Secondary | ICD-10-CM | POA: Diagnosis not present

## 2016-04-22 DIAGNOSIS — C55 Malignant neoplasm of uterus, part unspecified: Secondary | ICD-10-CM | POA: Diagnosis not present

## 2016-04-22 DIAGNOSIS — J439 Emphysema, unspecified: Secondary | ICD-10-CM | POA: Diagnosis not present

## 2016-04-22 DIAGNOSIS — I10 Essential (primary) hypertension: Secondary | ICD-10-CM

## 2016-04-22 DIAGNOSIS — R140 Abdominal distension (gaseous): Secondary | ICD-10-CM

## 2016-04-22 DIAGNOSIS — I700 Atherosclerosis of aorta: Secondary | ICD-10-CM | POA: Insufficient documentation

## 2016-04-22 DIAGNOSIS — F17200 Nicotine dependence, unspecified, uncomplicated: Secondary | ICD-10-CM

## 2016-04-22 DIAGNOSIS — R059 Cough, unspecified: Secondary | ICD-10-CM

## 2016-04-22 MED ORDER — METHYLPREDNISOLONE ACETATE 80 MG/ML IJ SUSP
60.0000 mg | Freq: Once | INTRAMUSCULAR | Status: AC
  Administered 2016-04-22: 60 mg via INTRAMUSCULAR

## 2016-04-22 MED ORDER — PREDNISONE 10 MG PO TABS: ORAL_TABLET | ORAL | 0 refills | Status: DC

## 2016-04-22 NOTE — Patient Instructions (Addendum)
Take the prednisone as directed Use inhaler as directed and use albuterol inhaler as needed are nebulizer as needed for shortness of breath Take Mucinex maximum strength 1 twice a day with a large glass of water Take ranitidine 150 mg, the equate brand 1 twice daily before breakfast and supper Repeat pro time next Tuesday when she returns for recheck

## 2016-04-22 NOTE — Progress Notes (Signed)
Subjective:    Patient ID: Melinda Collins, female    DOB: [birth year], 75 y.o.   MRN: [id number]  HPI Patient here today for Sob, slight cough and she also complains of bloating and belching. The patient has been having more trouble with her breathing. Her pulse ox today when she came in was 95%. She has a slight cough with some congestion and this is been going on for about 3 weeks. She does not describe any fever. She has minimal sputum production and when she coughs a lot and may be slightly yellow. Urology nebulizer and an albuterol inhaler has been using these quite frequently. She is also had bloating and belching for about a month with some constipation and she does take a stool softener for this and has a regimen for keeping her bowels working well. Does drink water. She does not eat any more salt than usual. She does not add any salt to the food once he gets to the table. She's been off the cigarettes for 2 months. She is passing her water without problems.    Patient Active Problem List   Diagnosis Date Noted  . Permanent atrial fibrillation ([place]) 10/16/2014  . Metabolic syndrome 41/09/129  . History of uterine cancer 12/03/2013  . Chronic low back pain 05/23/2013  . High risk medication use 05/23/2013  . Swelling of limb 05/03/2013  . Hypertension 05/17/2011  . Hyperlipidemia 02/10/2009  . TOBACCO ABUSE 02/10/2009  . 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 Atrial fibrillation ([place]) 02/10/2009   Outpatient Encounter Prescriptions as of 04/22/2016  Medication Sig  . albuterol (PROAIR HFA) 108 (90 Base) MCG/ACT inhaler Take 1 to 2 puffs every 4 to 6 hours as needed for SOB  . albuterol (PROVENTIL) (2.5 MG/3ML) 0.083% nebulizer solution Take 3 mLs (2.5 mg total) by nebulization every 6 (six) hours as needed for wheezing or shortness of breath.  Marland Kitchen amLODipine (NORVASC) 5 MG tablet TAKE ONE TABLET BY MOUTH ONCE DAILY  . atorvastatin (LIPITOR) 80 MG tablet TAKE ONE TABLET BY MOUTH  DAILY  . azelastine (ASTELIN) 0.1 % nasal spray USE ONE SPRAY IN EACH NOSTRIL TWICE DAILY  . budesonide (PULMICORT) 0.5 MG/2ML nebulizer solution Take 2 mLs (0.5 mg total) by nebulization 2 (two) times daily.  . Cholecalciferol (VITAMIN D-3) 1000 units CAPS Take 1 capsule by mouth daily.  . cyclobenzaprine (FLEXERIL) 5 MG tablet Take 1 tablet (5 mg total) by mouth 3 (three) times daily as needed for muscle spasms.  . fluticasone (FLONASE) 50 MCG/ACT nasal spray USE 2 SPRAYS IN EACH NOSTRIL ONCE DAILY. SHAKE GENTLY BEFORE USING.  Marland Kitchen loratadine (CLARITIN) 10 MG tablet Take 10 mg by mouth daily.  . Multiple Vitamins-Minerals (CENTRUM SILVER PO) Take 1 tablet by mouth daily.    . NON FORMULARY Oxygen 3L/min   . oxyCODONE-acetaminophen (PERCOCET) 5-325 MG per tablet Take 2 tablets by mouth every 8 (eight) hours as needed.    Marland Kitchen PARoxetine (PAXIL) 40 MG tablet TAKE 1 AND 1/2 TABLETS BY MOUTH DAILY.  Marland Kitchen potassium chloride (K-DUR,KLOR-CON) 10 MEQ tablet TAKE ONE TABLET BY MOUTH DAILY.  Marland Kitchen torsemide (DEMADEX) 20 MG tablet TAKE 1 AND 1/2 TABLETS BY MOUTH ONCE DAILY.  Marland Kitchen warfarin (COUMADIN) 5 MG tablet TAKE 1 TO 2 TABLETS BY MOUTH AS DIRECTED BY ANTICOAGULATION CLINIC.   No facility-administered encounter medications on file as of 04/22/2016.       Review of Systems  Constitutional: Negative.   HENT: Positive for congestion.   Eyes: Negative.  Respiratory: Positive for cough and shortness of breath.   Cardiovascular: Negative.   Gastrointestinal: Negative.        Belching and bloating  Endocrine: Negative.   Genitourinary: Negative.   Musculoskeletal: Negative.   Skin: Negative.   Allergic/Immunologic: Negative.   Neurological: Negative.   Hematological: Negative.   Psychiatric/Behavioral: Negative.        Objective:   Physical Exam  Constitutional: She is oriented to person, place, and time. She appears well-developed and well-nourished. She appears distressed.  HENT:  Head: Normocephalic  and atraumatic.  Right Ear: External ear normal.  Left Ear: External ear normal.  Nose: Nose normal.  Mouth/Throat: Oropharynx is clear and moist.  Eyes: Conjunctivae and EOM are normal. Pupils are equal, round, and reactive to light. Right eye exhibits no discharge. Left eye exhibits no discharge. No scleral icterus.  Neck: Normal range of motion. Neck supple. No thyromegaly present.  No bruits or thyromegaly or anterior cervical adenopathy  Cardiovascular: Normal rate and normal heart sounds.  Exam reveals no gallop and no friction rub.   No murmur heard. The heart is slightly irregular at 96/m  Pulmonary/Chest: Effort normal. No respiratory distress. She has wheezes. She has no rales.  Diminished breath sounds and tight congested cough with scattered wheezes  Abdominal: Soft. Bowel sounds are normal. She exhibits no mass. There is tenderness. There is no rebound and no guarding.  Generalized tenderness across the upper abdomen with quiet bowel sounds and no masses or organ enlargement  Musculoskeletal: Normal range of motion. She exhibits no edema.  The patient continues to have problems with her back.  Lymphadenopathy:    She has no cervical adenopathy.  Neurological: She is alert and oriented to person, place, and time. She has normal reflexes. No cranial nerve deficit.  Skin: Skin is warm and dry. No rash noted.  Psychiatric: She has a normal mood and affect. Her behavior is normal. Judgment and thought content normal.  Nursing note and vitals reviewed.  BP 130/84 (BP Location: Right Arm)   Pulse (!) 110   Temp 97.4 F (36.3 C) (Oral)   Ht '5\' 4"'  (1.626 m)   Wt 170 lb (77.1 kg)   SpO2 95%   BMI 29.18 kg/m   [facility] reading (PRIMARY) by  Dr.Moore-chest x-ray with results pending  Screening CT of the chest CT scan of abdomen and pelvis                                        Assessment & Plan:  1. Essential hypertension -The blood pressure is good today and no change in  treatment - DG Chest 2 View; Future - CT CHEST LUNG CA SCREEN LOW DOSE W/O CM; Future - BMP8+EGFR - CBC with Differential/Platelet  2. Chronic atrial fibrillation (HCC) -She remains in atrial fibrillation she will continue with Coumadin and current treatment - DG Chest 2 View; Future - CT CHEST LUNG CA SCREEN LOW DOSE W/O CM; Future - CBC with Differential/Platelet  3. Malignant neoplasm of uterus, unspecified site ([place]) - CBC with Differential/Platelet -Get CT scan of abdomen and pelvis  4. Pulmonary emphysema, unspecified emphysema type ([place]) - CBC with Differential/Platelet - methylPREDNISolone acetate (DEPO-MEDROL) injection 60 mg; Inject 0.75 mLs (60 mg total) into the muscle once.  5. SOB (shortness of breath) - CT CHEST LUNG CA SCREEN LOW DOSE W/O CM; Future - BMP8+EGFR - CBC  with Differential/Platelet - methylPREDNISolone acetate (DEPO-MEDROL) injection 60 mg; Inject 0.75 mLs (60 mg total) into the muscle once.  6. Smoker - CT CHEST LUNG CA SCREEN LOW DOSE W/O CM; Future - CBC with Differential/Platelet  7. Bloating -Continue to drink plenty of fluids and stay well hydrated - CT Abdomen Pelvis Wo Contrast; Future - CBC with Differential/Platelet - Hepatic function panel  8. Belching -Take ranitidine 150 mg one twice daily before breakfast and supper - CT Abdomen Pelvis Wo Contrast; Future - CBC with Differential/Platelet - Hepatic function panel  9. Cough -Take Mucinex maximum strength 1 twice daily with a large glass of water - methylPREDNISolone acetate (DEPO-MEDROL) injection 60 mg; Inject 0.75 mLs (60 mg total) into the muscle once.  Meds ordered this encounter  Medications  . predniSONE (DELTASONE) 10 MG tablet    Sig: Take 1 tab QID x 2 days, then 1 tab TID x 2 days, then 1 tab BID x 2 days, 1 tab QD x 2 days    Dispense:  20 tablet    Refill:  0  . methylPREDNISolone acetate (DEPO-MEDROL) injection 60 mg   Patient Instructions  Take the prednisone  as directed Use inhaler as directed and use albuterol inhaler as needed are nebulizer as needed for shortness of breath Take Mucinex maximum strength 1 twice a day with a large glass of water Take ranitidine 150 mg, the equate brand 1 twice daily before breakfast and supper Repeat pro time next Tuesday when she returns for recheck  Arrie Senate MD

## 2016-04-23 LAB — CBC WITH DIFFERENTIAL/PLATELET
BASOS: 0 %
Basophils Absolute: 0 10*3/uL (ref 0.0–0.2)
EOS (ABSOLUTE): 0.2 10*3/uL (ref 0.0–0.4)
EOS: 3 %
HEMATOCRIT: 38 % (ref 34.0–46.6)
HEMOGLOBIN: 12.1 g/dL (ref 11.1–15.9)
IMMATURE GRANS (ABS): 0 10*3/uL (ref 0.0–0.1)
IMMATURE GRANULOCYTES: 0 %
LYMPHS: 19 %
Lymphocytes Absolute: 1.5 10*3/uL (ref 0.7–3.1)
MCH: 28.9 pg (ref 26.6–33.0)
MCHC: 31.8 g/dL (ref 31.5–35.7)
MCV: 91 fL (ref 79–97)
Monocytes Absolute: 0.6 10*3/uL (ref 0.1–0.9)
Monocytes: 7 %
NEUTROS ABS: 5.5 10*3/uL (ref 1.4–7.0)
NEUTROS PCT: 71 %
Platelets: 193 10*3/uL (ref 150–379)
RBC: 4.18 x10E6/uL (ref 3.77–5.28)
RDW: 16.5 % — ABNORMAL HIGH (ref 12.3–15.4)
WBC: 7.7 10*3/uL (ref 3.4–10.8)

## 2016-04-23 LAB — HEPATIC FUNCTION PANEL
ALK PHOS: 105 IU/L (ref 39–117)
ALT: 21 IU/L (ref 0–32)
AST: 23 IU/L (ref 0–40)
Albumin: 3.7 g/dL (ref 3.5–4.8)
BILIRUBIN TOTAL: 0.4 mg/dL (ref 0.0–1.2)
BILIRUBIN, DIRECT: 0.13 mg/dL (ref 0.00–0.40)
Total Protein: 5.6 g/dL — ABNORMAL LOW (ref 6.0–8.5)

## 2016-04-23 LAB — BMP8+EGFR
BUN/Creatinine Ratio: 11 — ABNORMAL LOW (ref 12–28)
BUN: 13 mg/dL (ref 8–27)
CALCIUM: 8.5 mg/dL — AB (ref 8.7–10.3)
CO2: 30 mmol/L — AB (ref 18–29)
CREATININE: 1.22 mg/dL — AB (ref 0.57–1.00)
Chloride: 102 mmol/L (ref 96–106)
GFR, EST AFRICAN AMERICAN: 51 mL/min/{1.73_m2} — AB (ref >59)
GFR, EST NON AFRICAN AMERICAN: 44 mL/min/{1.73_m2} — AB (ref >59)
Glucose: 83 mg/dL (ref 65–99)
Potassium: 3.7 mmol/L (ref 3.5–5.2)
Sodium: 147 mmol/L — ABNORMAL HIGH (ref 134–144)

## 2016-04-26 ENCOUNTER — Encounter: Payer: Self-pay | Admitting: Family Medicine

## 2016-04-26 ENCOUNTER — Ambulatory Visit (INDEPENDENT_AMBULATORY_CARE_PROVIDER_SITE_OTHER): Payer: Medicare Other | Admitting: Family Medicine

## 2016-04-26 VITALS — BP 134/83 | HR 115 | Temp 98.6°F | Resp 24 | Ht 64.0 in | Wt 170.0 lb

## 2016-04-26 DIAGNOSIS — R0602 Shortness of breath: Secondary | ICD-10-CM

## 2016-04-26 DIAGNOSIS — R140 Abdominal distension (gaseous): Secondary | ICD-10-CM | POA: Diagnosis not present

## 2016-04-26 DIAGNOSIS — I482 Chronic atrial fibrillation, unspecified: Secondary | ICD-10-CM

## 2016-04-26 DIAGNOSIS — R142 Eructation: Secondary | ICD-10-CM | POA: Diagnosis not present

## 2016-04-26 LAB — COAGUCHEK XS/INR WAIVED
INR: 2.7 — ABNORMAL HIGH (ref 0.9–1.1)
Prothrombin Time: 32.8 s

## 2016-04-26 MED ORDER — METHYLPREDNISOLONE ACETATE 80 MG/ML IJ SUSP
80.0000 mg | Freq: Once | INTRAMUSCULAR | Status: AC
  Administered 2016-04-26: 80 mg via INTRAMUSCULAR

## 2016-04-26 MED ORDER — PREDNISONE 10 MG PO TABS: ORAL_TABLET | ORAL | 0 refills | Status: DC

## 2016-04-26 NOTE — Progress Notes (Signed)
Subjective:    Patient ID: Melinda Collins, female    DOB: 06-24-1941, 75 y.o.   MRN: [id number]  HPI Patient here today for 1 week follow up on SOB and bloating. The patient has not had her CT scan of the abdomen and pelvis or chest at this point. Were going to try to push that to get this done as soon as possible. Recent CBC had a normal white blood cell count. Hemoglobin was good at 12.1. Her previous hemoglobins have been running in the 13-14 range. The platelet count was adequate. The BMP had a creatinine that was also slightly elevated at 1.2 tube but this is lower than it was over 1 year ago but higher than it was at the last visit. The electrolytes including potassium are good except for serum sodium that was slightly elevated at 147. Liver function tests were within normal limits. The patient complains of more shortness of breath. Her pulse ox was 92-94%. She is using the nebulizer treatment at home but only 2 or 3 times weekly. I encouraged her to do this more frequently over the next few days. The patient denies any blood in the stool or black tarry bowel movements. She has not had any nausea or vomiting and has no complaints with passing her water. The biggest complaint is just the shortness of breath and this is coupled with the atrial fibrillation that she has a history.   Patient Active Problem List   Diagnosis Date Noted  . Aortic atherosclerosis ([place]) 04/22/2016  . Permanent atrial fibrillation ([place]) 10/16/2014  . Metabolic syndrome 88/41/6606  . History of uterine cancer 12/03/2013  . Chronic low back pain 05/23/2013  . High risk medication use 05/23/2013  . Swelling of limb 05/03/2013  . Hypertension 05/17/2011  . Hyperlipidemia 02/10/2009  . TOBACCO ABUSE 02/10/2009  . 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 Atrial fibrillation ([place]) 02/10/2009   Outpatient Encounter Prescriptions as of 04/26/2016  Medication Sig  . albuterol (PROAIR HFA) 108 (90 Base) MCG/ACT inhaler  Take 1 to 2 puffs every 4 to 6 hours as needed for SOB  . albuterol (PROVENTIL) (2.5 MG/3ML) 0.083% nebulizer solution Take 3 mLs (2.5 mg total) by nebulization every 6 (six) hours as needed for wheezing or shortness of breath.  Marland Kitchen amLODipine (NORVASC) 5 MG tablet TAKE ONE TABLET BY MOUTH ONCE DAILY  . atorvastatin (LIPITOR) 80 MG tablet TAKE ONE TABLET BY MOUTH DAILY  . azelastine (ASTELIN) 0.1 % nasal spray USE ONE SPRAY IN EACH NOSTRIL TWICE DAILY  . budesonide (PULMICORT) 0.5 MG/2ML nebulizer solution Take 2 mLs (0.5 mg total) by nebulization 2 (two) times daily.  . Cholecalciferol (VITAMIN D-3) 1000 units CAPS Take 1 capsule by mouth daily.  Marland Kitchen loratadine (CLARITIN) 10 MG tablet Take 10 mg by mouth daily.  . Multiple Vitamins-Minerals (CENTRUM SILVER PO) Take 1 tablet by mouth daily.    . NON FORMULARY Oxygen 3L/min   . oxyCODONE-acetaminophen (PERCOCET) 5-325 MG per tablet Take 2 tablets by mouth every 8 (eight) hours as needed.    . potassium chloride (K-DUR,KLOR-CON) 10 MEQ tablet TAKE ONE TABLET BY MOUTH DAILY.  Marland Kitchen torsemide (DEMADEX) 20 MG tablet TAKE 1 AND 1/2 TABLETS BY MOUTH ONCE DAILY.  Marland Kitchen warfarin (COUMADIN) 5 MG tablet TAKE 1 TO 2 TABLETS BY MOUTH AS DIRECTED BY ANTICOAGULATION CLINIC.  Marland Kitchen cyclobenzaprine (FLEXERIL) 5 MG tablet Take 1 tablet (5 mg total) by mouth 3 (three) times daily as needed for muscle spasms. (Patient not taking:  Reported on 04/26/2016)  . fluticasone (FLONASE) 50 MCG/ACT nasal spray USE 2 SPRAYS IN EACH NOSTRIL ONCE DAILY. SHAKE GENTLY BEFORE USING. (Patient not taking: Reported on 04/26/2016)  . PARoxetine (PAXIL) 40 MG tablet TAKE 1 AND 1/2 TABLETS BY MOUTH DAILY. (Patient not taking: Reported on 04/26/2016)  . predniSONE (DELTASONE) 10 MG tablet Take 1 tab QID x 2 days, then 1 tab TID x 2 days, then 1 tab BID x 2 days, 1 tab QD x 2 days (Patient not taking: Reported on 04/26/2016)   No facility-administered encounter medications on file as of 04/26/2016.        Review of Systems  Constitutional: Negative.   HENT: Negative.   Eyes: Negative.   Respiratory: Positive for shortness of breath.   Cardiovascular: Negative.   Gastrointestinal: Positive for abdominal distention (still has bloating).  Endocrine: Negative.   Genitourinary: Negative.   Musculoskeletal: Negative.   Skin: Negative.   Allergic/Immunologic: Negative.   Neurological: Negative.   Hematological: Negative.   Psychiatric/Behavioral: Negative.        Objective:   Physical Exam  Constitutional: She is oriented to person, place, and time. She appears well-developed and well-nourished. No distress.  The patient said she was very short of breath this morning. Her recent chest x-ray was reviewed and this was reviewed with her and she is given a copy of this report.  HENT:  Head: Normocephalic and atraumatic.  Eyes: Conjunctivae and EOM are normal. Pupils are equal, round, and reactive to light. Right eye exhibits no discharge. Left eye exhibits no discharge. No scleral icterus.  Neck: Normal range of motion. Neck supple. No thyromegaly present.  Cardiovascular: Normal rate and normal heart sounds.   No murmur heard. The heart was irregular irregular at 84/m  Pulmonary/Chest: Effort normal. No respiratory distress. She has wheezes. She has no rales. She exhibits no tenderness.  Decrease breath sounds bilaterally and tight congestion with coughing  Abdominal: Soft. Bowel sounds are normal. She exhibits distension. She exhibits no mass. There is no tenderness. There is no rebound and no guarding.  Musculoskeletal: Normal range of motion. She exhibits no edema.  Lymphadenopathy:    She has no cervical adenopathy.  Neurological: She is alert and oriented to person, place, and time.  Skin: Skin is warm and dry. No rash noted.  Psychiatric: She has a normal mood and affect. Her behavior is normal. Judgment and thought content normal.  Nursing note and vitals reviewed.   BP  134/83 (BP Location: Right Arm, Patient Position: Sitting, Cuff Size: Normal)   Pulse (!) 115   Temp 98.6 F (37 C) (Oral)   Resp (!) 24   Ht 5\' 4"  (1.626 m)   Wt 170 lb (77.1 kg)   SpO2 92%   BMI 29.18 kg/m        Assessment & Plan:  1. Chronic atrial fibrillation (HCC) -The patient continues to have her atrial fibrillation at about 84/m. - CoaguChek XS/INR Waived  2. SOB (shortness of breath) -Her initial pulse ox was 92-94% and she's been on some O2 sat she has been in the office and feeling better. - methylPREDNISolone acetate (DEPO-MEDROL) injection 80 mg; Inject 1 mL (80 mg total) into the muscle once. -We will repeat the prednisone taper and use a 3 day taper which will last for 12 days. We will also give her 80 mg of Depo-Medrol today.  3. Bloating -She continues to have the bloating and fullness in her belly. She has a history  of the endometrial cancer in the past. She says her bowel movements have been good and normal and she's not seen any blood in the stool.  4. Belching -This is an ongoing problem also and we are trying to get the CT scans of her chest abdomen and pelvis as soon as possible.  Meds ordered this encounter  Medications  . methylPREDNISolone acetate (DEPO-MEDROL) injection 80 mg  . predniSONE (DELTASONE) 10 MG tablet    Sig: Take 1 tab QID x 3 days, then 1 tab TID x 3 days, then 1 tab BID x 3 days, then 1 tab QD x 3 days.    Dispense:  30 tablet    Refill:  0   Patient Instructions  Were still trying to get you scheduled for a high resolution CT of the chest and the CT of the abdomen and pelvis Take the prednisone as directed Use your nebulizer treatment at least every 6-8 hours We will call you with the results of the CT scans as soon as those results become available  Arrie Senate MD

## 2016-04-26 NOTE — Patient Instructions (Signed)
Were still trying to get you scheduled for a high resolution CT of the chest and the CT of the abdomen and pelvis Take the prednisone as directed Use your nebulizer treatment at least every 6-8 hours We will call you with the results of the CT scans as soon as those results become available

## 2016-05-03 ENCOUNTER — Ambulatory Visit (HOSPITAL_COMMUNITY)
Admission: RE | Admit: 2016-05-03 | Discharge: 2016-05-03 | Disposition: A | Discharge status: Home / Self Care | Payer: Medicare Other | Source: Ambulatory Visit | Attending: Family Medicine | Admitting: Family Medicine

## 2016-05-03 DIAGNOSIS — K5730 Diverticulosis of large intestine without perforation or abscess without bleeding: Secondary | ICD-10-CM | POA: Insufficient documentation

## 2016-05-03 DIAGNOSIS — I10 Essential (primary) hypertension: Secondary | ICD-10-CM | POA: Diagnosis present

## 2016-05-03 DIAGNOSIS — Z9889 Other specified postprocedural states: Secondary | ICD-10-CM | POA: Diagnosis not present

## 2016-05-03 DIAGNOSIS — I2510 Atherosclerotic heart disease of native coronary artery without angina pectoris: Secondary | ICD-10-CM | POA: Diagnosis not present

## 2016-05-03 DIAGNOSIS — R0602 Shortness of breath: Secondary | ICD-10-CM

## 2016-05-03 DIAGNOSIS — R142 Eructation: Secondary | ICD-10-CM

## 2016-05-03 DIAGNOSIS — R938 Abnormal findings on diagnostic imaging of other specified body structures: Secondary | ICD-10-CM | POA: Diagnosis not present

## 2016-05-03 DIAGNOSIS — R918 Other nonspecific abnormal finding of lung field: Secondary | ICD-10-CM | POA: Insufficient documentation

## 2016-05-03 DIAGNOSIS — I714 Abdominal aortic aneurysm, without rupture: Secondary | ICD-10-CM | POA: Insufficient documentation

## 2016-05-03 DIAGNOSIS — R140 Abdominal distension (gaseous): Secondary | ICD-10-CM | POA: Diagnosis present

## 2016-05-03 DIAGNOSIS — N281 Cyst of kidney, acquired: Secondary | ICD-10-CM | POA: Diagnosis not present

## 2016-05-03 DIAGNOSIS — D3502 Benign neoplasm of left adrenal gland: Secondary | ICD-10-CM | POA: Diagnosis not present

## 2016-05-03 DIAGNOSIS — D3501 Benign neoplasm of right adrenal gland: Secondary | ICD-10-CM | POA: Diagnosis not present

## 2016-05-03 DIAGNOSIS — Z90710 Acquired absence of both cervix and uterus: Secondary | ICD-10-CM | POA: Insufficient documentation

## 2016-05-03 DIAGNOSIS — I482 Chronic atrial fibrillation, unspecified: Secondary | ICD-10-CM

## 2016-05-03 DIAGNOSIS — F17200 Nicotine dependence, unspecified, uncomplicated: Secondary | ICD-10-CM

## 2016-05-04 ENCOUNTER — Ambulatory Visit (INDEPENDENT_AMBULATORY_CARE_PROVIDER_SITE_OTHER): Payer: Medicare Other | Admitting: Family Medicine

## 2016-05-04 ENCOUNTER — Other Ambulatory Visit: Payer: Self-pay | Admitting: Family Medicine

## 2016-05-04 ENCOUNTER — Encounter: Payer: Self-pay | Admitting: Cardiology

## 2016-05-04 ENCOUNTER — Encounter: Payer: Self-pay | Admitting: Family Medicine

## 2016-05-04 ENCOUNTER — Ambulatory Visit (INDEPENDENT_AMBULATORY_CARE_PROVIDER_SITE_OTHER): Payer: Medicare Other | Admitting: Cardiology

## 2016-05-04 VITALS — BP 161/62 | HR 100 | Temp 97.0°F | Resp 22 | Ht 64.0 in | Wt 170.0 lb

## 2016-05-04 VITALS — BP 108/80 | HR 78 | Ht 64.0 in | Wt 168.0 lb

## 2016-05-04 DIAGNOSIS — R0602 Shortness of breath: Secondary | ICD-10-CM

## 2016-05-04 DIAGNOSIS — R140 Abdominal distension (gaseous): Secondary | ICD-10-CM | POA: Diagnosis not present

## 2016-05-04 DIAGNOSIS — I482 Chronic atrial fibrillation, unspecified: Secondary | ICD-10-CM

## 2016-05-04 DIAGNOSIS — R7989 Other specified abnormal findings of blood chemistry: Secondary | ICD-10-CM | POA: Diagnosis not present

## 2016-05-04 DIAGNOSIS — R1084 Generalized abdominal pain: Secondary | ICD-10-CM

## 2016-05-04 DIAGNOSIS — I4821 Permanent atrial fibrillation: Secondary | ICD-10-CM

## 2016-05-04 LAB — COAGUCHEK XS/INR WAIVED
INR: 2.7 — ABNORMAL HIGH (ref 0.9–1.1)
Prothrombin Time: 31.9 s

## 2016-05-04 MED ORDER — DILTIAZEM HCL 30 MG PO TABS
30.0000 mg | ORAL_TABLET | Freq: Two times a day (BID) | ORAL | 6 refills | Status: DC
Start: 2016-05-04 — End: 2016-10-31

## 2016-05-04 MED ORDER — LEVOFLOXACIN 250 MG PO TABS: 250.0000 mg | ORAL_TABLET | Freq: Every day | ORAL | 0 refills | Status: DC

## 2016-05-04 NOTE — Progress Notes (Addendum)
Subjective:    Patient ID: Melinda Collins, female    DOB: 06-04-1941, 75 y.o.   MRN: [id number]  HPI  Pt is here to recheck shortness of breath.The patient continues to have shortness of breath and atrial fibrillation she denies any chest pain. She does not feel good and the she is using oxygen. She is coughing up some green sputum today. She is finished taking her prednisone and did receive a shot of Depo-Medrol at her last visit. She denies any other symptoms other than those that are respiratory in nature. She's not seen a pulmonologist in a good while. The CT scan that was done of the abdomen and pelvis basically showed some pulmonary nodules bilaterally and adrenal adenoma. It also showed coronary artery disease and atherosclerosis of the thoracic aorta. She was given a copy of these reports for her reference. Her recent lab work was also good with a normal white blood cell count. Her creatinine however was elevated at 1.22.    Patient Active Problem List   Diagnosis Date Noted  . Aortic atherosclerosis ([place]) 04/22/2016  . Permanent atrial fibrillation ([place]) 10/16/2014  . Metabolic syndrome 72/82/0601  . History of uterine cancer 12/03/2013  . Chronic low back pain 05/23/2013  . High risk medication use 05/23/2013  . Swelling of limb 05/03/2013  . Hypertension 05/17/2011  . Hyperlipidemia 02/10/2009  . TOBACCO ABUSE 02/10/2009  . 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 Atrial fibrillation ([place]) 02/10/2009   Outpatient Encounter Prescriptions as of 05/04/2016  Medication Sig  . albuterol (PROAIR HFA) 108 (90 Base) MCG/ACT inhaler Take 1 to 2 puffs every 4 to 6 hours as needed for SOB  . albuterol (PROVENTIL) (2.5 MG/3ML) 0.083% nebulizer solution Take 3 mLs (2.5 mg total) by nebulization every 6 (six) hours as needed for wheezing or shortness of breath.  Marland Kitchen amLODipine (NORVASC) 5 MG tablet TAKE ONE TABLET BY MOUTH ONCE DAILY  . atorvastatin (LIPITOR) 80 MG tablet TAKE ONE TABLET  BY MOUTH DAILY  . azelastine (ASTELIN) 0.1 % nasal spray USE ONE SPRAY IN EACH NOSTRIL TWICE DAILY  . budesonide (PULMICORT) 0.5 MG/2ML nebulizer solution Take 2 mLs (0.5 mg total) by nebulization 2 (two) times daily.  . Cholecalciferol (VITAMIN D-3) 1000 units CAPS Take 1 capsule by mouth daily.  . cyclobenzaprine (FLEXERIL) 5 MG tablet Take 1 tablet (5 mg total) by mouth 3 (three) times daily as needed for muscle spasms. (Patient not taking: Reported on 04/26/2016)  . fluticasone (FLONASE) 50 MCG/ACT nasal spray USE 2 SPRAYS IN EACH NOSTRIL ONCE DAILY. SHAKE GENTLY BEFORE USING. (Patient not taking: Reported on 04/26/2016)  . loratadine (CLARITIN) 10 MG tablet Take 10 mg by mouth daily.  . Multiple Vitamins-Minerals (CENTRUM SILVER PO) Take 1 tablet by mouth daily.    . NON FORMULARY Oxygen 3L/min   . oxyCODONE-acetaminophen (PERCOCET) 5-325 MG per tablet Take 2 tablets by mouth every 8 (eight) hours as needed.    Marland Kitchen PARoxetine (PAXIL) 40 MG tablet TAKE 1 AND 1/2 TABLETS BY MOUTH DAILY. (Patient not taking: Reported on 04/26/2016)  . potassium chloride (K-DUR,KLOR-CON) 10 MEQ tablet TAKE ONE TABLET BY MOUTH DAILY.  Marland Kitchen predniSONE (DELTASONE) 10 MG tablet Take 1 tab QID x 2 days, then 1 tab TID x 2 days, then 1 tab BID x 2 days, 1 tab QD x 2 days (Patient not taking: Reported on 04/26/2016)  . predniSONE (DELTASONE) 10 MG tablet Take 1 tab QID x 3 days, then 1 tab TID x 3  days, then 1 tab BID x 3 days, then 1 tab QD x 3 days.  Marland Kitchen torsemide (DEMADEX) 20 MG tablet TAKE 1 AND 1/2 TABLETS BY MOUTH ONCE DAILY.  Marland Kitchen warfarin (COUMADIN) 5 MG tablet TAKE 1 TO 2 TABLETS BY MOUTH AS DIRECTED BY ANTICOAGULATION CLINIC.   No facility-administered encounter medications on file as of 05/04/2016.        Review of Systems  Constitutional: Positive for fatigue.  Respiratory: Positive for cough (productive (green)), chest tightness, shortness of breath and wheezing.   Gastrointestinal: Positive for nausea.    Musculoskeletal: Positive for back pain.       Objective:   Physical Exam  Constitutional: She is oriented to person, place, and time. She appears well-developed and well-nourished. She appears distressed.  HENT:  Head: Normocephalic and atraumatic.  Right Ear: External ear normal.  Left Ear: External ear normal.  Nose: Nose normal.  Mouth/Throat: Oropharynx is clear and moist.  Eyes: Conjunctivae and EOM are normal. Pupils are equal, round, and reactive to light. Right eye exhibits no discharge. Left eye exhibits no discharge. No scleral icterus.  Neck: Normal range of motion. Neck supple. No thyromegaly present.  Cardiovascular: Normal rate and normal heart sounds.   No murmur heard. Irregular irregular at 96/m  Pulmonary/Chest: Effort normal. No respiratory distress. She has wheezes. She has no rales.  Tight congested cough  Musculoskeletal: Normal range of motion. She exhibits no edema.  Lymphadenopathy:    She has no cervical adenopathy.  Neurological: She is alert and oriented to person, place, and time.  Skin: Skin is warm and dry. No rash noted.  Psychiatric: She has a normal mood and affect. Her behavior is normal. Judgment and thought content normal.  Nursing note and vitals reviewed.  BP (!) 161/62 (BP Location: Right Arm, Patient Position: Sitting, Cuff Size: Normal)   Pulse 100   Temp 97 F (36.1 C) (Oral)   Ht _0  (1.626 m)   Wt 170 lb (77.1 kg)   SpO2 99% Comment: with 4 lpm O2  BMI 29.18 kg/m   It should be noted that the patient's oxygen was removed and her PO2 dropped immediately to 82% without oxygen.      Assessment & Plan:  1. Permanent atrial fibrillation (HCC) -Up with cardiology as needed, the patient is agreed to see him this afternoon as he is next door. - CoaguChek XS/INR Waived  2. Elevated serum creatinine -We will check this again in one week because of giving her Levaquin 250 one daily for 7 days - BMP8+EGFR  3. SOB (shortness of  breath) -The patient has not improved after being on prednisone and Depo-Medrol and still is coughing and wheezing -Also, it should be noted that the patient's oxygen level dropped after her oxygen was removed to 82% - Ambulatory referral to Pulmonology  4. Bloating -CT scan of the abdomen was reviewed with her she was given copies of this and nothing acute showed up other than adrenal adenoma on the kidney.  Levaquin 250 one daily for 7 days Portable oxygen for use at home Arrange visit with pulmonology and cardiology  Patient Instructions  We will order home oxygen We will arrange a visit with the pulmonologist The cardiologist has agreed to see you this afternoon as a work in to further evaluate your shortness of breath and make sure that it's not associated with your atrial fibrillation. Take the antibiotic that is prescribed and make sure that you have your BMP and  pro time repeated in one week  Arrie Senate MD

## 2016-05-04 NOTE — Progress Notes (Signed)
Cardiology Office Note   Date:  05/04/2016   ID:  Melinda Collins, DOB [birth year], MRN [id number]  PCP:  Redge Gainer, MD  Cardiologist:   [name], MD  Referring:  Dr. Laurance Flatten  Chief Complaint  Patient presents with  . Shortness of Breath      History of Present Illness: Melinda Collins is a 75 y.o. female who presents for evaluation of dyspnea. She was added on today and was seen by Dr. Laurance Flatten. She has chronic shortness of breath and was using oxygen when necessary but now has been using too numerous for the last 2 days. She's had a cough and is now finally bringing up some yellow whitish sputum. She was treated with steroids and antibiotics and is given now an extended course of antibiotics. She was referred here because of the dyspnea. I did review a CT scan which demonstrated some coronary calcification and some small noncalcified lung nodules. She's not describing any palpitations, presyncope or syncope. She is in chronic atrial fibrillation but does not feel this. She denies any chest pressure, neck or arm discomfort. She's had no weight gain or edema. She's not describing PND or orthopnea.  There was a questionable small pericardial cyst.  Past Medical History:  Diagnosis Date  . Arthritis   . COPD (chronic obstructive pulmonary disease) (HCC)    spirometry 04/20/10 FEV1 1.38 (58%) with ration 70  . Depression   . Diastolic dysfunction    noted on echo w/some apparent volume issues in past but not severe   . HLD (hyperlipidemia)   . HOH (hard of hearing)   . HTN (hypertension)    mild; pulmonary  . Permanent atrial fibrillation ([place])   . Ruptured disk   . Tobacco abuse   . Uterine cancer [facility])     Past Surgical History:  Procedure Laterality Date  . ABDOMINAL HYSTERECTOMY     total  . CATARACT EXTRACTION W/PHACO Left 08/25/2014   Procedure: CATARACT EXTRACTION PHACO AND INTRAOCULAR LENS PLACEMENT (IOC);  Surgeon: Tonny Branch, MD;  Location: [facility];   Service: Ophthalmology;  Laterality: Left;  CDE 6.95  . CATARACT EXTRACTION W/PHACO Right 09/11/2014   Procedure: CATARACT EXTRACTION PHACO AND INTRAOCULAR LENS PLACEMENT (IOC);  Surgeon: Tonny Branch, MD;  Location: [facility];  Service: Ophthalmology;  Laterality: Right;  CDE:8.11  . CERVICAL DISC SURGERY     x2  . KYPHOPLASTY    . SPINE SURGERY  2013   cervical fusion     Current Outpatient Prescriptions  Medication Sig Dispense Refill  . albuterol (PROAIR HFA) 108 (90 Base) MCG/ACT inhaler Take 1 to 2 puffs every 4 to 6 hours as needed for SOB 8.5 g 0  . albuterol (PROVENTIL) (2.5 MG/3ML) 0.083% nebulizer solution Take 3 mLs (2.5 mg total) by nebulization every 6 (six) hours as needed for wheezing or shortness of breath. 150 mL 11  . amLODipine (NORVASC) 5 MG tablet TAKE ONE TABLET BY MOUTH ONCE DAILY 30 tablet 3  . atorvastatin (LIPITOR) 80 MG tablet TAKE ONE TABLET BY MOUTH DAILY 30 tablet 3  . azelastine (ASTELIN) 0.1 % nasal spray USE ONE SPRAY IN EACH NOSTRIL TWICE DAILY 30 mL 1  . budesonide (PULMICORT) 0.5 MG/2ML nebulizer solution Take 2 mLs (0.5 mg total) by nebulization 2 (two) times daily. 2 mL 12  . Cholecalciferol (VITAMIN D-3) 1000 units CAPS Take 1 capsule by mouth daily.    . cyclobenzaprine (FLEXERIL) 5 MG tablet Take 1 tablet (5  mg total) by mouth 3 (three) times daily as needed for muscle spasms. 30 tablet 1  . fluticasone (FLONASE) 50 MCG/ACT nasal spray USE 2 SPRAYS IN EACH NOSTRIL ONCE DAILY. SHAKE GENTLY BEFORE USING. 48 g 0  . loratadine (CLARITIN) 10 MG tablet Take 10 mg by mouth daily.    . Multiple Vitamins-Minerals (CENTRUM SILVER PO) Take 1 tablet by mouth daily.      . NON FORMULARY Oxygen 3L/min     . oxyCODONE-acetaminophen (PERCOCET) 5-325 MG per tablet Take 2 tablets by mouth every 8 (eight) hours as needed.      Marland Kitchen PARoxetine (PAXIL) 40 MG tablet TAKE 1 AND 1/2 TABLETS BY MOUTH DAILY. 45 tablet 0  . potassium chloride (K-DUR,KLOR-CON) 10 MEQ tablet TAKE ONE  TABLET BY MOUTH DAILY. 30 tablet 3  . torsemide (DEMADEX) 20 MG tablet TAKE 1 AND 1/2 TABLETS BY MOUTH ONCE DAILY. 45 tablet 3  . warfarin (COUMADIN) 5 MG tablet TAKE 1 TO 2 TABLETS BY MOUTH AS DIRECTED BY ANTICOAGULATION CLINIC. 60 tablet 2  . diltiazem (CARDIZEM) 30 MG tablet Take 1 tablet (30 mg total) by mouth 2 (two) times daily. 60 tablet 6  . levofloxacin (LEVAQUIN) 250 MG tablet Take 1 tablet (250 mg total) by mouth daily. 7 tablet 0   No current facility-administered medications for this visit.     Allergies:   Niaspan [niacin]    Social History:  The patient  reports that she quit smoking about 20 months ago. Her smoking use included Cigarettes. She has a 51.00 pack-year smoking history. She has never used smokeless tobacco. She reports that she does not drink alcohol or use drugs.   Family History:  The patient's family history includes COPD in her brother; Cancer in her brother, brother, and father; Diabetes in her mother; Heart attack in her father; Heart attack (age of onset: 55) in her brother; Heart disease in her brother and mother; Kidney disease in her father; Stroke in her mother.    ROS:  Please see the history of present illness.   Otherwise, review of systems are positive for none.   All other systems are reviewed and negative.    PHYSICAL EXAM: VS:  BP 108/80   Pulse 78   Ht 5\' 4"  (1.626 m)   Wt 168 lb (76.2 kg)   BMI 28.84 kg/m  , BMI Body mass index is 28.84 kg/m. GENERAL:  Well appearing HEENT:  Pupils equal round and reactive, fundi not visualized, oral mucosa unremarkable NECK:  No jugular venous distention, waveform within normal limits, carotid upstroke brisk and symmetric, no bruits, no thyromegaly LYMPHATICS:  No cervical, inguinal adenopathy LUNGS:  Clear to auscultation bilaterally BACK:  No CVA tenderness CHEST:  Unremarkable HEART:  PMI not displaced or sustained,S1 and S2 within normal limits, no S3, no clicks, no rubs, no murmurs,  irregular ABD:  Flat, positive bowel sounds normal in frequency in pitch, no bruits, no rebound, no guarding, no midline pulsatile mass, no hepatomegaly, no splenomegaly EXT:  2 plus pulses throughout, no edema, no cyanosis no clubbing SKIN:  No rashes no nodules NEURO:  Cranial nerves II through XII grossly intact, motor grossly intact throughout PSYCH:  Cognitively intact, oriented to person place and time   EKG:  EKG is ordered today. The ekg ordered today demonstrates atrial fibrillation, rate 130s, axis within normal limits, incomplete right bundle branch block   Recent Labs: 04/22/2016: ALT 21; BUN 13; Creatinine, Ser 1.22; Platelets 193; Potassium 3.7; Sodium  147    Lipid Panel    Component Value Date/Time   CHOL 145 02/16/2016 1247   CHOL 167 09/23/2015 1459   CHOL 121 08/06/2013 1123   TRIG 302 (H) 02/16/2016 1247   TRIG 136 08/06/2013 1123   HDL 51 02/16/2016 1247   HDL 48 08/06/2013 1123   CHOLHDL 3.6 09/23/2015 1459   LDLCALC 46 09/23/2015 1459   LDLCALC 46 08/06/2013 1123      Wt Readings from Last 3 Encounters:  05/04/16 168 lb (76.2 kg)  05/04/16 170 lb (77.1 kg)  04/26/16 170 lb (77.1 kg)      Other studies Reviewed: Additional studies/ records that were reviewed today include: CT, office records. Review of the above records demonstrates:  Please see elsewhere in the note.     ASSESSMENT AND PLAN:  ATRIAL FIB:  This has been permanent. She tolerates anticoagulation. The rate is increased but I think this is probably because of acute on chronic lung disease. I'm going to check a Holter monitor in 2 weeks. In the meantime she's been to start Cardizem immediate release 30 mg twice a day. She will continue with anticoagulation.  DYSPNEA:  She is going to see Dr.Hawkins.  I suspect this is exacerbation of lung disease. I will however check a BNP level.  TOBACCO ABUSE:  She says that she has stopped smoking.    Current medicines are reviewed at length with  the patient today.  The patient does not have concerns regarding medicines.  The following changes have been made:  no change  Labs/ tests ordered today include:   Orders Placed This Encounter  Procedures  . B Nat Peptide  . Holter monitor - 24 hour  . EKG 12-Lead     Disposition:   FU with me in six weeks.     Signed, [name], MD  05/04/2016 4:06 PM    [facility]

## 2016-05-04 NOTE — Addendum Note (Signed)
Addended by: Marin Olp on: 05/04/2016 04:05 PM   Modules accepted: Orders

## 2016-05-04 NOTE — Patient Instructions (Signed)
We will order home oxygen We will arrange a visit with the pulmonologist The cardiologist has agreed to see you this afternoon as a work in to further evaluate your shortness of breath and make sure that it's not associated with your atrial fibrillation. Take the antibiotic that is prescribed and make sure that you have your BMP and pro time repeated in one week

## 2016-05-04 NOTE — Patient Instructions (Signed)
Medication Instructions:  Please start Diltiazem 30 mg twice a day. Continue all other medications as listed.  Labwork: Please have blood work today (BNP) at [facility].  Testing/Procedures: Your physician has recommended that you wear a holter monitor. Holter monitors are medical devices that record the heart's electrical activity. Doctors most often use these monitors to diagnose arrhythmias. Arrhythmias are problems with the speed or rhythm of the heartbeat. The monitor is a small, portable device. You can wear one while you do your normal daily activities. This is usually used to diagnose what is causing palpitations/syncope (passing out).  Follow-Up: Follow up in 6 weeks with Dr Percival Spanish.  If you need a refill on your cardiac medications before your next appointment, please call your pharmacy.  Thank you for choosing [facility]!!

## 2016-05-05 DIAGNOSIS — J449 Chronic obstructive pulmonary disease, unspecified: Secondary | ICD-10-CM | POA: Diagnosis not present

## 2016-05-05 LAB — BMP8+EGFR
BUN / CREAT RATIO: 29 — AB (ref 12–28)
BUN: 62 mg/dL — AB (ref 8–27)
CO2: 23 mmol/L (ref 18–29)
CREATININE: 2.12 mg/dL — AB (ref 0.57–1.00)
Calcium: 8.5 mg/dL — ABNORMAL LOW (ref 8.7–10.3)
Chloride: 97 mmol/L (ref 96–106)
GFR, EST AFRICAN AMERICAN: 26 mL/min/{1.73_m2} — AB (ref >59)
GFR, EST NON AFRICAN AMERICAN: 23 mL/min/{1.73_m2} — AB (ref >59)
GLUCOSE: 89 mg/dL (ref 65–99)
Potassium: 5 mmol/L (ref 3.5–5.2)
SODIUM: 141 mmol/L (ref 134–144)

## 2016-05-06 ENCOUNTER — Telehealth: Payer: Self-pay | Admitting: Family Medicine

## 2016-05-09 ENCOUNTER — Ambulatory Visit (INDEPENDENT_AMBULATORY_CARE_PROVIDER_SITE_OTHER): Payer: Medicare Other | Admitting: Pharmacist

## 2016-05-09 DIAGNOSIS — I482 Chronic atrial fibrillation: Secondary | ICD-10-CM

## 2016-05-09 DIAGNOSIS — Z7901 Long term (current) use of anticoagulants: Secondary | ICD-10-CM

## 2016-05-09 DIAGNOSIS — J9611 Chronic respiratory failure with hypoxia: Secondary | ICD-10-CM | POA: Diagnosis not present

## 2016-05-09 DIAGNOSIS — I4821 Permanent atrial fibrillation: Secondary | ICD-10-CM

## 2016-05-09 DIAGNOSIS — J449 Chronic obstructive pulmonary disease, unspecified: Secondary | ICD-10-CM | POA: Diagnosis not present

## 2016-05-09 DIAGNOSIS — I10 Essential (primary) hypertension: Secondary | ICD-10-CM | POA: Diagnosis not present

## 2016-05-09 LAB — COAGUCHEK XS/INR WAIVED
INR: 4 — ABNORMAL HIGH (ref 0.9–1.1)
Prothrombin Time: 47.5 s

## 2016-05-10 ENCOUNTER — Telehealth: Payer: Self-pay | Admitting: *Deleted

## 2016-05-10 DIAGNOSIS — R7989 Other specified abnormal findings of blood chemistry: Secondary | ICD-10-CM

## 2016-05-10 LAB — BMP8+EGFR
BUN/Creatinine Ratio: 24 (ref 12–28)
BUN: 56 mg/dL — ABNORMAL HIGH (ref 8–27)
CO2: 27 mmol/L (ref 18–29)
Calcium: 9 mg/dL (ref 8.7–10.3)
Chloride: 97 mmol/L (ref 96–106)
Creatinine, Ser: 2.32 mg/dL — ABNORMAL HIGH (ref 0.57–1.00)
GFR, EST AFRICAN AMERICAN: 23 mL/min/{1.73_m2} — AB (ref >59)
GFR, EST NON AFRICAN AMERICAN: 20 mL/min/{1.73_m2} — AB (ref >59)
Glucose: 104 mg/dL — ABNORMAL HIGH (ref 65–99)
POTASSIUM: 5.1 mmol/L (ref 3.5–5.2)
SODIUM: 140 mmol/L (ref 134–144)

## 2016-05-10 NOTE — Telephone Encounter (Signed)
Pt aware that we will arrange a nephrology appt and we will call her  - aware of labs

## 2016-05-11 ENCOUNTER — Other Ambulatory Visit: Payer: Self-pay | Admitting: Family Medicine

## 2016-05-13 ENCOUNTER — Other Ambulatory Visit: Payer: Self-pay | Admitting: Family Medicine

## 2016-05-17 ENCOUNTER — Telehealth: Payer: Self-pay | Admitting: Family Medicine

## 2016-05-17 NOTE — Telephone Encounter (Signed)
Patient states her daughter in law checked her INR 2 night ago and it was 5.  She has not taken warfarin for last 2 days.  Today her DIL checked INR again and it was 1.6.   INstructed her to take 1 and 1/2 tablets of warfarin 5mg  today = 7.5mg .  She is coming in tomorrow to have holter monitor checked - will check INR then.

## 2016-05-18 ENCOUNTER — Ambulatory Visit (INDEPENDENT_AMBULATORY_CARE_PROVIDER_SITE_OTHER): Payer: Medicare Other | Admitting: *Deleted

## 2016-05-18 DIAGNOSIS — I482 Chronic atrial fibrillation, unspecified: Secondary | ICD-10-CM

## 2016-05-18 DIAGNOSIS — I4821 Permanent atrial fibrillation: Secondary | ICD-10-CM

## 2016-05-18 DIAGNOSIS — Z7901 Long term (current) use of anticoagulants: Secondary | ICD-10-CM

## 2016-05-18 DIAGNOSIS — Z23 Encounter for immunization: Secondary | ICD-10-CM | POA: Diagnosis not present

## 2016-05-18 DIAGNOSIS — R0602 Shortness of breath: Secondary | ICD-10-CM

## 2016-05-18 LAB — COAGUCHEK XS/INR WAIVED
INR: 2.5 — ABNORMAL HIGH (ref 0.9–1.1)
PROTHROMBIN TIME: 30.2 s

## 2016-05-18 NOTE — Progress Notes (Signed)
holter monitor [phone])  placed Influenza vaccine given Pt tolerated well

## 2016-05-20 DIAGNOSIS — R002 Palpitations: Secondary | ICD-10-CM | POA: Diagnosis not present

## 2016-05-20 NOTE — Addendum Note (Signed)
Addended by: Zannie Cove on: 05/20/2016 10:41 AM   Modules accepted: Orders

## 2016-05-25 ENCOUNTER — Telehealth: Payer: Self-pay | Admitting: Pharmacist

## 2016-05-25 ENCOUNTER — Ambulatory Visit (INDEPENDENT_AMBULATORY_CARE_PROVIDER_SITE_OTHER): Payer: Medicare Other | Admitting: Pharmacist

## 2016-05-25 DIAGNOSIS — I482 Chronic atrial fibrillation: Secondary | ICD-10-CM | POA: Diagnosis not present

## 2016-05-25 DIAGNOSIS — Z7901 Long term (current) use of anticoagulants: Secondary | ICD-10-CM | POA: Diagnosis not present

## 2016-05-25 DIAGNOSIS — I4821 Permanent atrial fibrillation: Secondary | ICD-10-CM

## 2016-05-25 LAB — COAGUCHEK XS/INR WAIVED
INR: 2 — AB (ref 0.9–1.1)
Prothrombin Time: 24.4 s

## 2016-05-25 NOTE — Patient Instructions (Signed)
Anticoagulation Dose Instructions as of 05/25/2016      Melinda Collins Tue Wed Thu Fri Sat   New Dose 7.5 mg 5 mg 7.5 mg 5 mg 7.5 mg 5 mg 7.5 mg    Description   Start warfarin 5mg  dose to 1 tablet mondays, wednesdays and fridays.  Take 1 and 1/2 tablets all other days.     INR was 2.0 today

## 2016-05-26 NOTE — Telephone Encounter (Signed)
Daughter in law said she would find out about portable concentrator and let me know what my next step is

## 2016-05-26 NOTE — Telephone Encounter (Signed)
Melinda Collins will call me back after she gets info from advanced

## 2016-06-01 ENCOUNTER — Ambulatory Visit (INDEPENDENT_AMBULATORY_CARE_PROVIDER_SITE_OTHER): Payer: Medicare Other | Admitting: Pharmacist

## 2016-06-01 DIAGNOSIS — Z7901 Long term (current) use of anticoagulants: Secondary | ICD-10-CM | POA: Diagnosis not present

## 2016-06-01 DIAGNOSIS — I482 Chronic atrial fibrillation: Secondary | ICD-10-CM

## 2016-06-01 DIAGNOSIS — J449 Chronic obstructive pulmonary disease, unspecified: Secondary | ICD-10-CM | POA: Diagnosis not present

## 2016-06-01 DIAGNOSIS — I4821 Permanent atrial fibrillation: Secondary | ICD-10-CM

## 2016-06-01 LAB — COAGUCHEK XS/INR WAIVED
INR: 2.3 — ABNORMAL HIGH (ref 0.9–1.1)
Prothrombin Time: 27.1 s

## 2016-06-01 NOTE — Patient Instructions (Signed)
Anticoagulation Dose Instructions as of 06/01/2016      Dorene Grebe Tue Wed Thu Fri Sat   New Dose 7.5 mg 5 mg 7.5 mg 5 mg 7.5 mg 5 mg 7.5 mg    Description   Continue current warfarin 5mg  dose  - Take 1 tablet mondays, wednesdays and fridays.  Take 1 and 1/2 tablets all other days.     INR was 2.3 today

## 2016-06-06 ENCOUNTER — Ambulatory Visit (INDEPENDENT_AMBULATORY_CARE_PROVIDER_SITE_OTHER): Payer: Self-pay | Admitting: Pharmacist

## 2016-06-06 DIAGNOSIS — J449 Chronic obstructive pulmonary disease, unspecified: Secondary | ICD-10-CM | POA: Diagnosis not present

## 2016-06-06 DIAGNOSIS — I482 Chronic atrial fibrillation: Secondary | ICD-10-CM | POA: Diagnosis not present

## 2016-06-06 DIAGNOSIS — Z7901 Long term (current) use of anticoagulants: Secondary | ICD-10-CM | POA: Diagnosis not present

## 2016-06-06 LAB — POCT INR: INR: 3.3

## 2016-06-06 NOTE — Progress Notes (Signed)
Patient ID: Melinda Collins, female   DOB: 01-13-1941, 75 y.o.   MRN: [id number]   No charge for labs or visit - INR check through home monitoring system

## 2016-06-07 ENCOUNTER — Encounter: Payer: Self-pay | Admitting: Family Medicine

## 2016-06-07 ENCOUNTER — Ambulatory Visit (INDEPENDENT_AMBULATORY_CARE_PROVIDER_SITE_OTHER): Payer: Medicare Other | Admitting: Family Medicine

## 2016-06-07 VITALS — BP 127/82 | HR 111 | Temp 97.3°F | Ht 64.0 in | Wt 160.0 lb

## 2016-06-07 DIAGNOSIS — E7800 Pure hypercholesterolemia, unspecified: Secondary | ICD-10-CM | POA: Diagnosis not present

## 2016-06-07 DIAGNOSIS — I10 Essential (primary) hypertension: Secondary | ICD-10-CM | POA: Diagnosis not present

## 2016-06-07 DIAGNOSIS — I482 Chronic atrial fibrillation: Secondary | ICD-10-CM | POA: Diagnosis not present

## 2016-06-07 DIAGNOSIS — R0602 Shortness of breath: Secondary | ICD-10-CM | POA: Diagnosis not present

## 2016-06-07 DIAGNOSIS — E559 Vitamin D deficiency, unspecified: Secondary | ICD-10-CM | POA: Diagnosis not present

## 2016-06-07 DIAGNOSIS — M5440 Lumbago with sciatica, unspecified side: Secondary | ICD-10-CM | POA: Diagnosis not present

## 2016-06-07 DIAGNOSIS — G8929 Other chronic pain: Secondary | ICD-10-CM | POA: Diagnosis not present

## 2016-06-07 DIAGNOSIS — I4821 Permanent atrial fibrillation: Secondary | ICD-10-CM

## 2016-06-07 LAB — COAGUCHEK XS/INR WAIVED
INR: 2.9 — AB (ref 0.9–1.1)
Prothrombin Time: 35.4 s

## 2016-06-07 MED ORDER — METHYLPREDNISOLONE ACETATE 80 MG/ML IJ SUSP
60.0000 mg | Freq: Once | INTRAMUSCULAR | Status: AC
  Administered 2016-06-07: 60 mg via INTRAMUSCULAR

## 2016-06-07 MED ORDER — CYCLOBENZAPRINE HCL 5 MG PO TABS: 5.0000 mg | ORAL_TABLET | Freq: Three times a day (TID) | ORAL | 1 refills | Status: DC | PRN

## 2016-06-07 MED ORDER — PREDNISONE 10 MG PO TABS: ORAL_TABLET | ORAL | 0 refills | Status: DC

## 2016-06-07 NOTE — Progress Notes (Signed)
Subjective:    Patient ID: Melinda Collins, female    DOB: 1941-04-12, 75 y.o.   MRN: [id number]  HPI Pt here for follow up and management of chronic medical problems which includes a fib and hyperlipidemia. She is taking medication regularly.She comes to the visit today with her relative. She is seen the pulmonologist and has seen the cardiologist. She has a follow-up visit with both schedule. She also has a visit scheduled with the neurosurgeon because of her chronic back pain and past surgeries. This seems to be her biggest concern today. She is hurting and has very little quality of life. She also has a follow-up appointment with the urologist in reasonable. The patient is pleasant but obviously in a lot of pain with her back. She denies any chest pain. She's is doing better with her inhalers from the pulmonologist. She has no trouble with her stomach including heartburn indigestion nausea vomiting diarrhea or blood in the stool. She is passing her water without problems.     Patient Active Problem List   Diagnosis Date Noted  . Aortic atherosclerosis ([place]) 04/22/2016  . Permanent atrial fibrillation ([place]) 10/16/2014  . Metabolic syndrome 43/32/9518  . History of uterine cancer 12/03/2013  . Chronic low back pain 05/23/2013  . High risk medication use 05/23/2013  . Swelling of limb 05/03/2013  . Hypertension 05/17/2011  . Hyperlipidemia 02/10/2009  . TOBACCO ABUSE 02/10/2009  . 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 Atrial fibrillation ([place]) 02/10/2009   Outpatient Encounter Prescriptions as of 06/07/2016  Medication Sig  . albuterol (PROVENTIL) (2.5 MG/3ML) 0.083% nebulizer solution Take 3 mLs (2.5 mg total) by nebulization every 6 (six) hours as needed for wheezing or shortness of breath.  Marland Kitchen amLODipine (NORVASC) 5 MG tablet TAKE ONE TABLET BY MOUTH ONCE DAILY  . atorvastatin (LIPITOR) 80 MG tablet TAKE ONE TABLET BY MOUTH DAILY  . azelastine (ASTELIN) 0.1 % nasal spray USE ONE  SPRAY IN EACH NOSTRIL TWICE DAILY  . BREO ELLIPTA 100-25 MCG/INH AEPB Take 1 puff by mouth daily.  . Cholecalciferol (VITAMIN D-3) 1000 units CAPS Take 1 capsule by mouth daily.  . cyclobenzaprine (FLEXERIL) 5 MG tablet Take 1 tablet (5 mg total) by mouth 3 (three) times daily as needed for muscle spasms.  Marland Kitchen diltiazem (CARDIZEM) 30 MG tablet Take 1 tablet (30 mg total) by mouth 2 (two) times daily.  . fluticasone (FLONASE) 50 MCG/ACT nasal spray USE 2 SPRAYS IN EACH NOSTRIL ONCE DAILY. SHAKE GENTLY BEFORE USING.  . INCRUSE ELLIPTA 62.5 MCG/INH AEPB Take 1 puff by mouth daily.  Marland Kitchen loratadine (CLARITIN) 10 MG tablet Take 10 mg by mouth daily.  . Multiple Vitamins-Minerals (CENTRUM SILVER PO) Take 1 tablet by mouth daily.    . NON FORMULARY Oxygen 3L/min   . oxyCODONE-acetaminophen (PERCOCET) 5-325 MG per tablet Take 2 tablets by mouth every 8 (eight) hours as needed.    Marland Kitchen PARoxetine (PAXIL) 40 MG tablet TAKE 1 AND 1/2 TABLETS BY MOUTH DAILY. (Patient taking differently: take 1 tablet daily)  . potassium chloride (K-DUR,KLOR-CON) 10 MEQ tablet TAKE ONE TABLET BY MOUTH DAILY.  Marland Kitchen PROAIR HFA 108 (90 Base) MCG/ACT inhaler INHALE 1-2 PUFFS EVERY 4 TO 6 HOURS AS NEEDED FOR SHORTNESS OF BREATH.  . torsemide (DEMADEX) 20 MG tablet TAKE 1 AND 1/2 TABLETS BY MOUTH ONCE DAILY.  Marland Kitchen warfarin (COUMADIN) 5 MG tablet TAKE 1 TO 2 TABLETS BY MOUTH AS DIRECTED BY ANTICOAGULATION CLINIC.   No facility-administered encounter medications on file  as of 06/07/2016.      Review of Systems  Constitutional: Negative.   HENT: Negative.   Eyes: Negative.   Respiratory: Positive for shortness of breath.   Cardiovascular: Negative.   Gastrointestinal: Negative.   Endocrine: Negative.   Genitourinary: Negative.   Musculoskeletal: Positive for arthralgias (right arm pain) and back pain.  Skin: Negative.   Allergic/Immunologic: Negative.   Neurological: Positive for tremors.  Hematological: Negative.     Psychiatric/Behavioral: Negative.        Objective:   Physical Exam  Constitutional: She is oriented to person, place, and time. She appears well-developed and well-nourished. She appears distressed.  Patient is pleasant and alert  HENT:  Head: Normocephalic and atraumatic.  Right Ear: External ear normal.  Left Ear: External ear normal.  Nose: Nose normal.  Mouth/Throat: Oropharynx is clear and moist.  Eyes: Conjunctivae and EOM are normal. Pupils are equal, round, and reactive to light. Right eye exhibits no discharge. Left eye exhibits no discharge. No scleral icterus.  Neck: Normal range of motion. Neck supple. No thyromegaly present.  Cardiovascular: Normal rate, regular rhythm and normal heart sounds.   No murmur heard. The patient remains in atrial fibrillation at about 84/m  Pulmonary/Chest: Effort normal. No respiratory distress. She has no wheezes. She has no rales.  There are diminished breath sounds bilaterally with a few wheezes on inhalation.  Musculoskeletal: Normal range of motion. She exhibits no edema.  Lymphadenopathy:    She has no cervical adenopathy.  Neurological: She is alert and oriented to person, place, and time.  Skin: Skin is warm and dry. No rash noted.  Psychiatric: She has a normal mood and affect. Her behavior is normal. Judgment and thought content normal.  Nursing note and vitals reviewed.  BP 127/82 (BP Location: Right Wrist)   Pulse (!) 111   Temp 97.3 F (36.3 C) (Oral)   Ht '5\' 4"'  (1.626 m)   Wt 160 lb (72.6 kg)   SpO2 95% Comment: on 3 liters  BMI 27.46 kg/m         Assessment & Plan:  1. Essential hypertension -The blood pressure is good today and she will continue with current treatment - BMP8+EGFR - CBC with Differential/Platelet - Hepatic function panel  2. SOB (shortness of breath) -Continue with breathing treatments and inhalers and follow-up with pulmonologist as planned - CBC with Differential/Platelet  3.  Permanent atrial fibrillation ([place]) -Follow-up with cardiology as planned - CBC with Differential/Platelet - CoaguChek XS/INR Waived  4. Pure hypercholesterolemia -Continue current treatment and aggressive therapeutic lifestyle changes as possible - CBC with Differential/Platelet - Lipid panel  5. Vitamin D deficiency -Continue current treatment pending results of lab work - CBC with Differential/Platelet - VITAMIN D 25 Hydroxy (Vit-D Deficiency, Fractures)  6. Chronic low back pain -Follow-up with neurosurgery for any suggestions and help with this. Patient has an appointment.  Meds ordered this encounter  Medications  . methylPREDNISolone acetate (DEPO-MEDROL) injection 60 mg  . predniSONE (DELTASONE) 10 MG tablet    Sig: Take 1 tab QID x 2 days, then 1 tab TID x 2 days, then 1 tab BID x 2 days, then 1 QD x 2 days.    Dispense:  20 tablet    Refill:  0  . cyclobenzaprine (FLEXERIL) 5 MG tablet    Sig: Take 1 tablet (5 mg total) by mouth 3 (three) times daily as needed for muscle spasms.    Dispense:  30 tablet    Refill:  1   Patient Instructions                       Medicare Annual Wellness Visit  [facility] and the medical providers at [facility] strive to bring you the best medical care.  In doing so we not only want to address your current medical conditions and concerns but also to detect new conditions early and prevent illness, disease and health-related problems.    Medicare offers a yearly Wellness Visit which allows our clinical staff to assess your need for preventative services including immunizations, lifestyle education, counseling to decrease risk of preventable diseases and screening for fall risk and other medical concerns.    This visit is provided free of charge (no copay) for all Medicare recipients. The clinical pharmacists at [facility] have begun to conduct these Wellness Visits which will also include a  thorough review of all your medications.    As you primary medical provider recommend that you make an appointment for your Annual Wellness Visit if you have not done so already this year.  You may set up this appointment before you leave today or you may call back ([phone]) and schedule an appointment.  Please make sure when you call that you mention that you are scheduling your Annual Wellness Visit with the clinical pharmacist so that the appointment may be made for the proper length of time.     Continue current medications. Continue good therapeutic lifestyle changes which include good diet and exercise. Fall precautions discussed with patient. If an FOBT was given today- please return it to our front desk. If you are over 82 years old - you may need Prevnar 38 or the adult Pneumonia vaccine.  **Flu shots are available--- please call and schedule a FLU-CLINIC appointment**  After your visit with [place] today you will receive a survey in the mail or online from [employer] regarding your care with [place]. Please take a moment to fill this out. Your feedback is very important to [place] as you can help [place] better understand your patient needs as well as improve your experience and satisfaction. WE CARE ABOUT YOU!!!     Arrie Senate MD

## 2016-06-07 NOTE — Patient Instructions (Addendum)
Medicare Annual Wellness Visit  [facility] and the medical providers at [facility] strive to bring you the best medical care.  In doing so we not only want to address your current medical conditions and concerns but also to detect new conditions early and prevent illness, disease and health-related problems.    Medicare offers a yearly Wellness Visit which allows our clinical staff to assess your need for preventative services including immunizations, lifestyle education, counseling to decrease risk of preventable diseases and screening for fall risk and other medical concerns.    This visit is provided free of charge (no copay) for all Medicare recipients. The clinical pharmacists at [facility] have begun to conduct these Wellness Visits which will also include a thorough review of all your medications.    As you primary medical provider recommend that you make an appointment for your Annual Wellness Visit if you have not done so already this year.  You may set up this appointment before you leave today or you may call back ([phone]) and schedule an appointment.  Please make sure when you call that you mention that you are scheduling your Annual Wellness Visit with the clinical pharmacist so that the appointment may be made for the proper length of time.     Continue current medications. Continue good therapeutic lifestyle changes which include good diet and exercise. Fall precautions discussed with patient. If an FOBT was given today- please return it to our front desk. If you are over 92 years old - you may need Prevnar 22 or the adult Pneumonia vaccine.  **Flu shots are available--- please call and schedule a FLU-CLINIC appointment**  After your visit with [place] today you will receive a survey in the mail or online from [employer] regarding your care with [place]. Please take a moment to fill this out. Your feedback is very  important to [place] as you can help [place] better understand your patient needs as well as improve your experience and satisfaction. WE CARE ABOUT YOU!!!    Follow-up with pulmonology and cardiology and neurosurgery as planned

## 2016-06-08 LAB — BMP8+EGFR
BUN/Creatinine Ratio: 16 (ref 12–28)
BUN: 24 mg/dL (ref 8–27)
CALCIUM: 9.3 mg/dL (ref 8.7–10.3)
CHLORIDE: 103 mmol/L (ref 96–106)
CO2: 26 mmol/L (ref 18–29)
Creatinine, Ser: 1.52 mg/dL — ABNORMAL HIGH (ref 0.57–1.00)
GFR calc Af Amer: 39 mL/min/{1.73_m2} — ABNORMAL LOW (ref >59)
GFR, EST NON AFRICAN AMERICAN: 34 mL/min/{1.73_m2} — AB (ref >59)
GLUCOSE: 87 mg/dL (ref 65–99)
POTASSIUM: 5 mmol/L (ref 3.5–5.2)
SODIUM: 146 mmol/L — AB (ref 134–144)

## 2016-06-08 LAB — CBC WITH DIFFERENTIAL/PLATELET
BASOS: 0 %
Basophils Absolute: 0 10*3/uL (ref 0.0–0.2)
EOS (ABSOLUTE): 0.1 10*3/uL (ref 0.0–0.4)
Eos: 1 %
Hematocrit: 43.8 % (ref 34.0–46.6)
Hemoglobin: 14.3 g/dL (ref 11.1–15.9)
IMMATURE GRANS (ABS): 0 10*3/uL (ref 0.0–0.1)
IMMATURE GRANULOCYTES: 0 %
LYMPHS: 15 %
Lymphocytes Absolute: 1.5 10*3/uL (ref 0.7–3.1)
MCH: 30.6 pg (ref 26.6–33.0)
MCHC: 32.6 g/dL (ref 31.5–35.7)
MCV: 94 fL (ref 79–97)
MONOCYTES: 6 %
Monocytes Absolute: 0.5 10*3/uL (ref 0.1–0.9)
NEUTROS PCT: 78 %
Neutrophils Absolute: 7.4 10*3/uL — ABNORMAL HIGH (ref 1.4–7.0)
PLATELETS: 225 10*3/uL (ref 150–379)
RBC: 4.68 x10E6/uL (ref 3.77–5.28)
RDW: 17.6 % — ABNORMAL HIGH (ref 12.3–15.4)
WBC: 9.5 10*3/uL (ref 3.4–10.8)

## 2016-06-08 LAB — LIPID PANEL
CHOL/HDL RATIO: 2.8 ratio (ref 0.0–4.4)
Cholesterol, Total: 157 mg/dL (ref 100–199)
HDL: 56 mg/dL (ref >39)
LDL Calculated: 58 mg/dL (ref 0–99)
Triglycerides: 214 mg/dL — ABNORMAL HIGH (ref 0–149)
VLDL CHOLESTEROL CAL: 43 mg/dL — AB (ref 5–40)

## 2016-06-08 LAB — VITAMIN D 25 HYDROXY (VIT D DEFICIENCY, FRACTURES): Vit D, 25-Hydroxy: 30.9 ng/mL (ref 30.0–100.0)

## 2016-06-08 LAB — HEPATIC FUNCTION PANEL
ALT: 28 IU/L (ref 0–32)
AST: 21 IU/L (ref 0–40)
Albumin: 3.8 g/dL (ref 3.5–4.8)
Alkaline Phosphatase: 75 IU/L (ref 39–117)
BILIRUBIN TOTAL: 0.3 mg/dL (ref 0.0–1.2)
BILIRUBIN, DIRECT: 0.11 mg/dL (ref 0.00–0.40)
TOTAL PROTEIN: 5.8 g/dL — AB (ref 6.0–8.5)

## 2016-06-13 ENCOUNTER — Ambulatory Visit (INDEPENDENT_AMBULATORY_CARE_PROVIDER_SITE_OTHER): Payer: Self-pay | Admitting: Pharmacist

## 2016-06-13 ENCOUNTER — Other Ambulatory Visit: Payer: Self-pay | Admitting: Family Medicine

## 2016-06-13 LAB — POCT INR: INR: 3.1

## 2016-06-14 NOTE — Progress Notes (Deleted)
Cardiology Office Note   Date:  06/14/2016   ID:  Melinda Collins, DOB [birth year], MRN [id number]  PCP:  Redge Gainer, MD  Cardiologist:   [name], MD  Referring:  Dr. Laurance Flatten  No chief complaint on file.     History of Present Illness: Melinda Collins is a 75 y.o. female who presents for evaluation of dyspnea. She was added to my schedule at the last visit because of dyspnea.  I did not suspect a cardiac etiology.  She did have a rapid heart rate with activity on a follow up Holter. However, I continued current meds because I thought that this was related to her dyspnea.  ***  She was added on today and was seen by Dr. Laurance Flatten. She has chronic shortness of breath and was using oxygen when necessary but now has been using too numerous for the last 2 days. She's had a cough and is now finally bringing up some yellow whitish sputum. She was treated with steroids and antibiotics and is given now an extended course of antibiotics. She was referred here because of the dyspnea. I did review a CT scan which demonstrated some coronary calcification and some small noncalcified lung nodules. She's not describing any palpitations, presyncope or syncope. She is in chronic atrial fibrillation but does not feel this. She denies any chest pressure, neck or arm discomfort. She's had no weight gain or edema. She's not describing PND or orthopnea.  There was a questionable small pericardial cyst.  Past Medical History:  Diagnosis Date  . Arthritis   . COPD (chronic obstructive pulmonary disease) (HCC)    spirometry 04/20/10 FEV1 1.38 (58%) with ration 70  . Depression   . Diastolic dysfunction    noted on echo w/some apparent volume issues in past but not severe   . HLD (hyperlipidemia)   . HOH (hard of hearing)   . HTN (hypertension)    mild; pulmonary  . Permanent atrial fibrillation ([place])   . Ruptured disk   . Tobacco abuse   . Uterine cancer [facility]     Past Surgical History:    Procedure Laterality Date  . ABDOMINAL HYSTERECTOMY     total  . CATARACT EXTRACTION W/PHACO Left 08/25/2014   Procedure: CATARACT EXTRACTION PHACO AND INTRAOCULAR LENS PLACEMENT (IOC);  Surgeon: Tonny Branch, MD;  Location: [facility];  Service: Ophthalmology;  Laterality: Left;  CDE 6.95  . CATARACT EXTRACTION W/PHACO Right 09/11/2014   Procedure: CATARACT EXTRACTION PHACO AND INTRAOCULAR LENS PLACEMENT (IOC);  Surgeon: Tonny Branch, MD;  Location: [facility];  Service: Ophthalmology;  Laterality: Right;  CDE:8.11  . CERVICAL DISC SURGERY     x2  . KYPHOPLASTY    . SPINE SURGERY  2013   cervical fusion     Current Outpatient Prescriptions  Medication Sig Dispense Refill  . albuterol (PROVENTIL) (2.5 MG/3ML) 0.083% nebulizer solution Take 3 mLs (2.5 mg total) by nebulization every 6 (six) hours as needed for wheezing or shortness of breath. 150 mL 11  . amLODipine (NORVASC) 5 MG tablet TAKE ONE TABLET BY MOUTH ONCE DAILY 30 tablet 4  . atorvastatin (LIPITOR) 80 MG tablet TAKE ONE TABLET BY MOUTH DAILY 30 tablet 4  . azelastine (ASTELIN) 0.1 % nasal spray USE ONE SPRAY IN EACH NOSTRIL TWICE DAILY 30 mL 1  . BREO ELLIPTA 100-25 MCG/INH AEPB Take 1 puff by mouth daily.  0  . Cholecalciferol (VITAMIN D-3) 1000 units CAPS Take 1 capsule by mouth daily.    Marland Kitchen  cyclobenzaprine (FLEXERIL) 5 MG tablet Take 1 tablet (5 mg total) by mouth 3 (three) times daily as needed for muscle spasms. 30 tablet 1  . diltiazem (CARDIZEM) 30 MG tablet Take 1 tablet (30 mg total) by mouth 2 (two) times daily. 60 tablet 6  . fluticasone (FLONASE) 50 MCG/ACT nasal spray USE 2 SPRAYS IN EACH NOSTRIL ONCE DAILY. SHAKE GENTLY BEFORE USING. 48 g 1  . INCRUSE ELLIPTA 62.5 MCG/INH AEPB Take 1 puff by mouth daily.  0  . loratadine (CLARITIN) 10 MG tablet Take 10 mg by mouth daily.    . Multiple Vitamins-Minerals (CENTRUM SILVER PO) Take 1 tablet by mouth daily.      . NON FORMULARY Oxygen 3L/min     . oxyCODONE-acetaminophen (PERCOCET)  5-325 MG per tablet Take 2 tablets by mouth every 8 (eight) hours as needed.      Marland Kitchen PARoxetine (PAXIL) 40 MG tablet TAKE 1 AND 1/2 TABLETS BY MOUTH DAILY. (Patient taking differently: take 1 tablet daily) 45 tablet 2  . potassium chloride (K-DUR,KLOR-CON) 10 MEQ tablet TAKE ONE TABLET BY MOUTH DAILY. 30 tablet 3  . predniSONE (DELTASONE) 10 MG tablet Take 1 tab QID x 2 days, then 1 tab TID x 2 days, then 1 tab BID x 2 days, then 1 QD x 2 days. 20 tablet 0  . PROAIR HFA 108 (90 Base) MCG/ACT inhaler INHALE 1-2 PUFFS EVERY 4 TO 6 HOURS AS NEEDED FOR SHORTNESS OF BREATH. 8.5 g 5  . torsemide (DEMADEX) 20 MG tablet TAKE 1 AND 1/2 TABLETS BY MOUTH ONCE DAILY. 45 tablet 3  . warfarin (COUMADIN) 5 MG tablet TAKE 1 TO 2 TABLETS BY MOUTH AS DIRECTED BY ANTICOAGULATION CLINIC. 60 tablet 2   No current facility-administered medications for this visit.     Allergies:   Niaspan [niacin]   ROS:  Please see the history of present illness.   Otherwise, review of systems are positive for ***.   All other systems are reviewed and negative.    PHYSICAL EXAM: VS:  There were no vitals taken for this visit. , BMI There is no height or weight on file to calculate BMI. GENERAL:  Well appearing NECK:  No jugular venous distention, waveform within normal limits, carotid upstroke brisk and symmetric, no bruits, no thyromegaly LYMPHATICS:  No cervical, inguinal adenopathy LUNGS:  Clear to auscultation bilaterally BACK:  No CVA tenderness CHEST:  Unremarkable HEART:  PMI not displaced or sustained,S1 and S2 within normal limits, no S3, no clicks, no rubs, no murmurs, irregular ABD:  Flat, positive bowel sounds normal in frequency in pitch, no bruits, no rebound, no guarding, no midline pulsatile mass, no hepatomegaly, no splenomegaly EXT:  2 plus pulses throughout, no edema, no cyanosis no clubbing   EKG:  EKG is *** ordered today. The ekg ordered today demonstrates atrial fibrillation, rate 130s, axis within  normal limits, incomplete right bundle branch block   Recent Labs: 06/07/2016: ALT 28; BUN 24; Creatinine, Ser 1.52; Platelets 225; Potassium 5.0; Sodium 146    Lipid Panel    Component Value Date/Time   CHOL 157 06/07/2016 1542   CHOL 121 08/06/2013 1123   TRIG 214 (H) 06/07/2016 1542   TRIG 302 (H) 02/16/2016 1247   TRIG 136 08/06/2013 1123   HDL 56 06/07/2016 1542   HDL 51 02/16/2016 1247   HDL 48 08/06/2013 1123   CHOLHDL 2.8 06/07/2016 1542   LDLCALC 58 06/07/2016 1542   LDLCALC 46 08/06/2013 1123  Wt Readings from Last 3 Encounters:  06/07/16 160 lb (72.6 kg)  05/04/16 168 lb (76.2 kg)  05/04/16 170 lb (77.1 kg)      Other studies Reviewed: Additional studies/ records that were reviewed today include: CT, office records. Review of the above records demonstrates:  Please see elsewhere in the note.     ASSESSMENT AND PLAN:  ATRIAL FIB:  This has been permanent. She tolerates anticoagulation. ***   DYSPNEA:  *** She is going to see Dr.Hawkins.  I suspect this is exacerbation of lung disease. I will however check a BNP level.  TOBACCO ABUSE:  She says that she has stopped smoking.    Current medicines are reviewed at length with the patient today.  The patient does not have concerns regarding medicines.  The following changes have been made:  ***  Labs/ tests ordered today include:  ***  No orders of the defined types were placed in this encounter.    Disposition:   FU with me in *** weeks.     Signed, [name], MD  06/14/2016 8:59 PM    [facility]

## 2016-06-15 ENCOUNTER — Ambulatory Visit: Payer: Medicare Other | Admitting: Cardiology

## 2016-06-20 ENCOUNTER — Ambulatory Visit (INDEPENDENT_AMBULATORY_CARE_PROVIDER_SITE_OTHER): Payer: Medicare Other | Admitting: Pharmacist

## 2016-06-20 DIAGNOSIS — I482 Chronic atrial fibrillation: Secondary | ICD-10-CM

## 2016-06-20 LAB — POCT INR: INR: 2.2

## 2016-06-20 NOTE — Progress Notes (Signed)
Patient ID: Melinda Collins, female   DOB: 1940-08-24, 75 y.o.   MRN: [id number]   Patient checks INR at home with Home INR monitoring.  Billing once per month interupertation fee.  Patient diagnosis - atrial fibrillation INR goal is 2.0 to 3.0 Today's INR was 2.2 Continue current warfarin dose of 1 and 1/2 tablets on Mondays and Fridays. 1 tablet all other days. Recheck in 1 week.  Procedure code if [id number]

## 2016-06-23 ENCOUNTER — Other Ambulatory Visit: Payer: Self-pay | Admitting: Family Medicine

## 2016-06-27 ENCOUNTER — Ambulatory Visit: Payer: Self-pay | Admitting: Pharmacist

## 2016-06-27 DIAGNOSIS — I482 Chronic atrial fibrillation: Secondary | ICD-10-CM | POA: Diagnosis not present

## 2016-06-27 DIAGNOSIS — Z7901 Long term (current) use of anticoagulants: Secondary | ICD-10-CM | POA: Diagnosis not present

## 2016-06-27 LAB — POCT INR: INR: 2.1

## 2016-06-27 NOTE — Progress Notes (Signed)
Patient ID: Melinda Collins, female   DOB: July 14, 1941, 75 y.o.   MRN: [id number]   No charge for labs or visit - INR check through home monitoring system

## 2016-06-30 DIAGNOSIS — M4716 Other spondylosis with myelopathy, lumbar region: Secondary | ICD-10-CM | POA: Diagnosis not present

## 2016-07-04 LAB — POCT INR: INR: 3.1

## 2016-07-05 ENCOUNTER — Ambulatory Visit (INDEPENDENT_AMBULATORY_CARE_PROVIDER_SITE_OTHER): Payer: Self-pay | Admitting: Pharmacist

## 2016-07-05 NOTE — Progress Notes (Signed)
Patient ID: Melinda Collins, female   DOB: 02/23/1941, 75 y.o.   MRN: [id number]   No charge for labs or visit - INR check through home monitoring system

## 2016-07-06 DIAGNOSIS — J449 Chronic obstructive pulmonary disease, unspecified: Secondary | ICD-10-CM | POA: Diagnosis not present

## 2016-07-07 ENCOUNTER — Encounter: Payer: Self-pay | Admitting: *Deleted

## 2016-07-11 LAB — POCT INR: INR: 2.6

## 2016-07-15 ENCOUNTER — Other Ambulatory Visit: Payer: Self-pay | Admitting: Family Medicine

## 2016-07-15 ENCOUNTER — Ambulatory Visit (INDEPENDENT_AMBULATORY_CARE_PROVIDER_SITE_OTHER): Payer: Self-pay | Admitting: Pharmacist

## 2016-07-25 ENCOUNTER — Ambulatory Visit (INDEPENDENT_AMBULATORY_CARE_PROVIDER_SITE_OTHER): Payer: Medicare Other | Admitting: Pharmacist

## 2016-07-25 DIAGNOSIS — I482 Chronic atrial fibrillation: Secondary | ICD-10-CM | POA: Diagnosis not present

## 2016-07-25 DIAGNOSIS — I4821 Permanent atrial fibrillation: Secondary | ICD-10-CM

## 2016-07-25 DIAGNOSIS — Z7901 Long term (current) use of anticoagulants: Secondary | ICD-10-CM

## 2016-07-25 LAB — POCT INR: INR: 2.4

## 2016-07-25 NOTE — Progress Notes (Signed)
Patient checks INR at home with Home INR monitoring.  Billing once per month interupertation fee.  Patient diagnosis - atrial fibrillation, permenant Procedure code if [id number]

## 2016-07-27 ENCOUNTER — Telehealth: Payer: Self-pay

## 2016-07-27 NOTE — Telephone Encounter (Signed)
Daughter in law would like to start the process of CAPS for patient. She states that patient has off and on lived with them but always goes back home and they just don't think she is able to completely take care of herself and would like some help in the home if possible.

## 2016-07-28 NOTE — Telephone Encounter (Signed)
Patient should get into contact with the CAP [phone]

## 2016-08-06 DIAGNOSIS — J449 Chronic obstructive pulmonary disease, unspecified: Secondary | ICD-10-CM | POA: Diagnosis not present

## 2016-08-09 ENCOUNTER — Telehealth: Payer: Self-pay | Admitting: Family Medicine

## 2016-08-09 ENCOUNTER — Telehealth: Payer: Self-pay | Admitting: Pharmacist

## 2016-08-09 NOTE — Progress Notes (Signed)
Cardiology Office Note   Date:  08/10/2016   ID:  KRISY DIX, DOB 08-18-1940, MRN [id number]  PCP:  Redge Gainer, MD  Cardiologist:   [name], MD  Referring:  Dr. Laurance Flatten  Chief Complaint  Patient presents with  . Palpitations      History of Present Illness: Melinda Collins is a 76 y.o. female who presents for evaluation of dyspnea and atrial fib.  The last time I saw her she had increased dyspnea.  At the last visit I thought this was probably related to acute on chronic lung disease.  She was started on Cardizem IR while Dr. Luan Pulling treated her lung disease.  She had a relatively controlled rate other worsening spikes with activity. She returns for follow-up.   She's had her breathing medications changed.  Her daughter thinks she is breathing better. She is having fewer palpitations and dizzy spells and she was having. It may happen occasionally for couple of minutes but since starting Cardizem she's done better. She takes Mucinex and that helps her clear her cough. She's not having any presyncope or syncope. She denies any chest pressure, neck or arm discomfort. He's had no weight gain or edema. She's very limited by her chronic lung disease and is on chronic oxygen.   Past Medical History:  Diagnosis Date  . Arthritis   . COPD (chronic obstructive pulmonary disease) (HCC)    spirometry 04/20/10 FEV1 1.38 (58%) with ration 70  . Depression   . Diastolic dysfunction    noted on echo w/some apparent volume issues in past but not severe   . HLD (hyperlipidemia)   . HOH (hard of hearing)   . HTN (hypertension)    mild; pulmonary  . Permanent atrial fibrillation ([place])   . Ruptured disk   . Tobacco abuse   . Uterine cancer [facility])     Past Surgical History:  Procedure Laterality Date  . ABDOMINAL HYSTERECTOMY     total  . CATARACT EXTRACTION W/PHACO Left 08/25/2014   Procedure: CATARACT EXTRACTION PHACO AND INTRAOCULAR LENS PLACEMENT (IOC);  Surgeon: Tonny Branch, MD;  Location: [facility];  Service: Ophthalmology;  Laterality: Left;  CDE 6.95  . CATARACT EXTRACTION W/PHACO Right 09/11/2014   Procedure: CATARACT EXTRACTION PHACO AND INTRAOCULAR LENS PLACEMENT (IOC);  Surgeon: Tonny Branch, MD;  Location: [facility];  Service: Ophthalmology;  Laterality: Right;  CDE:8.11  . CERVICAL DISC SURGERY     x2  . KYPHOPLASTY    . SPINE SURGERY  2013   cervical fusion     Current Outpatient Prescriptions  Medication Sig Dispense Refill  . albuterol (PROVENTIL) (2.5 MG/3ML) 0.083% nebulizer solution Take 3 mLs (2.5 mg total) by nebulization every 6 (six) hours as needed for wheezing or shortness of breath. 150 mL 11  . amLODipine (NORVASC) 5 MG tablet TAKE ONE TABLET BY MOUTH ONCE DAILY 30 tablet 4  . atorvastatin (LIPITOR) 80 MG tablet TAKE ONE TABLET BY MOUTH DAILY 30 tablet 4  . azelastine (ASTELIN) 0.1 % nasal spray USE ONE SPRAY IN EACH NOSTRIL TWICE DAILY 30 mL 1  . BREO ELLIPTA 100-25 MCG/INH AEPB Take 1 puff by mouth daily.  0  . Cholecalciferol (VITAMIN D-3) 1000 units CAPS Take 1 capsule by mouth daily.    Marland Kitchen diltiazem (CARDIZEM) 30 MG tablet Take 1 tablet (30 mg total) by mouth 2 (two) times daily. 60 tablet 6  . fentaNYL (DURAGESIC - DOSED MCG/HR) 50 MCG/HR Place 50 mcg onto the  skin every 3 (three) days.    . fluticasone (FLONASE) 50 MCG/ACT nasal spray USE 2 SPRAYS IN EACH NOSTRIL ONCE DAILY. SHAKE GENTLY BEFORE USING. 48 g 1  . INCRUSE ELLIPTA 62.5 MCG/INH AEPB Take 1 puff by mouth daily.  0  . loratadine (CLARITIN) 10 MG tablet Take 10 mg by mouth daily as needed.     . Multiple Vitamins-Minerals (CENTRUM SILVER PO) Take 1 tablet by mouth daily.      . NON FORMULARY Oxygen 3L/min     . oxyCODONE-acetaminophen (PERCOCET) 5-325 MG per tablet Take 2 tablets by mouth every 8 (eight) hours as needed.      Marland Kitchen PARoxetine (PAXIL) 40 MG tablet TAKE 1 AND 1/2 TABLETS BY MOUTH DAILY. (Patient taking differently: take 1 tablet daily) 45 tablet 2  . potassium  chloride (K-DUR,KLOR-CON) 10 MEQ tablet TAKE ONE TABLET BY MOUTH DAILY. 30 tablet 1  . PROAIR HFA 108 (90 Base) MCG/ACT inhaler INHALE 1-2 PUFFS EVERY 4 TO 6 HOURS AS NEEDED FOR SHORTNESS OF BREATH. 8.5 g 5  . torsemide (DEMADEX) 20 MG tablet TAKE 1 AND 1/2 TABLETS BY MOUTH ONCE DAILY. 45 tablet 3  . warfarin (COUMADIN) 5 MG tablet TAKE 1 TO 2 TABLETS BY MOUTH AS DIRECTED BY ANTICOAGULATION CLINIC. 60 tablet 2  . guaiFENesin (MUCINEX) 600 MG 12 hr tablet Take 1 tablet (600 mg total) by mouth 2 (two) times daily as needed. 60 tablet 1   No current facility-administered medications for this visit.     Allergies:   Niaspan [niacin]    ROS:  Please see the history of present illness.   Otherwise, review of systems are positive for none.   All other systems are reviewed and negative.    PHYSICAL EXAM: VS:  BP (!) 123/91   Pulse (!) 107   Ht 5\' 4"  (1.626 m)   Wt 166 lb (75.3 kg)   SpO2 99% Comment: on 3L O2 via [place]  BMI 28.49 kg/m  , BMI Body mass index is 28.49 kg/m. GENERAL:  Well appearing NECK:  No jugular venous distention, waveform within normal limits, carotid upstroke brisk and symmetric, no bruits, no thyromegaly LUNGS:  Clear to auscultation bilaterally BACK:  No CVA tenderness CHEST:  Unremarkable HEART:  PMI not displaced or sustained,S1 and S2 within normal limits, no S3, no clicks, no rubs, no murmurs, irregular ABD:  Flat, positive bowel sounds normal in frequency in pitch, no bruits, no rebound, no guarding, no midline pulsatile mass, no hepatomegaly, no splenomegaly EXT:  2 plus pulses throughout, no edema, no cyanosis no clubbing   EKG:  EKG is not ordered today.   Recent Labs: 06/07/2016: ALT 28; BUN 24; Creatinine, Ser 1.52; Platelets 225; Potassium 5.0; Sodium 146    Lipid Panel    Component Value Date/Time   CHOL 157 06/07/2016 1542   CHOL 121 08/06/2013 1123   TRIG 214 (H) 06/07/2016 1542   TRIG 302 (H) 02/16/2016 1247   TRIG 136 08/06/2013 1123   HDL  56 06/07/2016 1542   HDL 51 02/16/2016 1247   HDL 48 08/06/2013 1123   CHOLHDL 2.8 06/07/2016 1542   LDLCALC 58 06/07/2016 1542   LDLCALC 46 08/06/2013 1123      Wt Readings from Last 3 Encounters:  08/10/16 166 lb (75.3 kg)  06/07/16 160 lb (72.6 kg)  05/04/16 168 lb (76.2 kg)      Other studies Reviewed: Additional studies/ records that were reviewed today include: None Review of the  above records demonstrates:     ASSESSMENT AND PLAN:  ATRIAL FIB:   This has been permanent. She tolerates anticoagulation. The rate is reasonably controlled given her chronic lung disease and dyspnea. No change in therapy is indicated.  DYSPNEA:  She is managed for chronic lung disease seems to be improved.  CORONARY CALCIUM:  We have managed this medically.    TOBACCO ABUSE:  She admits she is still smoking and she will not quit.   Current medicines are reviewed at length with the patient today.  The patient does not have concerns regarding medicines.  The following changes have been made:  no change  Labs/ tests ordered today include:  None  No orders of the defined types were placed in this encounter.    Disposition:   FU with me in four months.     Signed, [name], MD  08/10/2016 1:37 PM    [facility]

## 2016-08-09 NOTE — Telephone Encounter (Signed)
INR recheck past due - patient's daughter in law is checking with in home INR monitor and is reported through [facility]. We have not received report of INR in over 1 week.  First call was dropped.  Second call LM on VM reminding DIL that INR is due and should be completed ASAP and reported through [facility]>

## 2016-08-09 NOTE — Telephone Encounter (Signed)
Pt daughter aware of CAPS phone number

## 2016-08-10 ENCOUNTER — Ambulatory Visit (INDEPENDENT_AMBULATORY_CARE_PROVIDER_SITE_OTHER): Payer: Self-pay | Admitting: Pharmacist

## 2016-08-10 ENCOUNTER — Other Ambulatory Visit: Payer: Self-pay | Admitting: Family Medicine

## 2016-08-10 ENCOUNTER — Encounter: Payer: Self-pay | Admitting: Cardiology

## 2016-08-10 ENCOUNTER — Ambulatory Visit (INDEPENDENT_AMBULATORY_CARE_PROVIDER_SITE_OTHER): Payer: Medicare Other | Admitting: Cardiology

## 2016-08-10 VITALS — BP 123/91 | HR 107 | Ht 64.0 in | Wt 166.0 lb

## 2016-08-10 DIAGNOSIS — I482 Chronic atrial fibrillation, unspecified: Secondary | ICD-10-CM

## 2016-08-10 DIAGNOSIS — R0602 Shortness of breath: Secondary | ICD-10-CM | POA: Diagnosis not present

## 2016-08-10 LAB — POCT INR: INR: 2.3

## 2016-08-10 MED ORDER — GUAIFENESIN ER 600 MG PO TB12: 600.0000 mg | ORAL_TABLET | Freq: Two times a day (BID) | ORAL | 1 refills | Status: DC | PRN

## 2016-08-10 NOTE — Patient Instructions (Signed)
Medication Instructions:  The current medical regimen is effective;  continue present plan and medications.  Follow-Up: Follow up in 4 months with Dr. Percival Spanish.  You will receive a letter in the mail 2 months before you are due.  Please call us when you receive this letter to schedule your follow up appointment.  If you need a refill on your cardiac medications before your next appointment, please call your pharmacy.  Thank you for choosing [facility]!!

## 2016-08-12 ENCOUNTER — Ambulatory Visit (INDEPENDENT_AMBULATORY_CARE_PROVIDER_SITE_OTHER): Payer: Self-pay | Admitting: Pharmacist

## 2016-08-12 LAB — POCT INR: INR: 2.4

## 2016-08-15 DIAGNOSIS — I482 Chronic atrial fibrillation: Secondary | ICD-10-CM | POA: Diagnosis not present

## 2016-08-15 DIAGNOSIS — Z7901 Long term (current) use of anticoagulants: Secondary | ICD-10-CM | POA: Diagnosis not present

## 2016-08-15 LAB — POCT INR: INR: 2.7

## 2016-08-16 ENCOUNTER — Ambulatory Visit (INDEPENDENT_AMBULATORY_CARE_PROVIDER_SITE_OTHER): Payer: Self-pay | Admitting: Pharmacist

## 2016-08-16 NOTE — Progress Notes (Signed)
No charge for labs or visit - INR check through home monitoring system  

## 2016-08-22 ENCOUNTER — Other Ambulatory Visit: Payer: Self-pay | Admitting: Family Medicine

## 2016-08-22 LAB — POCT INR: INR: 2.3

## 2016-08-23 ENCOUNTER — Ambulatory Visit (INDEPENDENT_AMBULATORY_CARE_PROVIDER_SITE_OTHER): Payer: Medicare Other | Admitting: Pharmacist

## 2016-08-23 DIAGNOSIS — I482 Chronic atrial fibrillation: Secondary | ICD-10-CM

## 2016-08-23 NOTE — Progress Notes (Signed)
Patient ID: Melinda Collins, female   DOB: 07/16/1941, 76 y.o.   MRN: [id number]   Subjective:     Indication: atrial fibrillation Bleeding signs/symptoms: None Thromboembolic signs/symptoms: None  Missed Coumadin doses: None Medication changes: no Dietary changes: no Bacterial/viral infection: no Other concerns: no   Objective:    INR Today: 2.3  Current dose: warfarin 5mg  - take 1 and 1/2 tablets on mondays and fridays.  Take 1 tablet all other days  Assessment:    Therapeutic INR for goal of 2-3   Plan:    1. New dose: no change   2. Next INR: 1 week   Patient checks INR at home with Home INR monitoring.  Billing once per month interupertation fee.  Patient diagnosis - atrial fibrillation  Procedure code [phone]

## 2016-08-29 ENCOUNTER — Ambulatory Visit: Payer: Self-pay | Admitting: Pharmacist

## 2016-08-29 LAB — POCT INR: INR: 2.5

## 2016-09-05 ENCOUNTER — Other Ambulatory Visit: Payer: Self-pay | Admitting: Family Medicine

## 2016-09-05 ENCOUNTER — Ambulatory Visit (INDEPENDENT_AMBULATORY_CARE_PROVIDER_SITE_OTHER): Payer: Self-pay | Admitting: Pharmacist

## 2016-09-05 LAB — POCT INR: INR: 2.3

## 2016-09-05 NOTE — Consult Note (Signed)
No charge for labs or visit - INR check through home monitoring system  

## 2016-09-06 DIAGNOSIS — J449 Chronic obstructive pulmonary disease, unspecified: Secondary | ICD-10-CM | POA: Diagnosis not present

## 2016-09-12 ENCOUNTER — Other Ambulatory Visit: Payer: Self-pay | Admitting: Family Medicine

## 2016-09-14 ENCOUNTER — Ambulatory Visit (INDEPENDENT_AMBULATORY_CARE_PROVIDER_SITE_OTHER): Payer: Self-pay | Admitting: Pharmacist

## 2016-09-14 DIAGNOSIS — I482 Chronic atrial fibrillation: Secondary | ICD-10-CM | POA: Diagnosis not present

## 2016-09-14 DIAGNOSIS — Z7901 Long term (current) use of anticoagulants: Secondary | ICD-10-CM | POA: Diagnosis not present

## 2016-09-14 LAB — POCT INR: INR: 2.4

## 2016-09-19 LAB — POCT INR: INR: 2.3

## 2016-09-20 ENCOUNTER — Ambulatory Visit (INDEPENDENT_AMBULATORY_CARE_PROVIDER_SITE_OTHER): Payer: Medicare Other | Admitting: Pharmacist

## 2016-09-20 DIAGNOSIS — I4891 Unspecified atrial fibrillation: Secondary | ICD-10-CM | POA: Diagnosis not present

## 2016-09-20 NOTE — Progress Notes (Signed)
Patient ID: Melinda Collins, female   DOB: 09-18-1940, 76 y.o.   MRN: [id number]   Patient checks INR at home weekly with Home INR monitoring.  Billing once per month interupertation fee.  Patient diagnosis - chronic atrial fibrillation Procedure code if [id number]

## 2016-09-23 DIAGNOSIS — H353121 Nonexudative age-related macular degeneration, left eye, early dry stage: Secondary | ICD-10-CM | POA: Diagnosis not present

## 2016-09-23 DIAGNOSIS — Z961 Presence of intraocular lens: Secondary | ICD-10-CM | POA: Diagnosis not present

## 2016-09-23 DIAGNOSIS — H26493 Other secondary cataract, bilateral: Secondary | ICD-10-CM | POA: Diagnosis not present

## 2016-09-23 DIAGNOSIS — H40013 Open angle with borderline findings, low risk, bilateral: Secondary | ICD-10-CM | POA: Diagnosis not present

## 2016-09-29 ENCOUNTER — Other Ambulatory Visit: Payer: Self-pay | Admitting: Family Medicine

## 2016-09-29 LAB — POCT INR: INR: 2.5

## 2016-09-30 ENCOUNTER — Ambulatory Visit (INDEPENDENT_AMBULATORY_CARE_PROVIDER_SITE_OTHER): Payer: Medicare Other | Admitting: Pharmacist

## 2016-10-04 DIAGNOSIS — J449 Chronic obstructive pulmonary disease, unspecified: Secondary | ICD-10-CM | POA: Diagnosis not present

## 2016-10-07 ENCOUNTER — Ambulatory Visit (INDEPENDENT_AMBULATORY_CARE_PROVIDER_SITE_OTHER): Payer: Medicare Other | Admitting: Family Medicine

## 2016-10-07 ENCOUNTER — Encounter: Payer: Self-pay | Admitting: Family Medicine

## 2016-10-07 VITALS — BP 123/79 | HR 99 | Temp 97.6°F | Ht 64.0 in | Wt 159.0 lb

## 2016-10-07 DIAGNOSIS — J439 Emphysema, unspecified: Secondary | ICD-10-CM | POA: Diagnosis not present

## 2016-10-07 DIAGNOSIS — E559 Vitamin D deficiency, unspecified: Secondary | ICD-10-CM

## 2016-10-07 DIAGNOSIS — I10 Essential (primary) hypertension: Secondary | ICD-10-CM | POA: Diagnosis not present

## 2016-10-07 DIAGNOSIS — R0602 Shortness of breath: Secondary | ICD-10-CM

## 2016-10-07 DIAGNOSIS — M5440 Lumbago with sciatica, unspecified side: Secondary | ICD-10-CM | POA: Diagnosis not present

## 2016-10-07 DIAGNOSIS — I4821 Permanent atrial fibrillation: Secondary | ICD-10-CM

## 2016-10-07 DIAGNOSIS — I482 Chronic atrial fibrillation: Secondary | ICD-10-CM | POA: Diagnosis not present

## 2016-10-07 DIAGNOSIS — G8929 Other chronic pain: Secondary | ICD-10-CM

## 2016-10-07 DIAGNOSIS — E7800 Pure hypercholesterolemia, unspecified: Secondary | ICD-10-CM

## 2016-10-07 NOTE — Patient Instructions (Addendum)
Medicare Annual Wellness Visit  [facility] and the medical providers at [facility] strive to bring you the best medical care.  In doing so we not only want to address your current medical conditions and concerns but also to detect new conditions early and prevent illness, disease and health-related problems.    Medicare offers a yearly Wellness Visit which allows our clinical staff to assess your need for preventative services including immunizations, lifestyle education, counseling to decrease risk of preventable diseases and screening for fall risk and other medical concerns.    This visit is provided free of charge (no copay) for all Medicare recipients. The clinical pharmacists at [facility] have begun to conduct these Wellness Visits which will also include a thorough review of all your medications.    As you primary medical provider recommend that you make an appointment for your Annual Wellness Visit if you have not done so already this year.  You may set up this appointment before you leave today or you may call back ([phone]) and schedule an appointment.  Please make sure when you call that you mention that you are scheduling your Annual Wellness Visit with the clinical pharmacist so that the appointment may be made for the proper length of time.     Continue current medications. Continue good therapeutic lifestyle changes which include good diet and exercise. Fall precautions discussed with patient. If an FOBT was given today- please return it to our front desk. If you are over 66 years old - you may need Prevnar 42 or the adult Pneumonia vaccine.  **Flu shots are available--- please call and schedule a FLU-CLINIC appointment**  After your visit with [place] today you will receive a survey in the mail or online from [employer] regarding your care with [place]. Please take a moment to fill this out. Your feedback is very  important to [place] as you can help [place] better understand your patient needs as well as improve your experience and satisfaction. WE CARE ABOUT YOU!!!   Continue follow-up with neurosurgery pulmonology and cardiology Get pro times checked as needed and as directed by the clinical pharmacist Continue to drink plenty of fluids and stay well hydrated

## 2016-10-07 NOTE — Progress Notes (Signed)
Subjective:    Patient ID: Melinda Collins, female    DOB: 08-21-1940, 76 y.o.   MRN: [id number]  HPI Pt here for follow up and management of chronic medical problems which includes hyperlipidemia and a fib. She is taking medication regularly.This patient has a history of atrial fibrillation COPD nicotine abuse and aortic atherosclerosis. She sees the cardiologist, the neurosurgeon, and the pulmonologist. She gets her pro times checked regularly here because of the atrial fibrillation. She last saw the cardiologist on January 24. That note was reviewed today. She has no specific complaints today but is requesting a refill on her nebulizer mass continue being in her albuterol. She will get lab work today. Her pulse ox today was 97% on 2 L of O2. The patient comes with her daughter-in-law to the visit and they seem to have a very good relationship. The patient not only sees a cardiologist and the pulmonologist but she also sees the neurosurgeon. She is using her O2 currently. She denies any chest pain or shortness of breath anymore than the shortness of breath she has with her breathing. She does have occasional trouble with swallowing but this is not frequent. Her bowels are moving well and she has no trouble passing her water.   Patient Active Problem List   Diagnosis Date Noted  . Aortic atherosclerosis ([place]) 04/22/2016  . Permanent atrial fibrillation ([place]) 10/16/2014  . Metabolic syndrome 80/32/1224  . History of uterine cancer 12/03/2013  . Chronic low back pain 05/23/2013  . High risk medication use 05/23/2013  . Swelling of limb 05/03/2013  . Hypertension 05/17/2011  . Hyperlipidemia 02/10/2009  . TOBACCO ABUSE 02/10/2009  . 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 Atrial fibrillation ([place]) 02/10/2009   Outpatient Encounter Prescriptions as of 10/07/2016  Medication Sig  . albuterol (PROVENTIL) (2.5 MG/3ML) 0.083% nebulizer solution Take 3 mLs (2.5 mg total) by nebulization every 6  (six) hours as needed for wheezing or shortness of breath.  Marland Kitchen amLODipine (NORVASC) 5 MG tablet TAKE ONE TABLET BY MOUTH ONCE DAILY  . atorvastatin (LIPITOR) 80 MG tablet TAKE ONE TABLET BY MOUTH DAILY  . azelastine (ASTELIN) 0.1 % nasal spray USE ONE SPRAY IN EACH NOSTRIL TWICE DAILY  . BREO ELLIPTA 100-25 MCG/INH AEPB Take 1 puff by mouth daily.  . Cholecalciferol (VITAMIN D-3) 1000 units CAPS Take 1 capsule by mouth daily.  Marland Kitchen diltiazem (CARDIZEM) 30 MG tablet Take 1 tablet (30 mg total) by mouth 2 (two) times daily.  . fentaNYL (DURAGESIC - DOSED MCG/HR) 50 MCG/HR Place 50 mcg onto the skin every 3 (three) days.  . fluticasone (FLONASE) 50 MCG/ACT nasal spray USE 2 SPRAYS IN EACH NOSTRIL ONCE DAILY. SHAKE GENTLY BEFORE USING.  Marland Kitchen guaiFENesin (MUCINEX) 600 MG 12 hr tablet Take 1 tablet (600 mg total) by mouth 2 (two) times daily as needed.  . INCRUSE ELLIPTA 62.5 MCG/INH AEPB Take 1 puff by mouth daily.  Marland Kitchen loratadine (CLARITIN) 10 MG tablet Take 10 mg by mouth daily as needed.   . Multiple Vitamins-Minerals (CENTRUM SILVER PO) Take 1 tablet by mouth daily.    . NON FORMULARY Oxygen 3L/min   . oxyCODONE-acetaminophen (PERCOCET) 5-325 MG per tablet Take 2 tablets by mouth every 8 (eight) hours as needed.    Marland Kitchen PARoxetine (PAXIL) 40 MG tablet TAKE 1 AND 1/2 TABLETS BY MOUTH DAILY.  Marland Kitchen potassium chloride (K-DUR,KLOR-CON) 10 MEQ tablet TAKE ONE TABLET BY MOUTH DAILY.  Marland Kitchen PROAIR HFA 108 (90 Base) MCG/ACT inhaler INHALE 1-2  PUFFS EVERY 4 TO 6 HOURS AS NEEDED FOR SHORTNESS OF BREATH.  . torsemide (DEMADEX) 20 MG tablet TAKE 1 AND 1/2 TABLETS BY MOUTH ONCE DAILY.  Marland Kitchen warfarin (COUMADIN) 5 MG tablet TAKE 1 TO 2 TABLETS BY MOUTH AS DIRECTED BY ANTICOAGULATION CLINIC.   No facility-administered encounter medications on file as of 10/07/2016.       Review of Systems  Constitutional: Negative.   HENT: Negative.   Eyes: Negative.   Respiratory: Negative.   Cardiovascular: Negative.   Gastrointestinal:  Negative.   Endocrine: Negative.   Genitourinary: Negative.   Musculoskeletal: Negative.   Skin: Negative.   Allergic/Immunologic: Negative.   Neurological: Negative.   Hematological: Negative.   Psychiatric/Behavioral: Negative.        Objective:   Physical Exam  Constitutional: She is oriented to person, place, and time. She appears well-developed and well-nourished. No distress.  The patient is pleasant and alert with her oxygen at 2 L/m and an O2 level at 97%.  HENT:  Head: Normocephalic and atraumatic.  Right Ear: External ear normal.  Left Ear: External ear normal.  Nose: Nose normal.  Mouth/Throat: Oropharynx is clear and moist. No oropharyngeal exudate.  Eyes: Conjunctivae and EOM are normal. Pupils are equal, round, and reactive to light. Right eye exhibits no discharge. Left eye exhibits no discharge. No scleral icterus.  Neck: Normal range of motion. Neck supple. No thyromegaly present.  Cardiovascular: Normal rate, normal heart sounds and intact distal pulses.   No murmur heard. Heart is irregular irregular at 84/m  Pulmonary/Chest: Effort normal. No respiratory distress. She has wheezes. She has no rales. She exhibits no tenderness.  Diminished breath sounds bilaterally and and wheezy with coughing.  Abdominal: Soft. Bowel sounds are normal. She exhibits distension. She exhibits no mass. There is tenderness. There is no rebound and no guarding.  Musculoskeletal: Normal range of motion. She exhibits no edema.  The patient's mobility is limited due to her chronic back pain.  Lymphadenopathy:    She has no cervical adenopathy.  Neurological: She is alert and oriented to person, place, and time. She has normal reflexes. No cranial nerve deficit.  Skin: Skin is warm and dry. No rash noted.  Psychiatric: She has a normal mood and affect. Her behavior is normal. Judgment and thought content normal.  Nursing note and vitals reviewed.  BP 123/79 (BP Location: Left Arm)    Pulse 99   Temp 97.6 F (36.4 C) (Oral)   Ht '5\' 4"'  (1.626 m)   Wt 159 lb (72.1 kg)   SpO2 97% Comment: on 2 liters  BMI 27.29 kg/m         Assessment & Plan:  1. Essential hypertension -The blood pressure is good today and she will continue with current treatment - BMP8+EGFR - CBC with Differential/Platelet - Hepatic function panel  2. Pure hypercholesterolemia -Continue with atorvastatin and as aggressive therapeutic lifestyle changes as possible - CBC with Differential/Platelet - Lipid panel  3. Vitamin D deficiency -Continue with vitamin D replacement pending results of lab work - CBC with Differential/Platelet - VITAMIN D 25 Hydroxy (Vit-D Deficiency, Fractures)  4. Chronic low back pain with sciatica, sciatica laterality unspecified, unspecified back pain laterality -Follow-up with neurosurgery and continue with pain patches as recommended by the neurosurgeon - CBC with Differential/Platelet  5. SOB (shortness of breath) -Continue with current breathing treatments and follow-up with pulmonology - CBC with Differential/Platelet  6. Permanent atrial fibrillation [facility]) -Follow-up with cardiology as planned and continue  with Coumadin as recommended by clinical pharmacy - CBC with Differential/Platelet  7. Pulmonary emphysema, unspecified emphysema type ([place]) -Follow-up with pulmonology and continue with O2 and current breathing treatments  No orders of the defined types were placed in this encounter.  Patient Instructions                       Medicare Annual Wellness Visit  [facility] and the medical providers at [facility] strive to bring you the best medical care.  In doing so we not only want to address your current medical conditions and concerns but also to detect new conditions early and prevent illness, disease and health-related problems.    Medicare offers a yearly Wellness Visit which allows our clinical staff to assess your  need for preventative services including immunizations, lifestyle education, counseling to decrease risk of preventable diseases and screening for fall risk and other medical concerns.    This visit is provided free of charge (no copay) for all Medicare recipients. The clinical pharmacists at [facility] have begun to conduct these Wellness Visits which will also include a thorough review of all your medications.    As you primary medical provider recommend that you make an appointment for your Annual Wellness Visit if you have not done so already this year.  You may set up this appointment before you leave today or you may call back ([phone]) and schedule an appointment.  Please make sure when you call that you mention that you are scheduling your Annual Wellness Visit with the clinical pharmacist so that the appointment may be made for the proper length of time.     Continue current medications. Continue good therapeutic lifestyle changes which include good diet and exercise. Fall precautions discussed with patient. If an FOBT was given today- please return it to our front desk. If you are over 69 years old - you may need Prevnar 48 or the adult Pneumonia vaccine.  **Flu shots are available--- please call and schedule a FLU-CLINIC appointment**  After your visit with [place] today you will receive a survey in the mail or online from [employer] regarding your care with [place]. Please take a moment to fill this out. Your feedback is very important to [place] as you can help [place] better understand your patient needs as well as improve your experience and satisfaction. WE CARE ABOUT YOU!!!   Continue follow-up with neurosurgery pulmonology and cardiology Get pro times checked as needed and as directed by the clinical pharmacist Continue to drink plenty of fluids and stay well hydrated   Arrie Senate MD

## 2016-10-08 LAB — CBC WITH DIFFERENTIAL/PLATELET
BASOS: 0 %
Basophils Absolute: 0 10*3/uL (ref 0.0–0.2)
EOS (ABSOLUTE): 0.2 10*3/uL (ref 0.0–0.4)
EOS: 2 %
Hematocrit: 45.2 % (ref 34.0–46.6)
Hemoglobin: 13.9 g/dL (ref 11.1–15.9)
Immature Grans (Abs): 0 10*3/uL (ref 0.0–0.1)
Immature Granulocytes: 0 %
LYMPHS ABS: 1.9 10*3/uL (ref 0.7–3.1)
LYMPHS: 19 %
MCH: 28.8 pg (ref 26.6–33.0)
MCHC: 30.8 g/dL — ABNORMAL LOW (ref 31.5–35.7)
MCV: 94 fL (ref 79–97)
MONOCYTES: 9 %
MONOS ABS: 0.9 10*3/uL (ref 0.1–0.9)
NEUTROS ABS: 7.1 10*3/uL — AB (ref 1.4–7.0)
NEUTROS PCT: 70 %
PLATELETS: 269 10*3/uL (ref 150–379)
RBC: 4.82 x10E6/uL (ref 3.77–5.28)
RDW: 15.4 % (ref 12.3–15.4)
WBC: 10.2 10*3/uL (ref 3.4–10.8)

## 2016-10-08 LAB — LIPID PANEL
Chol/HDL Ratio: 3.5 ratio units (ref 0.0–4.4)
Cholesterol, Total: 171 mg/dL (ref 100–199)
HDL: 49 mg/dL (ref >39)
LDL Calculated: 53 mg/dL (ref 0–99)
TRIGLYCERIDES: 347 mg/dL — AB (ref 0–149)
VLDL Cholesterol Cal: 69 mg/dL — ABNORMAL HIGH (ref 5–40)

## 2016-10-08 LAB — BMP8+EGFR
BUN/Creatinine Ratio: 17 (ref 12–28)
BUN: 27 mg/dL (ref 8–27)
CALCIUM: 10 mg/dL (ref 8.7–10.3)
CO2: 28 mmol/L (ref 18–29)
CREATININE: 1.59 mg/dL — AB (ref 0.57–1.00)
Chloride: 98 mmol/L (ref 96–106)
GFR calc Af Amer: 37 mL/min/{1.73_m2} — ABNORMAL LOW (ref >59)
GFR calc non Af Amer: 32 mL/min/{1.73_m2} — ABNORMAL LOW (ref >59)
GLUCOSE: 94 mg/dL (ref 65–99)
Potassium: 5.4 mmol/L — ABNORMAL HIGH (ref 3.5–5.2)
Sodium: 141 mmol/L (ref 134–144)

## 2016-10-08 LAB — HEPATIC FUNCTION PANEL
ALT: 22 IU/L (ref 0–32)
AST: 21 IU/L (ref 0–40)
Albumin: 4.3 g/dL (ref 3.5–4.8)
Alkaline Phosphatase: 89 IU/L (ref 39–117)
Bilirubin Total: 0.3 mg/dL (ref 0.0–1.2)
Bilirubin, Direct: 0.09 mg/dL (ref 0.00–0.40)
Total Protein: 6.1 g/dL (ref 6.0–8.5)

## 2016-10-08 LAB — VITAMIN D 25 HYDROXY (VIT D DEFICIENCY, FRACTURES): VIT D 25 HYDROXY: 30.8 ng/mL (ref 30.0–100.0)

## 2016-10-11 ENCOUNTER — Other Ambulatory Visit: Payer: Self-pay | Admitting: Family Medicine

## 2016-10-18 ENCOUNTER — Ambulatory Visit (INDEPENDENT_AMBULATORY_CARE_PROVIDER_SITE_OTHER): Payer: Medicare Other | Admitting: Pharmacist

## 2016-10-18 DIAGNOSIS — I482 Chronic atrial fibrillation: Secondary | ICD-10-CM | POA: Diagnosis not present

## 2016-10-18 DIAGNOSIS — Z7901 Long term (current) use of anticoagulants: Secondary | ICD-10-CM | POA: Diagnosis not present

## 2016-10-18 LAB — POCT INR: INR: 1.8

## 2016-10-18 NOTE — Progress Notes (Signed)
Subjective:     Indication: atrial fibrillation Bleeding signs/symptoms: None Thromboembolic signs/symptoms: None  Missed Coumadin doses: None Medication changes: no Dietary changes: no Bacterial/viral infection: no Other concerns: no   Objective:    INR Today: 1.8 Current dose: warfarin 5mg  tablets - takes 1 and 1/2 tablet on mondays and fridays.  Take 1 tablet all other days.    Assessment:    Subtherapeutic INR for goal of 2-3   Plan:    1. New dose: take 1 and 1/2 tablets today - Tuesday, April 3rd.  The restart usual warfarin dose   2. Next INR: 1 week    Patient checks INR at home with Home INR monitoring.  Billing once per month interupertation fee.  Patient diagnosis - atrial fibrillation Procedure code if G0250

## 2016-10-27 ENCOUNTER — Ambulatory Visit: Payer: Self-pay | Admitting: Pharmacist

## 2016-10-27 ENCOUNTER — Other Ambulatory Visit: Payer: Self-pay | Admitting: Family Medicine

## 2016-10-27 ENCOUNTER — Telehealth: Payer: Self-pay | Admitting: Family Medicine

## 2016-10-27 LAB — POCT INR: INR: 2

## 2016-10-27 MED ORDER — LORATADINE 10 MG PO TABS: 10.0000 mg | ORAL_TABLET | Freq: Every day | ORAL | 11 refills | Status: DC

## 2016-10-27 NOTE — Telephone Encounter (Signed)
She has always had issues and swelling with her left arm - can you address her sore throat?  s/s- slight cough, slight congestion, cold sweats - no known fever, no gi issues Wants something for chest congestion and ST.  MMM please address. [facility].

## 2016-10-27 NOTE — Telephone Encounter (Signed)
Pt aware  - rx sent so they will deliver

## 2016-10-27 NOTE — Telephone Encounter (Signed)
Probably allergies- OTC allrgy meds- if no bettr let me know

## 2016-10-27 NOTE — Telephone Encounter (Signed)
What symptoms do you have? Sore throat and left arm hurts  How long have you been sick? Since last night  Have you been seen for this problem? no  If your provider decides to give you a prescription, which pharmacy would you like for it to be sent to? [facility]   Patient informed that this information will be sent to the clinical staff for review and that they should receive a follow up call.

## 2016-10-31 ENCOUNTER — Other Ambulatory Visit: Payer: Self-pay | Admitting: Cardiology

## 2016-10-31 ENCOUNTER — Ambulatory Visit (INDEPENDENT_AMBULATORY_CARE_PROVIDER_SITE_OTHER): Payer: Self-pay | Admitting: Pharmacist

## 2016-10-31 LAB — PROTIME-INR

## 2016-10-31 LAB — POCT INR: INR: 2.4

## 2016-10-31 NOTE — Progress Notes (Signed)
home

## 2016-11-04 DIAGNOSIS — J449 Chronic obstructive pulmonary disease, unspecified: Secondary | ICD-10-CM | POA: Diagnosis not present

## 2016-11-07 LAB — POCT INR: INR: 2.6

## 2016-11-09 ENCOUNTER — Ambulatory Visit (INDEPENDENT_AMBULATORY_CARE_PROVIDER_SITE_OTHER): Payer: Self-pay | Admitting: Pharmacist

## 2016-11-09 NOTE — Progress Notes (Signed)
Patient ID: Melinda Collins, female   DOB: [birth year], 76 y.o.   MRN: [id number] No charge for labs or visit - INR check through home monitoring system

## 2016-11-12 ENCOUNTER — Other Ambulatory Visit: Payer: Self-pay | Admitting: Family Medicine

## 2016-11-14 ENCOUNTER — Ambulatory Visit (INDEPENDENT_AMBULATORY_CARE_PROVIDER_SITE_OTHER): Payer: Self-pay | Admitting: Pharmacist

## 2016-11-14 DIAGNOSIS — Z7901 Long term (current) use of anticoagulants: Secondary | ICD-10-CM | POA: Diagnosis not present

## 2016-11-14 DIAGNOSIS — I482 Chronic atrial fibrillation: Secondary | ICD-10-CM | POA: Diagnosis not present

## 2016-11-14 LAB — POCT INR: INR: 2

## 2016-11-16 ENCOUNTER — Telehealth: Payer: Self-pay | Admitting: Family Medicine

## 2016-11-16 MED ORDER — PREDNISONE 10 MG PO TABS
ORAL_TABLET | ORAL | 0 refills | Status: DC
Start: 2016-11-16 — End: 2017-02-08

## 2016-11-16 NOTE — Telephone Encounter (Signed)
What symptoms do you have? Increase and swelling in left hip  How long have you been sick? 2 days  Have you been seen for this problem? yes  If your provider decides to give you a prescription, which pharmacy would you like for it to be sent to? [facility]    Patient informed that this information will be sent to the clinical staff for review and that they should receive a follow up call.

## 2016-11-16 NOTE — Telephone Encounter (Signed)
Spoke with patient's daughter in Teaching laboratory technician.  She states patient is having hip pain and swelling similar to what she has experienced with sciatica in the past.  She is not using her fentanyl patch because she says it does not work.  She is taking the percocet for pain.  Her daughter in law states she thinks she has done something to cause a flare up, and prednisone has helped with this in the past.  Will send to Dr. Laurance Flatten to advise.

## 2016-11-16 NOTE — Telephone Encounter (Signed)
Please do prednisone 10 Taper #20 and make sure that it does not irritate his stomach or cause any increased problems with leading per rectum. If she does not get better she will need to make an appointment and be seen.

## 2016-11-16 NOTE — Telephone Encounter (Signed)
Daughter aware by VM - med sent to pharm and instructions

## 2016-11-20 LAB — POCT INR: INR: 2.4

## 2016-11-21 DIAGNOSIS — H40023 Open angle with borderline findings, high risk, bilateral: Secondary | ICD-10-CM | POA: Diagnosis not present

## 2016-11-21 DIAGNOSIS — H26493 Other secondary cataract, bilateral: Secondary | ICD-10-CM | POA: Diagnosis not present

## 2016-11-23 ENCOUNTER — Ambulatory Visit (INDEPENDENT_AMBULATORY_CARE_PROVIDER_SITE_OTHER): Payer: Self-pay | Admitting: Pharmacist

## 2016-11-23 NOTE — Consult Note (Signed)
No charge for labs or visit - INR check through home monitoring system  

## 2016-11-25 DIAGNOSIS — M4716 Other spondylosis with myelopathy, lumbar region: Secondary | ICD-10-CM | POA: Diagnosis not present

## 2016-12-04 DIAGNOSIS — J449 Chronic obstructive pulmonary disease, unspecified: Secondary | ICD-10-CM | POA: Diagnosis not present

## 2016-12-12 NOTE — Progress Notes (Deleted)
Cardiology Office Note   Date:  12/12/2016   ID:  Melinda Collins, DOB 07-Jan-1941, MRN [id number]  PCP:  Chipper Herb, MD  Cardiologist:   [name], MD  Referring:  Dr. Laurance Flatten  No chief complaint on file.     History of Present Illness: Melinda Collins is a 76 y.o. female who presents for evaluation of dyspnea and atrial fib.  ***  The last time I saw her she had increased dyspnea.  At the last visit I thought this was probably related to acute on chronic lung disease.  She was started on Cardizem IR while Dr. Luan Pulling treated her lung disease.  She had a relatively controlled rate other worsening spikes with activity. She returns for follow-up.   She's had her breathing medications changed.  Her daughter thinks she is breathing better. She is having fewer palpitations and dizzy spells and she was having. It may happen occasionally for couple of minutes but since starting Cardizem she's done better. She takes Mucinex and that helps her clear her cough. She's not having any presyncope or syncope. She denies any chest pressure, neck or arm discomfort. He's had no weight gain or edema. She's very limited by her chronic lung disease and is on chronic oxygen.   Past Medical History:  Diagnosis Date  . Arthritis   . COPD (chronic obstructive pulmonary disease) (HCC)    spirometry 04/20/10 FEV1 1.38 (58%) with ration 70  . Depression   . Diastolic dysfunction    noted on echo w/some apparent volume issues in past but not severe   . HLD (hyperlipidemia)   . HOH (hard of hearing)   . HTN (hypertension)    mild; pulmonary  . Permanent atrial fibrillation ([place])   . Ruptured disk   . Tobacco abuse   . Uterine cancer [facility])     Past Surgical History:  Procedure Laterality Date  . ABDOMINAL HYSTERECTOMY     total  . CATARACT EXTRACTION W/PHACO Left 08/25/2014   Procedure: CATARACT EXTRACTION PHACO AND INTRAOCULAR LENS PLACEMENT (IOC);  Surgeon: Tonny Branch, MD;  Location: [facility];  Service: Ophthalmology;  Laterality: Left;  CDE 6.95  . CATARACT EXTRACTION W/PHACO Right 09/11/2014   Procedure: CATARACT EXTRACTION PHACO AND INTRAOCULAR LENS PLACEMENT (IOC);  Surgeon: Tonny Branch, MD;  Location: [facility] ORS;  Service: Ophthalmology;  Laterality: Right;  CDE:8.11  . CERVICAL DISC SURGERY     x2  . KYPHOPLASTY    . SPINE SURGERY  2013   cervical fusion     Current Outpatient Prescriptions  Medication Sig Dispense Refill  . albuterol (PROVENTIL) (2.5 MG/3ML) 0.083% nebulizer solution INHALE CONTENTS OF 1 VIAL IN NEBULIZER EVERY 6 HOURS AS NEEDED FOR FOR WHEEZING OR SHORTNESS OF BREATH. 180 mL 5  . amLODipine (NORVASC) 5 MG tablet TAKE ONE TABLET BY MOUTH ONCE DAILY 30 tablet 5  . atorvastatin (LIPITOR) 80 MG tablet TAKE ONE TABLET BY MOUTH DAILY 30 tablet 1  . azelastine (ASTELIN) 0.1 % nasal spray USE ONE SPRAY IN EACH NOSTRIL TWICE DAILY 30 mL 1  . BREO ELLIPTA 100-25 MCG/INH AEPB Take 1 puff by mouth daily.  0  . Cholecalciferol (VITAMIN D-3) 1000 units CAPS Take 1 capsule by mouth daily.    Marland Kitchen diltiazem (CARDIZEM) 30 MG tablet TAKE ONE TABLET BY MOUTH TWICE DAILY 60 tablet 1  . fentaNYL (DURAGESIC - DOSED MCG/HR) 50 MCG/HR Place 50 mcg onto the skin every 3 (three) days.    Marland Kitchen  fluticasone (FLONASE) 50 MCG/ACT nasal spray USE 2 SPRAYS IN EACH NOSTRIL ONCE DAILY. SHAKE GENTLY BEFORE USING. 48 g 1  . guaiFENesin (MUCINEX) 600 MG 12 hr tablet Take 1 tablet (600 mg total) by mouth 2 (two) times daily as needed. 60 tablet 1  . INCRUSE ELLIPTA 62.5 MCG/INH AEPB Take 1 puff by mouth daily.  0  . loratadine (CLARITIN) 10 MG tablet Take 1 tablet (10 mg total) by mouth daily. 30 tablet 11  . Multiple Vitamins-Minerals (CENTRUM SILVER PO) Take 1 tablet by mouth daily.      . NON FORMULARY Oxygen 3L/min     . oxyCODONE-acetaminophen (PERCOCET) 5-325 MG per tablet Take 2 tablets by mouth every 8 (eight) hours as needed.      Marland Kitchen PARoxetine (PAXIL) 40 MG tablet TAKE 1 AND 1/2 TABLETS BY  MOUTH DAILY. 45 tablet 2  . potassium chloride (K-DUR,KLOR-CON) 10 MEQ tablet TAKE ONE TABLET BY MOUTH DAILY. 30 tablet 2  . predniSONE (DELTASONE) 10 MG tablet Take 1 tab QID x 2 days, then 1 tab TID x 2 days, then 1 tab BID x 2 days, then 1 tab QD x 2 days. 20 tablet 0  . PROAIR HFA 108 (90 Base) MCG/ACT inhaler INHALE 1-2 PUFFS EVERY 4 TO 6 HOURS AS NEEDED FOR SHORTNESS OF BREATH. 8.5 g 11  . torsemide (DEMADEX) 20 MG tablet TAKE 1 AND 1/2 TABLETS BY MOUTH ONCE DAILY. 45 tablet 3  . warfarin (COUMADIN) 5 MG tablet TAKE 1 TO 2 TABLETS BY MOUTH AS DIRECTED BY ANTICOAGULATION CLINIC. 60 tablet 3   No current facility-administered medications for this visit.     Allergies:   Niaspan [niacin]    ROS:  Please see the history of present illness.   Otherwise, review of systems are positive for ***.   All other systems are reviewed and negative.    PHYSICAL EXAM: VS:  There were no vitals taken for this visit. , BMI There is no height or weight on file to calculate BMI.  GENERAL:  Well appearing HEENT:  Pupils equal round and reactive, fundi not visualized, oral mucosa unremarkable NECK:  No jugular venous distention, waveform within normal limits, carotid upstroke brisk and symmetric, no bruits, no thyromegaly LYMPHATICS:  No cervical, inguinal adenopathy LUNGS:  Clear to auscultation bilaterally BACK:  No CVA tenderness CHEST:  Unremarkable HEART:  PMI not displaced or sustained,S1 and S2 within normal limits, no S3, no S4, no clicks, no rubs, *** murmurs ABD:  Flat, positive bowel sounds normal in frequency in pitch, no bruits, no rebound, no guarding, no midline pulsatile mass, no hepatomegaly, no splenomegaly EXT:  2 plus pulses throughout, no edema, no cyanosis no clubbing SKIN:  No rashes no nodules NEURO:  Cranial nerves II through XII grossly intact, motor grossly intact throughout PSYCH:  Cognitively intact, oriented to person place and time  GENERAL:  Well appearing NECK:  No  jugular venous distention, waveform within normal limits, carotid upstroke brisk and symmetric, no bruits, no thyromegaly LUNGS:  Clear to auscultation bilaterally BACK:  No CVA tenderness CHEST:  Unremarkable HEART:  PMI not displaced or sustained,S1 and S2 within normal limits, no S3, no clicks, no rubs, no murmurs, irregular ABD:  Flat, positive bowel sounds normal in frequency in pitch, no bruits, no rebound, no guarding, no midline pulsatile mass, no hepatomegaly, no splenomegaly EXT:  2 plus pulses throughout, no edema, no cyanosis no clubbing   EKG:  EKG is ***not ordered today. ***  Recent Labs: 10/07/2016: ALT 22; BUN 27; Creatinine, Ser 1.59; Platelets 269; Potassium 5.4; Sodium 141    Lipid Panel    Component Value Date/Time   CHOL 171 10/07/2016 1620   CHOL 121 08/06/2013 1123   TRIG 347 (H) 10/07/2016 1620   TRIG 302 (H) 02/16/2016 1247   TRIG 136 08/06/2013 1123   HDL 49 10/07/2016 1620   HDL 51 02/16/2016 1247   HDL 48 08/06/2013 1123   CHOLHDL 3.5 10/07/2016 1620   LDLCALC 53 10/07/2016 1620   LDLCALC 46 08/06/2013 1123      Wt Readings from Last 3 Encounters:  10/07/16 159 lb (72.1 kg)  08/10/16 166 lb (75.3 kg)  06/07/16 160 lb (72.6 kg)      Other studies Reviewed: Additional studies/ records that were reviewed today include:  *** Review of the above records demonstrates:     ASSESSMENT AND PLAN:  ATRIAL FIB:   This has been permanent.  Melinda Collins has a CHA2DS2 - VASc score of 3.  ***  DYSPNEA:  ***  She is managed for chronic lung disease seems to be improved.  CORONARY CALCIUM:  *** We have managed this medically.    TOBACCO ABUSE:  *** She admits she is still smoking and she will not quit.   Current medicines are reviewed at length with the patient today.  The patient does not have concerns regarding medicines.  The following changes have been made:  ***  Labs/ tests ordered today include:  ***  No orders of the defined  types were placed in this encounter.    Disposition:   FU with me in ***months.     Signed, [name], MD  12/12/2016 9:39 AM    [facility]

## 2016-12-14 ENCOUNTER — Other Ambulatory Visit: Payer: Self-pay | Admitting: Family Medicine

## 2016-12-14 ENCOUNTER — Ambulatory Visit: Payer: Medicare Other | Admitting: Cardiology

## 2016-12-15 ENCOUNTER — Ambulatory Visit (INDEPENDENT_AMBULATORY_CARE_PROVIDER_SITE_OTHER): Payer: Self-pay | Admitting: Pharmacist

## 2016-12-15 LAB — POCT INR: INR: 2.6

## 2016-12-22 DIAGNOSIS — Z7901 Long term (current) use of anticoagulants: Secondary | ICD-10-CM | POA: Diagnosis not present

## 2016-12-22 DIAGNOSIS — I482 Chronic atrial fibrillation: Secondary | ICD-10-CM | POA: Diagnosis not present

## 2016-12-26 ENCOUNTER — Ambulatory Visit (INDEPENDENT_AMBULATORY_CARE_PROVIDER_SITE_OTHER): Payer: Self-pay | Admitting: Pharmacist

## 2016-12-26 LAB — POCT INR: INR: 2.5

## 2017-01-03 DIAGNOSIS — H40013 Open angle with borderline findings, low risk, bilateral: Secondary | ICD-10-CM | POA: Diagnosis not present

## 2017-01-03 DIAGNOSIS — H353121 Nonexudative age-related macular degeneration, left eye, early dry stage: Secondary | ICD-10-CM | POA: Diagnosis not present

## 2017-01-03 DIAGNOSIS — Z961 Presence of intraocular lens: Secondary | ICD-10-CM | POA: Diagnosis not present

## 2017-01-03 DIAGNOSIS — H26493 Other secondary cataract, bilateral: Secondary | ICD-10-CM | POA: Diagnosis not present

## 2017-01-04 DIAGNOSIS — J449 Chronic obstructive pulmonary disease, unspecified: Secondary | ICD-10-CM | POA: Diagnosis not present

## 2017-01-04 LAB — POCT INR: INR: 2.9

## 2017-01-05 ENCOUNTER — Ambulatory Visit: Payer: Self-pay | Admitting: Pharmacist

## 2017-01-05 NOTE — Progress Notes (Signed)
Patient ID: Melinda Collins, female   DOB: Jan 24, 1941, 76 y.o.   MRN: [id number]   No charge for labs or visit - INR check through home monitoring system

## 2017-01-11 ENCOUNTER — Ambulatory Visit (INDEPENDENT_AMBULATORY_CARE_PROVIDER_SITE_OTHER): Payer: Self-pay | Admitting: Pharmacist

## 2017-01-11 LAB — POCT INR: INR: 2.5

## 2017-01-12 ENCOUNTER — Other Ambulatory Visit: Payer: Self-pay | Admitting: Family Medicine

## 2017-01-20 ENCOUNTER — Telehealth: Payer: Self-pay | Admitting: *Deleted

## 2017-01-20 ENCOUNTER — Other Ambulatory Visit: Payer: Self-pay | Admitting: Cardiology

## 2017-01-20 DIAGNOSIS — Z7901 Long term (current) use of anticoagulants: Secondary | ICD-10-CM | POA: Diagnosis not present

## 2017-01-20 DIAGNOSIS — I482 Chronic atrial fibrillation: Secondary | ICD-10-CM | POA: Diagnosis not present

## 2017-01-20 NOTE — Telephone Encounter (Signed)
Please have Melinda Collins see if she qualifies for any of these requests. I believe that she should because of her severe COPD, atrial fibrillation and peripheral neuropathy from her degenerative disc disease

## 2017-01-20 NOTE — Telephone Encounter (Signed)
Please advise 

## 2017-01-20 NOTE — Telephone Encounter (Signed)
Pt is asking for help at home Needs help cooking, cleaning and ADLs Please advise

## 2017-01-24 ENCOUNTER — Encounter: Payer: Self-pay | Admitting: *Deleted

## 2017-01-24 NOTE — Telephone Encounter (Signed)
Form sent to [facility] for pt, she is aware

## 2017-01-24 NOTE — Progress Notes (Deleted)
Cardiology Office Note   Date:  01/24/2017   ID:  Melinda Collins, DOB 29-Aug-1940, MRN [id number]  PCP:  Chipper Herb, MD  Cardiologist:   [name], MD  Referring:  Dr. Laurance Flatten  No chief complaint on file.     History of Present Illness: Melinda Collins is a 76 y.o. female who presents for evaluation of dyspnea and atrial fib.  Since I last saw her ***   The last time I saw her she had increased dyspnea.  At the last visit I thought this was probably related to acute on chronic lung disease.  She was started on Cardizem IR while Dr. Luan Pulling treated her lung disease.  She had a relatively controlled rate other worsening spikes with activity. She returns for follow-up.   She's had her breathing medications changed.  Her daughter thinks she is breathing better. She is having fewer palpitations and dizzy spells and she was having. It may happen occasionally for couple of minutes but since starting Cardizem she's done better. She takes Mucinex and that helps her clear her cough. She's not having any presyncope or syncope. She denies any chest pressure, neck or arm discomfort. He's had no weight gain or edema. She's very limited by her chronic lung disease and is on chronic oxygen.   Past Medical History:  Diagnosis Date  . Arthritis   . COPD (chronic obstructive pulmonary disease) (HCC)    spirometry 04/20/10 FEV1 1.38 (58%) with ration 70  . Depression   . Diastolic dysfunction    noted on echo w/some apparent volume issues in past but not severe   . HLD (hyperlipidemia)   . HOH (hard of hearing)   . HTN (hypertension)    mild; pulmonary  . Permanent atrial fibrillation ([place])   . Ruptured disk   . Tobacco abuse   . Uterine cancer [facility])     Past Surgical History:  Procedure Laterality Date  . ABDOMINAL HYSTERECTOMY     total  . CATARACT EXTRACTION W/PHACO Left 08/25/2014   Procedure: CATARACT EXTRACTION PHACO AND INTRAOCULAR LENS PLACEMENT (IOC);  Surgeon: Tonny Branch, MD;  Location: [facility]S;  Service: Ophthalmology;  Laterality: Left;  CDE 6.95  . CATARACT EXTRACTION W/PHACO Right 09/11/2014   Procedure: CATARACT EXTRACTION PHACO AND INTRAOCULAR LENS PLACEMENT (IOC);  Surgeon: Tonny Branch, MD;  Location: [facility]S;  Service: Ophthalmology;  Laterality: Right;  CDE:8.11  . CERVICAL DISC SURGERY     x2  . KYPHOPLASTY    . SPINE SURGERY  2013   cervical fusion     Current Outpatient Prescriptions  Medication Sig Dispense Refill  . albuterol (PROVENTIL) (2.5 MG/3ML) 0.083% nebulizer solution INHALE CONTENTS OF 1 VIAL IN NEBULIZER EVERY 6 HOURS AS NEEDED FOR FOR WHEEZING OR SHORTNESS OF BREATH. 180 mL 5  . amLODipine (NORVASC) 5 MG tablet TAKE ONE TABLET BY MOUTH ONCE DAILY 30 tablet 5  . atorvastatin (LIPITOR) 80 MG tablet TAKE ONE TABLET BY MOUTH DAILY 30 tablet 3  . azelastine (ASTELIN) 0.1 % nasal spray USE ONE SPRAY IN EACH NOSTRIL TWICE DAILY 30 mL 1  . BREO ELLIPTA 100-25 MCG/INH AEPB Take 1 puff by mouth daily.  0  . Cholecalciferol (VITAMIN D-3) 1000 units CAPS Take 1 capsule by mouth daily.    Marland Kitchen diltiazem (CARDIZEM) 30 MG tablet TAKE ONE TABLET BY MOUTH TWICE DAILY 60 tablet 0  . fentaNYL (DURAGESIC - DOSED MCG/HR) 50 MCG/HR Place 50 mcg onto the skin every  3 (three) days.    . fluticasone (FLONASE) 50 MCG/ACT nasal spray USE 2 SPRAYS IN EACH NOSTRIL ONCE DAILY. SHAKE GENTLY BEFORE USING. 48 g 3  . guaiFENesin (MUCINEX) 600 MG 12 hr tablet Take 1 tablet (600 mg total) by mouth 2 (two) times daily as needed. 60 tablet 1  . INCRUSE ELLIPTA 62.5 MCG/INH AEPB Take 1 puff by mouth daily.  0  . loratadine (CLARITIN) 10 MG tablet Take 1 tablet (10 mg total) by mouth daily. 30 tablet 11  . Multiple Vitamins-Minerals (CENTRUM SILVER PO) Take 1 tablet by mouth daily.      . NON FORMULARY Oxygen 3L/min     . oxyCODONE-acetaminophen (PERCOCET) 5-325 MG per tablet Take 2 tablets by mouth every 8 (eight) hours as needed.      Marland Kitchen PARoxetine (PAXIL) 40 MG tablet  TAKE 1 AND 1/2 TABLETS BY MOUTH DAILY. 45 tablet 2  . potassium chloride (K-DUR,KLOR-CON) 10 MEQ tablet TAKE ONE TABLET BY MOUTH DAILY. 30 tablet 2  . predniSONE (DELTASONE) 10 MG tablet Take 1 tab QID x 2 days, then 1 tab TID x 2 days, then 1 tab BID x 2 days, then 1 tab QD x 2 days. 20 tablet 0  . PROAIR HFA 108 (90 Base) MCG/ACT inhaler INHALE 1-2 PUFFS EVERY 4 TO 6 HOURS AS NEEDED FOR SHORTNESS OF BREATH. 8.5 g 11  . torsemide (DEMADEX) 20 MG tablet TAKE 1 AND 1/2 TABLETS BY MOUTH ONCE DAILY. 45 tablet 3  . warfarin (COUMADIN) 5 MG tablet TAKE 1 TO 2 TABLETS BY MOUTH AS DIRECTED BY ANTICOAGULATION CLINIC. 60 tablet 3   No current facility-administered medications for this visit.     Allergies:   Niaspan [niacin]    ROS:  Please see the history of present illness.   Otherwise, review of systems are positive for ***.   All other systems are reviewed and negative.    PHYSICAL EXAM: VS:  There were no vitals taken for this visit. , BMI There is no height or weight on file to calculate BMI.  GENERAL:  Well appearing NECK:  No jugular venous distention, waveform within normal limits, carotid upstroke brisk and symmetric, no bruits, no thyromegaly LUNGS:  Clear to auscultation bilaterally CHEST:  Unremarkable HEART:  PMI not displaced or sustained,S1 and S2 within normal limits, no S3, no S4, no clicks, no rubs, *** murmurs ABD:  Flat, positive bowel sounds normal in frequency in pitch, no bruits, no rebound, no guarding, no midline pulsatile mass, no hepatomegaly, no splenomegaly EXT:  2 plus pulses throughout, no edema, no cyanosis no clubbing   GENERAL:  Well appearing NECK:  No jugular venous distention, waveform within normal limits, carotid upstroke brisk and symmetric, no bruits, no thyromegaly LUNGS:  Clear to auscultation bilaterally BACK:  No CVA tenderness CHEST:  Unremarkable HEART:  PMI not displaced or sustained,S1 and S2 within normal limits, no S3, no clicks, no rubs, no  murmurs, irregular ABD:  Flat, positive bowel sounds normal in frequency in pitch, no bruits, no rebound, no guarding, no midline pulsatile mass, no hepatomegaly, no splenomegaly EXT:  2 plus pulses throughout, no edema, no cyanosis no clubbing   EKG:  EKG is ***ordered today. ***  Recent Labs: 10/07/2016: ALT 22; BUN 27; Creatinine, Ser 1.59; Hemoglobin 13.9; Platelets 269; Potassium 5.4; Sodium 141    Lipid Panel    Component Value Date/Time   CHOL 171 10/07/2016 1620   CHOL 121 08/06/2013 1123   TRIG 347 (  H) 10/07/2016 1620   TRIG 302 (H) 02/16/2016 1247   TRIG 136 08/06/2013 1123   HDL 49 10/07/2016 1620   HDL 51 02/16/2016 1247   HDL 48 08/06/2013 1123   CHOLHDL 3.5 10/07/2016 1620   LDLCALC 53 10/07/2016 1620   LDLCALC 46 08/06/2013 1123      Wt Readings from Last 3 Encounters:  10/07/16 159 lb (72.1 kg)  08/10/16 166 lb (75.3 kg)  06/07/16 160 lb (72.6 kg)      Other studies Reviewed: Additional studies/ records that were reviewed today include: *** Review of the above records demonstrates:  ***   ASSESSMENT AND PLAN:  ATRIAL FIB:   This has been permanent.  Ms. CACHET MCCUTCHEN has a CHA2DS2 - VASc score of 3. ***   She tolerates anticoagulation. The rate is reasonably controlled given her chronic lung disease and dyspnea. No change in therapy is indicated.  DYSPNEA:  *** She is managed for chronic lung disease seems to be improved.  CORONARY CALCIUM:   *** We have managed this medically.    TOBACCO ABUSE:  ***  She admits she is still smoking and she will not quit.   Current medicines are reviewed at length with the patient today.  The patient does not have concerns regarding medicines.  The following changes have been made: ***  Labs/ tests ordered today include:  ***  No orders of the defined types were placed in this encounter.    Disposition:   FU with me in *** months.     Signed, [name], MD  01/24/2017 6:06 PM    [facility]

## 2017-01-25 ENCOUNTER — Ambulatory Visit: Payer: Medicare Other | Admitting: Cardiology

## 2017-01-25 LAB — POCT INR: INR: 2.4

## 2017-02-02 ENCOUNTER — Ambulatory Visit (INDEPENDENT_AMBULATORY_CARE_PROVIDER_SITE_OTHER): Payer: Self-pay | Admitting: Pharmacist

## 2017-02-03 DIAGNOSIS — J449 Chronic obstructive pulmonary disease, unspecified: Secondary | ICD-10-CM | POA: Diagnosis not present

## 2017-02-08 ENCOUNTER — Encounter: Payer: Self-pay | Admitting: Family Medicine

## 2017-02-08 ENCOUNTER — Ambulatory Visit (INDEPENDENT_AMBULATORY_CARE_PROVIDER_SITE_OTHER): Payer: Medicare Other | Admitting: Family Medicine

## 2017-02-08 VITALS — BP 108/66 | HR 93 | Temp 97.6°F | Ht 64.0 in | Wt 165.0 lb

## 2017-02-08 DIAGNOSIS — R0602 Shortness of breath: Secondary | ICD-10-CM

## 2017-02-08 DIAGNOSIS — I4821 Permanent atrial fibrillation: Secondary | ICD-10-CM

## 2017-02-08 DIAGNOSIS — R918 Other nonspecific abnormal finding of lung field: Secondary | ICD-10-CM

## 2017-02-08 DIAGNOSIS — C55 Malignant neoplasm of uterus, part unspecified: Secondary | ICD-10-CM | POA: Diagnosis not present

## 2017-02-08 DIAGNOSIS — G8929 Other chronic pain: Secondary | ICD-10-CM

## 2017-02-08 DIAGNOSIS — I10 Essential (primary) hypertension: Secondary | ICD-10-CM | POA: Diagnosis not present

## 2017-02-08 DIAGNOSIS — M5440 Lumbago with sciatica, unspecified side: Secondary | ICD-10-CM | POA: Diagnosis not present

## 2017-02-08 DIAGNOSIS — E7800 Pure hypercholesterolemia, unspecified: Secondary | ICD-10-CM

## 2017-02-08 DIAGNOSIS — I272 Pulmonary hypertension, unspecified: Secondary | ICD-10-CM | POA: Diagnosis not present

## 2017-02-08 DIAGNOSIS — H9193 Unspecified hearing loss, bilateral: Secondary | ICD-10-CM | POA: Diagnosis not present

## 2017-02-08 DIAGNOSIS — E559 Vitamin D deficiency, unspecified: Secondary | ICD-10-CM

## 2017-02-08 DIAGNOSIS — I714 Abdominal aortic aneurysm, without rupture, unspecified: Secondary | ICD-10-CM

## 2017-02-08 DIAGNOSIS — M7989 Other specified soft tissue disorders: Secondary | ICD-10-CM

## 2017-02-08 DIAGNOSIS — I482 Chronic atrial fibrillation: Secondary | ICD-10-CM | POA: Diagnosis not present

## 2017-02-08 NOTE — Patient Instructions (Addendum)
Medicare Annual Wellness Visit  [facility] and the medical providers at [facility] strive to bring you the best medical care.  In doing so we not only want to address your current medical conditions and concerns but also to detect new conditions early and prevent illness, disease and health-related problems.    Medicare offers a yearly Wellness Visit which allows our clinical staff to assess your need for preventative services including immunizations, lifestyle education, counseling to decrease risk of preventable diseases and screening for fall risk and other medical concerns.    This visit is provided free of charge (no copay) for all Medicare recipients. The clinical pharmacists at [facility] have begun to conduct these Wellness Visits which will also include a thorough review of all your medications.    As you primary medical provider recommend that you make an appointment for your Annual Wellness Visit if you have not done so already this year.  You may set up this appointment before you leave today or you may call back ([phone]) and schedule an appointment.  Please make sure when you call that you mention that you are scheduling your Annual Wellness Visit with the clinical pharmacist so that the appointment may be made for the proper length of time.     Continue current medications. Continue good therapeutic lifestyle changes which include good diet and exercise. Fall precautions discussed with patient. If an FOBT was given today- please return it to our front desk. If you are over 58 years old - you may need Prevnar 67 or the adult Pneumonia vaccine.  **Flu shots are available--- please call and schedule a FLU-CLINIC appointment**  After your visit with [place] today you will receive a survey in the mail or online from [employer] regarding your care with [place]. Please take a moment to fill this out. Your feedback is very  important to [place] as you can help [place] better understand your patient needs as well as improve your experience and satisfaction. WE CARE ABOUT YOU!!!   We will arrange for you to have follow-up appointments with the pulmonologist and the cardiologist The patient will follow-up with the neurosurgeon as planned She will be asked to return the FOBT She will remain on her O2 at 2 L/m She will continue to drink plenty of fluids and stay well hydrated and promises to take as little pain medicine as possible

## 2017-02-08 NOTE — Progress Notes (Signed)
Subjective:    Patient ID: Melinda Collins, female    DOB: 07-14-1941, 76 y.o.   MRN: [id number]  HPI Pt here for follow up and management of chronic medical problems which includes hyperlipidemia and hypertension. She is taking medication regularly.The patient is stable. She comes to the visit today with her daughter-in-law who keeps a close check on her. She is on continuous oxygen because of her severe COPD and emphysema. She has missed appointments with her pulmonologist and cardiologist and we will help her get these rearranged. She is also being followed by the neurosurgeon because of her chronic back pain that has loose screws in it and she is unable to have any more surgery because of her heart condition in her lung condition. She is taking Duragesic and Percocet for this. Currently the neurosurgeon is following her for her pain management. The patient says that her back pain is worse than anything she is experiencing it is worse when she's getting up and walking and better when she is sitting. She denies any more chest pain and usual other than having some pain beneath the left breast which may be more gas related than actual chest related. She has her ongoing shortness of breath which is no worse than usual. She says her swallowing and stomach issues are stable and her bowel movements are regular with no sign of any blood loss. She is passing her water without problems. A CT scan of her chest and abdomen were reviewed prior to going into the visit and she will need another CT scan of the lungs without contrast in October and will need an ultrasound of her abdomen to follow-up on the infrarenal abdominal aortic aneurysm in October 2020. She will be given an FOBT to return. She is requesting today that prescription for a wheelchair and a handicap sticker. Her O2 today on 2 L of oxygen is 98%. She's using her nebulizers regularly at home.     Patient Active Problem List   Diagnosis Date Noted   . Aortic atherosclerosis ([place]) 04/22/2016  . Permanent atrial fibrillation ([place]) 10/16/2014  . Metabolic syndrome 84/13/2440  . History of uterine cancer 12/03/2013  . Chronic low back pain 05/23/2013  . High risk medication use 05/23/2013  . Swelling of limb 05/03/2013  . Hypertension 05/17/2011  . Hyperlipidemia 02/10/2009  . TOBACCO ABUSE 02/10/2009  . 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 Atrial fibrillation ([place]) 02/10/2009   Outpatient Encounter Prescriptions as of 02/08/2017  Medication Sig  . albuterol (PROVENTIL) (2.5 MG/3ML) 0.083% nebulizer solution INHALE CONTENTS OF 1 VIAL IN NEBULIZER EVERY 6 HOURS AS NEEDED FOR FOR WHEEZING OR SHORTNESS OF BREATH.  Marland Kitchen amLODipine (NORVASC) 5 MG tablet TAKE ONE TABLET BY MOUTH ONCE DAILY  . atorvastatin (LIPITOR) 80 MG tablet TAKE ONE TABLET BY MOUTH DAILY  . azelastine (ASTELIN) 0.1 % nasal spray USE ONE SPRAY IN EACH NOSTRIL TWICE DAILY  . BREO ELLIPTA 100-25 MCG/INH AEPB Take 1 puff by mouth daily.  . Cholecalciferol (VITAMIN D-3) 1000 units CAPS Take 1 capsule by mouth daily.  Marland Kitchen diltiazem (CARDIZEM) 30 MG tablet TAKE ONE TABLET BY MOUTH TWICE DAILY  . fentaNYL (DURAGESIC - DOSED MCG/HR) 50 MCG/HR Place 50 mcg onto the skin every 3 (three) days.  . fluticasone (FLONASE) 50 MCG/ACT nasal spray USE 2 SPRAYS IN EACH NOSTRIL ONCE DAILY. SHAKE GENTLY BEFORE USING.  Marland Kitchen guaiFENesin (MUCINEX) 600 MG 12 hr tablet Take 1 tablet (600 mg total) by mouth 2 (two) times daily  as needed.  . INCRUSE ELLIPTA 62.5 MCG/INH AEPB Take 1 puff by mouth daily.  Marland Kitchen loratadine (CLARITIN) 10 MG tablet Take 1 tablet (10 mg total) by mouth daily.  . Multiple Vitamins-Minerals (CENTRUM SILVER PO) Take 1 tablet by mouth daily.    . NON FORMULARY Oxygen 3L/min   . oxyCODONE-acetaminophen (PERCOCET) 5-325 MG per tablet Take 2 tablets by mouth every 8 (eight) hours as needed.    Marland Kitchen PARoxetine (PAXIL) 40 MG tablet TAKE 1 AND 1/2 TABLETS BY MOUTH DAILY.  Marland Kitchen potassium chloride  (K-DUR,KLOR-CON) 10 MEQ tablet TAKE ONE TABLET BY MOUTH DAILY.  Marland Kitchen PROAIR HFA 108 (90 Base) MCG/ACT inhaler INHALE 1-2 PUFFS EVERY 4 TO 6 HOURS AS NEEDED FOR SHORTNESS OF BREATH.  . torsemide (DEMADEX) 20 MG tablet TAKE 1 AND 1/2 TABLETS BY MOUTH ONCE DAILY.  Marland Kitchen warfarin (COUMADIN) 5 MG tablet TAKE 1 TO 2 TABLETS BY MOUTH AS DIRECTED BY ANTICOAGULATION CLINIC.  Marland Kitchen cyclobenzaprine (FLEXERIL) 10 MG tablet Take 10 mg by mouth 3 (three) times daily.  . [DISCONTINUED] predniSONE (DELTASONE) 10 MG tablet Take 1 tab QID x 2 days, then 1 tab TID x 2 days, then 1 tab BID x 2 days, then 1 tab QD x 2 days.   No facility-administered encounter medications on file as of 02/08/2017.      Review of Systems  Constitutional: Negative.   HENT: Negative.   Eyes: Negative.   Respiratory: Positive for shortness of breath.   Cardiovascular: Negative.   Gastrointestinal: Negative.   Endocrine: Negative.   Genitourinary: Negative.   Musculoskeletal: Positive for back pain (DR Carloyn Manner).  Skin: Negative.   Allergic/Immunologic: Negative.   Neurological: Negative.   Hematological: Negative.   Psychiatric/Behavioral: Negative.        Objective:   Physical Exam  Constitutional: She is oriented to person, place, and time. She appears well-developed and well-nourished. No distress.  Patient is talkative as usual with decreased hearing. She is on O2 continuously.  HENT:  Head: Normocephalic and atraumatic.  Right Ear: External ear normal.  Left Ear: External ear normal.  Nose: Nose normal.  Mouth/Throat: Oropharynx is clear and moist.  Diminished hearing bilaterally  Eyes: Pupils are equal, round, and reactive to light. Conjunctivae and EOM are normal. Right eye exhibits no discharge. Left eye exhibits no discharge. No scleral icterus.  Neck: Normal range of motion. Neck supple. No thyromegaly present.  No bruits or thyromegaly  Cardiovascular: Normal rate, regular rhythm, normal heart sounds and intact distal  pulses.   No murmur heard. The heart is irregular irregular at 84/m  Pulmonary/Chest: Effort normal. No respiratory distress. She has wheezes. She has no rales. She exhibits no tenderness.  Diminished breath sounds bilaterally with expiratory wheezes and with rhonchi with coughing. No rales.  Abdominal: Soft. Bowel sounds are normal. She exhibits no mass. There is tenderness. There is no rebound and no guarding.  No liver or spleen enlargement. Slight left upper quadrant tenderness.  Musculoskeletal: Normal range of motion. She exhibits no edema.  Range of motion is limited due to her lung condition.  Lymphadenopathy:    She has no cervical adenopathy.  Neurological: She is alert and oriented to person, place, and time.  Skin: Skin is warm and dry. No rash noted.  Psychiatric: She has a normal mood and affect. Her behavior is normal. Judgment and thought content normal.  Nursing note and vitals reviewed.  BP 108/66 (BP Location: Left Arm)   Pulse 93   Temp 97.6  F (36.4 C) (Oral)   Ht '5\' 4"'  (1.626 m)   Wt 165 lb (74.8 kg)   SpO2 98% Comment: on 2 liters  BMI 28.32 kg/m         Assessment & Plan:  1. Pure hypercholesterolemia -Continue with aggressive therapeutic lifestyle changes - CBC with Differential/Platelet - Lipid panel - DME Wheelchair manual - Home Health - Face-to-face encounter (required for Medicare/Medicaid patients)  2. Essential hypertension -The blood pressure is good today and she will continue with current treatment - BMP8+EGFR - CBC with Differential/Platelet - Hepatic function panel - DME Wheelchair manual - Home Health - Face-to-face encounter (required for Medicare/Medicaid patients)  3. Vitamin D deficiency -Continue with current treatment pending results of lab work - CBC with Differential/Platelet - VITAMIN D 25 Hydroxy (Vit-D Deficiency, Fractures)  4. Permanent atrial fibrillation ([facility]) -Continue with Coumadin and antiarrhythmics - CBC  with Differential/Platelet - DME Wheelchair manual - Home Health - Face-to-face encounter (required for Medicare/Medicaid patients)  5. Swelling of limb, left -This is stable with no major issues today. - DME Wheelchair manual - Home Health - Face-to-face encounter (required for Medicare/Medicaid patients)  6. Chronic pulmonary hypertension ([place]) -Follow-up with pulmonology as planned - DME Wheelchair manual - Home Health - Face-to-face encounter (required for Medicare/Medicaid patients)  7. SOB (shortness of breath) -Continue inhalers and follow-up with pulmonology - DME Wheelchair manual - Home Health - Face-to-face encounter (required for Medicare/Medicaid patients)  8. Pulmonary nodules -Repeat CT scan this October without contrast due to pulmonary nodules  9. Abdominal aortic aneurysm (AAA) without rupture ([place]) -Repeat ultrasound in October 2020 because of infrarenal aortic aneurysm  10. 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  11. Chronic low back pain because of multiple surgeries and no further surgery can be done due to her comorbidities. She is currently being followed for this problem with pain management by the neurosurgeon.  12. Malignant neoplasm of uterus  Patient Instructions                       Medicare Annual Wellness Visit  [facility] and the medical providers at [facility] strive to bring you the best medical care.  In doing so we not only want to address your current medical conditions and concerns but also to detect new conditions early and prevent illness, disease and health-related problems.    Medicare offers a yearly Wellness Visit which allows our clinical staff to assess your need for preventative services including immunizations, lifestyle education, counseling to decrease risk of preventable diseases and screening for fall risk and other  medical concerns.    This visit is provided free of charge (no copay) for all Medicare recipients. The clinical pharmacists at [facility] have begun to conduct these Wellness Visits which will also include a thorough review of all your medications.    As you primary medical provider recommend that you make an appointment for your Annual Wellness Visit if you have not done so already this year.  You may set up this appointment before you leave today or you may call back ([phone]) and schedule an appointment.  Please make sure when you call that you mention that you are scheduling your Annual Wellness Visit with the clinical pharmacist so that the appointment may be made for the proper length of time.     Continue current medications. Continue good therapeutic  lifestyle changes which include good diet and exercise. Fall precautions discussed with patient. If an FOBT was given today- please return it to our front desk. If you are over 14 years old - you may need Prevnar 1 or the adult Pneumonia vaccine.  **Flu shots are available--- please call and schedule a FLU-CLINIC appointment**  After your visit with [place] today you will receive a survey in the mail or online from [employer] regarding your care with [place]. Please take a moment to fill this out. Your feedback is very important to [place] as you can help [place] better understand your patient needs as well as improve your experience and satisfaction. WE CARE ABOUT YOU!!!   We will arrange for you to have follow-up appointments with the pulmonologist and the cardiologist The patient will follow-up with the neurosurgeon as planned She will be asked to return the FOBT She will remain on her O2 at 2 L/m She will continue to drink plenty of fluids and stay well hydrated and promises to take as little pain medicine as possible   Arrie Senate MD

## 2017-02-09 LAB — LIPID PANEL
CHOL/HDL RATIO: 3.1 ratio (ref 0.0–4.4)
Cholesterol, Total: 156 mg/dL (ref 100–199)
HDL: 50 mg/dL (ref >39)
LDL Calculated: 62 mg/dL (ref 0–99)
TRIGLYCERIDES: 219 mg/dL — AB (ref 0–149)
VLDL CHOLESTEROL CAL: 44 mg/dL — AB (ref 5–40)

## 2017-02-09 LAB — HEPATIC FUNCTION PANEL
ALT: 12 IU/L (ref 0–32)
AST: 18 IU/L (ref 0–40)
Albumin: 3.8 g/dL (ref 3.5–4.8)
Alkaline Phosphatase: 93 IU/L (ref 39–117)
BILIRUBIN TOTAL: 0.3 mg/dL (ref 0.0–1.2)
BILIRUBIN, DIRECT: 0.1 mg/dL (ref 0.00–0.40)
Total Protein: 6.2 g/dL (ref 6.0–8.5)

## 2017-02-09 LAB — CBC WITH DIFFERENTIAL/PLATELET
BASOS: 0 %
Basophils Absolute: 0 10*3/uL (ref 0.0–0.2)
EOS (ABSOLUTE): 0.2 10*3/uL (ref 0.0–0.4)
EOS: 3 %
HEMATOCRIT: 38.5 % (ref 34.0–46.6)
Hemoglobin: 12.2 g/dL (ref 11.1–15.9)
Immature Grans (Abs): 0 10*3/uL (ref 0.0–0.1)
Immature Granulocytes: 0 %
LYMPHS ABS: 1.7 10*3/uL (ref 0.7–3.1)
Lymphs: 21 %
MCH: 28.9 pg (ref 26.6–33.0)
MCHC: 31.7 g/dL (ref 31.5–35.7)
MCV: 91 fL (ref 79–97)
MONOCYTES: 9 %
MONOS ABS: 0.7 10*3/uL (ref 0.1–0.9)
NEUTROS ABS: 5.3 10*3/uL (ref 1.4–7.0)
Neutrophils: 67 %
Platelets: 219 10*3/uL (ref 150–379)
RBC: 4.22 x10E6/uL (ref 3.77–5.28)
RDW: 15.7 % — ABNORMAL HIGH (ref 12.3–15.4)
WBC: 8 10*3/uL (ref 3.4–10.8)

## 2017-02-09 LAB — BMP8+EGFR
BUN/Creatinine Ratio: 16 (ref 12–28)
BUN: 22 mg/dL (ref 8–27)
CO2: 26 mmol/L (ref 20–29)
Calcium: 9.2 mg/dL (ref 8.7–10.3)
Chloride: 104 mmol/L (ref 96–106)
Creatinine, Ser: 1.34 mg/dL — ABNORMAL HIGH (ref 0.57–1.00)
GFR, EST AFRICAN AMERICAN: 45 mL/min/{1.73_m2} — AB (ref >59)
GFR, EST NON AFRICAN AMERICAN: 39 mL/min/{1.73_m2} — AB (ref >59)
Glucose: 93 mg/dL (ref 65–99)
Potassium: 4.7 mmol/L (ref 3.5–5.2)
SODIUM: 145 mmol/L — AB (ref 134–144)

## 2017-02-09 LAB — VITAMIN D 25 HYDROXY (VIT D DEFICIENCY, FRACTURES): VIT D 25 HYDROXY: 30.5 ng/mL (ref 30.0–100.0)

## 2017-02-10 ENCOUNTER — Ambulatory Visit: Payer: Medicare Other | Admitting: Family Medicine

## 2017-02-16 ENCOUNTER — Ambulatory Visit (INDEPENDENT_AMBULATORY_CARE_PROVIDER_SITE_OTHER): Payer: Medicare Other | Admitting: Pharmacist

## 2017-02-16 DIAGNOSIS — I482 Chronic atrial fibrillation: Secondary | ICD-10-CM | POA: Diagnosis not present

## 2017-02-16 DIAGNOSIS — I4821 Permanent atrial fibrillation: Secondary | ICD-10-CM

## 2017-02-16 LAB — POCT INR: INR: 2.2

## 2017-02-24 ENCOUNTER — Ambulatory Visit (INDEPENDENT_AMBULATORY_CARE_PROVIDER_SITE_OTHER): Payer: Self-pay | Admitting: Pharmacist

## 2017-02-24 DIAGNOSIS — Z7901 Long term (current) use of anticoagulants: Secondary | ICD-10-CM | POA: Diagnosis not present

## 2017-02-24 DIAGNOSIS — I482 Chronic atrial fibrillation: Secondary | ICD-10-CM | POA: Diagnosis not present

## 2017-02-24 LAB — POCT INR: INR: 2

## 2017-02-24 NOTE — Progress Notes (Signed)
Patient ID: Melinda Collins, female   DOB: October 30, 1940, 76 y.o.   MRN: [id number]   No charge for labs or visit - INR check through home monitoring system

## 2017-02-27 DIAGNOSIS — M4716 Other spondylosis with myelopathy, lumbar region: Secondary | ICD-10-CM | POA: Diagnosis not present

## 2017-02-27 DIAGNOSIS — I808 Phlebitis and thrombophlebitis of other sites: Secondary | ICD-10-CM | POA: Diagnosis not present

## 2017-03-03 LAB — POCT INR: INR: 2.4

## 2017-03-06 DIAGNOSIS — J449 Chronic obstructive pulmonary disease, unspecified: Secondary | ICD-10-CM | POA: Diagnosis not present

## 2017-03-07 ENCOUNTER — Ambulatory Visit: Payer: Self-pay | Admitting: *Deleted

## 2017-03-07 NOTE — Progress Notes (Signed)
Subjective:     Indication: atrial fibrillation Bleeding signs/symptoms: None Thromboembolic signs/symptoms: None  Missed Coumadin doses: None Medication changes: no Dietary changes: no Bacterial/viral infection: no Other concerns: no    Objective:    INR Today: 2.4 reported by fax from [facility] self testing service Current dose:    Anticoagulation Warfarin Dose Instructions as of 03/07/2017      Dorene Grebe Tue Wed Thu Fri Sat   New Dose 5 mg 7.5 mg 5 mg 5 mg 5 mg 7.5 mg 5 mg    Description   Continue current warfarin 5mg  dose -  take 1 and 1/2  tablets mondays and fridays.  Take 1 tablet all other days.       Assessment:    Therapeutic INR for goal of 2-3   Plan:    1. New dose: no change   2. Next INR: 1 week    Agreed Mary-Margaret Hassell Done, FNP

## 2017-03-10 ENCOUNTER — Ambulatory Visit: Payer: Medicare Other | Admitting: Family Medicine

## 2017-03-10 LAB — POCT INR: INR: 2.4 — AB (ref <=1.1)

## 2017-03-15 ENCOUNTER — Ambulatory Visit (INDEPENDENT_AMBULATORY_CARE_PROVIDER_SITE_OTHER): Payer: Medicare Other | Admitting: *Deleted

## 2017-03-15 NOTE — Progress Notes (Signed)
Subjective:    Called patient to discuss but there was no answer. Message left on voicemail to return my call so that I could review her recent history.     Objective:    INR Today: 2.4 Current dose:  Anticoagulation Warfarin Dose Instructions as of 03/15/2017      Dorene Grebe Tue Wed Thu Fri Sat   New Dose 5 mg 7.5 mg 5 mg 5 mg 5 mg 7.5 mg 5 mg    Description   Continue current warfarin 5mg  dose -  take 1 and 1/2  tablets mondays and fridays.  Take 1 tablet all other days.        Assessment:    Therapeutic INR for goal of 2-3   Plan:    1. New dose: no change   2. Next INR: 1 week   Left on voicemail  Recommendations by Redge Gainer, MD  Chong Sicilian, RN

## 2017-03-28 ENCOUNTER — Other Ambulatory Visit: Payer: Self-pay | Admitting: Family Medicine

## 2017-03-29 ENCOUNTER — Other Ambulatory Visit: Payer: Self-pay | Admitting: *Deleted

## 2017-03-29 DIAGNOSIS — I714 Abdominal aortic aneurysm, without rupture, unspecified: Secondary | ICD-10-CM

## 2017-03-29 DIAGNOSIS — N281 Cyst of kidney, acquired: Secondary | ICD-10-CM

## 2017-03-29 DIAGNOSIS — D3500 Benign neoplasm of unspecified adrenal gland: Secondary | ICD-10-CM

## 2017-03-29 NOTE — Telephone Encounter (Signed)
Last seen 10/07/16  DWM

## 2017-03-31 DIAGNOSIS — Z7901 Long term (current) use of anticoagulants: Secondary | ICD-10-CM | POA: Diagnosis not present

## 2017-03-31 DIAGNOSIS — I482 Chronic atrial fibrillation: Secondary | ICD-10-CM | POA: Diagnosis not present

## 2017-04-06 ENCOUNTER — Ambulatory Visit (HOSPITAL_COMMUNITY): Admission: RE | Admit: 2017-04-06 | Payer: Medicare Other | Source: Ambulatory Visit

## 2017-04-06 DIAGNOSIS — J449 Chronic obstructive pulmonary disease, unspecified: Secondary | ICD-10-CM | POA: Diagnosis not present

## 2017-04-07 ENCOUNTER — Other Ambulatory Visit: Payer: Self-pay | Admitting: Cardiology

## 2017-04-17 ENCOUNTER — Ambulatory Visit (INDEPENDENT_AMBULATORY_CARE_PROVIDER_SITE_OTHER): Payer: Medicare Other | Admitting: *Deleted

## 2017-04-17 LAB — POCT INR: INR: 2.9 — AB (ref <=1.1)

## 2017-04-17 NOTE — Progress Notes (Signed)
[facility] fax results Test date 04/17/17 2:51 pm INR 2.9

## 2017-04-17 NOTE — Progress Notes (Signed)
Left message on home phone that INR was within normal range and to continue her current dose of coumadin.     Anticoagulation Warfarin Dose Instructions as of 04/17/2017      Melinda Collins Tue Wed Thu Fri Sat   New Dose 5 mg 7.5 mg 5 mg 5 mg 5 mg 7.5 mg 5 mg    Description   Continue current warfarin 5mg  dose -  take 1 and 1/2  tablets mondays and fridays.  Take 1 tablet all other days.     Asked her to return my call if she has had any complications such as bruising, bleeding, or signs of a blood clot or if she has had any medication or diet changes or upcoming procedures.   She should repeat this in 1 week.   Chong Sicilian, RN

## 2017-04-17 NOTE — Addendum Note (Signed)
Addended by: Ilean China on: 04/17/2017 05:22 PM   Modules accepted: Level of Service

## 2017-04-18 ENCOUNTER — Other Ambulatory Visit: Payer: Self-pay | Admitting: Family Medicine

## 2017-04-28 ENCOUNTER — Ambulatory Visit (INDEPENDENT_AMBULATORY_CARE_PROVIDER_SITE_OTHER): Payer: Medicare Other | Admitting: *Deleted

## 2017-04-28 LAB — POCT INR: INR: 2 — AB (ref <=1.1)

## 2017-04-28 NOTE — Progress Notes (Signed)
[facility]INR PT/INR Self Testing Service Test Date/Time 04/28/17 3:39pm INR 2.0

## 2017-04-29 ENCOUNTER — Other Ambulatory Visit: Payer: Self-pay | Admitting: Family Medicine

## 2017-05-02 NOTE — Progress Notes (Signed)
Continue current dose and rck in 1 week as planned

## 2017-05-02 NOTE — Addendum Note (Signed)
Addended by: Ilean China on: 05/02/2017 03:49 PM   Modules accepted: Level of Service

## 2017-05-05 DIAGNOSIS — I482 Chronic atrial fibrillation: Secondary | ICD-10-CM | POA: Diagnosis not present

## 2017-05-05 DIAGNOSIS — Z7901 Long term (current) use of anticoagulants: Secondary | ICD-10-CM | POA: Diagnosis not present

## 2017-05-05 LAB — POCT INR: INR: 2.3 — AB (ref <=1.1)

## 2017-05-06 DIAGNOSIS — J449 Chronic obstructive pulmonary disease, unspecified: Secondary | ICD-10-CM | POA: Diagnosis not present

## 2017-05-08 ENCOUNTER — Ambulatory Visit (INDEPENDENT_AMBULATORY_CARE_PROVIDER_SITE_OTHER): Payer: Medicare Other | Admitting: *Deleted

## 2017-05-08 NOTE — Progress Notes (Signed)
Called patient on 05/05/17  Subjective:     Indication: atrial fibrillation Bleeding signs/symptoms: None Thromboembolic signs/symptoms: None  Missed Coumadin doses: None Medication changes: no Dietary changes: no Bacterial/viral infection: no Other concerns: no  The following portions of the patient's history were reviewed and updated as appropriate: allergies and current medications.  Review of Systems Pertinent items are noted in HPI.   Objective:    INR Today: 2.3 Current dose: 5mg  daily except for 7.5mg  on Mon and Fri    Assessment:    Therapeutic INR for goal of 2-3   Plan:    1. New dose: no change   2. Next INR: 1 week    Chong Sicilian, RN

## 2017-05-12 ENCOUNTER — Other Ambulatory Visit: Payer: Self-pay | Admitting: Family Medicine

## 2017-05-16 ENCOUNTER — Ambulatory Visit (INDEPENDENT_AMBULATORY_CARE_PROVIDER_SITE_OTHER): Payer: Medicare Other | Admitting: *Deleted

## 2017-05-16 LAB — POCT INR: INR: 2.6 — AB (ref <=1.1)

## 2017-05-16 NOTE — Progress Notes (Signed)
Left message on home voicemail that INR is normal and to continue current dose. Call if she has had any problems or if she has had any medication changes.   Mary-Margaret Hassell Done, FNP

## 2017-05-16 NOTE — Progress Notes (Signed)
[facility]INR PT/INR self testing service Test date/time 04/19/17 2:55 INR 2.6

## 2017-05-22 DIAGNOSIS — J449 Chronic obstructive pulmonary disease, unspecified: Secondary | ICD-10-CM | POA: Diagnosis not present

## 2017-05-22 DIAGNOSIS — I4891 Unspecified atrial fibrillation: Secondary | ICD-10-CM | POA: Diagnosis not present

## 2017-05-22 DIAGNOSIS — M545 Low back pain: Secondary | ICD-10-CM | POA: Diagnosis not present

## 2017-05-22 DIAGNOSIS — M7989 Other specified soft tissue disorders: Secondary | ICD-10-CM | POA: Diagnosis not present

## 2017-05-22 DIAGNOSIS — Q7649 Other congenital malformations of spine, not associated with scoliosis: Secondary | ICD-10-CM | POA: Diagnosis not present

## 2017-05-23 ENCOUNTER — Ambulatory Visit (INDEPENDENT_AMBULATORY_CARE_PROVIDER_SITE_OTHER): Payer: Medicare Other | Admitting: *Deleted

## 2017-05-23 LAB — POCT INR: INR: 2.4 — AB (ref <=1.1)

## 2017-05-23 NOTE — Progress Notes (Signed)
Subjective:     Indication: atrial fibrillation Bleeding signs/symptoms: None Thromboembolic signs/symptoms: None  Missed Coumadin doses: None Medication changes: no Dietary changes: no Bacterial/viral infection: no Other concerns: no  The following portions of the patient's history were reviewed and updated as appropriate: allergies and current medications.  Review of Systems Pertinent items are noted in HPI.   Objective:    INR Today: 2.4 Current dose:  7.5mg  on Mon and Fri and 5mg  all other days  Assessment:    Therapeutic INR for goal of 2-3   Plan:    1. New dose: no change   2. Next INR: 1 week    Chong Sicilian, RN

## 2017-05-23 NOTE — Progress Notes (Signed)
[facility]INR PT/INR self testing service Test date/time 05/23/17 2:38 INR 2.4

## 2017-06-03 ENCOUNTER — Other Ambulatory Visit: Payer: Self-pay | Admitting: Family Medicine

## 2017-06-05 ENCOUNTER — Ambulatory Visit (INDEPENDENT_AMBULATORY_CARE_PROVIDER_SITE_OTHER): Payer: Medicare Other | Admitting: *Deleted

## 2017-06-05 LAB — POCT INR: INR: 2.6 — AB (ref 0.9–1.1)

## 2017-06-05 NOTE — Progress Notes (Signed)
Subjective:     Indication: atrial fibrillation Bleeding signs/symptoms: None Thromboembolic signs/symptoms: None  Missed Coumadin doses: None Medication changes: no Dietary changes: no Bacterial/viral infection: no Other concerns: no  The following portions of the patient's history were reviewed and updated as appropriate: allergies and current medications.  Review of Systems Pertinent items are noted in HPI.   Objective:    INR Today: 2.6 Current dose:  7.5mg  on Mon and Fri and 5mg  all other days  Assessment:    Therapeutic INR for goal of 2-3   Plan:    1. New dose: no change   2. Next INR: 1 week    Chong Sicilian, RN

## 2017-06-06 DIAGNOSIS — M47816 Spondylosis without myelopathy or radiculopathy, lumbar region: Secondary | ICD-10-CM | POA: Diagnosis not present

## 2017-06-06 DIAGNOSIS — J449 Chronic obstructive pulmonary disease, unspecified: Secondary | ICD-10-CM | POA: Diagnosis not present

## 2017-06-06 NOTE — Progress Notes (Signed)
Extended the expected date - sent to [facility] for scheduling.

## 2017-06-13 ENCOUNTER — Ambulatory Visit: Payer: Self-pay | Admitting: *Deleted

## 2017-06-13 DIAGNOSIS — I482 Chronic atrial fibrillation: Secondary | ICD-10-CM | POA: Diagnosis not present

## 2017-06-13 DIAGNOSIS — Z7901 Long term (current) use of anticoagulants: Secondary | ICD-10-CM | POA: Diagnosis not present

## 2017-06-13 LAB — POCT INR: INR: 2.7 — AB (ref 0.9–1.1)

## 2017-06-13 NOTE — Progress Notes (Signed)
Fax received [facility]INR PT/INR self testing service Test date/time 06/13/17 3:28 pm INR 2.7

## 2017-06-14 NOTE — Progress Notes (Signed)
Continue current dose and rck in 1 week.

## 2017-06-15 ENCOUNTER — Ambulatory Visit (INDEPENDENT_AMBULATORY_CARE_PROVIDER_SITE_OTHER): Payer: Medicare Other | Admitting: Family Medicine

## 2017-06-15 ENCOUNTER — Encounter: Payer: Self-pay | Admitting: Family Medicine

## 2017-06-15 VITALS — BP 119/82 | HR 103 | Temp 96.8°F | Ht 64.0 in | Wt 163.0 lb

## 2017-06-15 DIAGNOSIS — R0602 Shortness of breath: Secondary | ICD-10-CM

## 2017-06-15 DIAGNOSIS — I4821 Permanent atrial fibrillation: Secondary | ICD-10-CM

## 2017-06-15 DIAGNOSIS — I482 Chronic atrial fibrillation: Secondary | ICD-10-CM | POA: Diagnosis not present

## 2017-06-15 DIAGNOSIS — R918 Other nonspecific abnormal finding of lung field: Secondary | ICD-10-CM

## 2017-06-15 DIAGNOSIS — I700 Atherosclerosis of aorta: Secondary | ICD-10-CM | POA: Diagnosis not present

## 2017-06-15 DIAGNOSIS — E7800 Pure hypercholesterolemia, unspecified: Secondary | ICD-10-CM | POA: Diagnosis not present

## 2017-06-15 DIAGNOSIS — G8929 Other chronic pain: Secondary | ICD-10-CM

## 2017-06-15 DIAGNOSIS — E559 Vitamin D deficiency, unspecified: Secondary | ICD-10-CM

## 2017-06-15 DIAGNOSIS — I10 Essential (primary) hypertension: Secondary | ICD-10-CM

## 2017-06-15 DIAGNOSIS — I272 Pulmonary hypertension, unspecified: Secondary | ICD-10-CM

## 2017-06-15 DIAGNOSIS — I714 Abdominal aortic aneurysm, without rupture, unspecified: Secondary | ICD-10-CM

## 2017-06-15 DIAGNOSIS — M5440 Lumbago with sciatica, unspecified side: Secondary | ICD-10-CM

## 2017-06-15 NOTE — Progress Notes (Signed)
Subjective:    Patient ID: Melinda Collins, female    DOB: 23-Feb-1941, 76 y.o.   MRN: [id number]  HPI Pt here for follow up and management of chronic medical problems which includes a fib, hypertension and hyperlipidemia. She is taking medication regularly.  Patient has no specific complaints today.  She is past due on getting a CT scan in October.  She continues to have the ongoing back pain.  Her weight is down 2 pounds.  The patient recently saw Dr. Carloyn Manner her neurosurgeon and he restated again the her bones are soft and that she would not tolerate any additional surgery on her back.  He emphasized that she was going to have to learn to live with the pain and try to take as little pain medicine as possible.  The patient is alert today and feisty and her daughter-in-law is with her.  She denies any chest pain and no more shortness of breath than usual.  She is actually been not using her oxygen around the house on said.  She denies any trouble with nausea vomiting diarrhea or blood in the stool.  She is passing her water without problems.  She did have a CT scan about a year ago and it was indicated on that that she has an infrarenal abdominal aortic aneurysm that needs a repeat ultrasound in 3 years but they did say that with the lung she needed a repeat CT scan in 1 year if she is a person that is at high risk and we will go ahead and order this.    Patient Active Problem List   Diagnosis Date Noted  . Pulmonary nodules 02/08/2017  . Aortic atherosclerosis ([place]) 04/22/2016  . Permanent atrial fibrillation ([place]) 10/16/2014  . Metabolic syndrome 17/79/3903  . History of uterine cancer 12/03/2013  . Chronic low back pain 05/23/2013  . High risk medication use 05/23/2013  . Swelling of limb 05/03/2013  . Hypertension 05/17/2011  . Hyperlipidemia 02/10/2009  . TOBACCO ABUSE 02/10/2009  . 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 Atrial fibrillation ([place]) 02/10/2009   Outpatient Encounter  Medications as of 06/15/2017  Medication Sig  . albuterol (PROVENTIL) (2.5 MG/3ML) 0.083% nebulizer solution INHALE CONTENTS OF 1 VIAL IN NEBULIZER EVERY 6 HOURS AS NEEDED FOR FOR WHEEZING OR SHORTNESS OF BREATH.  Marland Kitchen amLODipine (NORVASC) 5 MG tablet TAKE ONE TABLET BY MOUTH ONCE DAILY  . atorvastatin (LIPITOR) 80 MG tablet TAKE ONE TABLET BY MOUTH DAILY  . azelastine (ASTELIN) 0.1 % nasal spray USE ONE SPRAY IN EACH NOSTRIL TWICE DAILY  . BREO ELLIPTA 100-25 MCG/INH AEPB Take 1 puff by mouth daily.  . Cholecalciferol (VITAMIN D-3) 1000 units CAPS Take 1 capsule by mouth daily.  . cyclobenzaprine (FLEXERIL) 10 MG tablet Take 10 mg by mouth 3 (three) times daily.  Marland Kitchen diltiazem (CARDIZEM) 30 MG tablet TAKE ONE TABLET BY MOUTH TWICE DAILY  . fentaNYL (DURAGESIC - DOSED MCG/HR) 50 MCG/HR Place 50 mcg onto the skin every 3 (three) days.  . fluticasone (FLONASE) 50 MCG/ACT nasal spray USE 2 SPRAYS IN EACH NOSTRIL ONCE DAILY. SHAKE GENTLY BEFORE USING.  Marland Kitchen guaiFENesin (MUCINEX) 600 MG 12 hr tablet Take 1 tablet (600 mg total) by mouth 2 (two) times daily as needed.  . INCRUSE ELLIPTA 62.5 MCG/INH AEPB Take 1 puff by mouth daily.  Marland Kitchen loratadine (CLARITIN) 10 MG tablet Take 1 tablet (10 mg total) by mouth daily.  . Multiple Vitamins-Minerals (CENTRUM SILVER PO) Take 1 tablet by mouth daily.    Marland Kitchen  NON FORMULARY Oxygen 3L/min   . oxyCODONE-acetaminophen (PERCOCET) 5-325 MG per tablet Take 2 tablets by mouth every 8 (eight) hours as needed.    Marland Kitchen PARoxetine (PAXIL) 40 MG tablet TAKE 1 AND 1/2 TABLETS BY MOUTH DAILY.  Marland Kitchen potassium chloride (K-DUR,KLOR-CON) 10 MEQ tablet TAKE ONE TABLET BY MOUTH DAILY.  Marland Kitchen PROAIR HFA 108 (90 Base) MCG/ACT inhaler INHALE 1-2 PUFFS EVERY 4 TO 6 HOURS AS NEEDED FOR SHORTNESS OF BREATH.  . torsemide (DEMADEX) 20 MG tablet TAKE 1 AND 1/2 TABLETS BY MOUTH ONCE DAILY.  Marland Kitchen warfarin (COUMADIN) 5 MG tablet TAKE 1 TO 2 TABLETS BY MOUTH AS DIRECTED BY ANTICOAGULATION CLINIC.   No  facility-administered encounter medications on file as of 06/15/2017.       Review of Systems  Constitutional: Negative.   HENT: Positive for mouth sores (several teeth broken off).   Eyes: Negative.   Respiratory: Negative.   Cardiovascular: Negative.   Gastrointestinal: Negative.   Endocrine: Negative.   Genitourinary: Negative.   Musculoskeletal: Positive for back pain.  Skin: Negative.   Allergic/Immunologic: Negative.   Neurological: Negative.   Hematological: Negative.   Psychiatric/Behavioral: Negative.        Objective:   Physical Exam  Constitutional: She is oriented to person, place, and time. She appears well-developed and well-nourished. No distress.  The patient is alert and doing fairly well  HENT:  Head: Normocephalic and atraumatic.  Right Ear: External ear normal.  Left Ear: External ear normal.  Nose: Nose normal.  Poor oral hygiene with many teeth that have been broken off at the gumline.  Eyes: Conjunctivae and EOM are normal. Pupils are equal, round, and reactive to light. Right eye exhibits no discharge. Left eye exhibits no discharge. No scleral icterus.  Neck: Normal range of motion. Neck supple. No thyromegaly present.  No bruits thyromegaly or anterior cervical adenopathy  Cardiovascular: Normal rate, normal heart sounds and intact distal pulses.  No murmur heard. Heart is irregular irregular at 72/min  Pulmonary/Chest: Effort normal and breath sounds normal. No respiratory distress. She has no wheezes. She has no rales.  Diminished breath sounds bilaterally congestion with coughing.  No active wheezing.  Abdominal: Soft. Bowel sounds are normal. She exhibits no mass. There is no tenderness. There is no rebound and no guarding.  Musculoskeletal: Normal range of motion. She exhibits no edema.  Patient has trouble sitting and laying and coming back to an upright position.  This is because of her ongoing chronic back pain.  Lymphadenopathy:    She  has no cervical adenopathy.  Neurological: She is alert and oriented to person, place, and time. She has normal reflexes. No cranial nerve deficit.  Skin: Skin is warm and dry. No rash noted.  Psychiatric: She has a normal mood and affect. Her behavior is normal. Judgment and thought content normal.  Nursing note and vitals reviewed.  BP 119/82 (BP Location: Right Wrist)   Pulse (!) 103   Temp (!) 96.8 F (36 C) (Oral)   Ht _0  (1.626 m)   Wt 163 lb (73.9 kg)   BMI 27.98 kg/m         Assessment & Plan:  1. Pure hypercholesterolemia -Continue with atorvastatin and aggressive therapeutic lifestyle changes pending results of lab work - CBC with Differential/Platelet - Lipid panel  2. Essential hypertension -The blood pressure is good today and she will continue with current treatment - CBC with Differential/Platelet - BMP8+EGFR - Hepatic function panel  3. Vitamin D  deficiency -Continue with current treatment pending results of lab work - CBC with Differential/Platelet - VITAMIN D 25 Hydroxy (Vit-D Deficiency, Fractures)  4. Permanent atrial fibrillation ([place]) -Follow-up with cardiology as planned - CBC with Differential/Platelet  5. SOB (shortness of breath) -Continue with O2 as needed nebulizer and inhaler treatment and Mucinex regularly and follow-up with pulmonologist as planned - CBC with Differential/Platelet  6. Aortic atherosclerosis ([place]) -Continue with his aggressive therapeutic lifestyle changes as possible - CBC with Differential/Platelet  7. Pulmonary nodules -Get CT scan as recommended by radiology from over a year ago.  8. Chronic pulmonary hypertension ([place]) -Follow-up with cardiology as planned  9. Abdominal aortic aneurysm (AAA) without rupture ([place]) -Repeat ultrasound in 2 years  10. Chronic midline low back pain with sciatica, sciatica laterality unspecified -Follow-up with neurosurgery as planned but  Patient Instructions                        Medicare Annual Wellness Visit  [facility] and the medical providers at [facility] strive to bring you the best medical care.  In doing so we not only want to address your current medical conditions and concerns but also to detect new conditions early and prevent illness, disease and health-related problems.    Medicare offers a yearly Wellness Visit which allows our clinical staff to assess your need for preventative services including immunizations, lifestyle education, counseling to decrease risk of preventable diseases and screening for fall risk and other medical concerns.    This visit is provided free of charge (no copay) for all Medicare recipients. The clinical pharmacists at [facility] have begun to conduct these Wellness Visits which will also include a thorough review of all your medications.    As you primary medical provider recommend that you make an appointment for your Annual Wellness Visit if you have not done so already this year.  You may set up this appointment before you leave today or you may call back ([phone]) and schedule an appointment.  Please make sure when you call that you mention that you are scheduling your Annual Wellness Visit with the clinical pharmacist so that the appointment may be made for the proper length of time.    Continue current medications. Continue good therapeutic lifestyle changes which include good diet and exercise. Fall precautions discussed with patient. If an FOBT was given today- please return it to our front desk. If you are over 12 years old - you may need Prevnar 39 or the adult Pneumonia vaccine.  **Flu shots are available--- please call and schedule a FLU-CLINIC appointment**  After your visit with [place] today you will receive a survey in the mail or online from [employer] regarding your care with [place]. Please take a moment to fill this out. Your feedback is very important to [place] as you  can help [place] better understand your patient needs as well as improve your experience and satisfaction. WE CARE ABOUT YOU!!!   The patient will arrange follow-up appointment with Dr. Luan Pulling We will arrange follow-up appointment with the cardiologist, Dr. Percival Spanish She should be using her nebulizer treatments regularly using her oxygen as directed and taking her Mucinex regularly twice a day with a large glass of water to keep congestion loose She should follow-up with the oral surgeon for the removal of her bad teeth and she will let us know when that is going to happen and will discuss with oral  surgeon is protocol for being off of Coumadin.  Arrie Senate MD

## 2017-06-15 NOTE — Patient Instructions (Addendum)
Medicare Annual Wellness Visit  [facility] and the medical providers at [facility] strive to bring you the best medical care.  In doing so we not only want to address your current medical conditions and concerns but also to detect new conditions early and prevent illness, disease and health-related problems.    Medicare offers a yearly Wellness Visit which allows our clinical staff to assess your need for preventative services including immunizations, lifestyle education, counseling to decrease risk of preventable diseases and screening for fall risk and other medical concerns.    This visit is provided free of charge (no copay) for all Medicare recipients. The clinical pharmacists at [facility] have begun to conduct these Wellness Visits which will also include a thorough review of all your medications.    As you primary medical provider recommend that you make an appointment for your Annual Wellness Visit if you have not done so already this year.  You may set up this appointment before you leave today or you may call back ([phone]) and schedule an appointment.  Please make sure when you call that you mention that you are scheduling your Annual Wellness Visit with the clinical pharmacist so that the appointment may be made for the proper length of time.    Continue current medications. Continue good therapeutic lifestyle changes which include good diet and exercise. Fall precautions discussed with patient. If an FOBT was given today- please return it to our front desk. If you are over 73 years old - you may need Prevnar 42 or the adult Pneumonia vaccine.  **Flu shots are available--- please call and schedule a FLU-CLINIC appointment**  After your visit with [place] today you will receive a survey in the mail or online from [employer] regarding your care with [place]. Please take a moment to fill this out. Your feedback is very  important to [place] as you can help [place] better understand your patient needs as well as improve your experience and satisfaction. WE CARE ABOUT YOU!!!   The patient will arrange follow-up appointment with Dr. Luan Pulling We will arrange follow-up appointment with the cardiologist, Dr. Percival Spanish She should be using her nebulizer treatments regularly using her oxygen as directed and taking her Mucinex regularly twice a day with a large glass of water to keep congestion loose She should follow-up with the oral surgeon for the removal of her bad teeth and she will let us know when that is going to happen and will discuss with oral surgeon is protocol for being off of Coumadin.

## 2017-06-16 LAB — CBC WITH DIFFERENTIAL/PLATELET
BASOS: 0 %
Basophils Absolute: 0 10*3/uL (ref 0.0–0.2)
EOS (ABSOLUTE): 0.2 10*3/uL (ref 0.0–0.4)
EOS: 2 %
HEMATOCRIT: 42.1 % (ref 34.0–46.6)
HEMOGLOBIN: 13.2 g/dL (ref 11.1–15.9)
IMMATURE GRANS (ABS): 0 10*3/uL (ref 0.0–0.1)
IMMATURE GRANULOCYTES: 0 %
LYMPHS: 18 %
Lymphocytes Absolute: 1.4 10*3/uL (ref 0.7–3.1)
MCH: 28.3 pg (ref 26.6–33.0)
MCHC: 31.4 g/dL — ABNORMAL LOW (ref 31.5–35.7)
MCV: 90 fL (ref 79–97)
MONOCYTES: 5 %
Monocytes Absolute: 0.4 10*3/uL (ref 0.1–0.9)
NEUTROS PCT: 75 %
Neutrophils Absolute: 5.9 10*3/uL (ref 1.4–7.0)
Platelets: 234 10*3/uL (ref 150–379)
RBC: 4.66 x10E6/uL (ref 3.77–5.28)
RDW: 15.7 % — ABNORMAL HIGH (ref 12.3–15.4)
WBC: 7.9 10*3/uL (ref 3.4–10.8)

## 2017-06-16 LAB — BMP8+EGFR
BUN/Creatinine Ratio: 13 (ref 12–28)
BUN: 18 mg/dL (ref 8–27)
CALCIUM: 9.1 mg/dL (ref 8.7–10.3)
CHLORIDE: 101 mmol/L (ref 96–106)
CO2: 28 mmol/L (ref 20–29)
Creatinine, Ser: 1.43 mg/dL — ABNORMAL HIGH (ref 0.57–1.00)
GFR, EST AFRICAN AMERICAN: 42 mL/min/{1.73_m2} — AB (ref >59)
GFR, EST NON AFRICAN AMERICAN: 36 mL/min/{1.73_m2} — AB (ref >59)
Glucose: 140 mg/dL — ABNORMAL HIGH (ref 65–99)
Potassium: 3.9 mmol/L (ref 3.5–5.2)
Sodium: 144 mmol/L (ref 134–144)

## 2017-06-16 LAB — HEPATIC FUNCTION PANEL
ALBUMIN: 4 g/dL (ref 3.5–4.8)
ALT: 10 IU/L (ref 0–32)
AST: 13 IU/L (ref 0–40)
Alkaline Phosphatase: 119 IU/L — ABNORMAL HIGH (ref 39–117)
BILIRUBIN TOTAL: 0.3 mg/dL (ref 0.0–1.2)
BILIRUBIN, DIRECT: 0.11 mg/dL (ref 0.00–0.40)
Total Protein: 6.1 g/dL (ref 6.0–8.5)

## 2017-06-16 LAB — LIPID PANEL
CHOL/HDL RATIO: 5.4 ratio — AB (ref 0.0–4.4)
Cholesterol, Total: 286 mg/dL — ABNORMAL HIGH (ref 100–199)
HDL: 53 mg/dL (ref >39)
LDL CALC: 165 mg/dL — AB (ref 0–99)
TRIGLYCERIDES: 340 mg/dL — AB (ref 0–149)
VLDL Cholesterol Cal: 68 mg/dL — ABNORMAL HIGH (ref 5–40)

## 2017-06-16 LAB — VITAMIN D 25 HYDROXY (VIT D DEFICIENCY, FRACTURES): Vit D, 25-Hydroxy: 24.9 ng/mL — ABNORMAL LOW (ref 30.0–100.0)

## 2017-06-21 DIAGNOSIS — M545 Low back pain: Secondary | ICD-10-CM | POA: Diagnosis not present

## 2017-06-21 DIAGNOSIS — I4891 Unspecified atrial fibrillation: Secondary | ICD-10-CM | POA: Diagnosis not present

## 2017-06-21 LAB — POCT INR: INR: 2.6 — AB (ref 0.9–1.1)

## 2017-06-23 ENCOUNTER — Ambulatory Visit: Payer: Self-pay | Admitting: *Deleted

## 2017-06-23 NOTE — Progress Notes (Signed)
mdINR report received Value of 2.6. Range 2.0-3.0. No change in dosage. Rck next week.

## 2017-06-27 ENCOUNTER — Ambulatory Visit (HOSPITAL_COMMUNITY): Payer: Medicare Other

## 2017-07-01 ENCOUNTER — Other Ambulatory Visit: Payer: Self-pay | Admitting: Family Medicine

## 2017-07-03 ENCOUNTER — Other Ambulatory Visit: Payer: Self-pay | Admitting: Family Medicine

## 2017-07-03 LAB — POCT INR: INR: 2.6 — AB (ref <=1.1)

## 2017-07-06 DIAGNOSIS — J449 Chronic obstructive pulmonary disease, unspecified: Secondary | ICD-10-CM | POA: Diagnosis not present

## 2017-07-13 ENCOUNTER — Ambulatory Visit (INDEPENDENT_AMBULATORY_CARE_PROVIDER_SITE_OTHER): Payer: Medicare Other | Admitting: *Deleted

## 2017-07-13 ENCOUNTER — Other Ambulatory Visit: Payer: Self-pay | Admitting: Family Medicine

## 2017-07-13 LAB — POCT INR: INR: 2.7 — AB (ref 0.9–1.1)

## 2017-07-13 NOTE — Progress Notes (Signed)
Normal INR of 2.7 today.  Left message on patient's voicemail to call back if she has had any medication changes or any problems. Otherwise we will see how it looks next week at her scheduled rck.

## 2017-07-14 ENCOUNTER — Encounter: Payer: Self-pay | Admitting: *Deleted

## 2017-07-22 DIAGNOSIS — I4891 Unspecified atrial fibrillation: Secondary | ICD-10-CM | POA: Diagnosis not present

## 2017-07-22 DIAGNOSIS — M545 Low back pain: Secondary | ICD-10-CM | POA: Diagnosis not present

## 2017-07-24 DIAGNOSIS — Z7901 Long term (current) use of anticoagulants: Secondary | ICD-10-CM | POA: Diagnosis not present

## 2017-07-24 DIAGNOSIS — I482 Chronic atrial fibrillation: Secondary | ICD-10-CM | POA: Diagnosis not present

## 2017-08-06 DIAGNOSIS — J449 Chronic obstructive pulmonary disease, unspecified: Secondary | ICD-10-CM | POA: Diagnosis not present

## 2017-08-08 ENCOUNTER — Ambulatory Visit: Payer: Self-pay | Admitting: *Deleted

## 2017-08-08 NOTE — Progress Notes (Signed)
Fax received mdINR PT/INR self testing service Test date/time 08/07/17 3:22 pm INR 2.5

## 2017-08-09 ENCOUNTER — Other Ambulatory Visit: Payer: Self-pay | Admitting: Family Medicine

## 2017-08-10 IMAGING — CT CT ABD-PELV W/O CM
2 of 4 series · 14 of 46 positions shown, 16 images · non-contrast
Comparison: None.

Chest x-ray of 04/22/2016 and CT angio chest of 10/18/2013

ADDENDUM:
On high-resolution inspiratory and expiratory images, no gas
trapping is seen.

Hepatobiliary: Liver is homogeneous. No focal liver lesion
identified. Gallbladder is present and normal in appearance.
Pancreas: Normal in appearance.
Spleen: Normal in appearance.
Renal/Adrenal: Right adrenal nodule is 3.2 cm. Left adrenal nodule
is 2.0 cm. Attenuation of these nodules is low, consistent with
benign adenomas. There is no hydronephrosis. Small right renal cyst
is 1.7 cm.
Gastrointestinal tract: The stomach and small bowel loops are normal
in appearance. Appendix is not seen. There are numerous colonic
diverticula particularly within the sigmoid colon. However no
inflammatory changes are identified to indicate acute
diverticulitis.
Reproductive/Pelvis: Status post hysterectomy. No adnexal mass. No
free pelvic fluid.
Vascular/Lymphatic: There is dense atherosclerotic calcification of
the abdominal aorta. There is a focal aneurysm measuring 3.2 cm on
image 34 series 2 in the infrarenal abdominal aorta. There is dense
atherosclerotic calcification of the renal arteries. No
retroperitoneal or mesenteric adenopathy.
Musculoskeletal/Abdominal wall: Postoperative changes are identified
in the lumbar spine. Lumbar and lower thoracic sclerosis noted
consistent with degenerative changes.
Other: Abdominal wall is unremarkable in appearance.
CLINICAL DATA: Shortness breath, nausea, weakness for several
months, abdominal pain and bloating
EXAM:
CT CHEST WITHOUT CONTRAST
TECHNIQUE: Multidetector CT imaging of the chest was performed following the
standard protocol without IV contrast.

[Series 2: axial st · axial · 0.89mm/px · z∈[-321,+34]mm · 11 of 83 slices shown, 13 images]
[im 8/83  soft-tissue]
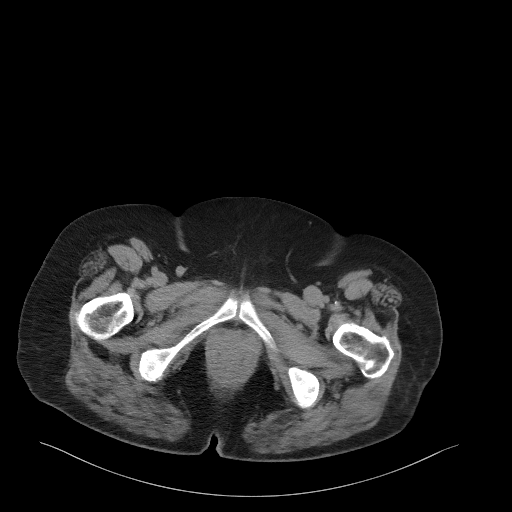
[im 8/83  bone]
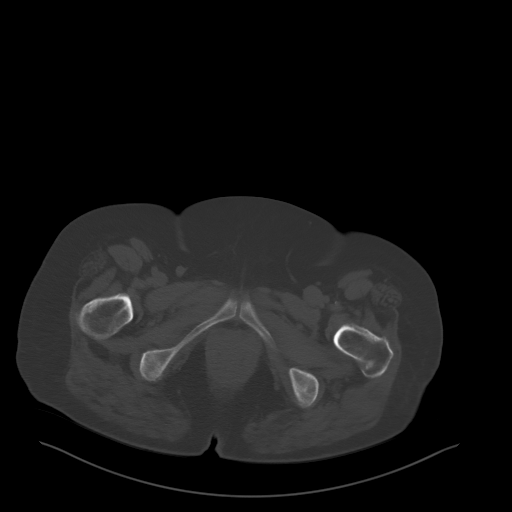
[im 15/83  soft-tissue]
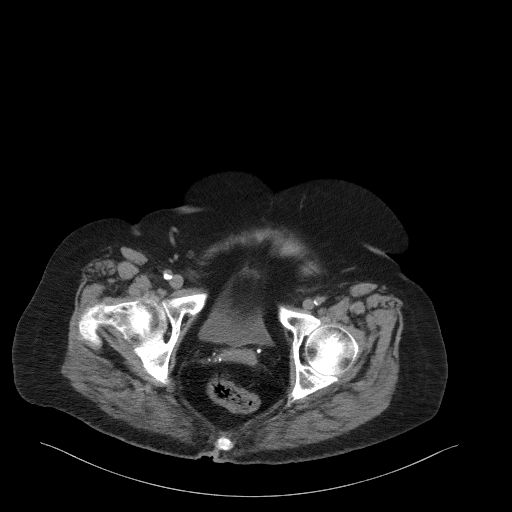
[im 22/83  soft-tissue]
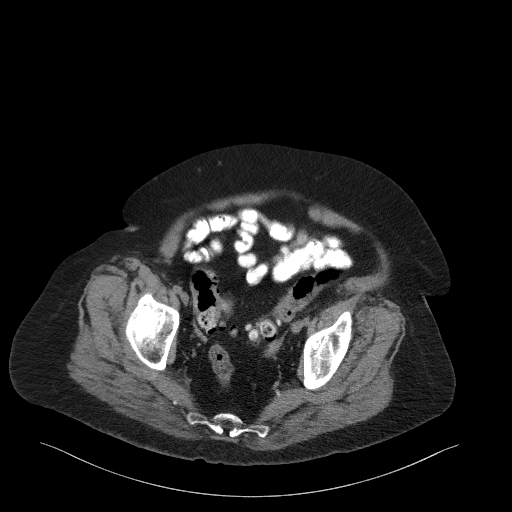
[im 29/83  soft-tissue]
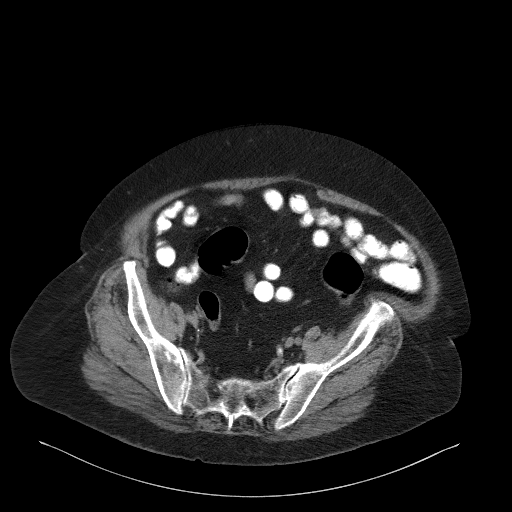
[im 36/83  soft-tissue]
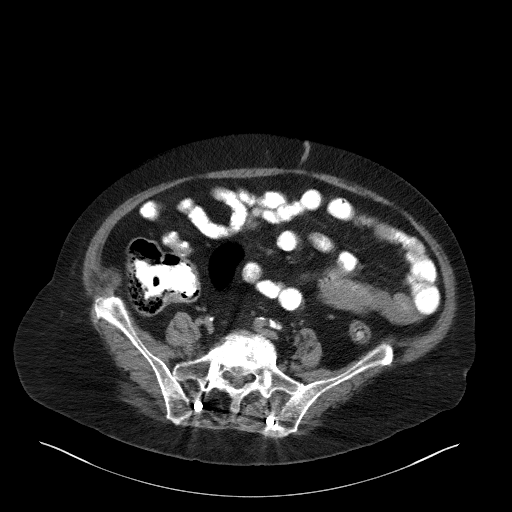
[im 43/83  soft-tissue]
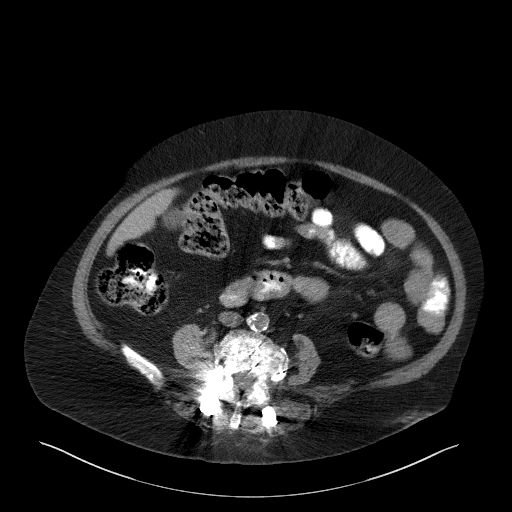
[im 50/83  soft-tissue]
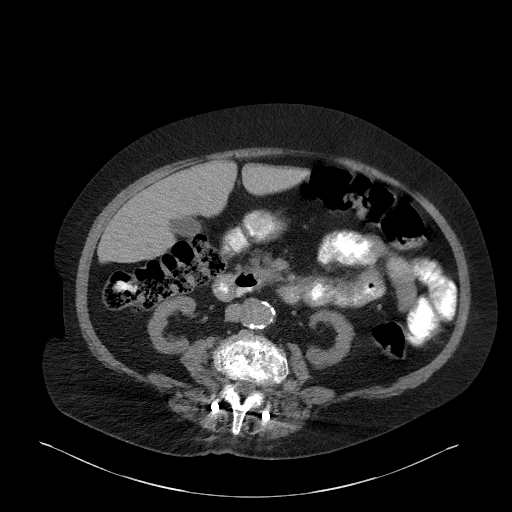
[im 58/83  soft-tissue]
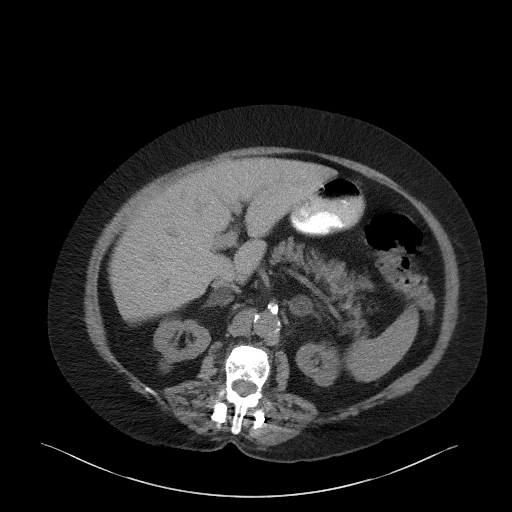
[im 65/83  soft-tissue]
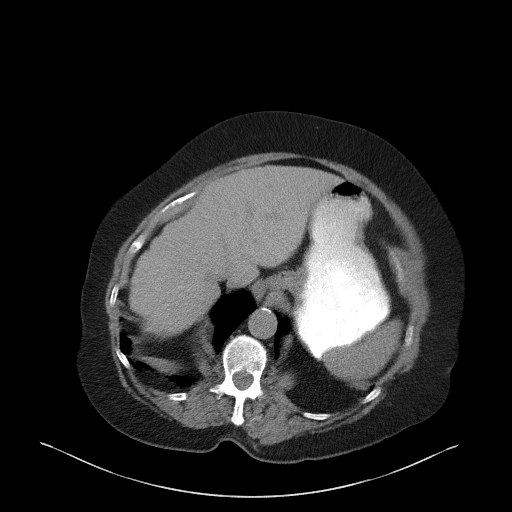
[im 65/83  bone]
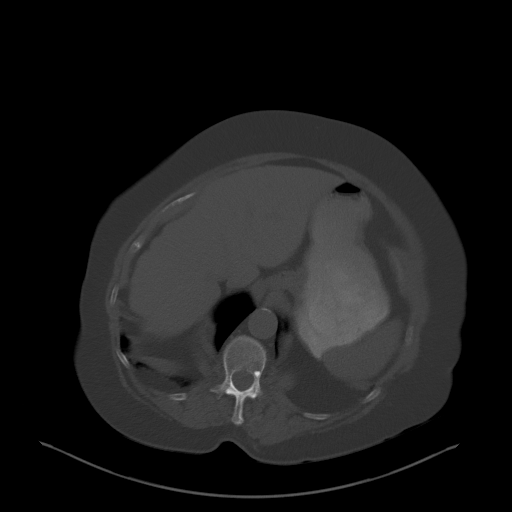
[im 72/83  soft-tissue]
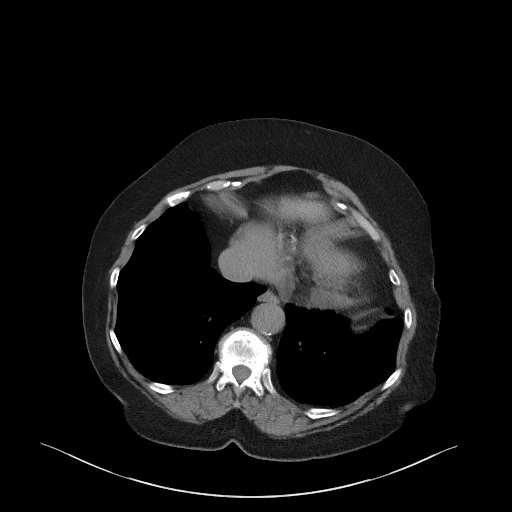
[im 79/83  soft-tissue]
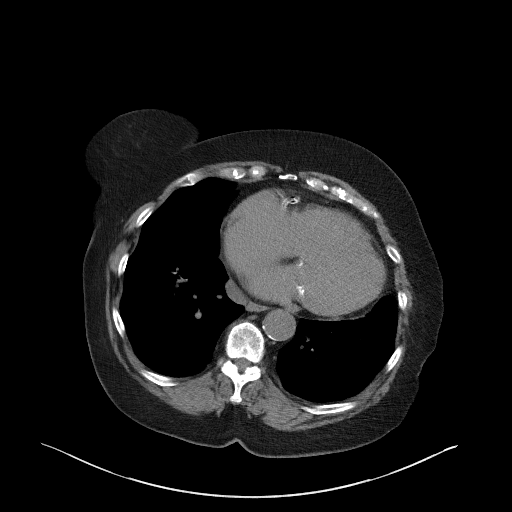

[Series 4: coronal st · coronal · 0.77mm/px · 3 of 110 slices shown]
[im 37/110  soft-tissue]
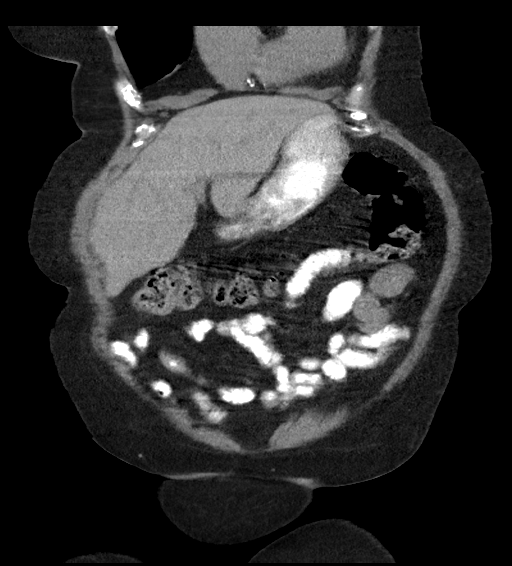
[im 49/110  soft-tissue]
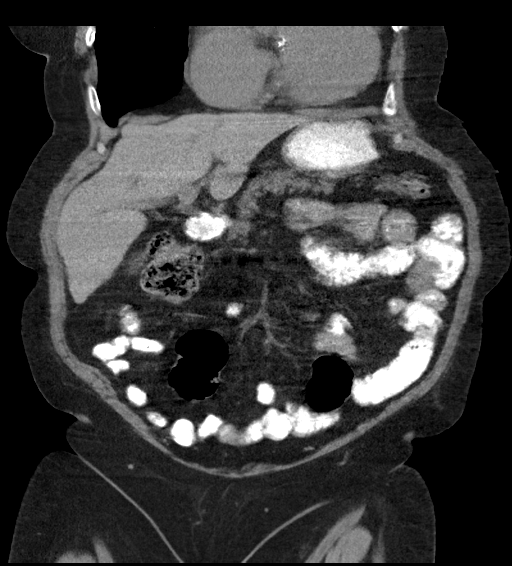
[im 61/110  soft-tissue]
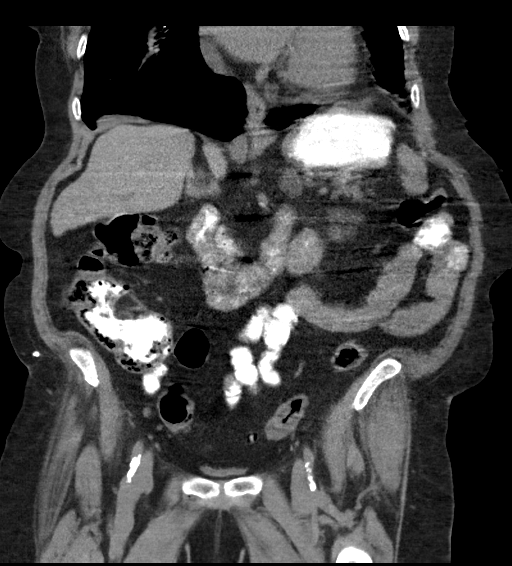

[14 of 46 positions shown; findings below may reference images not displayed]

IMPRESSION: 1. Bilateral benign adrenal adenomas.
2. Small right renal cyst.
3. Colonic diverticulosis without evidence for acute diverticulitis.
4. Infrarenal abdominal aortic aneurysm measuring 3.2 cm. Recommend
followup by ultrasound in 3 years. This recommendation follows ACR
consensus guidelines: White Paper of the ACR Incidental Findings
5. Postoperative changes in the lumbar spine.
6. Status post hysterectomy.
FINDINGS: CT CHEST FINDINGS

Cardiovascular: The heart remains mildly enlarged. No pericardial
effusion is seen. A small oval fluid collection is noted posterior
to the right heart border probably represents a small pericardial
cyst of 3.5 cm with attenuation of 8 HU. Coronary artery
calcifications are present primarily in the distribution of the left
anterior descending artery. Moderate thoracic aortic atherosclerosis
is noted. The mid ascending thoracic aorta measures 3.5 cm.

Mediastinum/Nodes: On this unenhanced study, only small mediastinal
lymph nodes are present. No adenopathy is seen. The thyroid gland is
unremarkable. The esophagus appears normal.

Lungs/Pleura: The previously noted parenchymal opacity within the
upper lobes has cleared and may have represented an infectious or
inflammatory process at that time. There are changes of
centrilobular emphysema noted. No parenchymal infiltrate or pleural
effusion is currently seen. No suspicious lung nodule is noted. Only
a vague nodular opacity is present anteriorly within the right lower
lobe posterior to the major fissure probably representing scarring
but attention this area on followup CT the chest is recommended. 3
mm noncalcified nodules noted subpleural in the left upper lobe on
image 60 series 5 of doubtful significance but this also can be
followed on a followup CT the chest. An additional 3 mm somewhat
more dense nodule is present in the periphery of the left upper lobe
on image 29. A 3 mm noncalcified nodule is present in the right
lower lobe antral laterally on image 119. No follow-up needed if
patient is low-risk (and has no known or suspected primary
neoplasm). Non-contrast chest CT can be considered in 12 months if
patient is high-risk. This recommendation follows the consensus
statement: Guidelines for Management of Incidental Pulmonary Nodules
Detected on CT Images: From the [HOSPITAL] 1277; Radiology
1277; [DATE].

Musculoskeletal: The thoracic vertebrae are normal alignment. There
is degenerative disc disease in the lower thoracic and upper lumbar
spine. No compression deformity is seen.

Upper abdomen: No significant abnormality of the upper abdomen is
seen other than apparent bilateral adrenal adenomas and a cyst
exophytically emanating from the upper pole of the right kidney.
IMPRESSION: 1. Several small noncalcified lung nodules bilaterally. No follow-up
needed if patient is low-risk. Non-contrast chest CT can be
considered in 12 months if patient is high-risk. This recommendation
follows the consensus statement: Guidelines for Management of
Incidental Pulmonary Nodules Detected on CT Images: From the
2. Left anterior descending coronary artery calcifications.
3. Moderate thoracic aortic atherosclerosis.
4. Probable bilateral adrenal adenomas.
5. Question small pericardial cyst posterior to the right heart
border as noted above.

## 2017-08-15 ENCOUNTER — Ambulatory Visit: Payer: Self-pay | Admitting: *Deleted

## 2017-08-15 NOTE — Progress Notes (Signed)
Fax received mdINR PT/INR self testing service Test date/time 08/15/17 10:02 INR 2.5

## 2017-08-15 NOTE — Progress Notes (Signed)
Pt aware.

## 2017-08-15 NOTE — Progress Notes (Signed)
Patient should continue with her current Coumadin treatment and repeat the in a couple of weeks

## 2017-08-22 DIAGNOSIS — I4891 Unspecified atrial fibrillation: Secondary | ICD-10-CM | POA: Diagnosis not present

## 2017-08-22 DIAGNOSIS — M545 Low back pain: Secondary | ICD-10-CM | POA: Diagnosis not present

## 2017-09-02 ENCOUNTER — Other Ambulatory Visit: Payer: Self-pay | Admitting: Cardiology

## 2017-09-05 ENCOUNTER — Ambulatory Visit: Payer: Self-pay

## 2017-09-05 DIAGNOSIS — Z7901 Long term (current) use of anticoagulants: Secondary | ICD-10-CM | POA: Diagnosis not present

## 2017-09-05 DIAGNOSIS — I482 Chronic atrial fibrillation: Secondary | ICD-10-CM | POA: Diagnosis not present

## 2017-09-05 NOTE — Progress Notes (Signed)
Fax received mdINR PT/INR self testing service Test date/time:  09/05/2017 at 2:12 pm INR:  2.8

## 2017-09-06 DIAGNOSIS — J449 Chronic obstructive pulmonary disease, unspecified: Secondary | ICD-10-CM | POA: Diagnosis not present

## 2017-09-07 ENCOUNTER — Other Ambulatory Visit: Payer: Self-pay | Admitting: Family Medicine

## 2017-09-11 ENCOUNTER — Other Ambulatory Visit: Payer: Self-pay | Admitting: *Deleted

## 2017-09-11 DIAGNOSIS — M4716 Other spondylosis with myelopathy, lumbar region: Secondary | ICD-10-CM | POA: Insufficient documentation

## 2017-09-11 MED ORDER — ATORVASTATIN CALCIUM 80 MG PO TABS: 80.0000 mg | ORAL_TABLET | Freq: Every day | ORAL | 0 refills | Status: DC

## 2017-09-11 NOTE — Telephone Encounter (Signed)
Next Ov 10/11/17

## 2017-09-14 ENCOUNTER — Telehealth: Payer: Self-pay | Admitting: *Deleted

## 2017-09-14 NOTE — Telephone Encounter (Signed)
Pt daughter aware to cont same dose

## 2017-09-14 NOTE — Telephone Encounter (Signed)
Fax received mdINR PT/INR self testing service Test date/time 09/14/17 10:05 INR 2.5

## 2017-09-14 NOTE — Telephone Encounter (Signed)
inr in good range continue current coumadin dose and recheck in 4 weeks

## 2017-09-18 ENCOUNTER — Other Ambulatory Visit: Payer: Self-pay | Admitting: Family Medicine

## 2017-09-19 DIAGNOSIS — M545 Low back pain: Secondary | ICD-10-CM | POA: Diagnosis not present

## 2017-09-19 DIAGNOSIS — I4891 Unspecified atrial fibrillation: Secondary | ICD-10-CM | POA: Diagnosis not present

## 2017-09-20 ENCOUNTER — Telehealth: Payer: Self-pay | Admitting: *Deleted

## 2017-09-20 ENCOUNTER — Telehealth: Payer: Self-pay | Admitting: Family Medicine

## 2017-09-20 MED ORDER — AZITHROMYCIN 250 MG PO TABS: ORAL_TABLET | ORAL | 0 refills | Status: DC

## 2017-09-20 NOTE — Telephone Encounter (Signed)
What symptoms do you have? URI low grade fever 99.1 cough with mucus ans sob but she is doing her breathing treatments  How long have you been sick? yesterday  Have you been seen for this problem? no  If your provider decides to give you a prescription, which pharmacy would you like for it to be sent to? [facility] in [place]   Patient informed that this information will be sent to the clinical staff for review and that they should receive a follow up call.

## 2017-09-20 NOTE — Telephone Encounter (Signed)
Because she is taking Coumadin, please make sure that a repeat INR is done when the Z-Pak is completed.  This is important in light of her most recent INR.

## 2017-09-20 NOTE — Telephone Encounter (Signed)
Notified of repeat INR Verbalizes understanding

## 2017-09-20 NOTE — Telephone Encounter (Signed)
Continue Coumadin as currently doing and recheck pro time in 1-2 weeks

## 2017-09-20 NOTE — Telephone Encounter (Signed)
Notified to continue current dose Verbalizes understanding

## 2017-09-20 NOTE — Telephone Encounter (Signed)
Please call in a Z-Pak for this patient and she should continue to use her nebulizer treatments cough medicine and drinking plenty of fluids and if she gets worse she should give [place] a call back or see the pulmonologist

## 2017-09-20 NOTE — Telephone Encounter (Signed)
Pt notified of RX and recommendation Verbalizes understanding

## 2017-09-20 NOTE — Telephone Encounter (Signed)
Fax received mdINR PT/INR self testing service Test date/time 09/20/17 11:15 am INR 2.7

## 2017-09-28 ENCOUNTER — Telehealth: Payer: Self-pay | Admitting: *Deleted

## 2017-09-28 NOTE — Telephone Encounter (Signed)
lmtcb

## 2017-09-28 NOTE — Telephone Encounter (Signed)
Continue current dose and recheck in 4 weeks

## 2017-09-28 NOTE — Telephone Encounter (Signed)
Fax received mdINR PT/INR self testing service Test date/time 09/28/17 12:07 INR 2.4

## 2017-09-30 ENCOUNTER — Other Ambulatory Visit: Payer: Self-pay | Admitting: Family Medicine

## 2017-10-04 DIAGNOSIS — J449 Chronic obstructive pulmonary disease, unspecified: Secondary | ICD-10-CM | POA: Diagnosis not present

## 2017-10-09 ENCOUNTER — Encounter: Payer: Self-pay | Admitting: Family Medicine

## 2017-10-09 DIAGNOSIS — I482 Chronic atrial fibrillation: Secondary | ICD-10-CM | POA: Diagnosis not present

## 2017-10-09 DIAGNOSIS — Z7901 Long term (current) use of anticoagulants: Secondary | ICD-10-CM | POA: Diagnosis not present

## 2017-10-09 LAB — PROTIME-INR: INR: 2.3 — AB (ref 0.9–1.1)

## 2017-10-11 ENCOUNTER — Ambulatory Visit: Payer: Medicare Other | Admitting: Family Medicine

## 2017-10-11 ENCOUNTER — Telehealth: Payer: Self-pay | Admitting: *Deleted

## 2017-10-11 NOTE — Telephone Encounter (Signed)
LMOVM on both of pt's phone numbers to continue current coumadin dose per Dr. Laurance Flatten  Fax received mdINR PT/INR self testing service Test date/time 10/09/17 4:31 pm INR 2.3

## 2017-10-16 ENCOUNTER — Other Ambulatory Visit: Payer: Self-pay | Admitting: Cardiology

## 2017-10-17 NOTE — Telephone Encounter (Signed)
Rx has been sent to the pharmacy electronically. ° °

## 2017-10-20 DIAGNOSIS — I4891 Unspecified atrial fibrillation: Secondary | ICD-10-CM | POA: Diagnosis not present

## 2017-10-20 DIAGNOSIS — M545 Low back pain: Secondary | ICD-10-CM | POA: Diagnosis not present

## 2017-10-25 ENCOUNTER — Telehealth: Payer: Self-pay | Admitting: *Deleted

## 2017-10-25 NOTE — Telephone Encounter (Signed)
Pt daughter aware by VM

## 2017-10-25 NOTE — Telephone Encounter (Signed)
Fax received mdINR PT/INR self testing service Test date/time 10/24/17 4:32 pm INR 2.3

## 2017-10-28 ENCOUNTER — Other Ambulatory Visit: Payer: Self-pay | Admitting: Family Medicine

## 2017-11-04 DIAGNOSIS — J449 Chronic obstructive pulmonary disease, unspecified: Secondary | ICD-10-CM | POA: Diagnosis not present

## 2017-11-06 ENCOUNTER — Telehealth: Payer: Self-pay | Admitting: *Deleted

## 2017-11-06 NOTE — Telephone Encounter (Signed)
Carol aware by VM of mothers results

## 2017-11-06 NOTE — Telephone Encounter (Signed)
Continue current treatment and repeat INR at the current frequency level and make sure that we get copies of these reports.

## 2017-11-06 NOTE — Telephone Encounter (Signed)
Fax received mdINR PT/INR self testing service Test date/time 11/03/17 10:56 pm INR 2.7

## 2017-11-08 ENCOUNTER — Encounter: Payer: Self-pay | Admitting: Family Medicine

## 2017-11-08 ENCOUNTER — Ambulatory Visit (INDEPENDENT_AMBULATORY_CARE_PROVIDER_SITE_OTHER): Payer: Medicare Other | Admitting: Family Medicine

## 2017-11-08 ENCOUNTER — Ambulatory Visit: Payer: Self-pay

## 2017-11-08 ENCOUNTER — Ambulatory Visit (INDEPENDENT_AMBULATORY_CARE_PROVIDER_SITE_OTHER): Payer: Medicare Other

## 2017-11-08 VITALS — BP 114/72 | HR 50 | Temp 98.2°F | Ht 64.0 in | Wt 163.0 lb

## 2017-11-08 DIAGNOSIS — I4821 Permanent atrial fibrillation: Secondary | ICD-10-CM

## 2017-11-08 DIAGNOSIS — M5440 Lumbago with sciatica, unspecified side: Secondary | ICD-10-CM

## 2017-11-08 DIAGNOSIS — S61303A Unspecified open wound of left middle finger with damage to nail, initial encounter: Secondary | ICD-10-CM | POA: Diagnosis not present

## 2017-11-08 DIAGNOSIS — J439 Emphysema, unspecified: Secondary | ICD-10-CM

## 2017-11-08 DIAGNOSIS — I700 Atherosclerosis of aorta: Secondary | ICD-10-CM | POA: Diagnosis not present

## 2017-11-08 DIAGNOSIS — E7800 Pure hypercholesterolemia, unspecified: Secondary | ICD-10-CM

## 2017-11-08 DIAGNOSIS — G8929 Other chronic pain: Secondary | ICD-10-CM

## 2017-11-08 DIAGNOSIS — M79645 Pain in left finger(s): Secondary | ICD-10-CM | POA: Diagnosis not present

## 2017-11-08 DIAGNOSIS — I714 Abdominal aortic aneurysm, without rupture, unspecified: Secondary | ICD-10-CM

## 2017-11-08 DIAGNOSIS — E559 Vitamin D deficiency, unspecified: Secondary | ICD-10-CM | POA: Diagnosis not present

## 2017-11-08 DIAGNOSIS — M79646 Pain in unspecified finger(s): Secondary | ICD-10-CM

## 2017-11-08 DIAGNOSIS — I482 Chronic atrial fibrillation: Secondary | ICD-10-CM

## 2017-11-08 DIAGNOSIS — I272 Pulmonary hypertension, unspecified: Secondary | ICD-10-CM

## 2017-11-08 DIAGNOSIS — I10 Essential (primary) hypertension: Secondary | ICD-10-CM | POA: Diagnosis not present

## 2017-11-08 MED ORDER — DULOXETINE HCL 30 MG PO CPEP: 30.0000 mg | ORAL_CAPSULE | Freq: Every day | ORAL | 1 refills | Status: DC

## 2017-11-08 NOTE — Patient Instructions (Addendum)
Medicare Annual Wellness Visit  [facility] and the medical providers at [facility] strive to bring you the best medical care.  In doing so we not only want to address your current medical conditions and concerns but also to detect new conditions early and prevent illness, disease and health-related problems.    Medicare offers a yearly Wellness Visit which allows our clinical staff to assess your need for preventative services including immunizations, lifestyle education, counseling to decrease risk of preventable diseases and screening for fall risk and other medical concerns.    This visit is provided free of charge (no copay) for all Medicare recipients. The clinical pharmacists at [facility] have begun to conduct these Wellness Visits which will also include a thorough review of all your medications.    As you primary medical provider recommend that you make an appointment for your Annual Wellness Visit if you have not done so already this year.  You may set up this appointment before you leave today or you may call back ([phone]) and schedule an appointment.  Please make sure when you call that you mention that you are scheduling your Annual Wellness Visit with the clinical pharmacist so that the appointment may be made for the proper length of time.     Continue current medications. Continue good therapeutic lifestyle changes which include good diet and exercise. Fall precautions discussed with patient. If an FOBT was given today- please return it to our front desk. If you are over 61 years old - you may need Prevnar 79 or the adult Pneumonia vaccine.  **Flu shots are available--- please call and schedule a FLU-CLINIC appointment**  After your visit with [place] today you will receive a survey in the mail or online from [employer] regarding your care with [place]. Please take a moment to fill this out. Your feedback is very  important to [place] as you can help [place] better understand your patient needs as well as improve your experience and satisfaction. WE CARE ABOUT YOU!!!   Continue checking protimes at home and send [place] those results Continue to follow-up with pulmonology as planned Continue to follow through with dental work as planned We will call with results of the lab work as soon as that becomes available Continue to see cardiology as planned

## 2017-11-08 NOTE — Progress Notes (Signed)
Subjective:    Patient ID: Melinda Collins, female    DOB: 01-08-1941, 77 y.o.   MRN: [id number]  HPI Pt here for follow up and management of chronic medical problems which includes hypertension and hyperlipidemia. She is taking medication regularly.  This patient is doing well overall except for her ongoing chronic back pain and neuropathy.  She also has chronic atrial fibrillation for which she takes Coumadin and does home Coumadin checks.  We get these periodically and let her know that we have received them in have not had to do a lot of regulating with this.  She did have a CT scan of the chest in October 2017.  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  She has postoperative changes in the lumbar spine and has had a hysterectomy.  We will go ahead and arrange to get her abdominal ultrasound sooner rather than later just so that we are reassured that this infrarenal aneurysm is not enlarging anymore.  The patient denies chest pain anymore than usual.  Her shortness of breath is stable as she does have COPD and she does use her handheld inhalers fairly regularly and the nebulizer occasionally.  She periodically does see the pulmonologist, Dr. Luan Pulling.  The patient's relative comes with her and is very supportive of her.  She has no trouble with her stomach including nausea vomiting diarrhea blood in the stool or black tarry bowel movements.  She is passing her water without problems.  She is also currently working on the removal of teeth so that she can get new partials and for upper and lower to help with her eating.  She has a lesion on the third finger of the left hand that looks like a wart but continues to get inflamed  periodically and drains. The patient's brother recently passed away and his name was at [facility].  [name], her daughter-in-law comes with her today and is upset early is very supportive of her mother-in-law.   Patient Active Problem List   Diagnosis Date Noted  . Pulmonary nodules 02/08/2017  . Aortic atherosclerosis ([place]) 04/22/2016  . Permanent atrial fibrillation ([place]) 10/16/2014  . Metabolic syndrome 30/03/2329  . History of uterine cancer 12/03/2013  . Chronic low back pain 05/23/2013  . High risk medication use 05/23/2013  . Swelling of limb 05/03/2013  . Hypertension 05/17/2011  . Hyperlipidemia 02/10/2009  . TOBACCO ABUSE 02/10/2009  . 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 Atrial fibrillation ([place]) 02/10/2009   Outpatient Encounter Medications as of 11/08/2017  Medication Sig  . albuterol (PROVENTIL) (2.5 MG/3ML) 0.083% nebulizer solution INHALE CONTENTS OF 1 VIAL IN NEBULIZER EVERY 6 HOURS AS NEEDED FOR FOR WHEEZING OR SHORTNESS OF BREATH.  Marland Kitchen ALLERGY RELIEF 10 MG tablet TAKE ONE TABLET BY MOUTH DAILY.  Marland Kitchen amLODipine (NORVASC) 5 MG tablet TAKE ONE TABLET BY MOUTH ONCE DAILY  . atorvastatin (LIPITOR) 80 MG tablet Take 1 tablet (80 mg total) by mouth daily.  Marland Kitchen azelastine (ASTELIN) 0.1 % nasal spray USE ONE SPRAY IN EACH NOSTRIL TWICE DAILY  . BREO ELLIPTA 100-25 MCG/INH AEPB Take 1 puff by mouth daily.  . Cholecalciferol (VITAMIN D-3) 1000 units CAPS Take 1 capsule by mouth daily.  . cyclobenzaprine (FLEXERIL) 10 MG  tablet Take 10 mg by mouth 3 (three) times daily.  Marland Kitchen diltiazem (CARDIZEM) 30 MG tablet Take 1 tablet (30 mg total) by mouth 2 (two) times daily. Need appointment  . fentaNYL (DURAGESIC - DOSED MCG/HR) 50 MCG/HR Place 50 mcg onto the skin every 3 (three) days.  . fluticasone (FLONASE) 50 MCG/ACT nasal spray USE 2 SPRAYS IN EACH NOSTRIL ONCE DAILY. SHAKE GENTLY BEFORE USING.  Marland Kitchen guaiFENesin (MUCINEX) 600 MG 12 hr tablet Take 1 tablet (600 mg total) by mouth 2 (two) times  daily as needed.  . INCRUSE ELLIPTA 62.5 MCG/INH AEPB Take 1 puff by mouth daily.  . Multiple Vitamins-Minerals (CENTRUM SILVER PO) Take 1 tablet by mouth daily.    . NON FORMULARY Oxygen 3L/min   . oxyCODONE ER (XTAMPZA ER) 13.5 MG C12A Take 1 tablet by mouth 2 (two) times daily.  Marland Kitchen oxyCODONE-acetaminophen (PERCOCET) 5-325 MG per tablet Take 2 tablets by mouth every 8 (eight) hours as needed.    Marland Kitchen PARoxetine (PAXIL) 40 MG tablet TAKE 1 AND 1/2 TABLETS BY MOUTH DAILY.  Marland Kitchen potassium chloride (K-DUR,KLOR-CON) 10 MEQ tablet TAKE ONE TABLET BY MOUTH DAILY.  Marland Kitchen PROAIR HFA 108 (90 Base) MCG/ACT inhaler INHALE 1-2 PUFFS EVERY 4 TO 6 HOURS AS NEEDED SHORTNESS OF BREATH  . torsemide (DEMADEX) 20 MG tablet TAKE 1 AND 1/2 TABLETS BY MOUTH ONCE DAILY.  Marland Kitchen warfarin (COUMADIN) 5 MG tablet TAKE 1 TO 2 TABLETS BY MOUTH AS DIRECTED BY ANTICOAGULATION CLINIC.  . [DISCONTINUED] azithromycin (ZITHROMAX) 250 MG tablet Take 2 tablets on Day 1 then 1 tablet daily until complete   No facility-administered encounter medications on file as of 11/08/2017.       Review of Systems  Constitutional: Negative.   HENT: Negative.   Eyes: Negative.   Respiratory: Negative.   Cardiovascular: Negative.   Gastrointestinal: Negative.   Endocrine: Negative.   Genitourinary: Negative.   Musculoskeletal: Negative.   Skin: Positive for wound (left middle finger ).  Allergic/Immunologic: Negative.   Neurological: Negative.   Hematological: Negative.   Psychiatric/Behavioral: Negative.        Objective:   Physical Exam  Constitutional: She is oriented to person, place, and time. She appears well-developed and well-nourished. No distress.  Elderly but alert and pleasant.  HENT:  Head: Normocephalic and atraumatic.  Right Ear: External ear normal.  Left Ear: External ear normal.  Nose: Nose normal.  Mouth/Throat: Oropharynx is clear and moist. No oropharyngeal exudate.  Eyes: Pupils are equal, round, and reactive to  light. Conjunctivae and EOM are normal. Right eye exhibits no discharge. Left eye exhibits no discharge. No scleral icterus.  Neck: Normal range of motion. Neck supple. No thyromegaly present.  No bruits or thyromegaly  Cardiovascular: Normal rate, normal heart sounds and intact distal pulses.  No murmur heard. Heart is irregular irregular at 84/min  Pulmonary/Chest: Effort normal. No respiratory distress. She has wheezes. She has no rales.  There is chest tightness and diminished airway movement with coughing and taking deep inhalations.  Abdominal: Soft. Bowel sounds are normal. She exhibits no mass. There is no tenderness. There is no rebound and no guarding.  Musculoskeletal: She exhibits tenderness. She exhibits no edema.  The patient does not do a lot of walking because of her chronic back pain and multiple surgeries.  The pain is in the mid low back and radiates down both legs.  She is in a wheelchair today.  She was examined in the wheelchair.  There is tenderness in  the low back midline and to both sides.  Lymphadenopathy:    She has no cervical adenopathy.  Neurological: She is alert and oriented to person, place, and time.  Skin: Skin is warm and dry. No rash noted. No erythema.  There is a warty type lesion on the third finger of the left hand we will get an x-ray to make sure there is no foreign body.  Psychiatric: She has a normal mood and affect. Her behavior is normal. Judgment and thought content normal.  Nursing note and vitals reviewed.  BP 114/72 (BP Location: Left Arm)   Pulse (!) 50   Temp 98.2 F (36.8 C) (Oral)   Ht '5\' 4"'  (1.626 m)   Wt 163 lb (73.9 kg)   BMI 27.98 kg/m   X-ray of third finger of left hand lesion with results pending      Assessment & Plan:  1. Essential hypertension -Blood pressure is good today and she will continue with current treatment - BMP8+EGFR - CBC with Differential/Platelet - Hepatic function panel  2. Pure  hypercholesterolemia -Continue with atorvastatin pending results of lab work - CBC with Differential/Platelet - Lipid panel  3. Vitamin D deficiency -Continue with vitamin D replacement - CBC with Differential/Platelet - VITAMIN D 25 Hydroxy (Vit-D Deficiency, Fractures)  4. Permanent atrial fibrillation [facility]) -Follow-up with cardiology as planned and the patient today continues to remain in atrial fibrillation - CBC with Differential/Platelet  5. Aortic atherosclerosis ([place]) -Continue with as aggressive therapeutic lifestyle changes as possible including statin drug exercise and diet if possible - CBC with Differential/Platelet  6. Chronic pulmonary hypertension ([place]) -Follow-up with pulmonology as planned  7. Chronic midline low back pain with sciatica, sciatica laterality unspecified -Follow-up with neurosurgery as needed  8. Abdominal aortic aneurysm (AAA) without rupture (HCC) -Repeat ultrasound as recommended from last CT scan of this infra abdominal aortic aneurysm  9. Pulmonary emphysema, unspecified emphysema type ([place]) -Follow-up with pulmonology and continue to use inhalers and nebulizer treatments as currently doing  10. Pain of finger, unspecified laterality -X-ray finger and if no foreign body send to dermatologist to have this lesion removed - DG Hand Complete Left; Future - DG Finger Middle Left; Future  Patient Instructions                       Medicare Annual Wellness Visit  [facility] and the medical providers at [facility] strive to bring you the best medical care.  In doing so we not only want to address your current medical conditions and concerns but also to detect new conditions early and prevent illness, disease and health-related problems.    Medicare offers a yearly Wellness Visit which allows our clinical staff to assess your need for preventative services including immunizations, lifestyle education, counseling to decrease  risk of preventable diseases and screening for fall risk and other medical concerns.    This visit is provided free of charge (no copay) for all Medicare recipients. The clinical pharmacists at [facility] have begun to conduct these Wellness Visits which will also include a thorough review of all your medications.    As you primary medical provider recommend that you make an appointment for your Annual Wellness Visit if you have not done so already this year.  You may set up this appointment before you leave today or you may call back ([phone]) and schedule an appointment.  Please make sure when you call that  you mention that you are scheduling your Annual Wellness Visit with the clinical pharmacist so that the appointment may be made for the proper length of time.     Continue current medications. Continue good therapeutic lifestyle changes which include good diet and exercise. Fall precautions discussed with patient. If an FOBT was given today- please return it to our front desk. If you are over 36 years old - you may need Prevnar 80 or the adult Pneumonia vaccine.  **Flu shots are available--- please call and schedule a FLU-CLINIC appointment**  After your visit with [place] today you will receive a survey in the mail or online from [employer] regarding your care with [place]. Please take a moment to fill this out. Your feedback is very important to [place] as you can help [place] better understand your patient needs as well as improve your experience and satisfaction. WE CARE ABOUT YOU!!!   Continue checking protimes at home and send [place] those results Continue to follow-up with pulmonology as planned Continue to follow through with dental work as planned We will call with results of the lab work as soon as that becomes available Continue to see cardiology as planned  Arrie Senate MD

## 2017-11-09 LAB — CBC WITH DIFFERENTIAL/PLATELET
BASOS: 0 %
Basophils Absolute: 0 10*3/uL (ref 0.0–0.2)
EOS (ABSOLUTE): 0.2 10*3/uL (ref 0.0–0.4)
EOS: 3 %
HEMATOCRIT: 41.2 % (ref 34.0–46.6)
HEMOGLOBIN: 12.9 g/dL (ref 11.1–15.9)
Immature Grans (Abs): 0 10*3/uL (ref 0.0–0.1)
Immature Granulocytes: 0 %
Lymphocytes Absolute: 1.4 10*3/uL (ref 0.7–3.1)
Lymphs: 20 %
MCH: 28 pg (ref 26.6–33.0)
MCHC: 31.3 g/dL — ABNORMAL LOW (ref 31.5–35.7)
MCV: 90 fL (ref 79–97)
MONOCYTES: 7 %
MONOS ABS: 0.5 10*3/uL (ref 0.1–0.9)
Neutrophils Absolute: 5 10*3/uL (ref 1.4–7.0)
Neutrophils: 70 %
Platelets: 202 10*3/uL (ref 150–379)
RBC: 4.6 x10E6/uL (ref 3.77–5.28)
RDW: 16 % — AB (ref 12.3–15.4)
WBC: 7.2 10*3/uL (ref 3.4–10.8)

## 2017-11-09 LAB — HEPATIC FUNCTION PANEL
ALT: 9 IU/L (ref 0–32)
AST: 16 IU/L (ref 0–40)
Albumin: 3.7 g/dL (ref 3.5–4.8)
Alkaline Phosphatase: 129 IU/L — ABNORMAL HIGH (ref 39–117)
BILIRUBIN TOTAL: 0.4 mg/dL (ref 0.0–1.2)
Bilirubin, Direct: 0.13 mg/dL (ref 0.00–0.40)
Total Protein: 5.7 g/dL — ABNORMAL LOW (ref 6.0–8.5)

## 2017-11-09 LAB — BMP8+EGFR
BUN / CREAT RATIO: 12 (ref 12–28)
BUN: 16 mg/dL (ref 8–27)
CO2: 28 mmol/L (ref 20–29)
CREATININE: 1.35 mg/dL — AB (ref 0.57–1.00)
Calcium: 8.8 mg/dL (ref 8.7–10.3)
Chloride: 103 mmol/L (ref 96–106)
GFR calc Af Amer: 44 mL/min/{1.73_m2} — ABNORMAL LOW (ref >59)
GFR, EST NON AFRICAN AMERICAN: 38 mL/min/{1.73_m2} — AB (ref >59)
Glucose: 119 mg/dL — ABNORMAL HIGH (ref 65–99)
Potassium: 3.8 mmol/L (ref 3.5–5.2)
SODIUM: 142 mmol/L (ref 134–144)

## 2017-11-09 LAB — LIPID PANEL
CHOL/HDL RATIO: 3.4 ratio (ref 0.0–4.4)
Cholesterol, Total: 172 mg/dL (ref 100–199)
HDL: 51 mg/dL (ref >39)
LDL CALC: 71 mg/dL (ref 0–99)
TRIGLYCERIDES: 248 mg/dL — AB (ref 0–149)
VLDL CHOLESTEROL CAL: 50 mg/dL — AB (ref 5–40)

## 2017-11-09 LAB — VITAMIN D 25 HYDROXY (VIT D DEFICIENCY, FRACTURES): VIT D 25 HYDROXY: 25.3 ng/mL — AB (ref 30.0–100.0)

## 2017-11-10 ENCOUNTER — Other Ambulatory Visit: Payer: Self-pay | Admitting: *Deleted

## 2017-11-10 DIAGNOSIS — L989 Disorder of the skin and subcutaneous tissue, unspecified: Secondary | ICD-10-CM

## 2017-11-13 ENCOUNTER — Other Ambulatory Visit: Payer: Self-pay | Admitting: Family Medicine

## 2017-11-13 ENCOUNTER — Telehealth: Payer: Self-pay | Admitting: *Deleted

## 2017-11-13 NOTE — Telephone Encounter (Signed)
Fax received mdINR PT/INR self testing service Test date/time 11/10/17 2:45 pm INR 2.5

## 2017-11-13 NOTE — Telephone Encounter (Signed)
Covering for PCP, No changes to coumadin regimen.  Therapeutic.   Laroy Apple, MD [facility] 11/13/2017, 5:12 PM

## 2017-11-18 DIAGNOSIS — I482 Chronic atrial fibrillation: Secondary | ICD-10-CM | POA: Diagnosis not present

## 2017-11-18 DIAGNOSIS — Z7901 Long term (current) use of anticoagulants: Secondary | ICD-10-CM | POA: Diagnosis not present

## 2017-11-19 DIAGNOSIS — M545 Low back pain: Secondary | ICD-10-CM | POA: Diagnosis not present

## 2017-11-19 DIAGNOSIS — I4891 Unspecified atrial fibrillation: Secondary | ICD-10-CM | POA: Diagnosis not present

## 2017-11-22 ENCOUNTER — Other Ambulatory Visit: Payer: Self-pay | Admitting: Cardiology

## 2017-11-23 ENCOUNTER — Ambulatory Visit (HOSPITAL_COMMUNITY)
Admission: RE | Admit: 2017-11-23 | Discharge: 2017-11-23 | Disposition: A | Discharge status: Home / Self Care | Payer: Medicare Other | Source: Ambulatory Visit | Attending: Family Medicine | Admitting: Family Medicine

## 2017-11-23 DIAGNOSIS — K838 Other specified diseases of biliary tract: Secondary | ICD-10-CM | POA: Insufficient documentation

## 2017-11-23 DIAGNOSIS — I714 Abdominal aortic aneurysm, without rupture, unspecified: Secondary | ICD-10-CM

## 2017-11-23 DIAGNOSIS — M7130 Other bursal cyst, unspecified site: Secondary | ICD-10-CM | POA: Diagnosis not present

## 2017-11-23 NOTE — Telephone Encounter (Signed)
Rx request sent to pharmacy.  

## 2017-11-24 ENCOUNTER — Telehealth: Payer: Self-pay | Admitting: Family Medicine

## 2017-11-24 NOTE — Telephone Encounter (Signed)
Aware of US results. 

## 2017-12-01 ENCOUNTER — Telehealth: Payer: Self-pay | Admitting: *Deleted

## 2017-12-01 NOTE — Telephone Encounter (Signed)
Per Dr. Laurance Flatten continue current dosage

## 2017-12-01 NOTE — Telephone Encounter (Signed)
Fax received mdINR PT/INR self testing service Test date/time 11/25/17 12:05 INR 2.6

## 2017-12-04 ENCOUNTER — Other Ambulatory Visit: Payer: Self-pay | Admitting: Family Medicine

## 2017-12-04 ENCOUNTER — Telehealth: Payer: Self-pay | Admitting: *Deleted

## 2017-12-04 DIAGNOSIS — J449 Chronic obstructive pulmonary disease, unspecified: Secondary | ICD-10-CM | POA: Diagnosis not present

## 2017-12-04 NOTE — Telephone Encounter (Signed)
Continue same dose

## 2017-12-04 NOTE — Telephone Encounter (Signed)
Fax received mdINR PT/INR self testing service Test date/time 12/02/17 2:42 pm INR 2.8

## 2017-12-04 NOTE — Telephone Encounter (Signed)
Change to Friday morning since clinical pharmacy is here on that day

## 2017-12-04 NOTE — Telephone Encounter (Signed)
Pt aware to continue current dose. Call [place] as well

## 2017-12-07 DIAGNOSIS — M4716 Other spondylosis with myelopathy, lumbar region: Secondary | ICD-10-CM | POA: Diagnosis not present

## 2017-12-09 ENCOUNTER — Encounter: Payer: Self-pay | Admitting: Family Medicine

## 2017-12-09 LAB — PROTIME-INR: INR: 2.8 — AB (ref 0.9–1.1)

## 2017-12-16 ENCOUNTER — Other Ambulatory Visit: Payer: Self-pay | Admitting: Family Medicine

## 2017-12-16 DIAGNOSIS — I482 Chronic atrial fibrillation: Secondary | ICD-10-CM | POA: Diagnosis not present

## 2017-12-16 DIAGNOSIS — Z7901 Long term (current) use of anticoagulants: Secondary | ICD-10-CM | POA: Diagnosis not present

## 2017-12-20 ENCOUNTER — Telehealth: Payer: Self-pay | Admitting: Family Medicine

## 2017-12-20 ENCOUNTER — Other Ambulatory Visit: Payer: Self-pay | Admitting: *Deleted

## 2017-12-20 DIAGNOSIS — I4891 Unspecified atrial fibrillation: Secondary | ICD-10-CM | POA: Diagnosis not present

## 2017-12-20 DIAGNOSIS — R918 Other nonspecific abnormal finding of lung field: Secondary | ICD-10-CM

## 2017-12-20 DIAGNOSIS — M545 Low back pain: Secondary | ICD-10-CM | POA: Diagnosis not present

## 2017-12-20 DIAGNOSIS — I272 Pulmonary hypertension, unspecified: Secondary | ICD-10-CM

## 2017-12-20 DIAGNOSIS — F17200 Nicotine dependence, unspecified, uncomplicated: Secondary | ICD-10-CM

## 2017-12-20 DIAGNOSIS — J439 Emphysema, unspecified: Secondary | ICD-10-CM

## 2017-12-20 DIAGNOSIS — R0602 Shortness of breath: Secondary | ICD-10-CM

## 2017-12-20 NOTE — Telephone Encounter (Signed)
Melinda Collins aware - new rx / order sent

## 2017-12-26 ENCOUNTER — Telehealth: Payer: Self-pay | Admitting: *Deleted

## 2017-12-26 NOTE — Telephone Encounter (Signed)
Continue coumadin 5mg  daily except 7.5 mg on mondays and fridays. recheck INR  Around July 9

## 2017-12-26 NOTE — Telephone Encounter (Signed)
Fax received mdINR PT/INR self testing service Test date/time 12/16/17 2:37pm INR 2.6

## 2017-12-26 NOTE — Telephone Encounter (Signed)
Patient aware.

## 2018-01-02 ENCOUNTER — Other Ambulatory Visit: Payer: Self-pay | Admitting: *Deleted

## 2018-01-02 MED ORDER — DILTIAZEM HCL 30 MG PO TABS: 30.0000 mg | ORAL_TABLET | Freq: Two times a day (BID) | ORAL | 0 refills | Status: DC

## 2018-01-04 DIAGNOSIS — J449 Chronic obstructive pulmonary disease, unspecified: Secondary | ICD-10-CM | POA: Diagnosis not present

## 2018-01-08 ENCOUNTER — Telehealth: Payer: Self-pay | Admitting: *Deleted

## 2018-01-08 NOTE — Telephone Encounter (Signed)
Fax received mdINR PT/INR self testing service Test date/time 01/05/18 11:47 am INR 2.7

## 2018-01-08 NOTE — Telephone Encounter (Signed)
Continue current coumadin dose and recheck in 1 month

## 2018-01-08 NOTE — Telephone Encounter (Signed)
Aware. 

## 2018-01-11 ENCOUNTER — Telehealth: Payer: Self-pay | Admitting: *Deleted

## 2018-01-11 NOTE — Telephone Encounter (Signed)
Fax received mdINR PT/INR self testing service Test date/time 01/05/18 11:47 INR 2.7

## 2018-01-11 NOTE — Telephone Encounter (Signed)
Pt aware.

## 2018-01-11 NOTE — Telephone Encounter (Signed)
Continue current Coumadin treatment and repeat at present INR schedule

## 2018-01-15 ENCOUNTER — Other Ambulatory Visit: Payer: Self-pay | Admitting: Family Medicine

## 2018-01-15 ENCOUNTER — Telehealth: Payer: Self-pay | Admitting: Family Medicine

## 2018-01-16 ENCOUNTER — Telehealth: Payer: Self-pay | Admitting: Family Medicine

## 2018-01-16 NOTE — Telephone Encounter (Signed)
Faxed med list to [facility]

## 2018-01-16 NOTE — Telephone Encounter (Signed)
Spoke with [name] (daughter-n-law) and pharmacy  paxil is d/c  Continue cymbalta 30.

## 2018-01-17 ENCOUNTER — Other Ambulatory Visit: Payer: Self-pay | Admitting: Family Medicine

## 2018-01-19 DIAGNOSIS — I4891 Unspecified atrial fibrillation: Secondary | ICD-10-CM | POA: Diagnosis not present

## 2018-01-19 DIAGNOSIS — M545 Low back pain: Secondary | ICD-10-CM | POA: Diagnosis not present

## 2018-01-21 DIAGNOSIS — Z7901 Long term (current) use of anticoagulants: Secondary | ICD-10-CM | POA: Diagnosis not present

## 2018-01-21 DIAGNOSIS — I482 Chronic atrial fibrillation: Secondary | ICD-10-CM | POA: Diagnosis not present

## 2018-01-24 ENCOUNTER — Other Ambulatory Visit: Payer: Self-pay | Admitting: Family Medicine

## 2018-01-25 ENCOUNTER — Telehealth: Payer: Self-pay | Admitting: *Deleted

## 2018-01-25 NOTE — Telephone Encounter (Signed)
Fax received mdINR PT/INR self testing service Test date/time 01/13/18 1:30 pm INR 2.4

## 2018-01-25 NOTE — Telephone Encounter (Signed)
INR 2.4, INR at goal, continue current schedule of warfarin

## 2018-01-25 NOTE — Telephone Encounter (Signed)
Patient aware.

## 2018-01-30 ENCOUNTER — Telehealth: Payer: Self-pay | Admitting: *Deleted

## 2018-01-30 NOTE — Telephone Encounter (Signed)
Pt aware to continue current coumadin dose

## 2018-01-30 NOTE — Telephone Encounter (Signed)
At goal 2-3.  May continue current Coumadin regimen.  Follow up w/ PCP as scheduled.

## 2018-01-30 NOTE — Telephone Encounter (Signed)
Fax received mdINR PT/INR self testing service Test date/time 01/21/18 1:32 pm INR 2.6

## 2018-01-30 NOTE — Telephone Encounter (Signed)
Fax received mdINR PT/INR self testing service Test date/time 01/30/18 2:05 pm INR 3.2   TC to patient Per Dr. Lajuana Ripple Continue same dose if she is not having any bleeding and recheck in one week Pt aware

## 2018-01-31 ENCOUNTER — Telehealth: Payer: Self-pay | Admitting: Family Medicine

## 2018-01-31 ENCOUNTER — Other Ambulatory Visit: Payer: Self-pay | Admitting: Cardiology

## 2018-01-31 NOTE — Telephone Encounter (Signed)
Increase Cymbalta to 60 mg daily.  Have her take Nexium 40 mg 1 daily before breakfast and stop the over-the-counter proton pump inhibitor since it is not working please call the patient's daughter-in-law with this information

## 2018-02-01 MED ORDER — PREDNISONE 10 MG PO TABS: ORAL_TABLET | ORAL | 0 refills | Status: DC

## 2018-02-01 MED ORDER — ESOMEPRAZOLE MAGNESIUM 40 MG PO CPDR: 40.0000 mg | DELAYED_RELEASE_CAPSULE | Freq: Every day | ORAL | 3 refills | Status: DC

## 2018-02-01 MED ORDER — DULOXETINE HCL 60 MG PO CPEP: 60.0000 mg | ORAL_CAPSULE | Freq: Every day | ORAL | 3 refills | Status: DC

## 2018-02-01 NOTE — Telephone Encounter (Signed)
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 [facility] in [place] if approved.

## 2018-02-01 NOTE — Telephone Encounter (Signed)
Prednisone 10mg  taper for 8 days

## 2018-02-01 NOTE — Telephone Encounter (Signed)
meds all sent in and [facility] aware

## 2018-02-02 ENCOUNTER — Other Ambulatory Visit: Payer: Self-pay | Admitting: *Deleted

## 2018-02-03 DIAGNOSIS — J449 Chronic obstructive pulmonary disease, unspecified: Secondary | ICD-10-CM | POA: Diagnosis not present

## 2018-02-05 ENCOUNTER — Other Ambulatory Visit: Payer: Self-pay | Admitting: Family Medicine

## 2018-02-06 ENCOUNTER — Telehealth: Payer: Self-pay | Admitting: *Deleted

## 2018-02-06 MED ORDER — VITAMIN D 1000 UNITS PO TABS: 1000.0000 [IU] | ORAL_TABLET | Freq: Every day | ORAL | 11 refills | Status: DC

## 2018-02-06 MED ORDER — PSEUDOEPHEDRINE-GUAIFENESIN ER 60-600 MG PO TB12: 1.0000 | ORAL_TABLET | Freq: Two times a day (BID) | ORAL | 11 refills | Status: DC

## 2018-02-06 MED ORDER — VITAMIN B-12 1000 MCG PO TABS: 1000.0000 ug | ORAL_TABLET | Freq: Every day | ORAL | 11 refills | Status: DC

## 2018-02-06 NOTE — Telephone Encounter (Signed)
otc med orders sent to [facility]

## 2018-02-06 NOTE — Telephone Encounter (Signed)
Fax received [facility] pt starting bubble packing would like to include  Vit D 1000 1 po q am Vit B12 1000 1 po q am Mucinex D 600/60 1 po BID Will need prescriptions sent

## 2018-02-14 ENCOUNTER — Telehealth: Payer: Self-pay | Admitting: *Deleted

## 2018-02-14 NOTE — Telephone Encounter (Signed)
Fax received mdINR PT/INR self testing service Test date/time 02/03/18 1:21 pm INR 2.4

## 2018-02-14 NOTE — Telephone Encounter (Signed)
FYI: Melinda Collins states that [name] is doing great The increase of cymbalta is working well, she is living with them, she hasn;t smoked in 1 week and hasn't needed her oxygen near as much.

## 2018-02-14 NOTE — Telephone Encounter (Signed)
INR is perfect.  Patient should continue with her current treatment regimen and repeat the home INR again in a couple weeks

## 2018-02-14 NOTE — Telephone Encounter (Signed)
Carol aware of labs

## 2018-02-19 ENCOUNTER — Other Ambulatory Visit: Payer: Self-pay | Admitting: Family Medicine

## 2018-02-19 ENCOUNTER — Other Ambulatory Visit: Payer: Self-pay | Admitting: Cardiology

## 2018-02-19 ENCOUNTER — Telehealth: Payer: Self-pay | Admitting: *Deleted

## 2018-02-19 DIAGNOSIS — M545 Low back pain: Secondary | ICD-10-CM | POA: Diagnosis not present

## 2018-02-19 DIAGNOSIS — I4891 Unspecified atrial fibrillation: Secondary | ICD-10-CM | POA: Diagnosis not present

## 2018-02-19 NOTE — Telephone Encounter (Signed)
Fax received mdINR PT/INR self testing service Test date/time 02/13/18 10:50 INR 3.1

## 2018-02-19 NOTE — Telephone Encounter (Signed)
7.5 mg (5 mg x 1.5) every Fri; 5 mg (5 mg x 1) all other days  This is a new dosing and is decreased from what it was previously, recheck in 4 weeks

## 2018-02-19 NOTE — Telephone Encounter (Signed)
Pt says last time it was done it was 2.6, says she is to come today She will have her call before INR is done today

## 2018-02-21 NOTE — Telephone Encounter (Signed)
New reading is 2.5.

## 2018-02-21 NOTE — Telephone Encounter (Signed)
Continue current Coumadin treatment and do routine follow-ups for rechecking Coumadin either in 1 to 2 weeks or what ever we have been doing in the past.

## 2018-02-21 NOTE — Telephone Encounter (Signed)
Carol aware  

## 2018-02-21 NOTE — Telephone Encounter (Signed)
Dose changes were not given to patient when we talked on 02/19/18 since she said that her INR was going to be tested  Fax received mdINR PT/INR self testing service Test date/time 02/20/18 9:50 am INR 2.5

## 2018-02-22 ENCOUNTER — Other Ambulatory Visit: Payer: Self-pay

## 2018-02-22 NOTE — Patient Outreach (Signed)
[facility]) Care Management  02/22/2018  Natia Fahmy Alcalde 09/27/1940 [id number]   Medication Adherence call to Mrs. Roland Prine left a message for patient to call back patient is due on Atorvastatin 80 mg Mr. Kang is showing past due under [place].   [facility] Management Direct Dial [phone]  Fax [phone] [email or web address]

## 2018-02-25 DIAGNOSIS — Z7901 Long term (current) use of anticoagulants: Secondary | ICD-10-CM | POA: Diagnosis not present

## 2018-02-25 DIAGNOSIS — I482 Chronic atrial fibrillation: Secondary | ICD-10-CM | POA: Diagnosis not present

## 2018-02-28 ENCOUNTER — Other Ambulatory Visit: Payer: Self-pay | Admitting: Cardiology

## 2018-02-28 ENCOUNTER — Other Ambulatory Visit: Payer: Self-pay | Admitting: Family Medicine

## 2018-02-28 NOTE — Telephone Encounter (Signed)
Rx request sent to pharmacy.  

## 2018-03-01 ENCOUNTER — Other Ambulatory Visit: Payer: Self-pay | Admitting: Family Medicine

## 2018-03-01 ENCOUNTER — Other Ambulatory Visit: Payer: Self-pay

## 2018-03-01 MED ORDER — AZELASTINE HCL 0.1 % NA SOLN: 1.0000 | Freq: Two times a day (BID) | NASAL | 1 refills | Status: DC

## 2018-03-02 ENCOUNTER — Other Ambulatory Visit: Payer: Self-pay | Admitting: Family Medicine

## 2018-03-06 DIAGNOSIS — J449 Chronic obstructive pulmonary disease, unspecified: Secondary | ICD-10-CM | POA: Diagnosis not present

## 2018-03-07 ENCOUNTER — Telehealth: Payer: Self-pay | Admitting: *Deleted

## 2018-03-07 ENCOUNTER — Other Ambulatory Visit: Payer: Self-pay | Admitting: Family Medicine

## 2018-03-07 DIAGNOSIS — M4716 Other spondylosis with myelopathy, lumbar region: Secondary | ICD-10-CM | POA: Diagnosis not present

## 2018-03-07 NOTE — Telephone Encounter (Signed)
Continue Coumadin as doing with an INR at 2.9.

## 2018-03-07 NOTE — Telephone Encounter (Signed)
Both results received today  Fax received mdINR PT/INR self testing service Test date/time 02/25/18 10:18 am INR 3.1  Fax received mdINR PT/INR self testing service Test date/time 03/05/18 10:19 am INR 2.9

## 2018-03-08 NOTE — Telephone Encounter (Signed)
Carol aware  

## 2018-03-12 ENCOUNTER — Ambulatory Visit: Payer: Medicare Other | Admitting: Family Medicine

## 2018-03-13 ENCOUNTER — Telehealth: Payer: Self-pay | Admitting: *Deleted

## 2018-03-13 NOTE — Telephone Encounter (Signed)
Continue current treatment with Coumadin

## 2018-03-13 NOTE — Telephone Encounter (Signed)
Carol aware  

## 2018-03-13 NOTE — Telephone Encounter (Signed)
Fax received mdINR PT/INR self testing service Test date/time 03/11/18 11:42 am INR 2.7

## 2018-03-20 ENCOUNTER — Telehealth: Payer: Self-pay | Admitting: *Deleted

## 2018-03-20 NOTE — Telephone Encounter (Signed)
Pt aware to continue dosage and recheck in 1 wk Has appt on 03/26/18 w/ Dr. Laurance Flatten

## 2018-03-20 NOTE — Telephone Encounter (Signed)
Fax received mdINR PT/INR self testing service Test date/time 03/19/18 10:34 am INR 3.3

## 2018-03-20 NOTE — Telephone Encounter (Signed)
She is supratherapeutic.  Previously at goal on current regimen.  No changes if not bleeding.  Repeat in 1 week.  If persistently elevated or has bleeding episode, will need to change dosage.

## 2018-03-21 ENCOUNTER — Other Ambulatory Visit: Payer: Self-pay | Admitting: Cardiology

## 2018-03-25 DIAGNOSIS — I482 Chronic atrial fibrillation: Secondary | ICD-10-CM | POA: Diagnosis not present

## 2018-03-25 DIAGNOSIS — Z7901 Long term (current) use of anticoagulants: Secondary | ICD-10-CM | POA: Diagnosis not present

## 2018-03-26 ENCOUNTER — Encounter: Payer: Self-pay | Admitting: Family Medicine

## 2018-03-26 ENCOUNTER — Ambulatory Visit (INDEPENDENT_AMBULATORY_CARE_PROVIDER_SITE_OTHER): Payer: Medicare Other | Admitting: Family Medicine

## 2018-03-26 VITALS — BP 121/78 | HR 66 | Temp 97.6°F | Ht 64.0 in | Wt 150.0 lb

## 2018-03-26 DIAGNOSIS — I700 Atherosclerosis of aorta: Secondary | ICD-10-CM

## 2018-03-26 DIAGNOSIS — I482 Chronic atrial fibrillation, unspecified: Secondary | ICD-10-CM

## 2018-03-26 DIAGNOSIS — I4821 Permanent atrial fibrillation: Secondary | ICD-10-CM

## 2018-03-26 DIAGNOSIS — I714 Abdominal aortic aneurysm, without rupture, unspecified: Secondary | ICD-10-CM

## 2018-03-26 DIAGNOSIS — I10 Essential (primary) hypertension: Secondary | ICD-10-CM

## 2018-03-26 DIAGNOSIS — E7800 Pure hypercholesterolemia, unspecified: Secondary | ICD-10-CM | POA: Diagnosis not present

## 2018-03-26 DIAGNOSIS — I272 Pulmonary hypertension, unspecified: Secondary | ICD-10-CM

## 2018-03-26 DIAGNOSIS — E559 Vitamin D deficiency, unspecified: Secondary | ICD-10-CM | POA: Diagnosis not present

## 2018-03-26 DIAGNOSIS — J439 Emphysema, unspecified: Secondary | ICD-10-CM

## 2018-03-26 DIAGNOSIS — K047 Periapical abscess without sinus: Secondary | ICD-10-CM

## 2018-03-26 MED ORDER — CEPHALEXIN 500 MG PO CAPS: 500.0000 mg | ORAL_CAPSULE | Freq: Three times a day (TID) | ORAL | 0 refills | Status: DC

## 2018-03-26 NOTE — Progress Notes (Signed)
Subjective:    Patient ID: Melinda Collins, female    DOB: 02/17/1941, 77 y.o.   MRN: [id number]  HPI Pt here for follow up and management of chronic medical problems which includes hypertension and hyperlipidemia. She is taking medication regularly.  The patient comes with her daughter-in-law [name] to the visit today.  She complains that a tooth on the left side is broken and there is some swelling.  She has her ongoing back pain which is chronic for which she has been followed by the neurosurgeon.  She will be given an FOBT to return and will get lab work during the visit today.  The patient is doing well and is proud of herself for losing weight and getting her new upper plate in.  She still is working on the lower teeth and needs to have some extracted because the ones on the left side or abscess.  She has plans to do this very soon.  Her daughter-in-law has been great with looking after her.  Today she denies any chest pain pressure tightness or shortness of breath anymore than usual.  She has no problems with her intestinal tract or complaints with nausea vomiting diarrhea blood in the stool or black tarry bowel movements.  She is passing her water well and trying to drink plenty of fluids.   Patient Active Problem List   Diagnosis Date Noted  . Pulmonary nodules 02/08/2017  . Aortic atherosclerosis ([place]) 04/22/2016  . Permanent atrial fibrillation ([place]) 10/16/2014  . Metabolic syndrome 98/26/4158  . History of uterine cancer 12/03/2013  . Chronic low back pain 05/23/2013  . High risk medication use 05/23/2013  . Swelling of limb 05/03/2013  . Hypertension 05/17/2011  . Hyperlipidemia 02/10/2009  . TOBACCO ABUSE 02/10/2009  . 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 Atrial fibrillation ([place]) 02/10/2009   Outpatient Encounter Medications as of 03/26/2018  Medication Sig  . ALLERGY 10 MG tablet TAKE ONE TABLET BY MOUTH DAILY. (,EVENING)  . atorvastatin (LIPITOR) 80 MG tablet TAKE ONE  TABLET BY MOUTH DAILY.  Marland Kitchen azelastine (ASTELIN) 0.1 % nasal spray Place 1 spray into both nostrils 2 (two) times daily. Use in each nostril as directed  . BREO ELLIPTA 100-25 MCG/INH AEPB Take 1 puff by mouth daily.  . cholecalciferol (VITAMIN D) 1000 units tablet Take 1 tablet (1,000 Units total) by mouth daily with breakfast.  . diltiazem (CARDIZEM) 30 MG tablet Take 1 tablet (30 mg total) by mouth 2 (two) times daily. NEED OV.  . DULoxetine (CYMBALTA) 60 MG capsule Take 1 capsule (60 mg total) by mouth daily.  Marland Kitchen esomeprazole (NEXIUM) 40 MG capsule Take 1 capsule (40 mg total) by mouth daily at 12 noon.  . fluticasone (FLONASE) 50 MCG/ACT nasal spray USE 1 SPRAY IN EACH NOSTRIL AT BEDTIME.  Marland Kitchen guaiFENesin (MUCINEX) 600 MG 12 hr tablet Take 1 tablet (600 mg total) by mouth 2 (two) times daily as needed.  . INCRUSE ELLIPTA 62.5 MCG/INH AEPB Take 1 puff by mouth daily.  . Multiple Vitamins-Minerals (CENTRUM SILVER PO) Take 1 tablet by mouth daily.    . NON FORMULARY Oxygen 3L/min   . oxyCODONE ER (XTAMPZA ER) 13.5 MG C12A Take 1 tablet by mouth 2 (two) times daily.  Marland Kitchen oxyCODONE-acetaminophen (PERCOCET) 5-325 MG per tablet Take 2 tablets by mouth every 8 (eight) hours as needed.    . potassium chloride (K-DUR,KLOR-CON) 10 MEQ tablet TAKE ONE TABLET BY MOUTH DAILY. (MORNING)  . PROAIR HFA 108 (90 Base) MCG/ACT  inhaler INHALE 1-2 PUFFS EVERY 4 TO 6 HOURS AS NEEDED FOR SHORTNESS OF BREATH  . pseudoephedrine-guaifenesin (MUCINEX D) 60-600 MG 12 hr tablet Take 1 tablet by mouth every 12 (twelve) hours.  . torsemide (DEMADEX) 20 MG tablet TAKE 1 AND 1/2 TABLETS BY MOUTH ONCE DAILY. (MORNING)  . vitamin B-12 (CYANOCOBALAMIN) 1000 MCG tablet Take 1 tablet (1,000 mcg total) by mouth daily with breakfast.  . warfarin (COUMADIN) 5 MG tablet TAKE 1 TO 2 TABLETS BY MOUTH AS DIRECTED BY ANTICOAGULATION CLINIC.  . [DISCONTINUED] Cholecalciferol (VITAMIN D-3) 1000 units CAPS Take 1 capsule by mouth daily.  .  [DISCONTINUED] cyclobenzaprine (FLEXERIL) 10 MG tablet Take 10 mg by mouth 3 (three) times daily.  . [DISCONTINUED] fentaNYL (DURAGESIC - DOSED MCG/HR) 50 MCG/HR Place 50 mcg onto the skin every 3 (three) days.  . [DISCONTINUED] predniSONE (DELTASONE) 10 MG tablet Take 1 tab QID x 2 days, then 1 tab TID x 2 days, then 1 tab BID x 2 days, then 1 tab QD x 2 days.   No facility-administered encounter medications on file as of 03/26/2018.      Review of Systems  Constitutional: Negative.   HENT: Negative.        Left side tooth pain - jaw swollen   Eyes: Negative.   Respiratory: Negative.   Cardiovascular: Negative.   Gastrointestinal: Negative.   Endocrine: Negative.   Genitourinary: Negative.   Musculoskeletal: Positive for back pain (on-going).  Skin: Negative.   Allergic/Immunologic: Negative.   Neurological: Negative.   Hematological: Negative.   Psychiatric/Behavioral: Negative.        Objective:   Physical Exam  Constitutional: She is oriented to person, place, and time. She appears well-developed and well-nourished. No distress.  Patient is pleasant and alert and looks nice and feels better because she feels like she looks better.  And she does.  The teeth have helped tremendously and as soon as she can get her lower teeth out and a new plate there I feel certain will make her feel even better.  HENT:  Head: Normocephalic and atraumatic.  Right Ear: External ear normal.  Left Ear: External ear normal.  Nose: Nose normal.  Mouth/Throat: No oropharyngeal exudate.  The lower teeth on the left appear to be broken off and inflamed and we will put her on an antibiotic for this until she sees the dentist.  Eyes: Pupils are equal, round, and reactive to light. Conjunctivae and EOM are normal. Right eye exhibits no discharge. Left eye exhibits no discharge. No scleral icterus.  Neck: Normal range of motion. Neck supple. No thyromegaly present.  No bruits thyromegaly or anterior  cervical adenopathy  Cardiovascular: Normal rate and normal heart sounds.  No murmur heard. Heart remains irregular irregular at about 84/min  Pulmonary/Chest: Effort normal. No respiratory distress. She has wheezes. She has no rales.  Patient's lungs sound better but with coughing there is still congested and somewhat tight but not as bad as in the past.  The smoking cessation has certainly helped with her airway movement.  Abdominal: Soft. Bowel sounds are normal. She exhibits no mass. There is no tenderness.  Musculoskeletal: She exhibits tenderness. She exhibits no edema.  Ongoing problems with back pain osteoarthritis osteoporosis and a lucent appliance from previous surgery which the neurosurgeon does not want to fix because of increased risk and primarily because of her osteoporosis.  Lymphadenopathy:    She has no cervical adenopathy.  Neurological: She is alert and oriented to person,  place, and time. She has normal reflexes.  Skin: Skin is warm and dry. No rash noted.  Psychiatric: She has a normal mood and affect. Her behavior is normal. Judgment and thought content normal.  The patient's mood affect and behavior are all normal for her and may be actually improved somewhat since I saw her last.  She was applauded for stopping smoking.  Nursing note and vitals reviewed.  BP 121/78 (BP Location: Right Arm)   Pulse 66   Temp 97.6 F (36.4 C) (Oral)   Ht '5\' 4"'  (1.626 m)   Wt 150 lb (68 kg)   BMI 25.75 kg/m         Assessment & Plan:  1. Pure hypercholesterolemia -Continue with as aggressive therapeutic lifestyle changes as possible to keep cholesterol under control and continue with atorvastatin. - CBC with Differential/Platelet - Lipid panel  2. Essential hypertension -The blood pressure is good today and she will continue with current treatment - BMP8+EGFR - CBC with Differential/Platelet - Hepatic function panel  3. Vitamin D deficiency -Continue with vitamin D  replacement - CBC with Differential/Platelet - VITAMIN D 25 Hydroxy (Vit-D Deficiency, Fractures)  4. Permanent atrial fibrillation (HCC) -Continue follow-up with cardiology and with Coumadin and weekly pro time checks - CBC with Differential/Platelet  5. Aortic atherosclerosis (H[facility]) -Continue atorvastatin and as aggressive therapeutic lifestyle changes as possible including diet and exercise - CBC with Differential/Platelet - Lipid panel  6. Pulmonary emphysema, unspecified emphysema type ([place]) -Continue with smoking cessation, use inhalers regularly, drink plenty of fluids and continue with Mucinex to loosen congestion -Continue to follow-up with pulmonology as planned  7. Chronic pulmonary hypertension (HCC) -Continue with current treatment and with inhalers and staying away from cigarettes.  8. Abdominal aortic aneurysm (AAA) without rupture ([place]) -Recent abdominal ultrasound in May of this year only showed minimal enlargement of the aortic aneurysm and we will probably most likely plan to repeat this again next summer.  9. Chronic atrial fibrillation ([place]) -Continue with Coumadin and follow-up with the cardiologist as planned  10.  Tooth infection -Take Keflex as directed and see Dentist  Meds ordered this encounter  Medications  . cephALEXin (KEFLEX) 500 MG capsule    Sig: Take 1 capsule (500 mg total) by mouth 3 (three) times daily.    Dispense:  30 capsule    Refill:  0   Patient Instructions                       Medicare Annual Wellness Visit  [place] and the medical providers at [facility] strive to bring you the best medical care.  In doing so we not only want to address your current medical conditions and concerns but also to detect new conditions early and prevent illness, disease and health-related problems.    Medicare offers a yearly Wellness Visit which allows our clinical staff to assess your need for preventative services  including immunizations, lifestyle education, counseling to decrease risk of preventable diseases and screening for fall risk and other medical concerns.    This visit is provided free of charge (no copay) for all Medicare recipients. The clinical pharmacists at [facility] have begun to conduct these Wellness Visits which will also include a thorough review of all your medications.    As you primary medical provider recommend that you make an appointment for your Annual Wellness Visit if you have not done so already this year.  You may set up this appointment before you leave today or you may call back ([phone]) and schedule an appointment.  Please make sure when you call that you mention that you are scheduling your Annual Wellness Visit with the clinical pharmacist so that the appointment may be made for the proper length of time.     Continue current medications. Continue good therapeutic lifestyle changes which include good diet and exercise. Fall precautions discussed with patient. If an FOBT was given today- please return it to our front desk. If you are over 68 years old - you may need Prevnar 32 or the adult Pneumonia vaccine.  **Flu shots are available--- please call and schedule a FLU-CLINIC appointment**  After your visit with [place] today you will receive a survey in the mail or online from [employer] regarding your care with [place]. Please take a moment to fill this out. Your feedback is very important to [place] as you can help [place] better understand your patient needs as well as improve your experience and satisfaction. WE CARE ABOUT YOU!!!   Continue weekly protimes Take antibiotic as directed and follow-up with dentist as planned Drink plenty of fluids and stay well-hydrated Continue to be careful to not put yourself at risk for falling Follow-up with pulmonology as planned Listen to your daughter-in-law she is been an extremely important asset for you to  have.  Arrie Senate MD

## 2018-03-26 NOTE — Patient Instructions (Addendum)
Medicare Annual Wellness Visit  [facility] and the medical providers at [facility] strive to bring you the best medical care.  In doing so we not only want to address your current medical conditions and concerns but also to detect new conditions early and prevent illness, disease and health-related problems.    Medicare offers a yearly Wellness Visit which allows our clinical staff to assess your need for preventative services including immunizations, lifestyle education, counseling to decrease risk of preventable diseases and screening for fall risk and other medical concerns.    This visit is provided free of charge (no copay) for all Medicare recipients. The clinical pharmacists at [facility] have begun to conduct these Wellness Visits which will also include a thorough review of all your medications.    As you primary medical provider recommend that you make an appointment for your Annual Wellness Visit if you have not done so already this year.  You may set up this appointment before you leave today or you may call back ([phone]) and schedule an appointment.  Please make sure when you call that you mention that you are scheduling your Annual Wellness Visit with the clinical pharmacist so that the appointment may be made for the proper length of time.     Continue current medications. Continue good therapeutic lifestyle changes which include good diet and exercise. Fall precautions discussed with patient. If an FOBT was given today- please return it to our front desk. If you are over 40 years old - you may need Prevnar 60 or the adult Pneumonia vaccine.  **Flu shots are available--- please call and schedule a FLU-CLINIC appointment**  After your visit with [place] today you will receive a survey in the mail or online from [employer] regarding your care with [place]. Please take a moment to fill this out. Your feedback is very  important to [place] as you can help [place] better understand your patient needs as well as improve your experience and satisfaction. WE CARE ABOUT YOU!!!   Continue weekly protimes Take antibiotic as directed and follow-up with dentist as planned Drink plenty of fluids and stay well-hydrated Continue to be careful to not put yourself at risk for falling Follow-up with pulmonology as planned Listen to your daughter-in-law she is been an extremely important asset for you to have.

## 2018-03-27 ENCOUNTER — Telehealth: Payer: Self-pay | Admitting: *Deleted

## 2018-03-27 LAB — LIPID PANEL
CHOL/HDL RATIO: 3 ratio (ref 0.0–4.4)
CHOLESTEROL TOTAL: 167 mg/dL (ref 100–199)
HDL: 55 mg/dL (ref >39)
LDL Calculated: 74 mg/dL (ref 0–99)
Triglycerides: 188 mg/dL — ABNORMAL HIGH (ref 0–149)
VLDL Cholesterol Cal: 38 mg/dL (ref 5–40)

## 2018-03-27 LAB — BMP8+EGFR
BUN/Creatinine Ratio: 16 (ref 12–28)
BUN: 28 mg/dL — ABNORMAL HIGH (ref 8–27)
CO2: 19 mmol/L — AB (ref 20–29)
Calcium: 8.7 mg/dL (ref 8.7–10.3)
Chloride: 108 mmol/L — ABNORMAL HIGH (ref 96–106)
Creatinine, Ser: 1.74 mg/dL — ABNORMAL HIGH (ref 0.57–1.00)
GFR calc Af Amer: 33 mL/min/{1.73_m2} — ABNORMAL LOW (ref >59)
GFR, EST NON AFRICAN AMERICAN: 28 mL/min/{1.73_m2} — AB (ref >59)
Glucose: 145 mg/dL — ABNORMAL HIGH (ref 65–99)
POTASSIUM: 4.1 mmol/L (ref 3.5–5.2)
SODIUM: 143 mmol/L (ref 134–144)

## 2018-03-27 LAB — CBC WITH DIFFERENTIAL/PLATELET
BASOS: 1 %
Basophils Absolute: 0.1 10*3/uL (ref 0.0–0.2)
EOS (ABSOLUTE): 0.3 10*3/uL (ref 0.0–0.4)
Eos: 4 %
Hematocrit: 43.2 % (ref 34.0–46.6)
Hemoglobin: 13.8 g/dL (ref 11.1–15.9)
Immature Grans (Abs): 0 10*3/uL (ref 0.0–0.1)
Immature Granulocytes: 0 %
Lymphocytes Absolute: 1.7 10*3/uL (ref 0.7–3.1)
Lymphs: 23 %
MCH: 28.6 pg (ref 26.6–33.0)
MCHC: 31.9 g/dL (ref 31.5–35.7)
MCV: 90 fL (ref 79–97)
MONOS ABS: 0.5 10*3/uL (ref 0.1–0.9)
Monocytes: 7 %
NEUTROS ABS: 4.9 10*3/uL (ref 1.4–7.0)
Neutrophils: 65 %
PLATELETS: 199 10*3/uL (ref 150–450)
RBC: 4.82 x10E6/uL (ref 3.77–5.28)
RDW: 17.4 % — AB (ref 12.3–15.4)
WBC: 7.5 10*3/uL (ref 3.4–10.8)

## 2018-03-27 LAB — VITAMIN D 25 HYDROXY (VIT D DEFICIENCY, FRACTURES): Vit D, 25-Hydroxy: 28.4 ng/mL — ABNORMAL LOW (ref 30.0–100.0)

## 2018-03-27 LAB — HEPATIC FUNCTION PANEL
ALBUMIN: 3.9 g/dL (ref 3.5–4.8)
ALT: 15 IU/L (ref 0–32)
AST: 15 IU/L (ref 0–40)
Alkaline Phosphatase: 121 IU/L — ABNORMAL HIGH (ref 39–117)
BILIRUBIN TOTAL: 0.4 mg/dL (ref 0.0–1.2)
BILIRUBIN, DIRECT: 0.12 mg/dL (ref 0.00–0.40)
Total Protein: 6 g/dL (ref 6.0–8.5)

## 2018-03-27 NOTE — Telephone Encounter (Signed)
Melinda Collins held one dose and then resumed regular treatment = she will rck in 1 week at home.

## 2018-03-27 NOTE — Telephone Encounter (Signed)
Fax received mdINR PT/INR self testing service Test date/time 03/25/18 10:22 INR 3.5

## 2018-03-29 ENCOUNTER — Other Ambulatory Visit: Payer: Self-pay | Admitting: Family Medicine

## 2018-04-06 ENCOUNTER — Telehealth: Payer: Self-pay | Admitting: *Deleted

## 2018-04-06 DIAGNOSIS — J449 Chronic obstructive pulmonary disease, unspecified: Secondary | ICD-10-CM | POA: Diagnosis not present

## 2018-04-06 NOTE — Telephone Encounter (Signed)
Carol aware  

## 2018-04-06 NOTE — Telephone Encounter (Signed)
Fax received mdINR PT/INR self testing service Test date/time 04/04/18 11:49 am INR 2.5

## 2018-04-06 NOTE — Telephone Encounter (Signed)
Continue current dose of Coumadin and recheck again in 1 week

## 2018-04-16 ENCOUNTER — Telehealth: Payer: Self-pay | Admitting: *Deleted

## 2018-04-16 NOTE — Telephone Encounter (Signed)
Fax received mdINR PT/INR self testing service Test date/time 04/11/18 1:19 pm INR 2.7

## 2018-04-16 NOTE — Telephone Encounter (Signed)
LMOVM no changes

## 2018-04-16 NOTE — Telephone Encounter (Signed)
INR therapeutic. No changes. 

## 2018-04-18 ENCOUNTER — Other Ambulatory Visit: Payer: Self-pay | Admitting: Family Medicine

## 2018-04-24 ENCOUNTER — Telehealth: Payer: Self-pay | Admitting: Family Medicine

## 2018-04-24 ENCOUNTER — Other Ambulatory Visit: Payer: Self-pay | Admitting: Family Medicine

## 2018-04-24 MED ORDER — CEPHALEXIN 500 MG PO CAPS: 500.0000 mg | ORAL_CAPSULE | Freq: Three times a day (TID) | ORAL | 0 refills | Status: DC

## 2018-04-24 NOTE — Telephone Encounter (Signed)
Please check and see what we use recently that help to clear this up.  Probably Keflex 500 mg 3 times a day for 10 days would work for this.  If this does not help it get better she needs to let us know.  Please call her daughter-in-law with this information.

## 2018-04-24 NOTE — Telephone Encounter (Signed)
Melinda Collins aware of keflex  Going to dentist next wed

## 2018-04-26 ENCOUNTER — Telehealth: Payer: Self-pay | Admitting: *Deleted

## 2018-04-26 NOTE — Telephone Encounter (Signed)
Fax received mdINR PT/INR self testing service Test date/time 04/21/18 11:37 am INR 2.7

## 2018-04-26 NOTE — Telephone Encounter (Signed)
Continue coumadin 5mg  daily except 7.5mg  on Friday- recheck in 1 month

## 2018-04-27 NOTE — Telephone Encounter (Signed)
Aware of changes, [name] who does her INRs will still do weekly as she has been. Also gave her Ashly with [employer]'s phone number he had mentioned an APP for phone for sooner reporting since patient's INR is usually drawn on the weekends

## 2018-04-30 DIAGNOSIS — Z7901 Long term (current) use of anticoagulants: Secondary | ICD-10-CM | POA: Diagnosis not present

## 2018-05-02 ENCOUNTER — Telehealth: Payer: Self-pay | Admitting: *Deleted

## 2018-05-02 ENCOUNTER — Other Ambulatory Visit: Payer: Self-pay | Admitting: *Deleted

## 2018-05-02 DIAGNOSIS — M545 Low back pain, unspecified: Secondary | ICD-10-CM

## 2018-05-02 DIAGNOSIS — M25552 Pain in left hip: Secondary | ICD-10-CM

## 2018-05-02 NOTE — Telephone Encounter (Signed)
-  Continue current treatment regimen

## 2018-05-02 NOTE — Telephone Encounter (Signed)
Fax received mdINR PT/INR self testing service Test date/time 04/30/18 10:50 am INR 2.7

## 2018-05-02 NOTE — Telephone Encounter (Signed)
Carol aware  

## 2018-05-03 ENCOUNTER — Other Ambulatory Visit: Payer: Self-pay | Admitting: Family Medicine

## 2018-05-06 DIAGNOSIS — J449 Chronic obstructive pulmonary disease, unspecified: Secondary | ICD-10-CM | POA: Diagnosis not present

## 2018-05-11 ENCOUNTER — Telehealth: Payer: Self-pay | Admitting: *Deleted

## 2018-05-11 NOTE — Telephone Encounter (Signed)
Carol aware  

## 2018-05-11 NOTE — Telephone Encounter (Signed)
Fax received mdINR PT/INR self testing service Test date/time 05/09/18 11:06 INR 3.0

## 2018-05-11 NOTE — Telephone Encounter (Signed)
Continue current coumadin dose and recheck in 4 weeks

## 2018-05-17 ENCOUNTER — Telehealth: Payer: Self-pay | Admitting: *Deleted

## 2018-05-17 NOTE — Telephone Encounter (Signed)
Melinda Collins notified to continue same dose

## 2018-05-17 NOTE — Telephone Encounter (Signed)
Fax received mdINR PT/INR self testing service Test date/time 05/16/18 3:58 pm INR 2.8

## 2018-05-17 NOTE — Telephone Encounter (Signed)
INR at goal.

## 2018-05-29 ENCOUNTER — Telehealth: Payer: Self-pay | Admitting: *Deleted

## 2018-05-29 NOTE — Telephone Encounter (Signed)
Fax received mdINR PT/INR self testing service Test date/time 05/24/18 11:42 INR 3.2

## 2018-05-29 NOTE — Telephone Encounter (Signed)
INR 3.2, INR slightly thin,   Hold todays dose and then change to 5mg  daily - recheck in 2 weeks

## 2018-05-29 NOTE — Telephone Encounter (Signed)
Aware of dose change.

## 2018-05-31 ENCOUNTER — Other Ambulatory Visit: Payer: Self-pay | Admitting: Family Medicine

## 2018-05-31 ENCOUNTER — Telehealth: Payer: Self-pay | Admitting: Family Medicine

## 2018-05-31 DIAGNOSIS — I482 Chronic atrial fibrillation, unspecified: Secondary | ICD-10-CM | POA: Diagnosis not present

## 2018-05-31 DIAGNOSIS — Z7901 Long term (current) use of anticoagulants: Secondary | ICD-10-CM | POA: Diagnosis not present

## 2018-06-01 ENCOUNTER — Telehealth: Payer: Self-pay | Admitting: *Deleted

## 2018-06-01 NOTE — Telephone Encounter (Signed)
Carol aware  

## 2018-06-01 NOTE — Telephone Encounter (Signed)
Continue current Coumadin treatment and recheck INR in 1 week

## 2018-06-01 NOTE — Telephone Encounter (Signed)
Fax received mdINR PT/INR self testing service Test date/time 05/31/18 12:56 INR 2.8

## 2018-06-04 ENCOUNTER — Other Ambulatory Visit: Payer: Self-pay | Admitting: Family Medicine

## 2018-06-06 DIAGNOSIS — J449 Chronic obstructive pulmonary disease, unspecified: Secondary | ICD-10-CM | POA: Diagnosis not present

## 2018-06-11 DIAGNOSIS — M4716 Other spondylosis with myelopathy, lumbar region: Secondary | ICD-10-CM | POA: Diagnosis not present

## 2018-06-12 ENCOUNTER — Telehealth: Payer: Self-pay | Admitting: *Deleted

## 2018-06-12 NOTE — Telephone Encounter (Signed)
Fax received mdINR PT/INR self testing service Test date/time 06/07/18 1:14 pm INR 2.8

## 2018-06-12 NOTE — Telephone Encounter (Signed)
Aware and verbalizes understanding.  

## 2018-06-12 NOTE — Telephone Encounter (Signed)
Continue current dose of 5mg  daily except 7.5 mg on monday

## 2018-06-13 ENCOUNTER — Other Ambulatory Visit: Payer: Self-pay | Admitting: Family Medicine

## 2018-06-18 ENCOUNTER — Telehealth: Payer: Self-pay | Admitting: *Deleted

## 2018-06-18 NOTE — Telephone Encounter (Signed)
Fax received mdINR PT/INR self testing service Test date/time 06/14/18 10:33 am INR 2.9

## 2018-06-25 ENCOUNTER — Telehealth: Payer: Self-pay

## 2018-06-25 NOTE — Telephone Encounter (Signed)
MD INR 06/21/18 2.4    DWM PCP

## 2018-06-26 NOTE — Telephone Encounter (Signed)
She is therapeutic.  Ok to check in 1 week

## 2018-06-28 NOTE — Telephone Encounter (Signed)
Pt aware to recheck INR wk from last time, lady that does this for her may not get to it today, maybe Saturday.

## 2018-06-29 DIAGNOSIS — I4821 Permanent atrial fibrillation: Secondary | ICD-10-CM | POA: Diagnosis not present

## 2018-06-29 DIAGNOSIS — Z7901 Long term (current) use of anticoagulants: Secondary | ICD-10-CM | POA: Diagnosis not present

## 2018-07-02 ENCOUNTER — Telehealth: Payer: Self-pay | Admitting: *Deleted

## 2018-07-02 NOTE — Telephone Encounter (Signed)
I do not see the INR results that were to be routed

## 2018-07-02 NOTE — Telephone Encounter (Signed)
Pt had 2 INR done on 06/29/18.  1st reading was 4.1 2nd reading was 3.1  I have LM for [name] (daughter in Software engineer ) to call me back.  I need to know if this has been reapeted since [place] 12/13, if she adjusted it, or if we need to.??  -jhb

## 2018-07-02 NOTE — Telephone Encounter (Signed)
Closing encounter, call was received on INR result, before fax was received

## 2018-07-06 DIAGNOSIS — J449 Chronic obstructive pulmonary disease, unspecified: Secondary | ICD-10-CM | POA: Diagnosis not present

## 2018-07-19 ENCOUNTER — Telehealth: Payer: Self-pay | Admitting: *Deleted

## 2018-07-19 NOTE — Telephone Encounter (Signed)
Fax received mdINR PT/INR self testing service Test date/time 07/06/18 10:27 am INR 3.1

## 2018-07-19 NOTE — Telephone Encounter (Signed)
Normal range- continue current dose

## 2018-07-19 NOTE — Telephone Encounter (Signed)
Needs to be repeated ASAP- has been to long to address now

## 2018-07-19 NOTE — Telephone Encounter (Signed)
Fax received mdINR PT/INR self testing service Test date/time 07/12/18 10:31 am INR 2.7

## 2018-07-19 NOTE — Telephone Encounter (Signed)
Pt aware.

## 2018-07-20 ENCOUNTER — Telehealth: Payer: Self-pay | Admitting: *Deleted

## 2018-07-20 NOTE — Telephone Encounter (Signed)
contiune current dose and recheck in 1 month

## 2018-07-20 NOTE — Telephone Encounter (Signed)
Fax received mdINR PT/INR self testing service Test date/time 07/19/18 11:43 am INR 2.4

## 2018-07-24 ENCOUNTER — Other Ambulatory Visit: Payer: Self-pay | Admitting: Family Medicine

## 2018-07-25 ENCOUNTER — Other Ambulatory Visit: Payer: Self-pay | Admitting: Family Medicine

## 2018-07-27 ENCOUNTER — Telehealth: Payer: Self-pay | Admitting: *Deleted

## 2018-07-27 DIAGNOSIS — Z7901 Long term (current) use of anticoagulants: Secondary | ICD-10-CM | POA: Diagnosis not present

## 2018-07-27 DIAGNOSIS — I4821 Permanent atrial fibrillation: Secondary | ICD-10-CM | POA: Diagnosis not present

## 2018-07-27 MED ORDER — CEFDINIR 300 MG PO CAPS: 300.0000 mg | ORAL_CAPSULE | Freq: Two times a day (BID) | ORAL | 0 refills | Status: DC

## 2018-07-27 NOTE — Telephone Encounter (Signed)
Pt aware.

## 2018-07-27 NOTE — Telephone Encounter (Signed)
Please have patient take Mucinex maximum strength, 1 twice daily with water and use inhalers regularly and take Omnicef 300 twice daily with food for 10 days until completed.  If she gets worse she should get back in touch with this office.

## 2018-07-30 ENCOUNTER — Ambulatory Visit: Payer: Medicare Other | Admitting: Family Medicine

## 2018-07-31 ENCOUNTER — Telehealth: Payer: Self-pay | Admitting: *Deleted

## 2018-07-31 NOTE — Telephone Encounter (Signed)
Left detailed message for [name] / and pt

## 2018-07-31 NOTE — Telephone Encounter (Signed)
It appears that the most recent INR was within the normal range even though the earlier on was slightly on the thin side.  She should continue with her current dose of Coumadin and repeat the INR in 1 week.

## 2018-07-31 NOTE — Telephone Encounter (Signed)
Fax received mdINR PT/INR self testing service Test date/time 07/27/18 12:13 pm INR 3.0  Fax received mdINR PT/INR self testing service Test date/time 07/30/18 12:14 pm INR 2.3

## 2018-08-06 DIAGNOSIS — J449 Chronic obstructive pulmonary disease, unspecified: Secondary | ICD-10-CM | POA: Diagnosis not present

## 2018-08-07 ENCOUNTER — Telehealth: Payer: Self-pay | Admitting: Family Medicine

## 2018-08-07 NOTE — Telephone Encounter (Signed)
Patient daughter in law aware you are out of the office today. She would like a cbc with diff and a cmp. I offered appointment today but she would just like labs

## 2018-08-07 NOTE — Telephone Encounter (Signed)
These get CBC and BMP as requested and question whether she may need a rapid flu test or not.

## 2018-08-08 ENCOUNTER — Encounter: Payer: Self-pay | Admitting: Family Medicine

## 2018-08-08 ENCOUNTER — Other Ambulatory Visit: Payer: Self-pay | Admitting: *Deleted

## 2018-08-08 DIAGNOSIS — R6889 Other general symptoms and signs: Secondary | ICD-10-CM

## 2018-08-08 DIAGNOSIS — I509 Heart failure, unspecified: Secondary | ICD-10-CM | POA: Diagnosis not present

## 2018-08-08 NOTE — Telephone Encounter (Signed)
Aware. May come for lab work.

## 2018-08-08 NOTE — Telephone Encounter (Signed)
Aware.  Orders are in.

## 2018-08-09 ENCOUNTER — Telehealth: Payer: Self-pay | Admitting: *Deleted

## 2018-08-09 NOTE — Telephone Encounter (Signed)
Continue Coumadin at current dosing regimen

## 2018-08-09 NOTE — Telephone Encounter (Signed)
Fax received mdINR PT/INR self testing service Test date/time 08/09/18 11:14 am INR 2.8

## 2018-08-09 NOTE — Telephone Encounter (Signed)
Pt Called and aware

## 2018-08-14 ENCOUNTER — Encounter: Payer: Self-pay | Admitting: Family Medicine

## 2018-08-14 ENCOUNTER — Ambulatory Visit (INDEPENDENT_AMBULATORY_CARE_PROVIDER_SITE_OTHER): Payer: Medicare Other | Admitting: Family Medicine

## 2018-08-14 VITALS — BP 142/82 | HR 68 | Temp 96.8°F | Ht 64.0 in | Wt 155.0 lb

## 2018-08-14 DIAGNOSIS — M545 Low back pain, unspecified: Secondary | ICD-10-CM

## 2018-08-14 DIAGNOSIS — I700 Atherosclerosis of aorta: Secondary | ICD-10-CM | POA: Diagnosis not present

## 2018-08-14 DIAGNOSIS — G8929 Other chronic pain: Secondary | ICD-10-CM

## 2018-08-14 DIAGNOSIS — E559 Vitamin D deficiency, unspecified: Secondary | ICD-10-CM | POA: Diagnosis not present

## 2018-08-14 DIAGNOSIS — E7800 Pure hypercholesterolemia, unspecified: Secondary | ICD-10-CM

## 2018-08-14 DIAGNOSIS — I10 Essential (primary) hypertension: Secondary | ICD-10-CM

## 2018-08-14 DIAGNOSIS — I4821 Permanent atrial fibrillation: Secondary | ICD-10-CM

## 2018-08-14 DIAGNOSIS — I272 Pulmonary hypertension, unspecified: Secondary | ICD-10-CM

## 2018-08-14 MED ORDER — TORSEMIDE 10 MG PO TABS: 10.0000 mg | ORAL_TABLET | Freq: Every day | ORAL | 0 refills | Status: DC | PRN

## 2018-08-14 NOTE — Progress Notes (Signed)
Subjective:    Patient ID: Melinda Collins, female    DOB: 12/06/1940, 78 y.o.   MRN: [id number]  HPI Pt here for follow up and management of chronic medical problems which includes hypertension and hyperlipidemia. She is taking medication regularly.  The patient is wanting to get an MRI of her back because of ongoing severe pain.  She does not want to get any lab work FOBT or chest x-ray.  Patient has atrial fibrillation and several surgeries on her back but with not a lot of relief.  The patient comes with her daughter-in-law to the visit today.  Her spirit and mood is much better.  She is working on getting some dentures.  She continues to have the back pain and continues to be followed by Dr. Carloyn Manner.  She would like to get another MRI as she has not had an MRI in a long time and the back pain is continuing on and no one will operate on her but because of her condition.  She denies any chest pain pressure tightness or shortness of breath.  She denies any problem with her intestinal track.  She denies any nausea or vomiting or blood in the stool.  She is passing her water without problems.   Patient Active Problem List   Diagnosis Date Noted  . Pulmonary nodules 02/08/2017  . Aortic atherosclerosis ([place]) 04/22/2016  . Permanent atrial fibrillation 10/16/2014  . Metabolic syndrome 31/51/7616  . History of uterine cancer 12/03/2013  . Chronic low back pain 05/23/2013  . High risk medication use 05/23/2013  . Swelling of limb 05/03/2013  . Hypertension 05/17/2011  . Hyperlipidemia 02/10/2009  . TOBACCO ABUSE 02/10/2009  . 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 Atrial fibrillation ([place]) 02/10/2009   Outpatient Encounter Medications as of 08/14/2018  Medication Sig  . amLODipine (NORVASC) 5 MG tablet TAKE ONE TABLET BY MOUTH ONCE DAILY (MORNING)  . atorvastatin (LIPITOR) 80 MG tablet TAKE ONE TABLET BY MOUTH DAILY. (,EVENING)  . BREO ELLIPTA 100-25 MCG/INH AEPB Take 1 puff by mouth daily.    . cholecalciferol (VITAMIN D) 1000 units tablet Take 1 tablet (1,000 Units total) by mouth daily with breakfast.  . diltiazem (CARDIZEM) 30 MG tablet TAKE ONE TABLET BY MOUTH TWICE DAILY (MORNING ,EVENING)  . DULoxetine (CYMBALTA) 60 MG capsule Take 1 capsule (60 mg total) by mouth daily.  Marland Kitchen esomeprazole (NEXIUM) 40 MG capsule Take 1 capsule (40 mg total) by mouth daily at 12 noon.  . fluticasone (FLONASE) 50 MCG/ACT nasal spray USE 1 SPRAY IN EACH NOSTRIL AT BEDTIME.  Marland Kitchen guaiFENesin (MUCINEX) 600 MG 12 hr tablet Take 1 tablet (600 mg total) by mouth 2 (two) times daily as needed.  . INCRUSE ELLIPTA 62.5 MCG/INH AEPB Take 1 puff by mouth daily.  Marland Kitchen loratadine (CLARITIN) 10 MG tablet TAKE ONE TABLET BY MOUTH DAILY. (,EVENING)  . Multiple Vitamins-Minerals (CENTRUM SILVER PO) Take 1 tablet by mouth daily.    . NON FORMULARY Oxygen 3L/min   . oxyCODONE-acetaminophen (PERCOCET) 5-325 MG per tablet Take 2 tablets by mouth every 8 (eight) hours as needed.    . potassium chloride (K-DUR,KLOR-CON) 10 MEQ tablet TAKE ONE TABLET BY MOUTH DAILY. (MORNING)  . PROAIR HFA 108 (90 Base) MCG/ACT inhaler INHALE 1-2 PUFFS EVERY 4 TO 6 HOURS AS NEEDED FOR SHORTNESS OF BREATH  . torsemide (DEMADEX) 20 MG tablet TAKE ONE AND ONE-HALF TABLETS BY MOUTH ONCE DAILY (MORNING)  . warfarin (COUMADIN) 5 MG tablet TAKE 1 OR 2  TABLETS BY MOUTH AS DIRECTED BY WARFARIN CLINIC. (,EVENING)  . [DISCONTINUED] azelastine (ASTELIN) 0.1 % nasal spray Place 1 spray into both nostrils 2 (two) times daily. Use in each nostril as directed  . [DISCONTINUED] cefdinir (OMNICEF) 300 MG capsule Take 1 capsule (300 mg total) by mouth 2 (two) times daily. 1 po BID  . [DISCONTINUED] cephALEXin (KEFLEX) 500 MG capsule Take 1 capsule (500 mg total) by mouth 3 (three) times daily.  . [DISCONTINUED] oxyCODONE ER (XTAMPZA ER) 13.5 MG C12A Take 1 tablet by mouth 2 (two) times daily.  . [DISCONTINUED] pseudoephedrine-guaifenesin (MUCINEX D) 60-600 MG 12  hr tablet Take 1 tablet by mouth every 12 (twelve) hours.  . [DISCONTINUED] vitamin B-12 (CYANOCOBALAMIN) 1000 MCG tablet Take 1 tablet (1,000 mcg total) by mouth daily with breakfast.   No facility-administered encounter medications on file as of 08/14/2018.       Review of Systems  Constitutional: Negative.   HENT: Negative.   Eyes: Negative.   Respiratory: Negative.   Cardiovascular: Negative.   Gastrointestinal: Negative.   Endocrine: Negative.   Genitourinary: Negative.   Musculoskeletal: Positive for back pain.  Skin: Negative.   Allergic/Immunologic: Negative.   Neurological: Negative.   Hematological: Negative.   Psychiatric/Behavioral: Negative.        Objective:   Physical Exam Vitals signs and nursing note reviewed.  Constitutional:      Appearance: Normal appearance. She is well-developed and normal weight. She is not ill-appearing.  HENT:     Head: Normocephalic and atraumatic.     Nose: Nose normal. No congestion.  Eyes:     General: No scleral icterus.       Right eye: No discharge.        Left eye: No discharge.     Conjunctiva/sclera: Conjunctivae normal.     Pupils: Pupils are equal, round, and reactive to light.  Neck:     Musculoskeletal: Normal range of motion and neck supple.     Thyroid: No thyromegaly.     Vascular: No carotid bruit or JVD.  Cardiovascular:     Rate and Rhythm: Normal rate. Rhythm irregular.     Heart sounds: Normal heart sounds. No murmur.     Comments: Irregular irregular at 96/min.  No edema Pulmonary:     Effort: Pulmonary effort is normal. No respiratory distress.     Breath sounds: Wheezing present. No rales.     Comments: Expiratory wheezes Abdominal:     General: Abdomen is flat. Bowel sounds are normal.     Palpations: Abdomen is soft. There is no mass.     Tenderness: There is no abdominal tenderness.  Musculoskeletal:        General: Tenderness present.     Right lower leg: No edema.     Left lower leg: No  edema.     Comments: Patient has trouble laying down to increase back pain.  Needed assistance with getting on the table.  This is an ongoing issue and problem.  She is trying to get more exercise and and this may be helping some.  Lymphadenopathy:     Cervical: No cervical adenopathy.  Skin:    General: Skin is warm and dry.     Findings: No rash.  Neurological:     General: No focal deficit present.     Mental Status: She is alert and oriented to person, place, and time. Mental status is at baseline.     Cranial Nerves: No cranial nerve deficit.  Deep Tendon Reflexes: Reflexes are normal and symmetric. Reflexes normal.  Psychiatric:        Mood and Affect: Mood normal.        Behavior: Behavior normal.        Thought Content: Thought content normal.        Judgment: Judgment normal.     Comments: Mood affect and behavior for this patient are normal.    BP (!) 142/82 (BP Location: Right Arm)   Pulse 68   Temp (!) 96.8 F (36 C) (Oral)   Ht 5\' 4"  (1.626 m)   Wt 155 lb (70.3 kg)   BMI 26.61 kg/m         Assessment & Plan:  1. Pure hypercholesterolemia -Continue with current treatment  2. Essential hypertension -Systolic blood pressure slightly elevated today but no change in treatment  3. Vitamin D deficiency -Continue with vitamin D replacement  4. Permanent atrial fibrillation -Patient remains in atrial fibrillation at about 96/min  5. Aortic atherosclerosis ([place]) -Continue aggressive therapeutic lifestyle changes and low-cholesterol diet  6. Chronic low back pain, unspecified back pain laterality, unspecified whether sciatica present -This pain is continuing and the patient is concerned because she has not had an MRI in a good while. - MR Lumbar Spine Wo Contrast; Future  7. Chronic pulmonary hypertension ([place]) -Continue with smoking cessation and following up with pulmonologist as needed  Meds ordered this encounter  Medications  . torsemide (DEMADEX)  10 MG tablet    Sig: Take 1 tablet (10 mg total) by mouth daily as needed.    Dispense:  30 tablet    Refill:  0    Fill in seperate bottle   Patient Instructions                       Medicare Annual Wellness Visit  [facility] and the medical providers at [facility] strive to bring you the best medical care.  In doing so we not only want to address your current medical conditions and concerns but also to detect new conditions early and prevent illness, disease and health-related problems.    Medicare offers a yearly Wellness Visit which allows our clinical staff to assess your need for preventative services including immunizations, lifestyle education, counseling to decrease risk of preventable diseases and screening for fall risk and other medical concerns.    This visit is provided free of charge (no copay) for all Medicare recipients. The clinical pharmacists at [facility] have begun to conduct these Wellness Visits which will also include a thorough review of all your medications.    As you primary medical provider recommend that you make an appointment for your Annual Wellness Visit if you have not done so already this year.  You may set up this appointment before you leave today or you may call back ([phone]) and schedule an appointment.  Please make sure when you call that you mention that you are scheduling your Annual Wellness Visit with the clinical pharmacist so that the appointment may be made for the proper length of time.    Continue current medications. Continue good therapeutic lifestyle changes which include good diet and exercise. Fall precautions discussed with patient. If an FOBT was given today- please return it to our front desk. If you are over 39 years old - you may need Prevnar 62 or the adult Pneumonia vaccine.  **Flu shots are available--- please call and schedule a  FLU-CLINIC appointment**  After your visit with  [place] today you will receive a survey in the mail or online from [employer] regarding your care with [place]. Please take a moment to fill this out. Your feedback is very important to [place] as you can help [place] better understand your patient needs as well as improve your experience and satisfaction. WE CARE ABOUT YOU!!!   We will arrange for you to have an MRI at [facility] in [place]. Continue with your current medicines from the neurosurgeon. Continue to refrain from smoking.  Arrie Senate MD

## 2018-08-14 NOTE — Patient Instructions (Addendum)
Medicare Annual Wellness Visit  [facility] and the medical providers at [facility] strive to bring you the best medical care.  In doing so we not only want to address your current medical conditions and concerns but also to detect new conditions early and prevent illness, disease and health-related problems.    Medicare offers a yearly Wellness Visit which allows our clinical staff to assess your need for preventative services including immunizations, lifestyle education, counseling to decrease risk of preventable diseases and screening for fall risk and other medical concerns.    This visit is provided free of charge (no copay) for all Medicare recipients. The clinical pharmacists at [facility] have begun to conduct these Wellness Visits which will also include a thorough review of all your medications.    As you primary medical provider recommend that you make an appointment for your Annual Wellness Visit if you have not done so already this year.  You may set up this appointment before you leave today or you may call back ([phone]) and schedule an appointment.  Please make sure when you call that you mention that you are scheduling your Annual Wellness Visit with the clinical pharmacist so that the appointment may be made for the proper length of time.    Continue current medications. Continue good therapeutic lifestyle changes which include good diet and exercise. Fall precautions discussed with patient. If an FOBT was given today- please return it to our front desk. If you are over 65 years old - you may need Prevnar 63 or the adult Pneumonia vaccine.  **Flu shots are available--- please call and schedule a FLU-CLINIC appointment**  After your visit with [place] today you will receive a survey in the mail or online from [employer] regarding your care with [place]. Please take a moment to fill this out. Your feedback is very  important to [place] as you can help [place] better understand your patient needs as well as improve your experience and satisfaction. WE CARE ABOUT YOU!!!   We will arrange for you to have an MRI at [facility] in [place]. Continue with your current medicines from the neurosurgeon. Continue to refrain from smoking.

## 2018-08-21 ENCOUNTER — Other Ambulatory Visit: Payer: Self-pay | Admitting: Family Medicine

## 2018-08-23 ENCOUNTER — Ambulatory Visit (HOSPITAL_COMMUNITY): Payer: Medicare Other

## 2018-08-24 ENCOUNTER — Telehealth: Payer: Self-pay | Admitting: *Deleted

## 2018-08-24 DIAGNOSIS — Z7901 Long term (current) use of anticoagulants: Secondary | ICD-10-CM | POA: Diagnosis not present

## 2018-08-24 DIAGNOSIS — I4821 Permanent atrial fibrillation: Secondary | ICD-10-CM | POA: Diagnosis not present

## 2018-08-24 NOTE — Telephone Encounter (Signed)
conitnue coumadin 5 mg daily except 7.5 mg on friday

## 2018-08-24 NOTE — Telephone Encounter (Signed)
Pt aware.

## 2018-08-24 NOTE — Telephone Encounter (Signed)
Fax received mdINR PT/INR self testing service Test date/time 08/15/18 10:44 am INR 2.7

## 2018-08-27 ENCOUNTER — Ambulatory Visit: Payer: Medicare Other | Admitting: Family Medicine

## 2018-08-27 ENCOUNTER — Telehealth: Payer: Self-pay | Admitting: *Deleted

## 2018-08-27 NOTE — Telephone Encounter (Signed)
Fax received mdINR PT/INR self testing service Test date/time 08/24/18 10:00 am INR 3.1

## 2018-08-27 NOTE — Telephone Encounter (Signed)
Pt aware of med change

## 2018-08-27 NOTE — Telephone Encounter (Signed)
Hold Coumadin today and restart regular dosing tomorrow and recheck INR again in 1 week

## 2018-09-03 ENCOUNTER — Ambulatory Visit (HOSPITAL_COMMUNITY)
Admission: RE | Admit: 2018-09-03 | Discharge: 2018-09-03 | Disposition: A | Discharge status: Home / Self Care | Payer: Medicare Other | Source: Ambulatory Visit | Attending: Family Medicine | Admitting: Family Medicine

## 2018-09-03 DIAGNOSIS — M5117 Intervertebral disc disorders with radiculopathy, lumbosacral region: Secondary | ICD-10-CM | POA: Diagnosis not present

## 2018-09-03 DIAGNOSIS — M4807 Spinal stenosis, lumbosacral region: Secondary | ICD-10-CM | POA: Diagnosis not present

## 2018-09-03 DIAGNOSIS — G8929 Other chronic pain: Secondary | ICD-10-CM | POA: Insufficient documentation

## 2018-09-03 DIAGNOSIS — M545 Low back pain: Secondary | ICD-10-CM | POA: Diagnosis not present

## 2018-09-04 ENCOUNTER — Telehealth: Payer: Self-pay | Admitting: *Deleted

## 2018-09-04 NOTE — Telephone Encounter (Signed)
ntinue current dose of coumadin 5mg  daily and recehck INR in 4 weeks

## 2018-09-04 NOTE — Telephone Encounter (Signed)
Pt aware to continue current dose

## 2018-09-04 NOTE — Telephone Encounter (Signed)
Fax received mdINR PT/INR self testing service Test date/time 08/31/18 10:03 am INR 2.3

## 2018-09-05 ENCOUNTER — Other Ambulatory Visit: Payer: Self-pay | Admitting: Family Medicine

## 2018-09-06 DIAGNOSIS — J449 Chronic obstructive pulmonary disease, unspecified: Secondary | ICD-10-CM | POA: Diagnosis not present

## 2018-09-10 ENCOUNTER — Telehealth: Payer: Self-pay | Admitting: Family Medicine

## 2018-09-10 NOTE — Telephone Encounter (Signed)
Daughter-in law aware of results and copy sent to Dr. Carloyn Manner.  Patient has an appt with Dr. Carloyn Manner tomorrow 09/11/2018

## 2018-09-11 DIAGNOSIS — M4716 Other spondylosis with myelopathy, lumbar region: Secondary | ICD-10-CM | POA: Diagnosis not present

## 2018-09-11 DIAGNOSIS — M48061 Spinal stenosis, lumbar region without neurogenic claudication: Secondary | ICD-10-CM | POA: Diagnosis not present

## 2018-09-13 ENCOUNTER — Telehealth: Payer: Self-pay | Admitting: *Deleted

## 2018-09-13 NOTE — Telephone Encounter (Signed)
Fax received mdINR PT/INR self testing service Test date/time 09/06/18 11:33 am INR 3.3

## 2018-09-13 NOTE — Telephone Encounter (Signed)
continue current dose of coumadin- only need to check INR every month unless we tell you to check sooner.

## 2018-09-13 NOTE — Telephone Encounter (Signed)
2nd Fax received mdINR PT/INR self testing service Test date/time 09/12/18 11:35 am INR 2.9

## 2018-09-19 ENCOUNTER — Other Ambulatory Visit: Payer: Self-pay | Admitting: Family Medicine

## 2018-09-19 DIAGNOSIS — Z7901 Long term (current) use of anticoagulants: Secondary | ICD-10-CM | POA: Diagnosis not present

## 2018-09-19 DIAGNOSIS — I4821 Permanent atrial fibrillation: Secondary | ICD-10-CM | POA: Diagnosis not present

## 2018-09-20 ENCOUNTER — Telehealth: Payer: Self-pay | Admitting: *Deleted

## 2018-09-20 NOTE — Telephone Encounter (Signed)
Fax received mdINR PT/INR self testing service Test date/time 09/19/18 10:25 am INR 3.1

## 2018-09-20 NOTE — Telephone Encounter (Signed)
Aware. 

## 2018-09-20 NOTE — Telephone Encounter (Signed)
INR is at goal. Continue warfarin at current dose

## 2018-10-04 ENCOUNTER — Telehealth: Payer: Self-pay | Admitting: *Deleted

## 2018-10-04 NOTE — Telephone Encounter (Signed)
Carol aware  

## 2018-10-04 NOTE — Telephone Encounter (Signed)
Please speak with patient's daughter-in-law and tell her to continue with the patient's current treatment regimen with Coumadin and repeat the INR again in a week.  Please have her remind the patient to stay at home inside the house and to not be out of the house for any reason so that she does not get sick with this coronavirus that is ramping up in our community.

## 2018-10-04 NOTE — Telephone Encounter (Signed)
Fax received mdINR PT/INR self testing service Test date/time 10/02/18 11:43 am INR 2.8

## 2018-10-05 DIAGNOSIS — J449 Chronic obstructive pulmonary disease, unspecified: Secondary | ICD-10-CM | POA: Diagnosis not present

## 2018-10-05 DIAGNOSIS — Q7649 Other congenital malformations of spine, not associated with scoliosis: Secondary | ICD-10-CM | POA: Diagnosis not present

## 2018-10-12 ENCOUNTER — Telehealth: Payer: Self-pay | Admitting: *Deleted

## 2018-10-12 NOTE — Telephone Encounter (Signed)
Spoke to patient on the phone and she is aware that INR is too high. She denies any bleeding. She is currently taking 10mg  3 days per week and 5mg  daily all other days.  She states that these are prepackaged by her pharmacy.  She took her usual 5mg  last evening.  She is due for 10mg  tonight.  Her total Coumadin is 50mg / week.  I have recommended she skip tonight's dose of 10mg  and resume normal dosing schedule tomorrow.  Recheck INR in 1 week.  Goal 2-3 for Afib.

## 2018-10-12 NOTE — Telephone Encounter (Signed)
Fax received mdINR PT/INR self testing service Test date/time 10/11/18 1:34 pm INR 3.4

## 2018-10-12 NOTE — Telephone Encounter (Signed)
Agree with recommendations per Dr Lajuana Ripple

## 2018-10-16 ENCOUNTER — Other Ambulatory Visit: Payer: Self-pay | Admitting: Family Medicine

## 2018-10-16 DIAGNOSIS — I4821 Permanent atrial fibrillation: Secondary | ICD-10-CM | POA: Diagnosis not present

## 2018-10-16 DIAGNOSIS — Z7901 Long term (current) use of anticoagulants: Secondary | ICD-10-CM | POA: Diagnosis not present

## 2018-10-17 ENCOUNTER — Other Ambulatory Visit: Payer: Self-pay | Admitting: Family Medicine

## 2018-10-19 ENCOUNTER — Telehealth: Payer: Self-pay | Admitting: *Deleted

## 2018-10-19 NOTE — Telephone Encounter (Signed)
The INR is slightly increased today.  Please hold today's dose and then resume previous dosing and recheck again in 1 week.

## 2018-10-19 NOTE — Telephone Encounter (Signed)
Fax received mdINR PT/INR self testing service Test date/time 10/16/18 9:41 am INR 3.1

## 2018-10-19 NOTE — Telephone Encounter (Signed)
Pt aware - will hold today and resume normal dosing tomorrow

## 2018-10-25 ENCOUNTER — Other Ambulatory Visit: Payer: Self-pay | Admitting: Nurse Practitioner

## 2018-10-25 ENCOUNTER — Telehealth: Payer: Self-pay | Admitting: *Deleted

## 2018-10-25 NOTE — Telephone Encounter (Signed)
Fax received mdINR PT/INR self testing service Test date/time 10/23/18 9:13 am INR 2.3

## 2018-10-25 NOTE — Telephone Encounter (Signed)
Pt aware to continue current coumadin dose

## 2018-10-25 NOTE — Telephone Encounter (Signed)
Continue current dose of coumadin and recheck in 4 weeks

## 2018-11-01 ENCOUNTER — Telehealth: Payer: Self-pay | Admitting: *Deleted

## 2018-11-01 NOTE — Telephone Encounter (Signed)
Fax received mdINR PT/INR self testing service Test date/time 10/29/18 2:21 pm INR 2.5

## 2018-11-02 NOTE — Telephone Encounter (Signed)
Pt aware - normal = continue same dose.

## 2018-11-05 DIAGNOSIS — J449 Chronic obstructive pulmonary disease, unspecified: Secondary | ICD-10-CM | POA: Diagnosis not present

## 2018-11-05 DIAGNOSIS — Q7649 Other congenital malformations of spine, not associated with scoliosis: Secondary | ICD-10-CM | POA: Diagnosis not present

## 2018-11-12 ENCOUNTER — Telehealth: Payer: Self-pay | Admitting: *Deleted

## 2018-11-12 NOTE — Telephone Encounter (Signed)
Fax received mdINR PT/INR self testing service Test date/time 11/08/18 10:16 am INR 2.8

## 2018-11-12 NOTE — Telephone Encounter (Signed)
Pt aware.

## 2018-11-12 NOTE — Telephone Encounter (Addendum)
Continue coumadin 5mg  daily except 7.5mg  on Friday's Recheck in 4 weeks

## 2018-11-13 ENCOUNTER — Other Ambulatory Visit: Payer: Self-pay | Admitting: Family Medicine

## 2018-11-15 DIAGNOSIS — I4821 Permanent atrial fibrillation: Secondary | ICD-10-CM | POA: Diagnosis not present

## 2018-11-15 DIAGNOSIS — Z7901 Long term (current) use of anticoagulants: Secondary | ICD-10-CM | POA: Diagnosis not present

## 2018-11-16 ENCOUNTER — Telehealth: Payer: Self-pay | Admitting: *Deleted

## 2018-11-16 NOTE — Telephone Encounter (Signed)
Continue coumadin 5mg  daily except 7.5mg  on friday

## 2018-11-16 NOTE — Telephone Encounter (Signed)
Pt aware of coumadin dosage

## 2018-11-16 NOTE — Telephone Encounter (Signed)
Fax received mdINR PT/INR self testing service Test date/time 11/15/18 1:38 pm INR 2.6

## 2018-11-23 ENCOUNTER — Telehealth: Payer: Self-pay | Admitting: *Deleted

## 2018-11-23 NOTE — Telephone Encounter (Addendum)
LMOVM to continue current dose of coumadin Also called [name] and told her as well

## 2018-11-23 NOTE — Telephone Encounter (Signed)
Fax received mdINR PT/INR self testing service Test date/time 11/22/18 3:33 pm INR 2.4

## 2018-11-23 NOTE — Telephone Encounter (Signed)
Continue coumadin at same dose and recheck in 4 weeks

## 2018-11-29 ENCOUNTER — Other Ambulatory Visit: Payer: Self-pay | Admitting: Family Medicine

## 2018-12-05 DIAGNOSIS — J449 Chronic obstructive pulmonary disease, unspecified: Secondary | ICD-10-CM | POA: Diagnosis not present

## 2018-12-05 DIAGNOSIS — Q7649 Other congenital malformations of spine, not associated with scoliosis: Secondary | ICD-10-CM | POA: Diagnosis not present

## 2018-12-11 ENCOUNTER — Telehealth: Payer: Self-pay | Admitting: *Deleted

## 2018-12-11 NOTE — Telephone Encounter (Signed)
Fax received mdINR PT/INR self testing service Test date/time 12/10/18 2:53 pm INR 2.5

## 2018-12-11 NOTE — Telephone Encounter (Signed)
Patient with permanent atrial fibrillation on chronic anticoagulation.  Her INR is therapeutic with a goal INR of 2-3.  Continue current regimen and repeat testing in 2 to 4 weeks.

## 2018-12-12 ENCOUNTER — Other Ambulatory Visit: Payer: Self-pay | Admitting: Family Medicine

## 2018-12-12 NOTE — Telephone Encounter (Signed)
Pt aware.

## 2018-12-13 ENCOUNTER — Ambulatory Visit: Payer: Medicare Other | Admitting: Family Medicine

## 2018-12-13 NOTE — Telephone Encounter (Signed)
I do not see on patients medication list 

## 2018-12-21 DIAGNOSIS — I4821 Permanent atrial fibrillation: Secondary | ICD-10-CM | POA: Diagnosis not present

## 2018-12-21 DIAGNOSIS — Z7901 Long term (current) use of anticoagulants: Secondary | ICD-10-CM | POA: Diagnosis not present

## 2018-12-26 ENCOUNTER — Telehealth: Payer: Self-pay | Admitting: *Deleted

## 2018-12-26 NOTE — Telephone Encounter (Signed)
Pt aware to cont. Current dose

## 2018-12-26 NOTE — Telephone Encounter (Signed)
Fax received mdINR PT/INR self testing service Test date/time 12/21/18 12:12 pm INR 2.2

## 2019-01-02 ENCOUNTER — Telehealth: Payer: Self-pay | Admitting: *Deleted

## 2019-01-02 NOTE — Telephone Encounter (Signed)
protime checked today at home with [facility] INR -2.3 Range is 2-3 for this pt.  Pt aware to continue same dose per [facility]

## 2019-01-04 ENCOUNTER — Other Ambulatory Visit: Payer: Self-pay

## 2019-01-04 ENCOUNTER — Encounter: Payer: Self-pay | Admitting: Family Medicine

## 2019-01-04 ENCOUNTER — Ambulatory Visit (INDEPENDENT_AMBULATORY_CARE_PROVIDER_SITE_OTHER): Payer: Medicare Other | Admitting: Family Medicine

## 2019-01-04 DIAGNOSIS — R21 Rash and other nonspecific skin eruption: Secondary | ICD-10-CM | POA: Diagnosis not present

## 2019-01-04 DIAGNOSIS — W57XXXA Bitten or stung by nonvenomous insect and other nonvenomous arthropods, initial encounter: Secondary | ICD-10-CM | POA: Diagnosis not present

## 2019-01-04 DIAGNOSIS — R110 Nausea: Secondary | ICD-10-CM | POA: Diagnosis not present

## 2019-01-04 MED ORDER — DOXYCYCLINE HYCLATE 100 MG PO TABS: 100.0000 mg | ORAL_TABLET | Freq: Two times a day (BID) | ORAL | 0 refills | Status: AC

## 2019-01-04 NOTE — Progress Notes (Signed)
Virtual Visit via telephone Note Due to COVID-19, visit is conducted virtually and was requested by patient. This visit type was conducted due to national recommendations for restrictions regarding the COVID-19 Pandemic (e.g. social distancing) in an effort to limit this patient's exposure and mitigate transmission in our community. All issues noted in this document were discussed and addressed.  A physical exam was not performed with this format.   I connected with Melinda G Stanko on 01/04/19 at 1630 by telephone and verified that I am speaking with the correct person using two identifiers. Melinda Collins is currently located at home and family is currently with them during visit. The provider, Monia Pouch, FNP is located in their office at time of visit.  I discussed the limitations, risks, security and privacy concerns of performing an evaluation and management service by telephone and the availability of in person appointments. I also discussed with the patient that there may be a patient responsible charge related to this service. The patient expressed understanding and agreed to proceed.  Subjective:  Patient ID: Melinda Collins, female    DOB: January 11, 1941, 78 y.o.   MRN: [id number]  Chief Complaint:  Rash (tick bite)   HPI: Melinda Collins is a 78 y.o. female presenting on 01/04/2019 for Rash (tick bite)   Pt reports she had an embedded tick to her mid lower back. States she removed the tick 2 days ago. States she now has a red rash around the bite. States she has had nausea and headache since the tick was removed. Pt denies fever, chills, weakness, of confusion. States she does have fatigue. No abdominal pain, vomiting, or diarrhea. No increase in arthralgias.   Rash This is a new problem. The current episode started in the past 7 days. The problem is unchanged. The affected locations include the back. The rash is characterized by redness, swelling and itchiness.  Associated with: tick bite. Associated symptoms include fatigue. Pertinent negatives include no anorexia, congestion, cough, diarrhea, eye pain, facial edema, fever, joint pain, nail changes, rhinorrhea, shortness of breath, sore throat or vomiting. Past treatments include nothing.     Relevant past medical, surgical, family, and social history reviewed and updated as indicated.  Allergies and medications reviewed and updated.   Past Medical History:  Diagnosis Date  . Arthritis   . COPD (chronic obstructive pulmonary disease) (HCC)    spirometry 04/20/10 FEV1 1.38 (58%) with ration 70  . Depression   . Diastolic dysfunction    noted on echo w/some apparent volume issues in past but not severe   . HLD (hyperlipidemia)   . HOH (hard of hearing)   . HTN (hypertension)    mild; pulmonary  . Permanent atrial fibrillation   . Ruptured disk   . Tobacco abuse   . Uterine cancer [facility])     Past Surgical History:  Procedure Laterality Date  . ABDOMINAL HYSTERECTOMY     total  . CATARACT EXTRACTION W/PHACO Left 08/25/2014   Procedure: CATARACT EXTRACTION PHACO AND INTRAOCULAR LENS PLACEMENT (IOC);  Surgeon: Tonny Branch, MD;  Location: [facility];  Service: Ophthalmology;  Laterality: Left;  CDE 6.95  . CATARACT EXTRACTION W/PHACO Right 09/11/2014   Procedure: CATARACT EXTRACTION PHACO AND INTRAOCULAR LENS PLACEMENT (IOC);  Surgeon: Tonny Branch, MD;  Location: [facility];  Service: Ophthalmology;  Laterality: Right;  CDE:8.11  . CERVICAL DISC SURGERY     x2  . KYPHOPLASTY    . SPINE SURGERY  2013   cervical  fusion    Social History   Socioeconomic History  . Marital status: Married    Spouse name: Not on file  . Number of children: Not on file  . Years of education: Not on file  . Highest education level: Not on file  Occupational History  . Not on file  Social Needs  . Financial resource strain: Not on file  . Food insecurity    Worry: Not on file    Inability: Not on file  .  Transportation needs    Medical: Not on file    Non-medical: Not on file  Tobacco Use  . Smoking status: Current Every Day Smoker    Packs/day: 1.00    Types: Cigarettes    Start date: 07/18/1960  . Smokeless tobacco: Never Used  . Tobacco comment: Qut last week.  08/10/16 restarted smoking cigarettes  Substance and Sexual Activity  . Alcohol use: No    Alcohol/week: 0.0 standard drinks  . Drug use: No  . Sexual activity: Never    Birth control/protection: Surgical  Lifestyle  . Physical activity    Days per week: Not on file    Minutes per session: Not on file  . Stress: Not on file  Relationships  . Social Herbalist on phone: Not on file    Gets together: Not on file    Attends religious service: Not on file    Active member of club or organization: Not on file    Attends meetings of clubs or organizations: Not on file    Relationship status: Not on file  . Intimate partner violence    Fear of current or ex partner: Not on file    Emotionally abused: Not on file    Physically abused: Not on file    Forced sexual activity: Not on file  Other Topics Concern  . Not on file  Social History Narrative   Single, children; disabled.     Outpatient Encounter Medications as of 01/04/2019  Medication Sig  . albuterol (VENTOLIN HFA) 108 (90 Base) MCG/ACT inhaler INHALE 1-2 PUFFS EVERY 4 TO 6 HOURS AS NEEDED FOR SHORTNESS OF BREATH  . ALLERGY RELIEF 10 MG tablet TAKE ONE TABLET BY MOUTH DAILY. (,EVENING)  . amLODipine (NORVASC) 5 MG tablet TAKE ONE TABLET BY MOUTH ONCE DAILY (MORNING)  . atorvastatin (LIPITOR) 80 MG tablet TAKE ONE TABLET BY MOUTH DAILY. (,EVENING)  . BREO ELLIPTA 100-25 MCG/INH AEPB Take 1 puff by mouth daily.  . D3-1000 25 MCG (1000 UT) capsule TAKE ONE TABLET BY MOUTH DAILY WITH BREAKFAST.  Marland Kitchen diltiazem (CARDIZEM) 30 MG tablet TAKE ONE TABLET BY MOUTH TWICE DAILY (MORNING ,EVENING)  . doxycycline (VIBRA-TABS) 100 MG tablet Take 1 tablet (100 mg total)  by mouth 2 (two) times daily for 14 days. 1 po bid  . DULoxetine (CYMBALTA) 60 MG capsule TAKE ONE CAPSULE BY MOUTH DAILY. (,EVENING)  . esomeprazole (NEXIUM) 40 MG capsule TAKE ONE CAPSULE BY MOUTH DAILY AT 12 NOON. (MORNING-PER PT)  . fluticasone (FLONASE) 50 MCG/ACT nasal spray USE 1 SPRAY IN EACH NOSTRIL AT BEDTIME.  Marland Kitchen guaiFENesin (MUCINEX) 600 MG 12 hr tablet Take 1 tablet (600 mg total) by mouth 2 (two) times daily as needed.  . INCRUSE ELLIPTA 62.5 MCG/INH AEPB Take 1 puff by mouth daily.  . Multiple Vitamins-Minerals (CENTRUM SILVER PO) Take 1 tablet by mouth daily.    . NON FORMULARY Oxygen 3L/min   . oxyCODONE-acetaminophen (PERCOCET) 5-325 MG per tablet  Take 2 tablets by mouth every 8 (eight) hours as needed.    . potassium chloride (K-DUR,KLOR-CON) 10 MEQ tablet TAKE ONE TABLET BY MOUTH DAILY. (MORNING)  . torsemide (DEMADEX) 10 MG tablet Take 1 tablet (10 mg total) by mouth daily as needed.  . torsemide (DEMADEX) 20 MG tablet TAKE ONE AND ONE-HALF TABLETS BY MOUTH ONCE DAILY (MORNING)  . vitamin B-12 (CYANOCOBALAMIN) 1000 MCG tablet TAKE ONE TABLET BY MOUTH DAILY WITH BREAKFAST.  Marland Kitchen warfarin (COUMADIN) 5 MG tablet TAKE 1 OR 2 TABLETS BY MOUTH AS DIRECTED BY WARFARIN CLINIC. (,EVENING)   No facility-administered encounter medications on file as of 01/04/2019.     Allergies  Allergen Reactions  . Niaspan [Niacin]     Review of Systems  Constitutional: Positive for fatigue. Negative for activity change, appetite change, chills, diaphoresis, fever and unexpected weight change.  HENT: Negative for congestion, rhinorrhea and sore throat.   Eyes: Negative for pain.  Respiratory: Negative for cough, chest tightness and shortness of breath.   Cardiovascular: Negative for chest pain, palpitations and leg swelling.  Gastrointestinal: Positive for nausea. Negative for abdominal pain, anorexia, diarrhea and vomiting.  Genitourinary: Negative for decreased urine volume and difficulty  urinating.  Musculoskeletal: Positive for arthralgias. Negative for joint pain and myalgias.  Skin: Positive for rash. Negative for nail changes.  Neurological: Positive for headaches. Negative for dizziness, weakness and light-headedness.  Psychiatric/Behavioral: Negative for confusion.  All other systems reviewed and are negative.        Observations/Objective: No vital signs or physical exam, this was a telephone or virtual health encounter.  Pt alert and oriented, answers all questions appropriately, and able to speak in full sentences.    Assessment and Plan: Melinda was seen today for rash.  Diagnoses and all orders for this visit:  Tick bite, initial encounter Rash of back Nausea in adult Reported signs and symptoms concerning for tick born illness. Pt did removed a tick two days ago and symptoms started after removal of tick. No fever, petechial rash, or vomiting. Will empirically treat with doxycycline. Pt aware to report any new or worsening symptoms. Pt aware to have INR checked in 2 weeks. Medications as prescribed.  -     doxycycline (VIBRA-TABS) 100 MG tablet; Take 1 tablet (100 mg total) by mouth 2 (two) times daily for 14 days. 1 po bid     Follow Up Instructions: Return if symptoms worsen or fail to improve.    I discussed the assessment and treatment plan with the patient. The patient was provided an opportunity to ask questions and all were answered. The patient agreed with the plan and demonstrated an understanding of the instructions.   The patient was advised to call back or seek an in-person evaluation if the symptoms worsen or if the condition fails to improve as anticipated.  The above assessment and management plan was discussed with the patient. The patient verbalized understanding of and has agreed to the management plan. Patient is aware to call the clinic if symptoms persist or worsen. Patient is aware when to return to the clinic for a follow-up  visit. Patient educated on when it is appropriate to go to the emergency department.    I provided 15 minutes of non-face-to-face time during this encounter. The call started at 1630. The call ended at [place]. The other time was used for coordination of care.    Monia Pouch, FNP-C [facility] [address] [place], [place] [address] [phone]  [phone]    

## 2019-01-05 DIAGNOSIS — J449 Chronic obstructive pulmonary disease, unspecified: Secondary | ICD-10-CM | POA: Diagnosis not present

## 2019-01-05 DIAGNOSIS — Q7649 Other congenital malformations of spine, not associated with scoliosis: Secondary | ICD-10-CM | POA: Diagnosis not present

## 2019-01-11 ENCOUNTER — Telehealth: Payer: Self-pay | Admitting: *Deleted

## 2019-01-11 NOTE — Telephone Encounter (Signed)
Hold dose for today only and recheck again next Thursday.  Make sure her daughter-in-law is aware of this.

## 2019-01-11 NOTE — Telephone Encounter (Signed)
[name] called and aware of changes

## 2019-01-11 NOTE — Telephone Encounter (Signed)
Med INR fax today  Reading was 3.7  She is usually normal and appears to be currently taking 5 mg daily and 7.5 on Fridays.  Her range is 2-3  recent start of DOXY

## 2019-01-17 ENCOUNTER — Ambulatory Visit: Payer: Self-pay | Admitting: Family Medicine

## 2019-01-17 ENCOUNTER — Telehealth: Payer: Self-pay | Admitting: Family Medicine

## 2019-01-17 LAB — POCT INR: INR: 2.3 (ref 2.0–3.0)

## 2019-01-17 NOTE — Telephone Encounter (Signed)
Fax received mdINR PT/INR self testing service Test date/time 06/292020  INR 2.3

## 2019-01-17 NOTE — Progress Notes (Signed)
Faxed INR of 2.3, at goal. Continue current regimen. Recheck INR in 4 weeks.

## 2019-01-17 NOTE — Telephone Encounter (Signed)
At goal, continue current regimen. 5 mg everyday but Friday, 7.5 mg on Friday

## 2019-01-17 NOTE — Telephone Encounter (Signed)
Pt aware.

## 2019-01-26 DIAGNOSIS — I4821 Permanent atrial fibrillation: Secondary | ICD-10-CM | POA: Diagnosis not present

## 2019-01-26 DIAGNOSIS — Z7901 Long term (current) use of anticoagulants: Secondary | ICD-10-CM | POA: Diagnosis not present

## 2019-02-01 ENCOUNTER — Other Ambulatory Visit: Payer: Self-pay | Admitting: Family Medicine

## 2019-02-04 ENCOUNTER — Other Ambulatory Visit: Payer: Self-pay | Admitting: Family Medicine

## 2019-02-04 DIAGNOSIS — Q7649 Other congenital malformations of spine, not associated with scoliosis: Secondary | ICD-10-CM | POA: Diagnosis not present

## 2019-02-04 DIAGNOSIS — J449 Chronic obstructive pulmonary disease, unspecified: Secondary | ICD-10-CM | POA: Diagnosis not present

## 2019-02-07 ENCOUNTER — Ambulatory Visit: Payer: Self-pay | Admitting: Family Medicine

## 2019-02-07 ENCOUNTER — Telehealth: Payer: Self-pay | Admitting: *Deleted

## 2019-02-07 LAB — POCT INR: INR: 2.8 (ref 2.0–3.0)

## 2019-02-07 NOTE — Telephone Encounter (Signed)
Fax received mdINR PT/INR self testing service Test date/time 02/02/19 2:49 pm INR 2.8

## 2019-02-07 NOTE — Telephone Encounter (Signed)
INR 2.8, at goal, no change in therapy.

## 2019-02-07 NOTE — Progress Notes (Signed)
INR 2.8, at goal, no change in therapy.

## 2019-02-07 NOTE — Telephone Encounter (Signed)
Patient aware.

## 2019-02-14 ENCOUNTER — Telehealth: Payer: Self-pay | Admitting: *Deleted

## 2019-02-14 NOTE — Telephone Encounter (Signed)
Fax received mdINR PT/INR self testing service Test date/time 02/13/19 2:37 pm INR 2.6

## 2019-02-14 NOTE — Telephone Encounter (Signed)
There is no dosing in the chart, please call patient and figure out her dosing, her INR is good so we will keep the same dose but I just need to get it entered in what dose she is actually taking currently. Melinda Pina, MD [facility] 02/14/2019, 5:47 PM

## 2019-02-15 NOTE — Telephone Encounter (Signed)
Daughter-in law states that patient is currently taking coumadin 5mg  daily except on Tuesday takes 7.5mg .  Aware to continue at current dose

## 2019-02-18 DIAGNOSIS — G894 Chronic pain syndrome: Secondary | ICD-10-CM | POA: Diagnosis not present

## 2019-02-18 DIAGNOSIS — E559 Vitamin D deficiency, unspecified: Secondary | ICD-10-CM | POA: Diagnosis not present

## 2019-02-18 DIAGNOSIS — M129 Arthropathy, unspecified: Secondary | ICD-10-CM | POA: Diagnosis not present

## 2019-02-18 DIAGNOSIS — M4716 Other spondylosis with myelopathy, lumbar region: Secondary | ICD-10-CM | POA: Diagnosis not present

## 2019-02-18 DIAGNOSIS — Z79899 Other long term (current) drug therapy: Secondary | ICD-10-CM | POA: Diagnosis not present

## 2019-02-18 DIAGNOSIS — I10 Essential (primary) hypertension: Secondary | ICD-10-CM | POA: Diagnosis not present

## 2019-02-21 ENCOUNTER — Other Ambulatory Visit: Payer: Self-pay | Admitting: Family Medicine

## 2019-02-24 DIAGNOSIS — I4821 Permanent atrial fibrillation: Secondary | ICD-10-CM | POA: Diagnosis not present

## 2019-02-24 DIAGNOSIS — Z7901 Long term (current) use of anticoagulants: Secondary | ICD-10-CM | POA: Diagnosis not present

## 2019-02-28 ENCOUNTER — Telehealth: Payer: Self-pay | Admitting: *Deleted

## 2019-02-28 NOTE — Telephone Encounter (Signed)
Description   Continue taking  5mg  daily except on Tuesday takes 7.5mg   inr 2.8 ( goal 2.0-3.0) looks great Recheck in 2-4 weeks     Caryl Pina, MD [facility] 02/28/2019, 1:00 PM     Patient also needs to be assigned a PCP, looks like she saw Monia Pouch once in the past and besides that and seen Dr. Laurance Flatten

## 2019-02-28 NOTE — Telephone Encounter (Signed)
Fax received mdINR PT/INR self testing service Test date/time 02/24/19 9:10 am INR 2.8

## 2019-02-28 NOTE — Telephone Encounter (Signed)
lmtcb

## 2019-03-01 ENCOUNTER — Other Ambulatory Visit: Payer: Self-pay | Admitting: Family Medicine

## 2019-03-04 ENCOUNTER — Other Ambulatory Visit: Payer: Self-pay | Admitting: Family Medicine

## 2019-03-06 ENCOUNTER — Other Ambulatory Visit: Payer: Self-pay | Admitting: Family Medicine

## 2019-03-07 ENCOUNTER — Telehealth: Payer: Self-pay | Admitting: *Deleted

## 2019-03-07 ENCOUNTER — Ambulatory Visit (INDEPENDENT_AMBULATORY_CARE_PROVIDER_SITE_OTHER): Payer: Medicare Other | Admitting: Family Medicine

## 2019-03-07 DIAGNOSIS — J449 Chronic obstructive pulmonary disease, unspecified: Secondary | ICD-10-CM | POA: Diagnosis not present

## 2019-03-07 DIAGNOSIS — I4821 Permanent atrial fibrillation: Secondary | ICD-10-CM

## 2019-03-07 DIAGNOSIS — Q7649 Other congenital malformations of spine, not associated with scoliosis: Secondary | ICD-10-CM | POA: Diagnosis not present

## 2019-03-07 LAB — POCT INR: INR: 2.6 (ref 2.0–3.0)

## 2019-03-07 MED ORDER — TORSEMIDE 20 MG PO TABS: ORAL_TABLET | ORAL | 2 refills | Status: DC

## 2019-03-07 NOTE — Telephone Encounter (Signed)
Pt aware no change in medication & to recheck in 4 wks. She had a hard time hearing Monia Pouch earlier.

## 2019-03-07 NOTE — Telephone Encounter (Signed)
Fax received mdINR PT/INR self testing service Test date/time 03/03/19 9:52 am INR 2.6

## 2019-03-07 NOTE — Progress Notes (Signed)
Telephone anticoagulation encounter. Pt aware INR is at goal and to continue current therapy. Recheck INR in 4 weeks.

## 2019-03-07 NOTE — Telephone Encounter (Signed)
INR at goal, no change in therapy. Recheck INR in 4 weeks. Attempted to call pt, not sure she understood what I was telling her.

## 2019-03-07 NOTE — Telephone Encounter (Signed)
Prescription sent

## 2019-03-07 NOTE — Telephone Encounter (Signed)
Pharmacy states that patient occasionally has to take an extra fluid pill.  Pharmacy would like a new rx of torsemide 20mg  1-2 tablets daily.

## 2019-03-07 NOTE — Telephone Encounter (Signed)
INR checked since this encounter.  This encounter will now be closed.

## 2019-03-07 NOTE — Telephone Encounter (Signed)
Okay to go ahead and write the torsemide 20 mg 1 to 2 tablets daily for the patient, give her the same number and same refills. Caryl Pina, MD [facility] 03/07/2019, 11:56 AM

## 2019-03-12 ENCOUNTER — Telehealth: Payer: Self-pay | Admitting: Family Medicine

## 2019-03-12 MED ORDER — TORSEMIDE 20 MG PO TABS: ORAL_TABLET | ORAL | 2 refills | Status: DC

## 2019-03-12 NOTE — Telephone Encounter (Signed)
done

## 2019-03-14 ENCOUNTER — Telehealth: Payer: Self-pay | Admitting: *Deleted

## 2019-03-14 NOTE — Telephone Encounter (Signed)
Patients daughter in law notified and verbalized understanding.

## 2019-03-14 NOTE — Telephone Encounter (Signed)
Fax received mdINR PT/INR self testing service Test date/time 03/10/19 11:16 am INR 2.4

## 2019-03-14 NOTE — Telephone Encounter (Signed)
Description   Continue taking  5mg  daily except on Tuesday takes 7.5mg   inr 2.4( goal 2.0-3.0) looks great Recheck in 4 weeks     Caryl Pina, MD [facility] 03/14/2019, 12:17 PM

## 2019-03-21 ENCOUNTER — Telehealth: Payer: Self-pay | Admitting: Family Medicine

## 2019-03-21 ENCOUNTER — Other Ambulatory Visit: Payer: Self-pay | Admitting: Family Medicine

## 2019-03-21 NOTE — Telephone Encounter (Signed)
Description   Continue taking  5mg  daily except on Tuesday takes 7.5mg   inr 2.4( goal 2.0-3.0) looks great Recheck in 4 weeks     Caryl Pina, MD [facility] 03/14/2019, 12:17 PM  Last note from [place] on 03/14/2019

## 2019-03-21 NOTE — Telephone Encounter (Signed)
Carol aware  

## 2019-03-21 NOTE — Telephone Encounter (Signed)
FYI, Myra from [facility] called about patient's Coumadin.  Patient receives meds in bubble pack.  They have been packaging with 1-1/2 tabs on Monday, Wednesday and Friday, and one tab all other days. They have now corrected that and have packaged as 1-1/2 tabs on Tuesday, one all other days.  They were concerned that possibly patient has been taking Coumadin incorrectly because of their packaging.  I contacted patient's daughter-in-law to check and she reports they have been taking correctly, have taken the extra 1/2 tab out of Monday, Wednesday and Friday, and added 1/2 tab on Tuesdays.  Patient's current instruction 1-1/2 tabs on Tuesdays, one tab all other days.

## 2019-03-22 ENCOUNTER — Telehealth: Payer: Self-pay | Admitting: Family Medicine

## 2019-03-22 ENCOUNTER — Ambulatory Visit: Payer: Self-pay | Admitting: Family Medicine

## 2019-03-22 LAB — POCT INR: INR: 2.4 (ref 2.0–3.0)

## 2019-03-22 NOTE — Telephone Encounter (Signed)
Melinda Collins was called and aware of this

## 2019-03-22 NOTE — Telephone Encounter (Signed)
Fax received mdINR PT/INR self testing service Test date/time09/01/20 10:20AM INR 2.4      ROUTE BACK TO POOLS

## 2019-03-22 NOTE — Progress Notes (Signed)
INR 2.4, at goal. Continue current regimen. 7.5 mg on Tuesday and 5 mg all other days.

## 2019-03-22 NOTE — Telephone Encounter (Signed)
INR 2.4, at goal. Continue current regimen. 7.5 mg on Tuesday and 5 mg all other days.

## 2019-03-29 ENCOUNTER — Telehealth: Payer: Self-pay | Admitting: *Deleted

## 2019-03-29 NOTE — Telephone Encounter (Signed)
Fax received mdINR PT/INR self testing service Test date/time 03/24/19 1:46 pm INR 2.1

## 2019-03-29 NOTE — Telephone Encounter (Signed)
Pt aware.

## 2019-03-29 NOTE — Telephone Encounter (Signed)
Continue current coumadin dosing. Re-check in 4 weeks.

## 2019-04-01 ENCOUNTER — Ambulatory Visit: Payer: Self-pay | Admitting: Family Medicine

## 2019-04-01 ENCOUNTER — Telehealth: Payer: Self-pay | Admitting: *Deleted

## 2019-04-01 LAB — POCT INR: INR: 2.7 (ref 2.0–3.0)

## 2019-04-01 NOTE — Telephone Encounter (Signed)
Fax received mdINR PT/INR self testing service Test date/time 03/30/19 10:42 am INR 2.7

## 2019-04-01 NOTE — Telephone Encounter (Signed)
INR at goal, no change in therapy.

## 2019-04-01 NOTE — Telephone Encounter (Signed)
Left detailed message on daughters voicemail.

## 2019-04-01 NOTE — Progress Notes (Signed)
INR at goal, no change in therapy, repeat INR in 4 weeks.

## 2019-04-05 ENCOUNTER — Other Ambulatory Visit: Payer: Self-pay | Admitting: Family Medicine

## 2019-04-17 ENCOUNTER — Other Ambulatory Visit: Payer: Self-pay | Admitting: Family Medicine

## 2019-04-18 NOTE — Telephone Encounter (Signed)
Last cholesterol level 03/2018

## 2019-04-24 ENCOUNTER — Telehealth: Payer: Self-pay | Admitting: *Deleted

## 2019-04-24 ENCOUNTER — Ambulatory Visit: Payer: Self-pay | Admitting: Family Medicine

## 2019-04-24 LAB — POCT INR: INR: 2 (ref 2.0–3.0)

## 2019-04-24 NOTE — Telephone Encounter (Signed)
Patients daughter in law notified and verbalized understanding

## 2019-04-24 NOTE — Telephone Encounter (Signed)
Continue taking  5mg  daily except on Tuesday takes 7.5mg   inr 2.0 ( goal 2.0-3.0) looks great Recheck in 4 weeks

## 2019-04-24 NOTE — Telephone Encounter (Signed)
Fax received mdINR PT/INR self testing service Test date/time 04/21/19 3:08 pm INR 2.0

## 2019-04-24 NOTE — Progress Notes (Signed)
Continue taking  5mg  daily except on Tuesday takes 7.5mg   inr 2.0 ( goal 2.0-3.0) looks great Recheck in 4 weeks

## 2019-04-26 ENCOUNTER — Other Ambulatory Visit: Payer: Self-pay | Admitting: Family Medicine

## 2019-05-01 ENCOUNTER — Ambulatory Visit: Payer: Self-pay | Admitting: Family Medicine

## 2019-05-01 ENCOUNTER — Telehealth: Payer: Self-pay | Admitting: *Deleted

## 2019-05-01 LAB — POCT INR: INR: 2.4 (ref 2.0–3.0)

## 2019-05-01 NOTE — Telephone Encounter (Signed)
Fax received mdINR PT/INR self testing service Test date/time 04/27/19 9:27 am INR 2.4

## 2019-05-01 NOTE — Progress Notes (Signed)
INR at goal, no change in Regimen.

## 2019-05-01 NOTE — Telephone Encounter (Signed)
INR at goal. No change in therapy. Repeat INR in 4 weeks.

## 2019-05-01 NOTE — Telephone Encounter (Signed)
Patient aware of results and verbalizes understanding.  

## 2019-05-06 ENCOUNTER — Ambulatory Visit: Payer: Self-pay | Admitting: Family Medicine

## 2019-05-06 ENCOUNTER — Telehealth: Payer: Self-pay | Admitting: *Deleted

## 2019-05-06 LAB — POCT INR: INR: 2.8 (ref 2.0–3.0)

## 2019-05-06 NOTE — Progress Notes (Signed)
Continue taking  5mg  daily except on Tuesday takes 7.5mg   inr 2.8 ( goal 2.0-3.0) looks great Recheck in 4 weeks

## 2019-05-06 NOTE — Telephone Encounter (Signed)
Continue taking  5mg  daily except on Tuesday takes 7.5mg   inr 2.8 ( goal 2.0-3.0) looks great Recheck in 4 weeks

## 2019-05-06 NOTE — Telephone Encounter (Signed)
Fax received mdINR PT/INR self testing service Test date/time 05/05/19 11:18 am INR 2.8

## 2019-05-06 NOTE — Telephone Encounter (Signed)
Pt aware.

## 2019-05-13 ENCOUNTER — Telehealth: Payer: Self-pay | Admitting: *Deleted

## 2019-05-13 NOTE — Telephone Encounter (Signed)
Fax received mdINR PT/INR self testing service Test date/time 05/12/19 10:18 am INR 2.8

## 2019-05-13 NOTE — Telephone Encounter (Signed)
Melinda Collins notified and verbalized understanding

## 2019-05-13 NOTE — Telephone Encounter (Signed)
Continue coumadin as is °

## 2019-05-15 ENCOUNTER — Other Ambulatory Visit: Payer: Self-pay | Admitting: Family Medicine

## 2019-05-16 MED ORDER — ATORVASTATIN CALCIUM 80 MG PO TABS: ORAL_TABLET | ORAL | 0 refills | Status: DC

## 2019-05-16 MED ORDER — DULOXETINE HCL 60 MG PO CPEP: ORAL_CAPSULE | ORAL | 0 refills | Status: DC

## 2019-05-16 NOTE — Addendum Note (Signed)
Addended by: Antonietta Barcelona D on: 05/16/2019 02:52 PM   Modules accepted: Orders

## 2019-05-20 ENCOUNTER — Ambulatory Visit (INDEPENDENT_AMBULATORY_CARE_PROVIDER_SITE_OTHER): Payer: Medicare Other | Admitting: Family Medicine

## 2019-05-20 ENCOUNTER — Other Ambulatory Visit: Payer: Self-pay

## 2019-05-20 ENCOUNTER — Encounter: Payer: Self-pay | Admitting: Family Medicine

## 2019-05-20 VITALS — BP 110/72 | HR 94 | Temp 96.9°F | Resp 20 | Ht 64.0 in | Wt 166.0 lb

## 2019-05-20 DIAGNOSIS — Z23 Encounter for immunization: Secondary | ICD-10-CM | POA: Diagnosis not present

## 2019-05-20 DIAGNOSIS — E559 Vitamin D deficiency, unspecified: Secondary | ICD-10-CM | POA: Diagnosis not present

## 2019-05-20 DIAGNOSIS — I700 Atherosclerosis of aorta: Secondary | ICD-10-CM

## 2019-05-20 DIAGNOSIS — I4821 Permanent atrial fibrillation: Secondary | ICD-10-CM

## 2019-05-20 DIAGNOSIS — Z872 Personal history of diseases of the skin and subcutaneous tissue: Secondary | ICD-10-CM

## 2019-05-20 DIAGNOSIS — F339 Major depressive disorder, recurrent, unspecified: Secondary | ICD-10-CM

## 2019-05-20 DIAGNOSIS — E7800 Pure hypercholesterolemia, unspecified: Secondary | ICD-10-CM

## 2019-05-20 DIAGNOSIS — M545 Low back pain: Secondary | ICD-10-CM

## 2019-05-20 DIAGNOSIS — J439 Emphysema, unspecified: Secondary | ICD-10-CM

## 2019-05-20 DIAGNOSIS — I10 Essential (primary) hypertension: Secondary | ICD-10-CM

## 2019-05-20 DIAGNOSIS — G8929 Other chronic pain: Secondary | ICD-10-CM

## 2019-05-20 MED ORDER — INCRUSE ELLIPTA 62.5 MCG/INH IN AEPB: 1.0000 | INHALATION_SPRAY | Freq: Every day | RESPIRATORY_TRACT | 6 refills | Status: DC

## 2019-05-20 MED ORDER — DULOXETINE HCL 60 MG PO CPEP: 120.0000 mg | ORAL_CAPSULE | Freq: Every day | ORAL | 3 refills | Status: DC

## 2019-05-20 MED ORDER — FLUTICASONE PROPIONATE 50 MCG/ACT NA SUSP: NASAL | 2 refills | Status: DC

## 2019-05-20 MED ORDER — BREO ELLIPTA 100-25 MCG/INH IN AEPB: 1.0000 | INHALATION_SPRAY | Freq: Every day | RESPIRATORY_TRACT | 11 refills | Status: DC

## 2019-05-20 MED ORDER — DICLOFENAC SODIUM 1 % TD GEL: 2.0000 g | Freq: Four times a day (QID) | TRANSDERMAL | 3 refills | Status: DC

## 2019-05-20 MED ORDER — NYSTATIN 100000 UNIT/GM EX CREA: 1.0000 "application " | TOPICAL_CREAM | Freq: Two times a day (BID) | CUTANEOUS | 6 refills | Status: DC

## 2019-05-20 NOTE — Patient Instructions (Signed)
If your symptoms worsen or you have thoughts of suicide/homicide, PLEASE SEEK IMMEDIATE MEDICAL ATTENTION.  You may always call the National Suicide Hotline.  This is available 24 hours a day, 7 days a week.  Their number is: 1-800-273-8255  Taking the medicine as directed and not missing any doses is one of the best things you can do to treat your depression.  Here are some things to keep in mind:  1) Side effects (stomach upset, some increased anxiety) may happen before you notice a benefit.  These side effects typically go away over time. 2) Changes to your dose of medicine or a change in medication all together is sometimes necessary 3) Most people need to be on medication at least 12 months 4) Many people will notice an improvement within two weeks but the full effect of the medication can take up to 4-6 weeks 5) Stopping the medication when you start feeling better often results in a return of symptoms 6) Never discontinue your medication without contacting a health care professional first.  Some medications require gradual discontinuation/ taper and can make you sick if you stop them abruptly.  If your symptoms worsen or you have thoughts of suicide/homicide, PLEASE SEEK IMMEDIATE MEDICAL ATTENTION.  You may always call:  National Suicide Hotline: 800-273-8255  Crisis Line: 336-832-9700 Crisis Recovery in [place]: 800-939-5911   These are available 24 hours a day, 7 days a week.   

## 2019-05-20 NOTE — Progress Notes (Signed)
Subjective:  Patient ID: Melinda Collins, female    DOB: [birth year], 78 y.o.   MRN: [id number]  Patient Care Team: Baruch Gouty, FNP as PCP - General (Family Medicine)   Chief Complaint:  Medical Management of Chronic Issues (4 mo ), Hyperlipidemia, Hypertension, and Atrial Fibrillation   HPI: Melinda Collins is a 78 y.o. female presenting on 05/20/2019 for Medical Management of Chronic Issues (4 mo ), Hyperlipidemia, Hypertension, and Atrial Fibrillation   HPI   1. Depression Reports feeling increased depression symptoms for a few months. Feels down and sad most days of the week. Cries often. Feels like her family would be better off if she was dead, because they wouldn't have to take care of her. Denies SI or HI. She has been taking Cymbalta 60 mg daily for years.   2. Chronic low back pain Followed by pain clinic for chronic back pain. Has had surgery and imaging in the past. Pain is moderate most days. Some relief from pain medication prescribed by pain clinic. Does have trouble standing in shower to bathe and is at risk for falling in the shower.   3. Pulmonary emphysema Currently only using albuterol inhaler 2-3x a day. Has been out of Breo Ellipta and Incruse Ellipta for a few months. Does get SOB with walking. Reports relief with albuterol.   4. History of dermatitis Reports frequent yeast rash under breasts bilaterally. Denies current rash. Denies spreading or drainage.   5. A. Fib Compliant with coumadin. Denies dizziness or palpitations.   6. Hypertension Complaint with meds - Yes Current Medications - amlodipine, torsemide, cardizem Checking BP at home: hasn't been checking at home Exercising Regularly - No Watching Salt intake - Yes Pertinent ROS:  Headache - No Fatigue - yes, exertional, no change Visual Disturbances - No Chest pain - No Dyspnea - Yes, exertional, no change Palpitations - No LE edema - No They report good compliance with  medications and can restate their regimen by memory. No medication side effects.  Family, social, and smoking history reviewed.   BP Readings from Last 3 Encounters:  05/20/19 110/72  08/14/18 (!) 142/82  03/26/18 121/78   CMP Latest Ref Rng & Units 03/26/2018 11/08/2017 06/15/2017  Glucose 65 - 99 mg/dL 145(H) 119(H) 140(H)  BUN 8 - 27 mg/dL 28(H) 16 18  Creatinine 0.57 - 1.00 mg/dL 1.74(H) 1.35(H) 1.43(H)  Sodium 134 - 144 mmol/L 143 142 144  Potassium 3.5 - 5.2 mmol/L 4.1 3.8 3.9  Chloride 96 - 106 mmol/L 108(H) 103 101  CO2 20 - 29 mmol/L 19(L) 28 28  Calcium 8.7 - 10.3 mg/dL 8.7 8.8 9.1  Total Protein 6.0 - 8.5 g/dL 6.0 5.7(L) 6.1  Total Bilirubin 0.0 - 1.2 mg/dL 0.4 0.4 0.3  Alkaline Phos 39 - 117 IU/L 121(H) 129(H) 119(H)  AST 0 - 40 IU/L _0 ALT 0 - 32 IU/L _1 Relevant past medical, surgical, family, and social history reviewed and updated as indicated.  Allergies and medications reviewed and updated. Date reviewed: Chart in Epic.   Past Medical History:  Diagnosis Date  . Arthritis   . COPD (chronic obstructive pulmonary disease) (HCC)    spirometry 04/20/10 FEV1 1.38 (58%) with ration 70  . Depression   . Diastolic dysfunction    noted on echo w/some apparent volume issues in past but not severe   . HLD (hyperlipidemia)   . HOH (hard of  hearing)   . HTN (hypertension)    mild; pulmonary  . Permanent atrial fibrillation ([place])   . Ruptured disk   . Tobacco abuse   . Uterine cancer [facility])     Past Surgical History:  Procedure Laterality Date  . ABDOMINAL HYSTERECTOMY     total  . CATARACT EXTRACTION W/PHACO Left 08/25/2014   Procedure: CATARACT EXTRACTION PHACO AND INTRAOCULAR LENS PLACEMENT (IOC);  Surgeon: Tonny Branch, MD;  Location: [facility];  Service: Ophthalmology;  Laterality: Left;  CDE 6.95  . CATARACT EXTRACTION W/PHACO Right 09/11/2014   Procedure: CATARACT EXTRACTION PHACO AND INTRAOCULAR LENS PLACEMENT (IOC);  Surgeon: Tonny Branch, MD;   Location: [facility];  Service: Ophthalmology;  Laterality: Right;  CDE:8.11  . CERVICAL DISC SURGERY     x2  . KYPHOPLASTY    . SPINE SURGERY  2013   cervical fusion    Social History   Socioeconomic History  . Marital status: Married    Spouse name: Not on file  . Number of children: Not on file  . Years of education: Not on file  . Highest education level: Not on file  Occupational History  . Not on file  Social Needs  . Financial resource strain: Not on file  . Food insecurity    Worry: Not on file    Inability: Not on file  . Transportation needs    Medical: Not on file    Non-medical: Not on file  Tobacco Use  . Smoking status: Current Every Day Smoker    Packs/day: 1.00    Types: Cigarettes    Start date: 07/18/1960  . Smokeless tobacco: Never Used  . Tobacco comment: Qut last week.  08/10/16 restarted smoking cigarettes  Substance and Sexual Activity  . Alcohol use: No    Alcohol/week: 0.0 standard drinks  . Drug use: No  . Sexual activity: Never    Birth control/protection: Surgical  Lifestyle  . Physical activity    Days per week: Not on file    Minutes per session: Not on file  . Stress: Not on file  Relationships  . Social Herbalist on phone: Not on file    Gets together: Not on file    Attends religious service: Not on file    Active member of club or organization: Not on file    Attends meetings of clubs or organizations: Not on file    Relationship status: Not on file  . Intimate partner violence    Fear of current or ex partner: Not on file    Emotionally abused: Not on file    Physically abused: Not on file    Forced sexual activity: Not on file  Other Topics Concern  . Not on file  Social History Narrative   Single, children; disabled.     Outpatient Encounter Medications as of 05/20/2019  Medication Sig  . albuterol (PROVENTIL) (2.5 MG/3ML) 0.083% nebulizer solution Take 2.5 mg by nebulization every 6 (six) hours as needed for  wheezing or shortness of breath.  Marland Kitchen albuterol (VENTOLIN HFA) 108 (90 Base) MCG/ACT inhaler INHALE 1-2 PUFFS EVERY 4 TO 6 HOURS AS NEEDED FOR SHORTNESS OF BREATH  . amLODipine (NORVASC) 5 MG tablet TAKE ONE TABLET BY MOUTH ONCE DAILY (MORNING)  . atorvastatin (LIPITOR) 80 MG tablet TAKE ONE TABLET BY MOUTH DAILY AT 6 PM. (,EVENING)  . BREO ELLIPTA 100-25 MCG/INH AEPB Inhale 1 puff into the lungs daily.  . D3-1000 25 MCG (1000 UT)  capsule TAKE ONE TABLET BY MOUTH DAILY WITH BREAKFAST.  Marland Kitchen diltiazem (CARDIZEM) 30 MG tablet TAKE ONE TABLET BY MOUTH EVERY MORNING AND EVERY EVENING (MORNING ,EVENING)  . fluticasone (FLONASE) 50 MCG/ACT nasal spray USE ONE SPRAY IN EACH NOSTRIL ONCE DAILY.  SHAKE GENTLY BEFORE USING.  Marland Kitchen guaiFENesin (MUCINEX) 600 MG 12 hr tablet Take 1 tablet (600 mg total) by mouth 2 (two) times daily as needed.  . INCRUSE ELLIPTA 62.5 MCG/INH AEPB Inhale 1 puff into the lungs daily.  Marland Kitchen loratadine (CLARITIN) 10 MG tablet TAKE ONE TABLET BY MOUTH EVERY DAY (,EVENING)  . Multiple Vitamins-Minerals (CENTRUM SILVER PO) Take 1 tablet by mouth daily.    . NON FORMULARY Oxygen 3L/min   . oxyCODONE-acetaminophen (PERCOCET) 5-325 MG per tablet Take 2 tablets by mouth every 8 (eight) hours as needed.    . potassium chloride (K-DUR) 10 MEQ tablet TAKE ONE TABLET BY MOUTH EVERY MORNING  . torsemide (DEMADEX) 20 MG tablet TAKE ONE & ONE-HALF TO TWO TABLETS BY MOUTH EVERY MORNING.(Needs to be seen before next refill)  . vitamin B-12 (CYANOCOBALAMIN) 1000 MCG tablet TAKE ONE TABLET BY MOUTH DAILY WITH BREAKFAST.  Marland Kitchen warfarin (COUMADIN) 5 MG tablet TAKE 1 OR 2 TABLETS BY MOUTH AS DIRECTED BY WARFARIN CLINIC. (,EVENING)  . [DISCONTINUED] BREO ELLIPTA 100-25 MCG/INH AEPB Take 1 puff by mouth daily.  . [DISCONTINUED] DULoxetine (CYMBALTA) 60 MG capsule TAKE ONE CAPSULE BY MOUTH DAILY. (,EVENING)  . [DISCONTINUED] esomeprazole (NEXIUM) 40 MG capsule TAKE ONE CAPSULE BY MOUTH DAILY AT 12 NOON. (MORNING-PER  PT)  . [DISCONTINUED] fluticasone (FLONASE) 50 MCG/ACT nasal spray USE ONE SPRAY IN EACH NOSTRIL ONCE DAILY.  SHAKE GENTLY BEFORE USING.  . [DISCONTINUED] INCRUSE ELLIPTA 62.5 MCG/INH AEPB Take 1 puff by mouth daily.  . diclofenac sodium (VOLTAREN) 1 % GEL Apply 2 g topically 4 (four) times daily.  . DULoxetine (CYMBALTA) 60 MG capsule Take 2 capsules (120 mg total) by mouth daily.  Marland Kitchen nystatin cream (MYCOSTATIN) Apply 1 application topically 2 (two) times daily.   No facility-administered encounter medications on file as of 05/20/2019.     Allergies  Allergen Reactions  . Niaspan [Niacin]     Review of Systems  Constitutional: Positive for fatigue (exertional, baseline). Negative for activity change, chills, diaphoresis, fever and unexpected weight change.  HENT: Negative for trouble swallowing.   Eyes: Negative for visual disturbance.  Respiratory: Positive for shortness of breath (exertional, baseline).   Cardiovascular: Negative for chest pain, palpitations and leg swelling.  Gastrointestinal: Negative for abdominal pain, nausea and vomiting.  Genitourinary: Negative for decreased urine volume and difficulty urinating.  Musculoskeletal: Positive for back pain and gait problem (uses walker). Negative for myalgias.  Neurological: Negative for dizziness, syncope, facial asymmetry, light-headedness and headaches.  Hematological: Does not bruise/bleed easily.  Psychiatric/Behavioral: Positive for dysphoric mood. Negative for confusion, self-injury, sleep disturbance and suicidal ideas.        Objective:  BP 110/72   Pulse 94   Temp (!) 96.9 F (36.1 C)   Resp 20   Ht _0  (1.626 m)   Wt 166 lb (75.3 kg)   SpO2 94%   BMI 28.49 kg/m    Wt Readings from Last 3 Encounters:  05/20/19 166 lb (75.3 kg)  08/14/18 155 lb (70.3 kg)  03/26/18 150 lb (68 kg)   PHQ9 SCORE ONLY 05/20/2019 05/20/2019 08/14/2018  Score _1 Physical Exam Vitals signs and nursing note reviewed.   Constitutional:  General: She is not in acute distress.    Appearance: Normal appearance. She is not ill-appearing, toxic-appearing or diaphoretic.  HENT:     Head: Normocephalic and atraumatic.  Eyes:     Extraocular Movements: Extraocular movements intact.     Conjunctiva/sclera: Conjunctivae normal.     Pupils: Pupils are equal, round, and reactive to light.  Neck:     Musculoskeletal: Neck supple. No muscular tenderness.     Vascular: No carotid bruit.  Cardiovascular:     Rate and Rhythm: Normal rate and regular rhythm.     Pulses: Normal pulses.     Heart sounds: Normal heart sounds. No murmur.  Pulmonary:     Effort: Pulmonary effort is normal. No respiratory distress.     Breath sounds: Normal breath sounds.  Abdominal:     General: Bowel sounds are normal.     Palpations: Abdomen is soft.     Tenderness: There is no abdominal tenderness.  Musculoskeletal:        General: Tenderness (lower back) present. No signs of injury.     Right lower leg: No edema.     Left lower leg: No edema.  Skin:    General: Skin is warm and dry.     Capillary Refill: Capillary refill takes 2 to 3 seconds.  Neurological:     Mental Status: She is alert and oriented to person, place, and time. Mental status is at baseline.     Gait: Gait abnormal (uses walker).  Psychiatric:        Mood and Affect: Mood normal.        Behavior: Behavior normal.        Thought Content: Thought content normal.        Judgment: Judgment normal.     Results for orders placed or performed in visit on 05/06/19  POCT INR  Result Value Ref Range   INR 2.8 2.0 - 3.0       Pertinent labs & imaging results that were available during my care of the patient were reviewed by me and considered in my medical decision making.  Assessment & Plan:  Melinda Collins was seen today for medical management of chronic issues, hyperlipidemia, hypertension and atrial fibrillation.  Diagnoses and all orders for this visit:   Pure hypercholesterolemia Diet encouraged - increase intake of fresh fruits and vegetables, increase intake of lean proteins. Bake, broil, or grill foods. Avoid fried, greasy, and fatty foods. Avoid fast foods. Increase intake of fiber-rich whole grains. Exercise encouraged - at least 150 minutes per week and advance as tolerated.  Goal BMI < 25. Continue medications as prescribed. Follow up in 3-6 months as discussed.  -     CMP14+EGFR -     Lipid panel  Permanent atrial fibrillation (HCC) Continue medications as prescribed. Notify provider for new or worsening symptoms. Labs pending as below.  -     CBC with Differential/Platelet  Essential hypertension BP well controlled. Changes were not made in regimen today. Pt aware to report any persistent high or low readings. DASH diet and exercise encouraged. Stress management encouraged. Avoid nicotine and tobacco product use. Avoid excessive alcohol and NSAID's. Avoid more than 2000 mg of sodium daily. Medications as prescribed. Follow up as scheduled. Labs pending as below. -     CMP14+EGFR -     CBC with Differential/Platelet -     Thyroid Panel With TSH  Vitamin D deficiency Labs pending. Continue repletion therapy. If indicated, will change repletion  dosage. Eat foods rich in Vit D including milk, orange juice, yogurt with vitamin D added, salmon or mackerel, canned tuna fish, cereals with vitamin D added, and cod liver oil. Get out in the sun but make sure to wear at least SPF 30 sunscreen.  -     CBC with Differential/Platelet -     VITAMIN D 25 Hydroxy (Vit-D Deficiency, Fractures)  Aortic atherosclerosis ([place]) On statin therapy.   Chronic low back pain, unspecified back pain laterality, unspecified whether sciatica present Medications as below. Continue to follow up with pain clinic. Notify provider for new or worsening symptoms.  -     diclofenac sodium (VOLTAREN) 1 % GEL; Apply 2 g topically 4 (four) times daily. -     DULoxetine  (CYMBALTA) 60 MG capsule; Take 2 capsules (120 mg total) by mouth daily.  Depression, recurrent ([place]) Increase Cymbalta dosage to 120 mg. Thyroid panel pending. Patient given handout on depression with hotline contact information. -     DULoxetine (CYMBALTA) 60 MG capsule; Take 2 capsules (120 mg total) by mouth daily. -     Thyroid Panel With TSH  Pulmonary emphysema, unspecified emphysema type ([place]) Refill prescriptions as below.  -     BREO ELLIPTA 100-25 MCG/INH AEPB; Inhale 1 puff into the lungs daily. -     fluticasone (FLONASE) 50 MCG/ACT nasal spray; USE ONE SPRAY IN EACH NOSTRIL ONCE DAILY.  SHAKE GENTLY BEFORE USING. -     INCRUSE ELLIPTA 62.5 MCG/INH AEPB; Inhale 1 puff into the lungs daily.  History of dermatitis Nystatin cream to rash under breasts. Notify provider for any new or worsening symptoms.  -     nystatin cream (MYCOSTATIN); Apply 1 application topically 2 (two) times daily.  Need for vaccination -     Pneumococcal polysaccharide vaccine 23-valent greater than or equal to 2yo subcutaneous/IM   Continue all other maintenance medications.  Follow up plan: Return in 2 weeks (on 06/03/2019), or if symptoms worsen or fail to improve, for depression .  Continue healthy lifestyle choices, including diet (rich in fruits, vegetables, and lean proteins, and low in salt and simple carbohydrates) and exercise (at least 30 minutes of moderate physical activity daily).  Educational handout given for depression, suicide hotline information.  The above assessment and management plan was discussed with the patient. The patient verbalized understanding of and has agreed to the management plan. Patient is aware to call the clinic if they develop any new symptoms or if symptoms persist or worsen. Patient is aware when to return to the clinic for a follow-up visit. Patient educated on when it is appropriate to go to the emergency department.   Marjorie Smolder, RN, BSN, FNP student  [facility] [phone]   I personally was present during the history, physical exam, and medical decision-making activities of this service and have verified that the service and findings are accurately documented in the nurse practitioner student's note.  Monia Pouch, FNP-C [facility] [address] [place], [place] [address] [phone]

## 2019-05-24 ENCOUNTER — Other Ambulatory Visit: Payer: Self-pay | Admitting: Family Medicine

## 2019-05-24 ENCOUNTER — Ambulatory Visit: Payer: Self-pay | Admitting: Family Medicine

## 2019-05-24 ENCOUNTER — Telehealth: Payer: Self-pay | Admitting: *Deleted

## 2019-05-24 LAB — POCT INR: INR: 2.6 (ref 2.0–3.0)

## 2019-05-24 NOTE — Telephone Encounter (Signed)
lmtcb

## 2019-05-24 NOTE — Telephone Encounter (Signed)
Fax received [facility]INR PT/INR self testing service Test date/time 04/19/19 1:24 pm INR 2.6

## 2019-05-24 NOTE — Telephone Encounter (Signed)
Continue taking  5mg  daily except on Tuesday takes 7.5mg   inr 2.6 ( goal 2.0-3.0) looks great Recheck in 4 weeks

## 2019-05-24 NOTE — Progress Notes (Signed)
Continue taking  5mg  daily except on Tuesday takes 7.5mg   inr 2.6 ( goal 2.0-3.0) looks great Recheck in 4 weeks

## 2019-05-30 ENCOUNTER — Telehealth: Payer: Self-pay | Admitting: *Deleted

## 2019-05-30 ENCOUNTER — Ambulatory Visit (INDEPENDENT_AMBULATORY_CARE_PROVIDER_SITE_OTHER): Payer: Self-pay | Admitting: Family Medicine

## 2019-05-30 LAB — POCT INR: INR: 2.7 (ref 2.0–3.0)

## 2019-05-30 NOTE — Progress Notes (Signed)
Continue taking  5mg  daily except on Tuesday takes 7.5mg   INR 2.7, ( goal 2.0-3.0) looks great Recheck in 4 weeks

## 2019-05-30 NOTE — Telephone Encounter (Signed)
Continue taking  5mg  daily except on Tuesday takes 7.5mg   INR at goal, no change.

## 2019-05-30 NOTE — Telephone Encounter (Signed)
Fax received mdINR PT/INR self testing service Test date/time 05/26/19 11:06 am INR 2.7

## 2019-05-30 NOTE — Telephone Encounter (Signed)
Aware and verbalizes understanding.  

## 2019-06-04 NOTE — Telephone Encounter (Signed)
Aware. 

## 2019-06-05 ENCOUNTER — Telehealth: Payer: Self-pay | Admitting: *Deleted

## 2019-06-05 ENCOUNTER — Ambulatory Visit: Payer: Self-pay | Admitting: Family Medicine

## 2019-06-05 LAB — POCT INR: INR: 2.2 (ref 2.0–3.0)

## 2019-06-05 NOTE — Progress Notes (Signed)
INR 2.2, ( goal 2.0-3.0) looks great  Continue taking  5mg  daily except on Tuesday takes 7.5mg   Recheck in 4 weeks

## 2019-06-05 NOTE — Telephone Encounter (Signed)
INR 2.2, ( goal 2.0-3.0) looks great  Continue taking  5mg  daily except on Tuesday takes 7.5mg   Recheck in 4 weeks

## 2019-06-05 NOTE — Telephone Encounter (Signed)
Patient aware.

## 2019-06-05 NOTE — Telephone Encounter (Signed)
Fax received mdINR PT/INR self testing service Test date/time 06/04/19 2:37 pm INR 2.2

## 2019-06-07 ENCOUNTER — Other Ambulatory Visit: Payer: Self-pay

## 2019-06-10 ENCOUNTER — Ambulatory Visit: Payer: Medicare Other | Admitting: Family Medicine

## 2019-06-11 ENCOUNTER — Other Ambulatory Visit: Payer: Self-pay | Admitting: Family Medicine

## 2019-06-11 ENCOUNTER — Telehealth: Payer: Self-pay | Admitting: Family Medicine

## 2019-06-11 NOTE — Telephone Encounter (Signed)
PCS form filled out & placed on providers desk

## 2019-06-17 ENCOUNTER — Telehealth: Payer: Self-pay | Admitting: *Deleted

## 2019-06-17 ENCOUNTER — Ambulatory Visit: Payer: Self-pay | Admitting: Family Medicine

## 2019-06-17 DIAGNOSIS — I4821 Permanent atrial fibrillation: Secondary | ICD-10-CM | POA: Diagnosis not present

## 2019-06-17 LAB — POCT INR: INR: 2.6 (ref 2.0–3.0)

## 2019-06-17 NOTE — Progress Notes (Signed)
INR 2.6, ( goal 2.0-3.0) looks great  Continue taking  5mg  daily except on Tuesday takes 7.5mg   Recheck in 4 weeks

## 2019-06-17 NOTE — Telephone Encounter (Signed)
Fax received mdINR PT/INR self testing service Test date/time 06/15/19 11:44 am INR 2.6

## 2019-06-17 NOTE — Telephone Encounter (Signed)
Patient notified and verbalized understanding. 

## 2019-06-17 NOTE — Telephone Encounter (Signed)
INR 2.6, ( goal 2.0-3.0) looks great  Continue taking  5mg  daily except on Tuesday takes 7.5mg   Recheck in 4 weeks

## 2019-06-18 ENCOUNTER — Other Ambulatory Visit: Payer: Medicare Other

## 2019-06-18 ENCOUNTER — Other Ambulatory Visit: Payer: Self-pay

## 2019-06-18 NOTE — Telephone Encounter (Signed)
PCS form faxed to [facility]

## 2019-06-19 LAB — CBC WITH DIFFERENTIAL/PLATELET
Basophils Absolute: 0.1 10*3/uL (ref 0.0–0.2)
Basos: 1 %
EOS (ABSOLUTE): 0.1 10*3/uL (ref 0.0–0.4)
Eos: 2 %
Hematocrit: 46.1 % (ref 34.0–46.6)
Hemoglobin: 15 g/dL (ref 11.1–15.9)
Immature Grans (Abs): 0 10*3/uL (ref 0.0–0.1)
Immature Granulocytes: 0 %
Lymphocytes Absolute: 1 10*3/uL (ref 0.7–3.1)
Lymphs: 13 %
MCH: 29.8 pg (ref 26.6–33.0)
MCHC: 32.5 g/dL (ref 31.5–35.7)
MCV: 92 fL (ref 79–97)
Monocytes Absolute: 0.5 10*3/uL (ref 0.1–0.9)
Monocytes: 6 %
Neutrophils Absolute: 6.5 10*3/uL (ref 1.4–7.0)
Neutrophils: 78 %
Platelets: 197 10*3/uL (ref 150–450)
RBC: 5.03 x10E6/uL (ref 3.77–5.28)
RDW: 14.5 % (ref 11.7–15.4)
WBC: 8.2 10*3/uL (ref 3.4–10.8)

## 2019-06-19 LAB — CMP14+EGFR
ALT: 17 IU/L (ref 0–32)
AST: 19 IU/L (ref 0–40)
Albumin/Globulin Ratio: 2.1 (ref 1.2–2.2)
Albumin: 4 g/dL (ref 3.7–4.7)
Alkaline Phosphatase: 115 IU/L (ref 39–117)
BUN/Creatinine Ratio: 18 (ref 12–28)
BUN: 30 mg/dL — ABNORMAL HIGH (ref 8–27)
Bilirubin Total: 0.5 mg/dL (ref 0.0–1.2)
CO2: 21 mmol/L (ref 20–29)
Calcium: 8.4 mg/dL — ABNORMAL LOW (ref 8.7–10.3)
Chloride: 108 mmol/L — ABNORMAL HIGH (ref 96–106)
Creatinine, Ser: 1.69 mg/dL — ABNORMAL HIGH (ref 0.57–1.00)
GFR calc Af Amer: 34 mL/min/{1.73_m2} — ABNORMAL LOW (ref >59)
GFR calc non Af Amer: 29 mL/min/{1.73_m2} — ABNORMAL LOW (ref >59)
Globulin, Total: 1.9 g/dL (ref 1.5–4.5)
Glucose: 129 mg/dL — ABNORMAL HIGH (ref 65–99)
Potassium: 4.7 mmol/L (ref 3.5–5.2)
Sodium: 143 mmol/L (ref 134–144)
Total Protein: 5.9 g/dL — ABNORMAL LOW (ref 6.0–8.5)

## 2019-06-19 LAB — LIPID PANEL
Chol/HDL Ratio: 2.6 ratio (ref 0.0–4.4)
Cholesterol, Total: 154 mg/dL (ref 100–199)
HDL: 59 mg/dL (ref >39)
LDL Chol Calc (NIH): 64 mg/dL (ref 0–99)
Triglycerides: 191 mg/dL — ABNORMAL HIGH (ref 0–149)
VLDL Cholesterol Cal: 31 mg/dL (ref 5–40)

## 2019-06-19 LAB — THYROID PANEL WITH TSH
Free Thyroxine Index: 2 (ref 1.2–4.9)
T3 Uptake Ratio: 28 % (ref 24–39)
T4, Total: 7.3 ug/dL (ref 4.5–12.0)
TSH: 0.752 u[IU]/mL (ref 0.450–4.500)

## 2019-06-19 LAB — VITAMIN D 25 HYDROXY (VIT D DEFICIENCY, FRACTURES): Vit D, 25-Hydroxy: 23.6 ng/mL — ABNORMAL LOW (ref 30.0–100.0)

## 2019-06-21 ENCOUNTER — Encounter: Payer: Self-pay | Admitting: Family Medicine

## 2019-06-21 ENCOUNTER — Telehealth: Payer: Self-pay | Admitting: Family Medicine

## 2019-06-21 ENCOUNTER — Ambulatory Visit (INDEPENDENT_AMBULATORY_CARE_PROVIDER_SITE_OTHER): Payer: Medicare Other | Admitting: Family Medicine

## 2019-06-21 DIAGNOSIS — E559 Vitamin D deficiency, unspecified: Secondary | ICD-10-CM | POA: Diagnosis not present

## 2019-06-21 DIAGNOSIS — E782 Mixed hyperlipidemia: Secondary | ICD-10-CM

## 2019-06-21 DIAGNOSIS — M545 Low back pain, unspecified: Secondary | ICD-10-CM

## 2019-06-21 DIAGNOSIS — M4716 Other spondylosis with myelopathy, lumbar region: Secondary | ICD-10-CM

## 2019-06-21 DIAGNOSIS — F339 Major depressive disorder, recurrent, unspecified: Secondary | ICD-10-CM | POA: Diagnosis not present

## 2019-06-21 DIAGNOSIS — G8929 Other chronic pain: Secondary | ICD-10-CM

## 2019-06-21 DIAGNOSIS — I4821 Permanent atrial fibrillation: Secondary | ICD-10-CM | POA: Diagnosis not present

## 2019-06-21 MED ORDER — VITAMIN D (ERGOCALCIFEROL) 1.25 MG (50000 UNIT) PO CAPS: 50000.0000 [IU] | ORAL_CAPSULE | ORAL | 11 refills | Status: DC

## 2019-06-21 MED ORDER — DULOXETINE HCL 60 MG PO CPEP: 120.0000 mg | ORAL_CAPSULE | Freq: Every day | ORAL | 3 refills | Status: DC

## 2019-06-21 NOTE — Progress Notes (Signed)
Virtual Visit via telephone Note Due to COVID-19 pandemic this visit was conducted virtually. This visit type was conducted due to national recommendations for restrictions regarding the COVID-19 Pandemic (e.g. social distancing, sheltering in place) in an effort to limit this patient's exposure and mitigate transmission in our community. All issues noted in this document were discussed and addressed.  A physical exam was not performed with this format.   I connected with [place] on 06/21/2019 at 1300 by telephone and verified that I am speaking with the correct person using two identifiers. Melinda Collins is currently located at home and family is currently with them during visit. The provider, Monia Pouch, FNP is located in their office at time of visit.  I discussed the limitations, risks, security and privacy concerns of performing an evaluation and management service by telephone and the availability of in person appointments. I also discussed with the patient that there may be a patient responsible charge related to this service. The patient expressed understanding and agreed to proceed.  Subjective:  Patient ID: Melinda Collins, female    DOB: 1941-06-04, 78 y.o.   MRN: [id number]  Chief Complaint:  Depression   HPI: Melinda Collins is a 78 y.o. female presenting on 06/21/2019 for Depression   Depression        This is a recurrent problem.  The problem occurs daily.  The problem has been gradually improving since onset.  Associated symptoms include fatigue, helplessness, hopelessness, decreased interest, body aches, myalgias and sad.  Associated symptoms include no decreased concentration, does not have insomnia, not irritable, no restlessness, no appetite change, no headaches, no indigestion and no suicidal ideas.  Past treatments include other medications.  Compliance with treatment is good.  Previous treatment provided moderate relief.  Past medical history  includes chronic pain.   Hyperlipidemia This is a chronic problem. The current episode started more than 1 year ago. The problem is controlled. Recent lipid tests were reviewed and are high. Associated symptoms include myalgias. Pertinent negatives include no chest pain, leg pain or shortness of breath. Current antihyperlipidemic treatment includes statins. The current treatment provides significant improvement of lipids.  Back Pain This is a chronic problem. The current episode started more than 1 year ago. The problem occurs daily. The pain is present in the lumbar spine. The quality of the pain is described as aching. The pain is mild. The pain is worse during the day. Pertinent negatives include no abdominal pain, bladder incontinence, bowel incontinence, chest pain, dysuria, fever, headaches, leg pain, numbness, paresis, paresthesias, pelvic pain, perianal numbness, tingling, weakness or weight loss. She has tried analgesics for the symptoms. The treatment provided moderate relief.   Depression screen [facility] 2/9 06/21/2019 05/20/2019 05/20/2019 08/14/2018 03/26/2018  Decreased Interest 1 0 1 1 2   Down, Depressed, Hopeless 1 3 2  0 2  PHQ - 2 Score 2 3 3 1 4   Altered sleeping 1 0 0 - 2  Tired, decreased energy 1 3 0 - 3  Change in appetite 0 3 0 - 2  Feeling bad or failure about yourself  0 3 0 - 1  Trouble concentrating 0 0 0 - 1  Moving slowly or fidgety/restless 0 0 0 - 0  Suicidal thoughts 0 2 0 - 1  PHQ-9 Score 4 14 3  - 14  Difficult doing work/chores - Somewhat difficult Somewhat difficult - Somewhat difficult  Some recent data might be hidden     Relevant past medical,  surgical, family, and social history reviewed and updated as indicated.  Allergies and medications reviewed and updated.   Past Medical History:  Diagnosis Date  . Arthritis   . COPD (chronic obstructive pulmonary disease) (HCC)    spirometry 04/20/10 FEV1 1.38 (58%) with ration 70  . Depression   . Diastolic  dysfunction    noted on echo w/some apparent volume issues in past but not severe   . HLD (hyperlipidemia)   . HOH (hard of hearing)   . HTN (hypertension)    mild; pulmonary  . Permanent atrial fibrillation ([place])   . Ruptured disk   . Tobacco abuse   . Uterine cancer [facility])     Past Surgical History:  Procedure Laterality Date  . ABDOMINAL HYSTERECTOMY     total  . CATARACT EXTRACTION W/PHACO Left 08/25/2014   Procedure: CATARACT EXTRACTION PHACO AND INTRAOCULAR LENS PLACEMENT (IOC);  Surgeon: Tonny Branch, MD;  Location: [facility];  Service: Ophthalmology;  Laterality: Left;  CDE 6.95  . CATARACT EXTRACTION W/PHACO Right 09/11/2014   Procedure: CATARACT EXTRACTION PHACO AND INTRAOCULAR LENS PLACEMENT (IOC);  Surgeon: Tonny Branch, MD;  Location: [facility];  Service: Ophthalmology;  Laterality: Right;  CDE:8.11  . CERVICAL DISC SURGERY     x2  . KYPHOPLASTY    . SPINE SURGERY  2013   cervical fusion    Social History   Socioeconomic History  . Marital status: Married    Spouse name: Not on file  . Number of children: Not on file  . Years of education: Not on file  . Highest education level: Not on file  Occupational History  . Not on file  Social Needs  . Financial resource strain: Not on file  . Food insecurity    Worry: Not on file    Inability: Not on file  . Transportation needs    Medical: Not on file    Non-medical: Not on file  Tobacco Use  . Smoking status: Current Every Day Smoker    Packs/day: 1.00    Types: Cigarettes    Start date: 07/18/1960  . Smokeless tobacco: Never Used  . Tobacco comment: Qut last week.  08/10/16 restarted smoking cigarettes  Substance and Sexual Activity  . Alcohol use: No    Alcohol/week: 0.0 standard drinks  . Drug use: No  . Sexual activity: Never    Birth control/protection: Surgical  Lifestyle  . Physical activity    Days per week: Not on file    Minutes per session: Not on file  . Stress: Not on file  Relationships  . Social  Herbalist on phone: Not on file    Gets together: Not on file    Attends religious service: Not on file    Active member of club or organization: Not on file    Attends meetings of clubs or organizations: Not on file    Relationship status: Not on file  . Intimate partner violence    Fear of current or ex partner: Not on file    Emotionally abused: Not on file    Physically abused: Not on file    Forced sexual activity: Not on file  Other Topics Concern  . Not on file  Social History Narrative   Single, children; disabled.     Outpatient Encounter Medications as of 06/21/2019  Medication Sig  . albuterol (PROVENTIL) (2.5 MG/3ML) 0.083% nebulizer solution Take 2.5 mg by nebulization every 6 (six) hours as needed for  wheezing or shortness of breath.  Marland Kitchen albuterol (VENTOLIN HFA) 108 (90 Base) MCG/ACT inhaler INHALE 1-2 PUFFS EVERY 4 TO 6 HOURS AS NEEDED FOR SHORTNESS OF BREATH  . amLODipine (NORVASC) 5 MG tablet TAKE ONE TABLET BY MOUTH ONCE DAILY (MORNING)  . atorvastatin (LIPITOR) 80 MG tablet TAKE ONE TABLET BY MOUTH DAILY AT 6 PM. (,EVENING)  . BREO ELLIPTA 100-25 MCG/INH AEPB Inhale 1 puff into the lungs daily.  . D3-1000 25 MCG (1000 UT) capsule TAKE ONE TABLET BY MOUTH DAILY WITH BREAKFAST.  Marland Kitchen diclofenac sodium (VOLTAREN) 1 % GEL Apply 2 g topically 4 (four) times daily.  Marland Kitchen diltiazem (CARDIZEM) 30 MG tablet TAKE ONE TABLET BY MOUTH EVERY MORNING AND EVERY EVENING (MORNING ,EVENING)  . DULoxetine (CYMBALTA) 60 MG capsule Take 2 capsules (120 mg total) by mouth daily.  . fluticasone (FLONASE) 50 MCG/ACT nasal spray USE ONE SPRAY IN EACH NOSTRIL ONCE DAILY.  SHAKE GENTLY BEFORE USING.  Marland Kitchen guaiFENesin (MUCINEX) 600 MG 12 hr tablet Take 1 tablet (600 mg total) by mouth 2 (two) times daily as needed.  . INCRUSE ELLIPTA 62.5 MCG/INH AEPB Inhale 1 puff into the lungs daily.  Marland Kitchen loratadine (CLARITIN) 10 MG tablet TAKE ONE TABLET BY MOUTH EVERY DAY (,EVENING)  . Multiple  Vitamins-Minerals (CENTRUM SILVER PO) Take 1 tablet by mouth daily.    . NON FORMULARY Oxygen 3L/min   . nystatin cream (MYCOSTATIN) Apply 1 application topically 2 (two) times daily.  Marland Kitchen oxyCODONE-acetaminophen (PERCOCET) 5-325 MG per tablet Take 2 tablets by mouth every 8 (eight) hours as needed.    . potassium chloride (K-DUR) 10 MEQ tablet TAKE ONE TABLET BY MOUTH EVERY MORNING  . torsemide (DEMADEX) 20 MG tablet TAKE ONE AND ONE-HALF TO TWO TABLETS BY MOUTH EVERY MORNING (MORNING)  . vitamin B-12 (CYANOCOBALAMIN) 1000 MCG tablet TAKE ONE TABLET BY MOUTH DAILY WITH BREAKFAST.  Marland Kitchen Vitamin D, Ergocalciferol, (DRISDOL) 1.25 MG (50000 UT) CAPS capsule Take 1 capsule (50,000 Units total) by mouth every 7 (seven) days.  Marland Kitchen warfarin (COUMADIN) 5 MG tablet TAKE 1 OR 2 TABLETS BY MOUTH AS DIRECTED BY WARFARIN CLINIC. (,EVENING)  . [DISCONTINUED] DULoxetine (CYMBALTA) 60 MG capsule Take 2 capsules (120 mg total) by mouth daily.   No facility-administered encounter medications on file as of 06/21/2019.     Allergies  Allergen Reactions  . Niaspan [Niacin]     Review of Systems  Constitutional: Positive for fatigue. Negative for activity change, appetite change, chills, diaphoresis, fever, unexpected weight change and weight loss.  HENT: Negative.   Eyes: Negative.  Negative for photophobia, redness and visual disturbance.  Respiratory: Negative for cough, chest tightness and shortness of breath.   Cardiovascular: Negative for chest pain, palpitations and leg swelling.  Gastrointestinal: Negative for abdominal pain, blood in stool, bowel incontinence, constipation, diarrhea, nausea and vomiting.  Endocrine: Negative.  Negative for cold intolerance, heat intolerance, polydipsia, polyphagia and polyuria.  Genitourinary: Negative for bladder incontinence, decreased urine volume, difficulty urinating, dysuria, frequency, pelvic pain and urgency.  Musculoskeletal: Positive for arthralgias, back pain, gait  problem and myalgias.  Skin: Negative.   Allergic/Immunologic: Negative.   Neurological: Negative for dizziness, tingling, weakness, numbness, headaches and paresthesias.  Hematological: Negative.   Psychiatric/Behavioral: Positive for depression and sleep disturbance. Negative for confusion, decreased concentration, hallucinations and suicidal ideas. The patient is nervous/anxious. The patient does not have insomnia.   All other systems reviewed and are negative.        Observations/Objective: No vital signs or  physical exam, this was a telephone or virtual health encounter.  Pt alert and oriented, answers all questions appropriately, and able to speak in full sentences.    Assessment and Plan: Melinda was seen today for depression.  Diagnoses and all orders for this visit:  Depression, recurrent ([place]) Doing well with increased dose of Cymbalta. Will continue.  -     DULoxetine (CYMBALTA) 60 MG capsule; Take 2 capsules (120 mg total) by mouth daily.  Vitamin D deficiency Vit D low. Will initiate weekly repletion therapy.  -     Vitamin D, Ergocalciferol, (DRISDOL) 1.25 MG (50000 UT) CAPS capsule; Take 1 capsule (50,000 Units total) by mouth every 7 (seven) days.  Mixed hyperlipidemia Triglycerides were elevated . Pt aware to continue medications. Will repeat labs in 6-9 months.   Permanent atrial fibrillation (HCC) Well controlled with current medications.   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 Will continue.  -     DULoxetine (CYMBALTA) 60 MG capsule; Take 2 capsules (120 mg total) by mouth daily.     Follow Up Instructions: Return in about 3 months (around 09/19/2019), or if symptoms worsen or fail to improve.    I discussed the assessment and treatment plan with the patient. The patient was provided an opportunity to ask questions and all were answered. The patient agreed with the plan and  demonstrated an understanding of the instructions.   The patient was advised to call back or seek an in-person evaluation if the symptoms worsen or if the condition fails to improve as anticipated.  The above assessment and management plan was discussed with the patient. The patient verbalized understanding of and has agreed to the management plan. Patient is aware to call the clinic if they develop any new symptoms or if symptoms persist or worsen. Patient is aware when to return to the clinic for a follow-up visit. Patient educated on when it is appropriate to go to the emergency department.    I provided 25 minutes of non-face-to-face time during this encounter. The call started at 1300. The call ended at 1325. The other time was used for coordination of care.    Monia Pouch, FNP-C [facility] [address] [place], [place] [address] [phone] 06/21/2019

## 2019-06-27 NOTE — Telephone Encounter (Signed)
[facility] called.

## 2019-07-02 ENCOUNTER — Telehealth: Payer: Self-pay | Admitting: *Deleted

## 2019-07-02 NOTE — Telephone Encounter (Signed)
Fax received mdINR PT/INR self testing service Test date/time 07/01/19 1:49 pm INR 2.5

## 2019-07-03 ENCOUNTER — Ambulatory Visit: Payer: Self-pay | Admitting: Family Medicine

## 2019-07-03 LAB — POCT INR: INR: 2.5 (ref 2.0–3.0)

## 2019-07-03 NOTE — Progress Notes (Signed)
INR 2.5, ( goal 2.0-3.0) looks great  Continue taking  5mg  daily except on Tuesday takes 7.5mg   Recheck in 4 weeks

## 2019-07-03 NOTE — Telephone Encounter (Signed)
INR 2.5, ( goal 2.0-3.0) looks great  Continue taking  5mg  daily except on Tuesday takes 7.5mg   Recheck in 4 weeks

## 2019-07-03 NOTE — Telephone Encounter (Signed)
lmtcb

## 2019-07-04 NOTE — Telephone Encounter (Signed)
Patient aware and verbalized understanding. °

## 2019-07-09 ENCOUNTER — Telehealth: Payer: Self-pay | Admitting: *Deleted

## 2019-07-09 ENCOUNTER — Other Ambulatory Visit: Payer: Self-pay | Admitting: Family Medicine

## 2019-07-09 LAB — POCT INR: INR: 2.4 (ref 2.0–3.0)

## 2019-07-09 NOTE — Telephone Encounter (Signed)
Fax received mdINR PT/INR self testing service Test date/time 07/07/19 10:32am INR 2.4

## 2019-07-09 NOTE — Telephone Encounter (Signed)
INR 2.4, ( goal 2.0-3.0) looks great  Continue taking  5mg  daily except on Tuesday takes 7.5mg   Recheck in 4 weeks

## 2019-07-09 NOTE — Telephone Encounter (Signed)
Pt aware by detailed VM 

## 2019-07-09 NOTE — Progress Notes (Signed)
INR 2.4, ( goal 2.0-3.0) looks great  Continue taking  5mg  daily except on Tuesday takes 7.5mg   Recheck in 4 weeks

## 2019-07-16 ENCOUNTER — Other Ambulatory Visit: Payer: Self-pay | Admitting: Family Medicine

## 2019-07-16 ENCOUNTER — Telehealth: Payer: Self-pay | Admitting: *Deleted

## 2019-07-16 ENCOUNTER — Telehealth: Payer: Self-pay | Admitting: Family Medicine

## 2019-07-16 NOTE — Telephone Encounter (Signed)
Called - TELE 12/30

## 2019-07-16 NOTE — Telephone Encounter (Signed)
Pt called in - states having cough, ST  She is aware we can do a TELE tomorrow   Aware to try OTC cough syrup and mucinex today

## 2019-07-17 ENCOUNTER — Ambulatory Visit (INDEPENDENT_AMBULATORY_CARE_PROVIDER_SITE_OTHER): Payer: Medicare Other | Admitting: Family Medicine

## 2019-07-17 ENCOUNTER — Telehealth: Payer: Self-pay | Admitting: *Deleted

## 2019-07-17 ENCOUNTER — Encounter: Payer: Self-pay | Admitting: Family Medicine

## 2019-07-17 ENCOUNTER — Ambulatory Visit: Payer: Self-pay | Admitting: Family Medicine

## 2019-07-17 DIAGNOSIS — R0602 Shortness of breath: Secondary | ICD-10-CM | POA: Diagnosis not present

## 2019-07-17 DIAGNOSIS — R062 Wheezing: Secondary | ICD-10-CM

## 2019-07-17 DIAGNOSIS — J189 Pneumonia, unspecified organism: Secondary | ICD-10-CM

## 2019-07-17 DIAGNOSIS — R531 Weakness: Secondary | ICD-10-CM | POA: Diagnosis not present

## 2019-07-17 DIAGNOSIS — R509 Fever, unspecified: Secondary | ICD-10-CM

## 2019-07-17 LAB — POCT INR: INR: 2.3 (ref 2.0–3.0)

## 2019-07-17 MED ORDER — PREDNISONE 20 MG PO TABS: ORAL_TABLET | ORAL | 0 refills | Status: DC

## 2019-07-17 MED ORDER — LEVOFLOXACIN 500 MG PO TABS: 500.0000 mg | ORAL_TABLET | Freq: Every day | ORAL | 0 refills | Status: AC

## 2019-07-17 NOTE — Progress Notes (Signed)
INR 2.3, ( goal 2.0-3.0) looks great  Continue taking  5mg  daily except on Tuesday takes 7.5mg   Recheck in 4 weeks

## 2019-07-17 NOTE — Telephone Encounter (Signed)
Lmtcb.

## 2019-07-17 NOTE — Telephone Encounter (Signed)
Fax received mdINR PT/INR self testing service Test date/time 07/15/19 10:09 am INR 2.3

## 2019-07-17 NOTE — Telephone Encounter (Signed)
INR 2.3, ( goal 2.0-3.0) looks great  Continue taking  5mg  daily except on Tuesday takes 7.5mg   Recheck in 4 weeks

## 2019-07-17 NOTE — Progress Notes (Signed)
Virtual Visit via telephone Note Due to COVID-19 pandemic this visit was conducted virtually. This visit type was conducted due to national recommendations for restrictions regarding the COVID-19 Pandemic (e.g. social distancing, sheltering in place) in an effort to limit this patient's exposure and mitigate transmission in our community. All issues noted in this document were discussed and addressed.  A physical exam was not performed with this format.   I connected with Melinda Collins on 07/17/2019 at 1205 by telephone and verified that I am speaking with the correct person using two identifiers. Melinda Collins is currently located at home and no one is currently with them during visit. The provider, Monia Pouch, FNP is located in their office at time of visit.  I discussed the limitations, risks, security and privacy concerns of performing an evaluation and management service by telephone and the availability of in person appointments. I also discussed with the patient that there may be a patient responsible charge related to this service. The patient expressed understanding and agreed to proceed.  Subjective:  Patient ID: Melinda Collins, female    DOB: 1941-05-17, 78 y.o.   MRN: [id number]  Chief Complaint:  Shortness of Breath   HPI: Melinda Collins is a 78 y.o. female presenting on 07/17/2019 for Shortness of Breath   Pt reports increased shortness of breath, wheezing, general weakness, and increased cough. States she has had fever and chills but denies today. States she has been using her inhalers and oxygen more over the last few days. States she has been feeling bad for at least a week. States she started Mucinex yesterday and it has helped some with the cough and sputum production. States her sputum is brown colored. States her throat hurts from coughing.   Shortness of Breath This is a new problem. The current episode started 1 to 4 weeks ago. The problem occurs  daily. The problem has been gradually worsening. Associated symptoms include a fever, a sore throat, sputum production and wheezing. Pertinent negatives include no abdominal pain, chest pain, headaches, leg swelling, rhinorrhea or vomiting. The symptoms are aggravated by any activity. She has tried OTC cough suppressants and beta agonist inhalers for the symptoms. The treatment provided mild relief.     Relevant past medical, surgical, family, and social history reviewed and updated as indicated.  Allergies and medications reviewed and updated.   Past Medical History:  Diagnosis Date  . Arthritis   . COPD (chronic obstructive pulmonary disease) (HCC)    spirometry 04/20/10 FEV1 1.38 (58%) with ration 70  . Depression   . Diastolic dysfunction    noted on echo w/some apparent volume issues in past but not severe   . HLD (hyperlipidemia)   . HOH (hard of hearing)   . HTN (hypertension)    mild; pulmonary  . Permanent atrial fibrillation ([place])   . Ruptured disk   . Tobacco abuse   . Uterine cancer [facility])     Past Surgical History:  Procedure Laterality Date  . ABDOMINAL HYSTERECTOMY     total  . CATARACT EXTRACTION W/PHACO Left 08/25/2014   Procedure: CATARACT EXTRACTION PHACO AND INTRAOCULAR LENS PLACEMENT (IOC);  Surgeon: Tonny Branch, MD;  Location: [facility];  Service: Ophthalmology;  Laterality: Left;  CDE 6.95  . CATARACT EXTRACTION W/PHACO Right 09/11/2014   Procedure: CATARACT EXTRACTION PHACO AND INTRAOCULAR LENS PLACEMENT (IOC);  Surgeon: Tonny Branch, MD;  Location: [facility];  Service: Ophthalmology;  Laterality: Right;  CDE:8.11  . CERVICAL  [place] SURGERY     x2  . KYPHOPLASTY    . SPINE SURGERY  2013   cervical fusion    Social History   Socioeconomic History  . Marital status: Married    Spouse name: Not on file  . Number of children: Not on file  . Years of education: Not on file  . Highest education level: Not on file  Occupational History  . Not on file  Tobacco Use    . Smoking status: Current Every Day Smoker    Packs/day: 1.00    Types: Cigarettes    Start date: 07/18/1960  . Smokeless tobacco: Never Used  . Tobacco comment: Qut last week.  08/10/16 restarted smoking cigarettes  Substance and Sexual Activity  . Alcohol use: No    Alcohol/week: 0.0 standard drinks  . Drug use: No  . Sexual activity: Never    Birth control/protection: Surgical  Other Topics Concern  . Not on file  Social History Narrative   Single, children; disabled.    Social Determinants of Health   Financial Resource Strain:   . Difficulty of Paying Living Expenses: Not on file  Food Insecurity:   . Worried About Charity fundraiser in the Last Year: Not on file  . Ran Out of Food in the Last Year: Not on file  Transportation Needs:   . Lack of Transportation (Medical): Not on file  . Lack of Transportation (Non-Medical): Not on file  Physical Activity:   . Days of Exercise per Week: Not on file  . Minutes of Exercise per Session: Not on file  Stress:   . Feeling of Stress : Not on file  Social Connections:   . Frequency of Communication with Friends and Family: Not on file  . Frequency of Social Gatherings with Friends and Family: Not on file  . Attends Religious Services: Not on file  . Active Member of Clubs or Organizations: Not on file  . Attends Archivist Meetings: Not on file  . Marital Status: Not on file  Intimate Partner Violence:   . Fear of Current or Ex-Partner: Not on file  . Emotionally Abused: Not on file  . Physically Abused: Not on file  . Sexually Abused: Not on file    Outpatient Encounter Medications as of 07/17/2019  Medication Sig  . albuterol (PROVENTIL) (2.5 MG/3ML) 0.083% nebulizer solution Take 2.5 mg by nebulization every 6 (six) hours as needed for wheezing or shortness of breath.  Marland Kitchen albuterol (VENTOLIN HFA) 108 (90 Base) MCG/ACT inhaler INHALE 1-2 PUFFS EVERY 4 TO 6 HOURS AS NEEDED FOR SHORTNESS OF BREATH  . amLODipine  (NORVASC) 5 MG tablet TAKE ONE TABLET BY MOUTH ONCE DAILY (MORNING)  . atorvastatin (LIPITOR) 80 MG tablet TAKE ONE TABLET BY MOUTH DAILY AT 6 PM. (,EVENING)  . baclofen (LIORESAL) 10 MG tablet Take 10 mg by mouth 2 (two) times daily as needed.  Marland Kitchen BREO ELLIPTA 100-25 MCG/INH AEPB Inhale 1 puff into the lungs daily.  . D3-1000 25 MCG (1000 UT) capsule TAKE ONE TABLET BY MOUTH DAILY WITH BREAKFAST.  Marland Kitchen diclofenac sodium (VOLTAREN) 1 % GEL Apply 2 g topically 4 (four) times daily.  Marland Kitchen diltiazem (CARDIZEM) 30 MG tablet TAKE ONE TABLET BY MOUTH EVERY MORNING AND EVERY EVENING (MORNING ,EVENING)  . DULoxetine (CYMBALTA) 60 MG capsule Take 2 capsules (120 mg total) by mouth daily.  Marland Kitchen esomeprazole (NEXIUM) 40 MG capsule Take 40 mg by mouth daily.  . fluticasone (FLONASE) 50  MCG/ACT nasal spray USE ONE SPRAY IN EACH NOSTRIL ONCE DAILY.  SHAKE GENTLY BEFORE USING.  Marland Kitchen guaiFENesin (MUCINEX) 600 MG 12 hr tablet Take 1 tablet (600 mg total) by mouth 2 (two) times daily as needed.  . INCRUSE ELLIPTA 62.5 MCG/INH AEPB Inhale 1 puff into the lungs daily.  Marland Kitchen levofloxacin (LEVAQUIN) 500 MG tablet Take 1 tablet (500 mg total) by mouth daily for 7 days.  Marland Kitchen loratadine (CLARITIN) 10 MG tablet TAKE ONE TABLET BY MOUTH EVERY DAY (,EVENING)  . Multiple Vitamins-Minerals (CENTRUM SILVER PO) Take 1 tablet by mouth daily.    . NON FORMULARY Oxygen 3L/min   . nystatin cream (MYCOSTATIN) Apply 1 application topically 2 (two) times daily.  Marland Kitchen oxyCODONE-acetaminophen (PERCOCET) 5-325 MG per tablet Take 2 tablets by mouth every 8 (eight) hours as needed.    . potassium chloride (K-DUR) 10 MEQ tablet TAKE ONE TABLET BY MOUTH EVERY MORNING  . predniSONE (DELTASONE) 20 MG tablet 2 po at sametime daily for 5 days  . torsemide (DEMADEX) 20 MG tablet TAKE ONE AND ONE-HALF TO TWO TABLETS BY MOUTH EVERY MORNING (MORNING)  . vitamin B-12 (CYANOCOBALAMIN) 1000 MCG tablet TAKE ONE TABLET BY MOUTH DAILY WITH BREAKFAST.  Marland Kitchen Vitamin D,  Ergocalciferol, (DRISDOL) 1.25 MG (50000 UT) CAPS capsule Take 1 capsule (50,000 Units total) by mouth every 7 (seven) days.  Marland Kitchen warfarin (COUMADIN) 5 MG tablet TAKE 1 OR 2 TABLETS BY MOUTH AS DIRECTED BY WARFARIN CLINIC. (,EVENING)   No facility-administered encounter medications on file as of 07/17/2019.    Allergies  Allergen Reactions  . Niaspan [Niacin]     Review of Systems  Constitutional: Positive for chills, fatigue and fever. Negative for activity change, appetite change, diaphoresis and unexpected weight change.  HENT: Positive for congestion and sore throat. Negative for postnasal drip and rhinorrhea.   Eyes: Negative.   Respiratory: Positive for cough, sputum production, shortness of breath and wheezing. Negative for chest tightness.   Cardiovascular: Negative for chest pain, palpitations and leg swelling.  Gastrointestinal: Negative for abdominal pain, blood in stool, constipation, diarrhea, nausea and vomiting.  Endocrine: Negative.   Genitourinary: Negative for decreased urine volume, difficulty urinating, dysuria, frequency and urgency.  Musculoskeletal: Negative for myalgias.  Skin: Negative.   Allergic/Immunologic: Negative.   Neurological: Positive for weakness (generalized). Negative for dizziness, tremors, seizures, syncope, facial asymmetry, speech difficulty, light-headedness, numbness and headaches.  Hematological: Negative.   Psychiatric/Behavioral: Negative for confusion, hallucinations, sleep disturbance and suicidal ideas.  All other systems reviewed and are negative.        Observations/Objective: No vital signs or physical exam, this was a telephone or virtual health encounter.  Pt alert and oriented, answers all questions appropriately, and able to speak in full sentences.    Assessment and Plan: Melinda was seen today for shortness of breath.  Diagnoses and all orders for this visit:  Shortness of breath Weakness Fever and  chills Wheezing Community acquired pneumonia, unspecified laterality Symptoms consistent with CAP. Pt aware to continue inhalers and Mucinex as prescribed. Will initiate levaquin and prednisone. Pt aware to check INR more frequently over the next 2 weeks. Pt aware of symptoms that require emergent evaluation. Follow up in 2 weeks or sooner if needed.  -     levofloxacin (LEVAQUIN) 500 MG tablet; Take 1 tablet (500 mg total) by mouth daily for 7 days. -     predniSONE (DELTASONE) 20 MG tablet; 2 po at sametime daily for 5 days  Follow Up Instructions: Return in about 2 weeks (around 07/31/2019), or if symptoms worsen or fail to improve.    I discussed the assessment and treatment plan with the patient. The patient was provided an opportunity to ask questions and all were answered. The patient agreed with the plan and demonstrated an understanding of the instructions.   The patient was advised to call back or seek an in-person evaluation if the symptoms worsen or if the condition fails to improve as anticipated.  The above assessment and management plan was discussed with the patient. The patient verbalized understanding of and has agreed to the management plan. Patient is aware to call the clinic if they develop any new symptoms or if symptoms persist or worsen. Patient is aware when to return to the clinic for a follow-up visit. Patient educated on when it is appropriate to go to the emergency department.    I provided 25 minutes of non-face-to-face time during this encounter. The call started at 1205. The call ended at 1230. The other time was used for coordination of care.    Monia Pouch, FNP-C [facility] [address] [place], [place] [address] [phone] 07/17/2019

## 2019-07-17 NOTE — Telephone Encounter (Signed)
Aware. 

## 2019-07-23 ENCOUNTER — Other Ambulatory Visit: Payer: Self-pay | Admitting: Family Medicine

## 2019-07-24 ENCOUNTER — Ambulatory Visit: Payer: Self-pay | Admitting: Family Medicine

## 2019-07-24 ENCOUNTER — Other Ambulatory Visit: Payer: Self-pay | Admitting: Family Medicine

## 2019-07-24 ENCOUNTER — Telehealth: Payer: Self-pay | Admitting: *Deleted

## 2019-07-24 LAB — POCT INR: INR: 3.1 — AB (ref 2.0–3.0)

## 2019-07-24 NOTE — Telephone Encounter (Signed)
lmtcb

## 2019-07-24 NOTE — Telephone Encounter (Signed)
Fax received mdINR PT/INR self testing service Test date/time 07/22/19 9:34 am INR 3.1

## 2019-07-24 NOTE — Progress Notes (Signed)
INR 3.1, ( goal 2.0-3.0) slightly thick  Continue taking  5mg  daily except on Tuesday take 7.5mg   Recheck in 1 weeks

## 2019-07-24 NOTE — Telephone Encounter (Signed)
INR 3.1, ( goal 2.0-3.0) slightly thick  Continue taking  5mg  daily except on Tuesday take 7.5mg   Recheck in 1 weeks

## 2019-07-30 ENCOUNTER — Telehealth: Payer: Self-pay | Admitting: *Deleted

## 2019-07-30 NOTE — Telephone Encounter (Signed)
Fax received mdINR PT/INR self testing service Test date/time 07/28/19 1:30 pm INR 2.6

## 2019-07-31 NOTE — Telephone Encounter (Signed)
lmtcb

## 2019-07-31 NOTE — Telephone Encounter (Signed)
Continue coumadin as is °

## 2019-08-02 NOTE — Telephone Encounter (Signed)
Pt aware to continue current dose of coumadin °

## 2019-08-07 ENCOUNTER — Ambulatory Visit: Payer: Self-pay | Admitting: Family Medicine

## 2019-08-07 ENCOUNTER — Telehealth: Payer: Self-pay | Admitting: *Deleted

## 2019-08-07 LAB — POCT INR: INR: 2.6 (ref 2.0–3.0)

## 2019-08-07 NOTE — Telephone Encounter (Signed)
INR 2.6, ( goal 2.0-3.0) at goal  Continue taking  5mg  daily except on Tuesday take 7.5mg   Recheck in 4 weeks

## 2019-08-07 NOTE — Progress Notes (Signed)
INR 2.6, ( goal 2.0-3.0) at goal  Continue taking  5mg  daily except on Tuesday take 7.5mg   Recheck in 4 weeks

## 2019-08-07 NOTE — Telephone Encounter (Signed)
Pt aware of provider feedback and voiced understanding. 

## 2019-08-07 NOTE — Telephone Encounter (Signed)
Fax received mdINR PT/INR self testing service Test date/time 08/04/19 2:45 pm INR 2.6

## 2019-08-15 ENCOUNTER — Ambulatory Visit: Payer: Self-pay | Admitting: Family Medicine

## 2019-08-15 ENCOUNTER — Telehealth: Payer: Self-pay | Admitting: *Deleted

## 2019-08-15 LAB — POCT INR: INR: 2.6 (ref 2.0–3.0)

## 2019-08-15 NOTE — Telephone Encounter (Signed)
Pt aware.

## 2019-08-15 NOTE — Telephone Encounter (Signed)
INR 2.6, ( goal 2.0-3.0) at goal  Continue taking  5mg  daily except on Tuesday take 7.5mg   Recheck in 4 weeks

## 2019-08-15 NOTE — Progress Notes (Signed)
INR 2.6, ( goal 2.0-3.0) at goal  Continue taking  5mg  daily except on Tuesday take 7.5mg   Recheck in 4 weeks

## 2019-08-15 NOTE — Telephone Encounter (Signed)
Fax received mdINR PT/INR self testing service Test date/time 08/11/19 10:45 am INR 2.6

## 2019-08-20 ENCOUNTER — Other Ambulatory Visit: Payer: Self-pay | Admitting: Family Medicine

## 2019-08-21 ENCOUNTER — Ambulatory Visit: Payer: Self-pay | Admitting: Family Medicine

## 2019-08-21 ENCOUNTER — Telehealth: Payer: Self-pay | Admitting: *Deleted

## 2019-08-21 LAB — POCT INR: INR: 2.8 (ref 2.0–3.0)

## 2019-08-21 NOTE — Progress Notes (Signed)
INR 2.8, ( goal 2.0-3.0) at goal  Continue taking  5mg  daily except on Tuesday take 7.5mg   Recheck in 4 weeks

## 2019-08-21 NOTE — Telephone Encounter (Signed)
Fax received mdINR PT/INR self testing service Test date/time 08/18/19 2:04 pm INR 2.8

## 2019-08-21 NOTE — Telephone Encounter (Signed)
INR 2.8, ( goal 2.0-3.0) at goal  Continue taking  5mg  daily except on Tuesday take 7.5mg   Recheck in 4 weeks

## 2019-08-21 NOTE — Telephone Encounter (Signed)
DIL aware

## 2019-08-26 ENCOUNTER — Ambulatory Visit: Payer: Self-pay | Admitting: Family Medicine

## 2019-08-26 ENCOUNTER — Telehealth: Payer: Self-pay | Admitting: Family Medicine

## 2019-08-26 LAB — POCT INR: INR: 2.9 (ref 2.0–3.0)

## 2019-08-26 NOTE — Telephone Encounter (Signed)
Fax received mdINR PT/INR self testing service Test date/time 08/25/2019 9:49am INR 2.9

## 2019-08-26 NOTE — Telephone Encounter (Signed)
INR 2.9, ( goal 2.0-3.0) at goal  Continue taking  5mg  daily except on Tuesday take 7.5mg   Recheck in 4 weeks

## 2019-08-26 NOTE — Progress Notes (Signed)
INR 2.9, ( goal 2.0-3.0) at goal  Continue taking  5mg  daily except on Tuesday take 7.5mg   Recheck in 4 weeks

## 2019-08-26 NOTE — Telephone Encounter (Signed)
Patient's daughter in law, [name], is aware of instructions.

## 2019-09-05 ENCOUNTER — Other Ambulatory Visit: Payer: Self-pay | Admitting: Family Medicine

## 2019-09-05 DIAGNOSIS — R062 Wheezing: Secondary | ICD-10-CM

## 2019-09-05 DIAGNOSIS — J189 Pneumonia, unspecified organism: Secondary | ICD-10-CM

## 2019-09-05 DIAGNOSIS — R0602 Shortness of breath: Secondary | ICD-10-CM

## 2019-09-09 ENCOUNTER — Ambulatory Visit: Payer: Self-pay | Admitting: Family Medicine

## 2019-09-09 ENCOUNTER — Telehealth: Payer: Self-pay | Admitting: *Deleted

## 2019-09-09 LAB — POCT INR: INR: 2.8 (ref 2.0–3.0)

## 2019-09-09 NOTE — Telephone Encounter (Signed)
INR 2.8, ( goal 2.0-3.0) at goal  Continue taking  5mg  daily except on Tuesday take 7.5mg   Recheck in 4 weeks

## 2019-09-09 NOTE — Telephone Encounter (Signed)
Patient aware and verbalizes understanding. 

## 2019-09-09 NOTE — Telephone Encounter (Signed)
Fax received mdINR PT/INR self testing service Test date/time 09/08/19 12:55 pm INR 2.8

## 2019-09-09 NOTE — Progress Notes (Signed)
INR 2.8, ( goal 2.0-3.0) at goal  Continue taking  5mg  daily except on Tuesday take 7.5mg   Recheck in 4 weeks

## 2019-09-10 ENCOUNTER — Telehealth: Payer: Self-pay | Admitting: Family Medicine

## 2019-09-10 NOTE — Chronic Care Management (AMB) (Signed)
  Chronic Care Management   Note  09/10/2019 Name: Melinda Collins MRN: [id number] DOB: January 22, 1941  Melinda Collins is a 80 y.o. year old female who is a primary care patient of Baruch Gouty, FNP. I reached out to [name] by phone today in response to a referral sent by [name]'s health plan.     [name] daughter in-law [name]  was given information about Chronic Care Management services today including:  1. CCM service includes personalized support from designated clinical staff supervised by her physician, including individualized plan of care and coordination with other care providers 2. 24/7 contact phone numbers for assistance for urgent and routine care needs. 3. Service will only be billed when office clinical staff spend 20 minutes or more in a month to coordinate care. 4. Only one practitioner may furnish and bill the service in a calendar month. 5. The patient may stop CCM services at any time (effective at the end of the month) by phone call to the office staff. 6. The patient will be responsible for cost sharing (co-pay) of up to 20% of the service fee (after annual deductible is met).  Patient's daughter in-law [name]  agreed to services and verbal consent obtained.   Follow up plan: Telephone appointment with care management team member scheduled for:10/21/2019  Noreene Larsson, [place], [place],  [id number] Direct Dial: [phone] [email or web address] Website: [email or web address]

## 2019-09-12 ENCOUNTER — Other Ambulatory Visit: Payer: Self-pay | Admitting: Family Medicine

## 2019-09-16 ENCOUNTER — Ambulatory Visit: Payer: Self-pay | Admitting: Family Medicine

## 2019-09-16 ENCOUNTER — Telehealth: Payer: Self-pay | Admitting: *Deleted

## 2019-09-16 LAB — POCT INR: INR: 2.4 (ref 2.0–3.0)

## 2019-09-16 NOTE — Telephone Encounter (Signed)
Aware and verbalizes understanding.  

## 2019-09-16 NOTE — Telephone Encounter (Signed)
Fax received mdINR PT/INR self testing service Test date/time 09/14/19 10:04 am INR 2.4

## 2019-09-16 NOTE — Progress Notes (Signed)
INR 2.4, ( goal 2.0-3.0) at goal  Continue taking  5mg  daily except on Tuesday take 7.5mg   Recheck in 4 weeks

## 2019-09-16 NOTE — Telephone Encounter (Signed)
INR 2.4, ( goal 2.0-3.0) at goal  Continue taking  5mg  daily except on Tuesday take 7.5mg   Recheck in 4 weeks

## 2019-09-17 ENCOUNTER — Other Ambulatory Visit: Payer: Self-pay | Admitting: Family Medicine

## 2019-09-26 ENCOUNTER — Telehealth: Payer: Self-pay | Admitting: *Deleted

## 2019-09-26 NOTE — Telephone Encounter (Signed)
Fax received mdINR PT/INR self testing service Test date/time 09/20/19 3:46 am INR 2.6

## 2019-09-27 ENCOUNTER — Ambulatory Visit: Payer: Self-pay | Admitting: Family Medicine

## 2019-09-27 LAB — POCT INR: INR: 2.6 (ref 2.0–3.0)

## 2019-09-27 NOTE — Telephone Encounter (Signed)
Patient aware and verbalized understanding. °

## 2019-09-27 NOTE — Progress Notes (Signed)
INR 2.6, ( goal 2.0-3.0) at goal  Continue taking  5mg  daily except on Tuesday take 7.5mg   Recheck in 4 weeks

## 2019-09-27 NOTE — Telephone Encounter (Signed)
INR 2.6, ( goal 2.0-3.0) at goal  Continue taking  5mg  daily except on Tuesday take 7.5mg   Recheck in 4 weeks

## 2019-10-01 ENCOUNTER — Ambulatory Visit: Payer: Self-pay | Admitting: Family Medicine

## 2019-10-01 ENCOUNTER — Telehealth: Payer: Self-pay | Admitting: *Deleted

## 2019-10-01 LAB — POCT INR: INR: 2.7 (ref 2.0–3.0)

## 2019-10-01 NOTE — Telephone Encounter (Signed)
INR 2.7, ( goal 2.0-3.0) at goal  Continue taking  5mg  daily except on Tuesday take 7.5mg   Recheck in 4 weeks

## 2019-10-01 NOTE — Telephone Encounter (Signed)
Patient aware and verbalized understanding. °

## 2019-10-01 NOTE — Progress Notes (Signed)
INR 2.7, ( goal 2.0-3.0) at goal  Continue taking  5mg  daily except on Tuesday take 7.5mg   Recheck in 4 weeks

## 2019-10-01 NOTE — Telephone Encounter (Signed)
Fax received mdINR PT/INR self testing service Test date/time 09/29/19 1:05 pm INR 2.7

## 2019-10-08 ENCOUNTER — Telehealth: Payer: Self-pay | Admitting: *Deleted

## 2019-10-08 NOTE — Telephone Encounter (Signed)
Continue coumadin as is °

## 2019-10-08 NOTE — Telephone Encounter (Signed)
Fax received mdINR PT/INR self testing service Test date/time 10/07/19 2:36 pm INR 2.9

## 2019-10-08 NOTE — Telephone Encounter (Signed)
Patient aware of results.

## 2019-10-10 ENCOUNTER — Other Ambulatory Visit: Payer: Self-pay | Admitting: Family Medicine

## 2019-10-14 ENCOUNTER — Other Ambulatory Visit: Payer: Self-pay | Admitting: Family Medicine

## 2019-10-16 ENCOUNTER — Other Ambulatory Visit: Payer: Self-pay | Admitting: Family Medicine

## 2019-10-21 ENCOUNTER — Ambulatory Visit: Payer: Medicare Other | Admitting: *Deleted

## 2019-10-21 NOTE — Progress Notes (Signed)
  Chronic Care Management   Outreach Note  10/21/2019 Name: Melinda Collins MRN: [id number] DOB: October 25, 1940  Referred by: Baruch Gouty, FNP Reason for referral : Chronic Care Management (Initial visit)   An unsuccessful Initial Telephone outreach was attempted today. The patient was referred to the case management team for assistance with care management and care coordination.   Follow Up Plan: A HIPPA compliant phone message was left for the patient providing contact information and requesting a return call.   Message will be sent to Care Guides requesting that they contact Ms Ing to reschedule her initial visit.   Chong Sicilian, BSN, RN-BC Embedded Chronic Care Manager [facility] [place] [facility] Direct Dial: [phone]

## 2019-10-25 ENCOUNTER — Ambulatory Visit: Payer: Self-pay | Admitting: Family Medicine

## 2019-10-25 ENCOUNTER — Telehealth: Payer: Self-pay | Admitting: *Deleted

## 2019-10-25 LAB — POCT INR: INR: 2.9 (ref 2.0–3.0)

## 2019-10-25 NOTE — Telephone Encounter (Signed)
INR 2.9, ( goal 2.0-3.0) at goal  Continue taking  5mg  daily except on Tuesday take 7.5mg   Recheck in 4 weeks

## 2019-10-25 NOTE — Progress Notes (Signed)
INR 2.9, ( goal 2.0-3.0) at goal  Continue taking  5mg  daily except on Tuesday take 7.5mg   Recheck in 4 weeks

## 2019-10-25 NOTE — Telephone Encounter (Signed)
Aware and verbalizes understanding.  

## 2019-10-25 NOTE — Telephone Encounter (Signed)
Fax received mdINR PT/INR self testing service Test date/time 10/22/19 915a INR 2.9

## 2019-11-01 ENCOUNTER — Other Ambulatory Visit: Payer: Self-pay | Admitting: Family Medicine

## 2019-11-01 DIAGNOSIS — J439 Emphysema, unspecified: Secondary | ICD-10-CM

## 2019-11-04 ENCOUNTER — Telehealth: Payer: Self-pay | Admitting: *Deleted

## 2019-11-04 NOTE — Telephone Encounter (Signed)
Fax received mdINR PT/INR self testing service Test date/time 11/01/19 855a INR 2.2

## 2019-11-04 NOTE — Telephone Encounter (Signed)
Continue current dose of coumadin.

## 2019-11-05 NOTE — Telephone Encounter (Signed)
LVOVM to continue current dose of coumadin

## 2019-11-12 ENCOUNTER — Other Ambulatory Visit: Payer: Self-pay | Admitting: Family Medicine

## 2019-11-13 ENCOUNTER — Telehealth: Payer: Self-pay | Admitting: *Deleted

## 2019-11-13 NOTE — Telephone Encounter (Signed)
Patient aware and verbalized understanding. °

## 2019-11-13 NOTE — Telephone Encounter (Signed)
Fax received mdINR PT/INR self testing service Test date/time 11/10/19 939am INR 2.9

## 2019-11-13 NOTE — Telephone Encounter (Signed)
Description   INR 2.9, ( goal 2.0-3.0) at goal  Continue taking  5mg  daily except on Tuesday take 7.5mg   Recheck in 4 weeks     Caryl Pina, MD [facility] 11/13/2019, 10:24 AM

## 2019-11-20 ENCOUNTER — Other Ambulatory Visit: Payer: Self-pay | Admitting: *Deleted

## 2019-11-20 ENCOUNTER — Telehealth: Payer: Self-pay | Admitting: *Deleted

## 2019-11-20 MED ORDER — ALBUTEROL SULFATE HFA 108 (90 BASE) MCG/ACT IN AERS: INHALATION_SPRAY | RESPIRATORY_TRACT | 1 refills | Status: DC

## 2019-11-20 NOTE — Telephone Encounter (Signed)
Fax received mdINR PT/INR self testing service Test date/time 11/17/19 1052am INR 3.0

## 2019-11-20 NOTE — Telephone Encounter (Signed)
Called patient, no answer, left message to return call 

## 2019-11-20 NOTE — Telephone Encounter (Signed)
INR therapeutic.  Continue current regimen.  Repeat in 1 week.  Have pt est care.

## 2019-11-21 NOTE — Telephone Encounter (Signed)
Aware of provider's orders and will call back to schedule with new provider.

## 2019-11-27 ENCOUNTER — Telehealth: Payer: Self-pay | Admitting: *Deleted

## 2019-11-27 NOTE — Telephone Encounter (Signed)
Description   INR 3.2, ( goal 2.0-3.0)  Hold today's dose and then continue taking  5mg  daily except on Tuesday take 7.5mg   Recheck in 1-2 weeks    Caryl Pina, MD [facility] 11/27/2019, 3:43 PM

## 2019-11-27 NOTE — Telephone Encounter (Signed)
[name] aware of patients coumadin dosage

## 2019-11-27 NOTE — Telephone Encounter (Signed)
Fax received mdINR PT/INR self testing service Test date/time 11/24/19 951 am INR 3.2

## 2019-12-03 ENCOUNTER — Other Ambulatory Visit: Payer: Self-pay | Admitting: *Deleted

## 2019-12-03 DIAGNOSIS — J439 Emphysema, unspecified: Secondary | ICD-10-CM

## 2019-12-03 MED ORDER — FLUTICASONE PROPIONATE 50 MCG/ACT NA SUSP: NASAL | 1 refills | Status: DC

## 2019-12-10 ENCOUNTER — Telehealth: Payer: Self-pay | Admitting: *Deleted

## 2019-12-10 NOTE — Telephone Encounter (Signed)
INR 3.1 ( goal 2.0-3.0)  Hold today's dose and then decrease to 5mg  daily. Recheck in 1-2 weeks

## 2019-12-10 NOTE — Telephone Encounter (Signed)
Fax received mdINR PT/INR self testing service Test date/time 12/06/19 1005 am INR 3.1

## 2019-12-10 NOTE — Telephone Encounter (Signed)
Left detailed message on daughter in laws voicemail with instructions on coumadin

## 2019-12-17 ENCOUNTER — Telehealth: Payer: Self-pay

## 2019-12-17 NOTE — Telephone Encounter (Signed)
Continue coumadin as is °

## 2019-12-17 NOTE — Telephone Encounter (Signed)
Fax received mdINR PT/INR self testing service Test date/time 12/14/2019 10:54 am INR 2.8  Range 2 - 3

## 2019-12-17 NOTE — Telephone Encounter (Signed)
Patient aware and verbalized understanding. °

## 2019-12-20 ENCOUNTER — Other Ambulatory Visit: Payer: Self-pay

## 2019-12-20 ENCOUNTER — Ambulatory Visit (INDEPENDENT_AMBULATORY_CARE_PROVIDER_SITE_OTHER): Payer: Medicare Other | Admitting: Family

## 2019-12-20 ENCOUNTER — Encounter: Payer: Self-pay | Admitting: Family

## 2019-12-20 VITALS — BP 133/77 | HR 103 | Temp 98.5°F | Ht 64.0 in | Wt 154.4 lb

## 2019-12-20 DIAGNOSIS — M545 Low back pain, unspecified: Secondary | ICD-10-CM

## 2019-12-20 DIAGNOSIS — E559 Vitamin D deficiency, unspecified: Secondary | ICD-10-CM

## 2019-12-20 DIAGNOSIS — Z872 Personal history of diseases of the skin and subcutaneous tissue: Secondary | ICD-10-CM | POA: Diagnosis not present

## 2019-12-20 DIAGNOSIS — I700 Atherosclerosis of aorta: Secondary | ICD-10-CM | POA: Diagnosis not present

## 2019-12-20 DIAGNOSIS — I480 Paroxysmal atrial fibrillation: Secondary | ICD-10-CM | POA: Diagnosis not present

## 2019-12-20 DIAGNOSIS — J439 Emphysema, unspecified: Secondary | ICD-10-CM

## 2019-12-20 DIAGNOSIS — I10 Essential (primary) hypertension: Secondary | ICD-10-CM

## 2019-12-20 DIAGNOSIS — E782 Mixed hyperlipidemia: Secondary | ICD-10-CM

## 2019-12-20 DIAGNOSIS — G8929 Other chronic pain: Secondary | ICD-10-CM

## 2019-12-20 DIAGNOSIS — F17200 Nicotine dependence, unspecified, uncomplicated: Secondary | ICD-10-CM

## 2019-12-20 DIAGNOSIS — F339 Major depressive disorder, recurrent, unspecified: Secondary | ICD-10-CM

## 2019-12-20 DIAGNOSIS — Z79899 Other long term (current) drug therapy: Secondary | ICD-10-CM

## 2019-12-20 DIAGNOSIS — R531 Weakness: Secondary | ICD-10-CM

## 2019-12-20 DIAGNOSIS — J441 Chronic obstructive pulmonary disease with (acute) exacerbation: Secondary | ICD-10-CM

## 2019-12-20 LAB — CBC WITH DIFFERENTIAL/PLATELET
Basophils Absolute: 0.1 10*3/uL (ref 0.0–0.2)
Basos: 1 %
EOS (ABSOLUTE): 0.4 10*3/uL (ref 0.0–0.4)
Eos: 5 %
Hematocrit: 43.2 % (ref 34.0–46.6)
Hemoglobin: 14 g/dL (ref 11.1–15.9)
Immature Grans (Abs): 0 10*3/uL (ref 0.0–0.1)
Immature Granulocytes: 0 %
Lymphocytes Absolute: 1.4 10*3/uL (ref 0.7–3.1)
Lymphs: 17 %
MCH: 29.1 pg (ref 26.6–33.0)
MCHC: 32.4 g/dL (ref 31.5–35.7)
MCV: 90 fL (ref 79–97)
Monocytes Absolute: 0.5 10*3/uL (ref 0.1–0.9)
Monocytes: 6 %
Neutrophils Absolute: 5.8 10*3/uL (ref 1.4–7.0)
Neutrophils: 71 %
Platelets: 181 10*3/uL (ref 150–450)
RBC: 4.81 x10E6/uL (ref 3.77–5.28)
RDW: 15.6 % — ABNORMAL HIGH (ref 11.7–15.4)
WBC: 8.1 10*3/uL (ref 3.4–10.8)

## 2019-12-20 LAB — CMP14+EGFR
ALT: 13 IU/L (ref 0–32)
AST: 17 IU/L (ref 0–40)
Albumin/Globulin Ratio: 1.9 (ref 1.2–2.2)
Albumin: 4.1 g/dL (ref 3.7–4.7)
Alkaline Phosphatase: 113 IU/L (ref 48–121)
BUN/Creatinine Ratio: 23 (ref 12–28)
BUN: 33 mg/dL — ABNORMAL HIGH (ref 8–27)
Bilirubin Total: 0.3 mg/dL (ref 0.0–1.2)
CO2: 18 mmol/L — ABNORMAL LOW (ref 20–29)
Calcium: 9.1 mg/dL (ref 8.7–10.3)
Chloride: 109 mmol/L — ABNORMAL HIGH (ref 96–106)
Creatinine, Ser: 1.46 mg/dL — ABNORMAL HIGH (ref 0.57–1.00)
GFR calc Af Amer: 40 mL/min/{1.73_m2} — ABNORMAL LOW (ref >59)
GFR calc non Af Amer: 34 mL/min/{1.73_m2} — ABNORMAL LOW (ref >59)
Globulin, Total: 2.2 g/dL (ref 1.5–4.5)
Glucose: 98 mg/dL (ref 65–99)
Potassium: 4.5 mmol/L (ref 3.5–5.2)
Sodium: 142 mmol/L (ref 134–144)
Total Protein: 6.3 g/dL (ref 6.0–8.5)

## 2019-12-20 LAB — COAGUCHEK XS/INR WAIVED
INR: 2.4 — ABNORMAL HIGH (ref 0.9–1.1)
Prothrombin Time: 28.6 s

## 2019-12-20 MED ORDER — BREO ELLIPTA 100-25 MCG/INH IN AEPB: 1.0000 | INHALATION_SPRAY | Freq: Every day | RESPIRATORY_TRACT | 11 refills | Status: DC

## 2019-12-20 MED ORDER — NYSTATIN 100000 UNIT/GM EX CREA: 1.0000 "application " | TOPICAL_CREAM | Freq: Two times a day (BID) | CUTANEOUS | 6 refills | Status: DC

## 2019-12-20 MED ORDER — PREDNISONE 10 MG (21) PO TBPK: ORAL_TABLET | ORAL | 0 refills | Status: DC

## 2019-12-20 MED ORDER — ALBUTEROL SULFATE (2.5 MG/3ML) 0.083% IN NEBU: 2.5000 mg | INHALATION_SOLUTION | Freq: Four times a day (QID) | RESPIRATORY_TRACT | 2 refills | Status: DC | PRN

## 2019-12-20 MED ORDER — INCRUSE ELLIPTA 62.5 MCG/INH IN AEPB: 1.0000 | INHALATION_SPRAY | Freq: Every day | RESPIRATORY_TRACT | 6 refills | Status: DC

## 2019-12-20 NOTE — Progress Notes (Signed)
Subjective:    Patient ID: Melinda Collins, female    DOB: [birth year], 79 y.o.   MRN: [id number]  Chief Complaint  Patient presents with  . Medical Management of Chronic Issues  . COPD  . Atrial Fibrillation  . Hypertension  . Depression   Pt presents to the office today to establish care with me. She is followed by [facility] every 2 months for chronic back pain. Pt has generalized weakness and needs wheelchair to help get her to her appointments.  COPD She complains of cough, shortness of breath and wheezing. This is a chronic problem. The current episode started more than 1 year ago. The problem occurs intermittently. The problem has been waxing and waning. The cough is non-productive. Associated symptoms include heartburn and malaise/fatigue. Her symptoms are alleviated by steroid inhaler. Her past medical history is significant for COPD.  Atrial Fibrillation Presents for follow-up visit. Symptoms include dizziness, hypertension and shortness of breath. The symptoms have been stable. Past medical history includes atrial fibrillation.  Hypertension This is a chronic problem. The current episode started more than 1 year ago. The problem has been resolved since onset. The problem is controlled. Associated symptoms include malaise/fatigue and shortness of breath. Pertinent negatives include no peripheral edema. Risk factors for coronary artery disease include dyslipidemia, obesity and sedentary lifestyle. The current treatment provides moderate improvement. There is no history of CAD/MI or CVA.  Depression        This is a chronic problem.  The current episode started more than 1 year ago.   The onset quality is gradual.   The problem occurs intermittently.  Associated symptoms include fatigue, helplessness, hopelessness, irritable, restlessness and sad.  Past treatments include SNRIs - Serotonin and norepinephrine reuptake inhibitors.  Compliance with treatment is good. Gastroesophageal  Reflux She complains of belching, coughing, heartburn and wheezing. This is a chronic problem. The current episode started more than 1 year ago. The problem occurs occasionally. The problem has been waxing and waning. Associated symptoms include fatigue. She has tried a PPI for the symptoms.  COPD She reports she has been out of her Memory [facility] for a few months. She using Albuterol BID. States her breathing and SOB is worse today.  A Fib PT taking warfarin daily. See anticoagulation flowsheet.    Review of Systems  Constitutional: Positive for fatigue and malaise/fatigue.  Respiratory: Positive for cough, shortness of breath and wheezing.   Gastrointestinal: Positive for heartburn.  Neurological: Positive for dizziness.  Psychiatric/Behavioral: Positive for depression.  All other systems reviewed and are negative.      Objective:   Physical Exam Vitals reviewed.  Constitutional:      General: She is irritable. She is not in acute distress.    Appearance: She is well-developed.  HENT:     Head: Normocephalic and atraumatic.     Right Ear: Tympanic membrane normal.     Left Ear: Tympanic membrane normal.  Eyes:     Pupils: Pupils are equal, round, and reactive to light.  Neck:     Thyroid: No thyromegaly.  Cardiovascular:     Rate and Rhythm: Normal rate and regular rhythm.     Heart sounds: Normal heart sounds. No murmur.  Pulmonary:     Effort: Pulmonary effort is normal. No respiratory distress.     Breath sounds: Normal breath sounds. No wheezing.  Abdominal:     General: Bowel sounds are normal. There is no distension.     Palpations: Abdomen is  soft.     Tenderness: There is no abdominal tenderness.  Musculoskeletal:        General: No tenderness.     Cervical back: Normal range of motion and neck supple.     Comments: Pain in lower lumbar with flexion or extension  Skin:    General: Skin is warm and dry.  Neurological:     Mental Status: She is alert and oriented to  person, place, and time.     Cranial Nerves: No cranial nerve deficit.     Deep Tendon Reflexes: Reflexes are normal and symmetric.  Psychiatric:        Behavior: Behavior normal.        Thought Content: Thought content normal.        Judgment: Judgment normal.       BP 133/77   Pulse (!) 103   Temp 98.5 F (36.9 C)   Ht _0  (1.626 m)   Wt 154 lb 6.4 oz (70 kg)   SpO2 96%   BMI 26.50 kg/m      Assessment & Plan:  [name] comes in today with chief complaint of Medical Management of Chronic Issues, COPD, Atrial Fibrillation, Hypertension, and Depression   Diagnosis and orders addressed:  1. Pulmonary emphysema, unspecified emphysema type ([place]) - BREO ELLIPTA 100-25 MCG/INH AEPB; Inhale 1 puff into the lungs daily.  Dispense: 60 each; Refill: 11 - INCRUSE ELLIPTA 62.5 MCG/INH AEPB; Inhale 1 puff into the lungs daily.  Dispense: 30 each; Refill: 6 - CMP14+EGFR - CBC with Differential/Platelet - DME Wheelchair manual  2. History of dermatitis - nystatin cream (MYCOSTATIN); Apply 1 application topically 2 (two) times daily.  Dispense: 30 g; Refill: 6 - CMP14+EGFR - CBC with Differential/Platelet  3. Aortic atherosclerosis (HCC) - CMP14+EGFR - CBC with Differential/Platelet  4. Paroxysmal atrial fibrillation (HCC) Description   INR 3.1 ( goal 2.0-3.0)  Hold today's dose and then decrease to 97m daily. Recheck in 1-2 weeks     - CMP14+EGFR - CBC with Differential/Platelet - CoaguChek XS/INR Waived  5. Essential hypertension - CMP14+EGFR - CBC with Differential/Platelet  6. Chronic midline low back pain without sciatica - CMP14+EGFR - CBC with Differential/Platelet - DME Wheelchair manual  7. Depression, recurrent ([place] - CMP14+EGFR - CBC with Differential/Platelet  8. High risk medication use - CMP14+EGFR - CBC with Differential/Platelet  9. Mixed hyperlipidemia - CMP14+EGFR - CBC with Differential/Platelet  10. TOBACCO ABUSE -  CMP14+EGFR - CBC with Differential/Platelet  11. Vitamin D deficiency - CMP14+EGFR - CBC with Differential/Platelet  12. COPD exacerbation (HCC) - CMP14+EGFR - CBC with Differential/Platelet - albuterol (PROVENTIL) (2.5 MG/3ML) 0.083% nebulizer solution; Take 3 mLs (2.5 mg total) by nebulization every 6 (six) hours as needed for wheezing or shortness of breath.  Dispense: 75 mL; Refill: 2 - predniSONE (STERAPRED UNI-PAK 21 TAB) 10 MG (21) TBPK tablet; Use as directed  Dispense: 21 tablet; Refill: 0  13. Weakness - CMP14+EGFR - CBC with Differential/Platelet - DME Wheelchair manual   Labs pending Health Maintenance reviewed Diet and exercise encouraged  Follow up plan: 3 months    CEvelina Dun FNP

## 2019-12-20 NOTE — Patient Instructions (Signed)

## 2019-12-26 ENCOUNTER — Other Ambulatory Visit: Payer: Self-pay | Admitting: Family Medicine

## 2019-12-26 MED ORDER — LORATADINE 10 MG PO TABS: ORAL_TABLET | ORAL | 5 refills | Status: DC

## 2019-12-26 MED ORDER — ATORVASTATIN CALCIUM 80 MG PO TABS: ORAL_TABLET | ORAL | 5 refills | Status: DC

## 2019-12-26 MED ORDER — AMLODIPINE BESYLATE 5 MG PO TABS: ORAL_TABLET | ORAL | 1 refills | Status: DC

## 2020-01-01 ENCOUNTER — Other Ambulatory Visit: Payer: Self-pay | Admitting: Family Medicine

## 2020-01-08 ENCOUNTER — Other Ambulatory Visit: Payer: Self-pay | Admitting: *Deleted

## 2020-01-08 MED ORDER — TORSEMIDE 20 MG PO TABS: ORAL_TABLET | ORAL | 2 refills | Status: DC

## 2020-01-08 MED ORDER — DILTIAZEM HCL 30 MG PO TABS: ORAL_TABLET | ORAL | 2 refills | Status: DC

## 2020-01-10 LAB — POCT INR: INR: 2.8 (ref 2.0–3.0)

## 2020-01-14 ENCOUNTER — Other Ambulatory Visit: Payer: Self-pay | Admitting: Family

## 2020-01-14 ENCOUNTER — Telehealth: Payer: Self-pay | Admitting: *Deleted

## 2020-01-14 DIAGNOSIS — I480 Paroxysmal atrial fibrillation: Secondary | ICD-10-CM

## 2020-01-14 NOTE — Progress Notes (Signed)
INR 2.8( goal 2.0-3.0)  Continue to  5mg  daily. Recheck in 1-2 weeks

## 2020-01-14 NOTE — Progress Notes (Signed)
Directions left on voice mail.   Call back to confirm.

## 2020-01-14 NOTE — Telephone Encounter (Signed)
Fax received 01/14/20 at 11:32AM mdINR PT/INR self testing service Test date/time 01/10/20 11:31AM INR 2.8

## 2020-01-22 ENCOUNTER — Other Ambulatory Visit: Payer: Self-pay | Admitting: Family

## 2020-01-22 ENCOUNTER — Telehealth: Payer: Self-pay | Admitting: *Deleted

## 2020-01-22 NOTE — Telephone Encounter (Signed)
Fax received mdINR PT/INR self testing service Test date/time 01/18/20 1202 pm INR 3.1

## 2020-01-23 NOTE — Telephone Encounter (Signed)
INR 3.1 ( goal 2.0-3.0) Too thin!  Hold today's dose (01/23/20) then resume 5mg  daily. Recheck in 1-2 weeks

## 2020-01-23 NOTE — Telephone Encounter (Signed)
Aware of instructions. 

## 2020-01-30 ENCOUNTER — Telehealth: Payer: Self-pay | Admitting: *Deleted

## 2020-01-30 NOTE — Telephone Encounter (Signed)
INR 2.8 ( goal 2.0-3.0) At goal.   Resume 5mg  daily. Recheck in 1-2 weeks

## 2020-01-30 NOTE — Telephone Encounter (Signed)
Fax received mdINR PT/INR self testing service Test date/time 01/26/20 955 am INR 2.8

## 2020-01-31 NOTE — Telephone Encounter (Signed)
Pt aware.

## 2020-02-05 ENCOUNTER — Other Ambulatory Visit: Payer: Self-pay | Admitting: *Deleted

## 2020-02-05 MED ORDER — POTASSIUM CHLORIDE CRYS ER 10 MEQ PO TBCR: 10.0000 meq | EXTENDED_RELEASE_TABLET | Freq: Every morning | ORAL | 4 refills | Status: DC

## 2020-02-07 ENCOUNTER — Telehealth: Payer: Self-pay | Admitting: *Deleted

## 2020-02-07 NOTE — Telephone Encounter (Signed)
Fax received mdINR PT/INR self testing service Test date/time 02/02/20 159 pm INR 2.9

## 2020-02-07 NOTE — Telephone Encounter (Signed)
INR 2.9 ( goal 2.0-3.0) At goal.   Resume 5mg  daily. Recheck in 1-2 weeks

## 2020-02-07 NOTE — Telephone Encounter (Signed)
Pt aware and voiced understanding 

## 2020-02-12 ENCOUNTER — Telehealth: Payer: Self-pay | Admitting: *Deleted

## 2020-02-12 NOTE — Telephone Encounter (Signed)
Fax received mdINR PT/INR self testing service Test date/time 02/09/20 151pm INR 2.3

## 2020-02-13 NOTE — Telephone Encounter (Signed)
INR  2.3 ( goal 2.0-3.0) At goal.   Resume 5mg  daily. Recheck in 1-2 weeks

## 2020-02-13 NOTE — Telephone Encounter (Signed)
Aware to resume coumadin 5 mgs

## 2020-02-18 ENCOUNTER — Ambulatory Visit (INDEPENDENT_AMBULATORY_CARE_PROVIDER_SITE_OTHER): Payer: Medicare Other | Admitting: Family

## 2020-02-18 ENCOUNTER — Encounter: Payer: Self-pay | Admitting: Family

## 2020-02-18 ENCOUNTER — Other Ambulatory Visit: Payer: Self-pay

## 2020-02-18 VITALS — BP 158/93 | HR 108 | Temp 97.6°F | Wt 160.2 lb

## 2020-02-18 DIAGNOSIS — M4716 Other spondylosis with myelopathy, lumbar region: Secondary | ICD-10-CM | POA: Diagnosis not present

## 2020-02-18 DIAGNOSIS — G8929 Other chronic pain: Secondary | ICD-10-CM

## 2020-02-18 DIAGNOSIS — Z9181 History of falling: Secondary | ICD-10-CM | POA: Diagnosis not present

## 2020-02-18 DIAGNOSIS — M5442 Lumbago with sciatica, left side: Secondary | ICD-10-CM | POA: Diagnosis not present

## 2020-02-18 DIAGNOSIS — R531 Weakness: Secondary | ICD-10-CM

## 2020-02-18 NOTE — Patient Instructions (Signed)

## 2020-02-18 NOTE — Progress Notes (Signed)
Subjective:    Patient ID: Melinda Collins, female    DOB: 1941/04/21, 79 y.o.   MRN: [id number]  Chief Complaint  Patient presents with  . Back Pain    chronic wants referral to ortho and eletric wc, will go back with who they want to go to ortho with, did not take BP meds    Pt presents to the office today with chronic back pain. She wants a referral to Ortho and wants prescription for an electric wheel chair. She has a hard time standing or walking over 5 feet. This makes it hard to cook meals, get to the bathroom, taking shower, or cleaning.   She also has paperwork she needs completed to try to get personal care services.  Back Pain This is a chronic problem. The current episode started more than 1 year ago. The problem occurs constantly. The problem has been gradually worsening since onset. The pain is present in the lumbar spine and thoracic spine. The quality of the pain is described as aching. The pain radiates to the left thigh and left knee. The pain is at a severity of 10/10. The pain is severe. The symptoms are aggravated by standing and twisting. Associated symptoms include leg pain, numbness, tingling and weakness. Pertinent negatives include no bladder incontinence, bowel incontinence or dysuria. Risk factors include obesity and menopause. She has tried analgesics and muscle relaxant for the symptoms. The treatment provided mild relief.      Review of Systems  Gastrointestinal: Negative for bowel incontinence.  Genitourinary: Negative for bladder incontinence and dysuria.  Musculoskeletal: Positive for back pain.  Neurological: Positive for tingling, weakness and numbness.  All other systems reviewed and are negative.      Objective:   Physical Exam Vitals reviewed.  Constitutional:      General: She is not in acute distress.    Appearance: She is well-developed.  HENT:     Head: Normocephalic and atraumatic.     Right Ear: Tympanic membrane normal.     Left  Ear: Tympanic membrane normal.  Eyes:     Pupils: Pupils are equal, round, and reactive to light.  Neck:     Thyroid: No thyromegaly.  Cardiovascular:     Rate and Rhythm: Normal rate and regular rhythm.     Heart sounds: Normal heart sounds. No murmur heard.   Pulmonary:     Effort: Pulmonary effort is normal. No respiratory distress.     Breath sounds: Normal breath sounds. No wheezing.  Abdominal:     General: Bowel sounds are normal. There is no distension.     Palpations: Abdomen is soft.     Tenderness: There is no abdominal tenderness.  Musculoskeletal:        General: Tenderness present.     Cervical back: Normal range of motion and neck supple.     Comments: Pain in lumbar with flexion and extensions.   Skin:    General: Skin is warm and dry.  Neurological:     Mental Status: She is alert and oriented to person, place, and time.     Cranial Nerves: No cranial nerve deficit.     Deep Tendon Reflexes: Reflexes are normal and symmetric.  Psychiatric:        Behavior: Behavior normal.        Thought Content: Thought content normal.        Judgment: Judgment normal.       BP (!) 158/93   Pulse Marland Kitchen)  108   Temp 97.6 F (36.4 C) (Temporal)   Wt 160 lb 3.2 oz (72.7 kg)   SpO2 97%   BMI 27.50 kg/m      Assessment & Plan:  Melinda Collins comes in today with chief complaint of Back Pain (chronic wants referral to ortho and eletric wc, will go back with who they want to go to ortho with, did not take BP meds )   Diagnosis and orders addressed:  1. Lumbar spondylosis with myelopathy - DME Wheelchair electric  2. Chronic midline low back pain with left-sided sciatica - DME Wheelchair electric  3. Weakness - DME Wheelchair electric  4. At high risk for falls   Paperwork completed for personal care services, and electric wheel chair. She can not remember the Ortho provider they wanted to see. She will call back with their name.   Melinda Dun,  FNP

## 2020-02-20 ENCOUNTER — Other Ambulatory Visit: Payer: Self-pay | Admitting: Family

## 2020-02-26 ENCOUNTER — Telehealth: Payer: Self-pay | Admitting: *Deleted

## 2020-02-26 NOTE — Telephone Encounter (Signed)
Fax received mdINR PT/INR self testing service Test date/time 02/26/20 1113 am INR 2.9

## 2020-02-26 NOTE — Telephone Encounter (Signed)
INR therapeutic. Continue current regimen 

## 2020-03-01 LAB — POCT INR: INR: 3.1 — AB (ref 2.0–3.0)

## 2020-03-04 ENCOUNTER — Other Ambulatory Visit: Payer: Self-pay | Admitting: Family

## 2020-03-05 ENCOUNTER — Telehealth: Payer: Self-pay | Admitting: *Deleted

## 2020-03-05 DIAGNOSIS — M545 Low back pain, unspecified: Secondary | ICD-10-CM

## 2020-03-05 DIAGNOSIS — G8929 Other chronic pain: Secondary | ICD-10-CM

## 2020-03-05 DIAGNOSIS — I480 Paroxysmal atrial fibrillation: Secondary | ICD-10-CM

## 2020-03-05 NOTE — Telephone Encounter (Signed)
Melinda Collins notified and verbalized understanding. Also states that she was supposed to call back with name of ortho so [name] could refer. Its Z[facility] Kief [facility]. The number is [phone]

## 2020-03-05 NOTE — Telephone Encounter (Signed)
INR  3.1( goal 2.0-3.0) Too thin   Hold today's dose (03/05/20), then Resume 5mg  daily. Recheck in 1-2 weeks

## 2020-03-05 NOTE — Telephone Encounter (Signed)
Ortho referral placed

## 2020-03-05 NOTE — Telephone Encounter (Signed)
Fax received mdINR PT/INR self testing service Test date/time 03/01/20 2:00PM INR 3.1

## 2020-03-05 NOTE — Addendum Note (Signed)
Addended by: [name] on: 03/05/2020 01:38 PM   Modules accepted: Orders

## 2020-03-08 LAB — POCT INR: INR: 2.8 (ref 2.0–3.0)

## 2020-03-12 ENCOUNTER — Telehealth: Payer: Self-pay | Admitting: *Deleted

## 2020-03-12 DIAGNOSIS — I480 Paroxysmal atrial fibrillation: Secondary | ICD-10-CM

## 2020-03-12 NOTE — Telephone Encounter (Signed)
Fax received mdINR PT/INR self testing service Test date/time 03/08/20 10:28AM INR 2.8

## 2020-03-12 NOTE — Telephone Encounter (Signed)
lmtcb

## 2020-03-12 NOTE — Telephone Encounter (Signed)
INR  2.8( goal 2.0-3.0) Perfect  Continue  5mg  daily. Recheck in 1-2 weeks

## 2020-03-30 ENCOUNTER — Other Ambulatory Visit: Payer: Self-pay | Admitting: Family

## 2020-03-31 ENCOUNTER — Telehealth: Payer: Self-pay | Admitting: *Deleted

## 2020-03-31 NOTE — Telephone Encounter (Signed)
Fax received mdINR PT/INR self testing service  Pt sent in 3 tests today Test date/time 03/15/20 1154 am INR 2.8  Test date/time 03/22/20 1156 am INR 3.2  Test date/time 03/29/20 1157 am INR 3.1

## 2020-04-02 NOTE — Telephone Encounter (Signed)
Patient aware and verbalizes understanding. 

## 2020-04-07 ENCOUNTER — Telehealth: Payer: Self-pay | Admitting: *Deleted

## 2020-04-07 ENCOUNTER — Other Ambulatory Visit: Payer: Self-pay | Admitting: Family

## 2020-04-07 NOTE — Telephone Encounter (Signed)
INR  2.6( goal 2.0-3.0) Perfect  Continue  5mg  daily. Recheck in 1-2 weeks

## 2020-04-07 NOTE — Telephone Encounter (Signed)
Fax received mdINR PT/INR self testing service Test date/time 04/05/20 121pm INR 2.6

## 2020-04-07 NOTE — Telephone Encounter (Signed)
Patient aware and verbalizes understanding. 

## 2020-04-11 ENCOUNTER — Other Ambulatory Visit: Payer: Self-pay | Admitting: Family Medicine

## 2020-04-11 DIAGNOSIS — J439 Emphysema, unspecified: Secondary | ICD-10-CM

## 2020-04-13 ENCOUNTER — Telehealth: Payer: Self-pay | Admitting: *Deleted

## 2020-04-13 NOTE — Telephone Encounter (Signed)
INR  2.9( goal 2.0-3.0) Perfect  Continue  5mg  daily. Recheck in 1-2 weeks

## 2020-04-13 NOTE — Telephone Encounter (Signed)
Left detailed message with instructions and to call back with any questions or concerns.

## 2020-04-13 NOTE — Telephone Encounter (Signed)
Fax received mdINR PT/INR self testing service Test date/time 04/12/20 1042 am INR 2.9

## 2020-04-21 ENCOUNTER — Telehealth: Payer: Self-pay | Admitting: *Deleted

## 2020-04-21 NOTE — Telephone Encounter (Signed)
INR  2.9( goal 2.0-3.0) Perfect  Continue  5mg  daily. Recheck in 1-2 weeks

## 2020-04-21 NOTE — Telephone Encounter (Signed)
Patient aware and verbalized understanding. °

## 2020-04-21 NOTE — Telephone Encounter (Signed)
Fax received mdINR PT/INR self testing service Test date/time 04/19/20 1011 am INR 2.9

## 2020-05-11 ENCOUNTER — Telehealth: Payer: Self-pay | Admitting: *Deleted

## 2020-05-11 NOTE — Telephone Encounter (Signed)
INR  2.8( goal 2.0-3.0) Perfect  Continue  5mg  daily. Recheck in 1-2 weeks

## 2020-05-11 NOTE — Telephone Encounter (Signed)
Fax received mdINR PT/INR self testing service Test date/time 05/10/20 159 pm INR 2.8

## 2020-05-12 ENCOUNTER — Telehealth: Payer: Self-pay | Admitting: Family Medicine

## 2020-05-12 NOTE — Telephone Encounter (Signed)
Left detailed message with coumadin dosage

## 2020-05-16 ENCOUNTER — Other Ambulatory Visit: Payer: Self-pay | Admitting: Family

## 2020-05-18 ENCOUNTER — Telehealth: Payer: Self-pay | Admitting: *Deleted

## 2020-05-18 NOTE — Telephone Encounter (Signed)
INR  2.4( goal 2.0-3.0) Perfect  Continue  5mg  daily. Recheck in 1-2 weeks

## 2020-05-18 NOTE — Telephone Encounter (Signed)
Pt aware of results and recommendations and voiced understanding. 

## 2020-05-18 NOTE — Telephone Encounter (Signed)
Fax received mdINR PT/INR self testing service Test date/time 05/17/20 929 am INR 2.4

## 2020-05-27 ENCOUNTER — Other Ambulatory Visit: Payer: Self-pay | Admitting: Family

## 2020-06-01 ENCOUNTER — Telehealth: Payer: Self-pay | Admitting: *Deleted

## 2020-06-01 NOTE — Telephone Encounter (Signed)
Indication: afib Goal INR: 2-3  Recommendations:  Continue current regimen. Repeat in 1-2 weeks

## 2020-06-01 NOTE — Telephone Encounter (Signed)
Fax received mdINR PT/INR self testing service Test date/time 05/31/20 1246 INR 2.3

## 2020-06-01 NOTE — Telephone Encounter (Signed)
Aware and verbalizes understanding.  

## 2020-06-10 ENCOUNTER — Other Ambulatory Visit: Payer: Self-pay | Admitting: *Deleted

## 2020-06-10 DIAGNOSIS — M4716 Other spondylosis with myelopathy, lumbar region: Secondary | ICD-10-CM

## 2020-06-10 DIAGNOSIS — M545 Low back pain, unspecified: Secondary | ICD-10-CM

## 2020-06-10 DIAGNOSIS — E559 Vitamin D deficiency, unspecified: Secondary | ICD-10-CM

## 2020-06-10 DIAGNOSIS — F339 Major depressive disorder, recurrent, unspecified: Secondary | ICD-10-CM

## 2020-06-10 MED ORDER — DULOXETINE HCL 60 MG PO CPEP: 120.0000 mg | ORAL_CAPSULE | Freq: Every day | ORAL | 0 refills | Status: DC

## 2020-06-15 ENCOUNTER — Telehealth: Payer: Self-pay | Admitting: *Deleted

## 2020-06-15 NOTE — Telephone Encounter (Signed)
Description   INR  2.8( goal 2.0-3.0) Perfect  Continue  5mg  daily. Recheck in 1-2 weeks

## 2020-06-15 NOTE — Telephone Encounter (Signed)
Fax received mdINR PT/INR self testing service Test date/time 06/12/20 1136 am INR 2.8

## 2020-06-15 NOTE — Telephone Encounter (Signed)
Patient aware and verbalizes understanding. 

## 2020-06-18 ENCOUNTER — Other Ambulatory Visit: Payer: Self-pay | Admitting: Family

## 2020-06-24 ENCOUNTER — Telehealth: Payer: Self-pay | Admitting: *Deleted

## 2020-06-24 ENCOUNTER — Other Ambulatory Visit: Payer: Self-pay | Admitting: Family

## 2020-06-24 NOTE — Telephone Encounter (Signed)
Fax received mdINR PT/INR self testing service Test date/time 06/23/20 1132 am INR 2.7

## 2020-06-24 NOTE — Telephone Encounter (Signed)
INR therapeutic. Continue current regimen 

## 2020-06-24 NOTE — Telephone Encounter (Signed)
[name] notified and verbalized understanding

## 2020-07-03 ENCOUNTER — Telehealth: Payer: Self-pay | Admitting: *Deleted

## 2020-07-03 NOTE — Telephone Encounter (Signed)
Patient aware and verbalizes understanding. 

## 2020-07-03 NOTE — Telephone Encounter (Signed)
Fax received mdINR PT/INR self testing service Test date/time 07/03/20  11:39 INR 2.3

## 2020-07-03 NOTE — Telephone Encounter (Signed)
Description   INR  2.3( goal 2.0-3.0) Perfect  Continue  5mg  daily. Recheck in 1-2 weeks

## 2020-07-07 ENCOUNTER — Telehealth: Payer: Self-pay | Admitting: *Deleted

## 2020-07-07 NOTE — Telephone Encounter (Signed)
Patient aware and verbalizes understanding. 

## 2020-07-07 NOTE — Telephone Encounter (Signed)
Fax received mdINR PT/INR self testing service Test date/time 07/05/20 1046 am INR 2.7

## 2020-07-07 NOTE — Telephone Encounter (Signed)
Description   INR  2.7( goal 2.0-3.0) Perfect  Continue  5mg  daily. Recheck in 1-2 weeks

## 2020-07-09 ENCOUNTER — Other Ambulatory Visit: Payer: Self-pay | Admitting: Family

## 2020-07-09 DIAGNOSIS — E559 Vitamin D deficiency, unspecified: Secondary | ICD-10-CM

## 2020-07-09 NOTE — Addendum Note (Signed)
Addended by: Antonietta Barcelona D on: 07/09/2020 04:53 PM   Modules accepted: Orders

## 2020-07-14 ENCOUNTER — Other Ambulatory Visit: Payer: Self-pay | Admitting: *Deleted

## 2020-07-14 DIAGNOSIS — E559 Vitamin D deficiency, unspecified: Secondary | ICD-10-CM

## 2020-07-15 ENCOUNTER — Telehealth: Payer: Self-pay

## 2020-07-15 ENCOUNTER — Other Ambulatory Visit: Payer: Self-pay

## 2020-07-15 MED ORDER — OSCAL 500/200 D-3 500-200 MG-UNIT PO TABS: 1.0000 | ORAL_TABLET | Freq: Every day | ORAL | 3 refills | Status: DC

## 2020-07-15 NOTE — Telephone Encounter (Signed)
Pharmacy called stating that even with a denial on pts vitamin D Rx, they still need Rx sent to them for OTC 1000-2000u daily to add to pt's account.

## 2020-07-15 NOTE — Telephone Encounter (Signed)
Rx sent for OTC vitamin D

## 2020-07-16 ENCOUNTER — Telehealth: Payer: Self-pay

## 2020-07-16 NOTE — Telephone Encounter (Signed)
  Prescription Request  07/16/2020  What is the name of the medication or equipment? RX for just regular Vitamin D and the one that was sent yesterday had calcium in with it and needs to be 1000-2000 units daily of just Vit D. States if patient needs to take with calcium they will need clarification  Have you contacted your pharmacy to request a refill? (if applicable) YES  Which pharmacy would you like this sent to? [facility]   Patient notified that their request is being sent to the clinical staff for review and that they should receive a response within 2 business days.

## 2020-07-16 NOTE — Telephone Encounter (Signed)
Will have to ask christy what she wants her on. She has 3 different vitamind  on chart and I do not know which one she wants her on.

## 2020-07-20 MED ORDER — VITAMIN D (ERGOCALCIFEROL) 1.25 MG (50000 UNIT) PO CAPS: 50000.0000 [IU] | ORAL_CAPSULE | ORAL | 3 refills | Status: DC

## 2020-07-20 NOTE — Telephone Encounter (Signed)
Prescription sent to pharmacy.

## 2020-07-22 ENCOUNTER — Other Ambulatory Visit: Payer: Self-pay | Admitting: Family

## 2020-07-28 ENCOUNTER — Other Ambulatory Visit: Payer: Self-pay | Admitting: Family

## 2020-07-28 ENCOUNTER — Telehealth: Payer: Self-pay | Admitting: *Deleted

## 2020-07-28 NOTE — Telephone Encounter (Signed)
Description   INR  2.2( goal 2.0-3.0) Perfect  Continue  5mg  daily. Recheck in 1-2 weeks

## 2020-07-28 NOTE — Telephone Encounter (Signed)
[place].NTBS 30 days given 07/07/20

## 2020-07-28 NOTE — Telephone Encounter (Signed)
Fax received mdINR PT/INR self testing service Test date/time 07/26/20 1033 am INR 2.2

## 2020-07-28 NOTE — Telephone Encounter (Signed)
lmtcb

## 2020-07-29 ENCOUNTER — Telehealth: Payer: Self-pay

## 2020-07-29 MED ORDER — ALBUTEROL SULFATE HFA 108 (90 BASE) MCG/ACT IN AERS: INHALATION_SPRAY | RESPIRATORY_TRACT | 0 refills | Status: DC

## 2020-07-29 NOTE — Telephone Encounter (Signed)
RX sent- detailed message left for patient.

## 2020-07-29 NOTE — Telephone Encounter (Signed)
  Prescription Request  07/29/2020  What is the name of the medication or equipment? albuterol (VENTOLIN HFA) 108 (90 Base) MCG/ACT inhaler    Have you contacted your pharmacy to request a refill? (if applicable) Yes it was denied but she has appt with Christy on 08/24/20 she has ran out   Which pharmacy would you like this sent to? [facility]   Patient notified that their request is being sent to the clinical staff for review and that they should receive a response within 2 business days.

## 2020-08-03 NOTE — Telephone Encounter (Signed)
Attempted to contact patient  Left detailed message per DPR This encounter will be closed

## 2020-08-05 ENCOUNTER — Telehealth: Payer: Self-pay | Admitting: *Deleted

## 2020-08-05 ENCOUNTER — Other Ambulatory Visit: Payer: Self-pay | Admitting: Family

## 2020-08-05 DIAGNOSIS — J441 Chronic obstructive pulmonary disease with (acute) exacerbation: Secondary | ICD-10-CM

## 2020-08-05 NOTE — Telephone Encounter (Signed)
INR therapeutic, continue current regimen. Recheck 2 weeks.

## 2020-08-05 NOTE — Telephone Encounter (Signed)
Fax received mdINR PT/INR self testing service Test date/time 08/02/20 940 am INR 2.4

## 2020-08-10 ENCOUNTER — Telehealth: Payer: Self-pay | Admitting: *Deleted

## 2020-08-10 NOTE — Telephone Encounter (Signed)
Called patient, no answer, left message to return call 

## 2020-08-10 NOTE — Telephone Encounter (Signed)
Description   INR  2.7 ( goal 2.0-3.0) Perfect  Continue  5mg  daily. Recheck in 1-2 weeks

## 2020-08-10 NOTE — Telephone Encounter (Signed)
Fax received mdINR PT/INR self testing service Test date/time 08/09/20 147 pm INR 2.7

## 2020-08-24 ENCOUNTER — Ambulatory Visit (INDEPENDENT_AMBULATORY_CARE_PROVIDER_SITE_OTHER): Payer: Medicare Other | Admitting: Family

## 2020-08-24 ENCOUNTER — Encounter: Payer: Self-pay | Admitting: Family

## 2020-08-24 ENCOUNTER — Other Ambulatory Visit: Payer: Self-pay

## 2020-08-24 VITALS — BP 112/72 | HR 98 | Temp 96.4°F | Ht 64.0 in | Wt 160.0 lb

## 2020-08-24 DIAGNOSIS — E559 Vitamin D deficiency, unspecified: Secondary | ICD-10-CM | POA: Diagnosis not present

## 2020-08-24 DIAGNOSIS — I10 Essential (primary) hypertension: Secondary | ICD-10-CM

## 2020-08-24 DIAGNOSIS — J439 Emphysema, unspecified: Secondary | ICD-10-CM | POA: Insufficient documentation

## 2020-08-24 DIAGNOSIS — Z1159 Encounter for screening for other viral diseases: Secondary | ICD-10-CM

## 2020-08-24 DIAGNOSIS — E782 Mixed hyperlipidemia: Secondary | ICD-10-CM

## 2020-08-24 DIAGNOSIS — F339 Major depressive disorder, recurrent, unspecified: Secondary | ICD-10-CM

## 2020-08-24 DIAGNOSIS — I480 Paroxysmal atrial fibrillation: Secondary | ICD-10-CM

## 2020-08-24 DIAGNOSIS — F17200 Nicotine dependence, unspecified, uncomplicated: Secondary | ICD-10-CM

## 2020-08-24 DIAGNOSIS — I700 Atherosclerosis of aorta: Secondary | ICD-10-CM

## 2020-08-24 DIAGNOSIS — G8929 Other chronic pain: Secondary | ICD-10-CM

## 2020-08-24 DIAGNOSIS — Z79899 Other long term (current) drug therapy: Secondary | ICD-10-CM

## 2020-08-24 DIAGNOSIS — M4716 Other spondylosis with myelopathy, lumbar region: Secondary | ICD-10-CM

## 2020-08-24 DIAGNOSIS — M545 Low back pain, unspecified: Secondary | ICD-10-CM

## 2020-08-24 NOTE — Progress Notes (Signed)
Subjective:    Patient ID: Melinda Collins, female    DOB: March 24, 1941, 80 y.o.   MRN: [id number]  Chief Complaint  Patient presents with  . Medication Refill    Pt presents to the office today for chronic follow up. She is followed by Pain Clinic every 2 months for chronic back pain. She is followed by Ortho and getting back injections and is scheduled next week to have her nerve endings burned  .   She is currently on 2 L of continuous O2.  Back Pain This is a chronic problem. The current episode started more than 1 year ago. The problem occurs intermittently. The problem has been waxing and waning since onset. The quality of the pain is described as aching. The pain is at a severity of 10/10. The pain is moderate. Associated symptoms include leg pain. Risk factors include sedentary lifestyle. She has tried analgesics, bed rest, muscle relaxant and walking for the symptoms. The treatment provided mild relief.  Hypertension This is a chronic problem. The current episode started more than 1 year ago. The problem has been waxing and waning since onset. Associated symptoms include malaise/fatigue and shortness of breath. Pertinent negatives include no peripheral edema. Risk factors for coronary artery disease include dyslipidemia, obesity, sedentary lifestyle and smoking/tobacco exposure. The current treatment provides moderate improvement.  Nicotine Dependence Presents for follow-up visit. Her urge triggers include company of smokers. Number of cigarettes per day: vaping.  Depression        This is a chronic problem.  The current episode started more than 1 year ago.   The onset quality is gradual.   The problem occurs intermittently.  Associated symptoms include irritable, restlessness and sad.  Associated symptoms include no helplessness and no hopelessness. Gastroesophageal Reflux She complains of belching and heartburn. This is a chronic problem. The current episode started more than 1  year ago. The problem occurs occasionally. She has tried a PPI for the symptoms. The treatment provided moderate relief.   COPD Continues to Vape and using 2 L of O2. States her breathing is stable. Using the Incruse and Breo most days. States she forgets some days.    Review of Systems  Constitutional: Positive for malaise/fatigue.  Respiratory: Positive for shortness of breath.   Gastrointestinal: Positive for heartburn.  Musculoskeletal: Positive for back pain.  Psychiatric/Behavioral: Positive for depression.  All other systems reviewed and are negative.      Objective:   Physical Exam Vitals reviewed.  Constitutional:      General: She is irritable. She is not in acute distress.    Appearance: She is well-developed and well-nourished.  HENT:     Head: Normocephalic and atraumatic.     Right Ear: Tympanic membrane normal.     Left Ear: Tympanic membrane normal.     Mouth/Throat:     Mouth: Oropharynx is clear and moist.  Eyes:     Pupils: Pupils are equal, round, and reactive to light.  Neck:     Thyroid: No thyromegaly.  Cardiovascular:     Rate and Rhythm: Normal rate and regular rhythm.     Pulses: Intact distal pulses.     Heart sounds: Normal heart sounds. No murmur heard.   Pulmonary:     Effort: Pulmonary effort is normal. No respiratory distress.     Breath sounds: Normal breath sounds. Decreased air movement present. No wheezing.  Abdominal:     General: Bowel sounds are normal. There is no distension.  Palpations: Abdomen is soft.     Tenderness: There is no abdominal tenderness.  Musculoskeletal:        General: No tenderness or edema.     Cervical back: Normal range of motion and neck supple.     Comments: Pain in lumbar with flexion and extension  Skin:    General: Skin is warm and dry.  Neurological:     Mental Status: She is alert and oriented to person, place, and time.     Cranial Nerves: No cranial nerve deficit.     Deep Tendon Reflexes:  Reflexes are normal and symmetric.  Psychiatric:        Mood and Affect: Mood and affect normal.        Behavior: Behavior normal.        Thought Content: Thought content normal.        Judgment: Judgment normal.       BP 112/72   Pulse 98   Temp (!) 96.4 F (35.8 C)   Ht '5\' 4"'  (1.626 m)   Wt 160 lb (72.6 kg)   SpO2 95%   BMI 27.46 kg/m      Assessment & Plan:  Melinda Collins comes in today with chief complaint of Medication Refill   Diagnosis and orders addressed:  1. Primary hypertension - CMP14+EGFR - CBC with Differential/Platelet  2. Paroxysmal atrial fibrillation (HCC) - CMP14+EGFR - CBC with Differential/Platelet  3. Aortic atherosclerosis (HCC) - CMP14+EGFR - CBC with Differential/Platelet  4. Vitamin D deficiency - CMP14+EGFR - CBC with Differential/Platelet  5. TOBACCO ABUSE - CMP14+EGFR - CBC with Differential/Platelet  6. Mixed hyperlipidemia - CMP14+EGFR - CBC with Differential/Platelet  7. High risk medication use - CMP14+EGFR - CBC with Differential/Platelet  8. Depression, recurrent ([place]) - CMP14+EGFR - CBC with Differential/Platelet  9. Chronic midline low back pain without sciatica - CMP14+EGFR - CBC with Differential/Platelet  10. Pulmonary emphysema, unspecified emphysema type ([place]) - CMP14+EGFR - CBC with Differential/Platelet - For home use only DME oxygen  11. Lumbar spondylosis with myelopathy - CMP14+EGFR - CBC with Differential/Platelet  12. Need for hepatitis C screening test - CMP14+EGFR - CBC with Differential/Platelet - Hepatitis C antibody   Labs pending Health Maintenance reviewed Diet and exercise encouraged  Follow up plan: 4 months    Evelina Dun, FNP

## 2020-08-24 NOTE — Patient Instructions (Signed)

## 2020-08-25 LAB — CBC WITH DIFFERENTIAL/PLATELET
Basophils Absolute: 0.1 10*3/uL (ref 0.0–0.2)
Basos: 1 %
EOS (ABSOLUTE): 0.3 10*3/uL (ref 0.0–0.4)
Eos: 3 %
Hematocrit: 42.8 % (ref 34.0–46.6)
Hemoglobin: 13.8 g/dL (ref 11.1–15.9)
Immature Grans (Abs): 0 10*3/uL (ref 0.0–0.1)
Immature Granulocytes: 0 %
Lymphocytes Absolute: 1.5 10*3/uL (ref 0.7–3.1)
Lymphs: 19 %
MCH: 29.7 pg (ref 26.6–33.0)
MCHC: 32.2 g/dL (ref 31.5–35.7)
MCV: 92 fL (ref 79–97)
Monocytes Absolute: 0.6 10*3/uL (ref 0.1–0.9)
Monocytes: 7 %
Neutrophils Absolute: 5.6 10*3/uL (ref 1.4–7.0)
Neutrophils: 70 %
Platelets: 215 10*3/uL (ref 150–450)
RBC: 4.64 x10E6/uL (ref 3.77–5.28)
RDW: 14.1 % (ref 11.7–15.4)
WBC: 8.1 10*3/uL (ref 3.4–10.8)

## 2020-08-25 LAB — CMP14+EGFR
ALT: 13 IU/L (ref 0–32)
AST: 16 IU/L (ref 0–40)
Albumin/Globulin Ratio: 2.2 (ref 1.2–2.2)
Albumin: 4.3 g/dL (ref 3.7–4.7)
Alkaline Phosphatase: 98 IU/L (ref 44–121)
BUN/Creatinine Ratio: 16 (ref 12–28)
BUN: 27 mg/dL (ref 8–27)
Bilirubin Total: 0.4 mg/dL (ref 0.0–1.2)
CO2: 22 mmol/L (ref 20–29)
Calcium: 8.8 mg/dL (ref 8.7–10.3)
Chloride: 103 mmol/L (ref 96–106)
Creatinine, Ser: 1.69 mg/dL — ABNORMAL HIGH (ref 0.57–1.00)
GFR calc Af Amer: 33 mL/min/{1.73_m2} — ABNORMAL LOW (ref >59)
GFR calc non Af Amer: 29 mL/min/{1.73_m2} — ABNORMAL LOW (ref >59)
Globulin, Total: 2 g/dL (ref 1.5–4.5)
Glucose: 92 mg/dL (ref 65–99)
Potassium: 4.4 mmol/L (ref 3.5–5.2)
Sodium: 142 mmol/L (ref 134–144)
Total Protein: 6.3 g/dL (ref 6.0–8.5)

## 2020-08-25 LAB — HEPATITIS C ANTIBODY: Hep C Virus Ab: <0.1 s/co ratio (ref 0.0–0.9)

## 2020-09-02 ENCOUNTER — Other Ambulatory Visit: Payer: Self-pay | Admitting: Family

## 2020-09-07 ENCOUNTER — Telehealth: Payer: Self-pay | Admitting: *Deleted

## 2020-09-07 NOTE — Telephone Encounter (Signed)
Patient aware and verbalized understanding. °

## 2020-09-07 NOTE — Telephone Encounter (Signed)
Description   INR  2.0 ( goal 2.0-3.0) Perfect  Continue  5mg  daily. Recheck in 1-2 weeks

## 2020-09-07 NOTE — Telephone Encounter (Signed)
Fax received mdINR PT/INR self testing service Test date/time 09/05/20 1348 INR 2.0

## 2020-09-08 ENCOUNTER — Other Ambulatory Visit: Payer: Self-pay | Admitting: Family

## 2020-09-15 ENCOUNTER — Other Ambulatory Visit: Payer: Self-pay

## 2020-09-15 ENCOUNTER — Encounter (HOSPITAL_COMMUNITY): Payer: Self-pay | Admitting: Emergency Medicine

## 2020-09-15 ENCOUNTER — Emergency Department (HOSPITAL_COMMUNITY)
Admission: EM | Admit: 2020-09-15 | Discharge: 2020-09-15 | Discharge status: Against Medical Advice | Payer: Medicare Other | Attending: Emergency Medicine | Admitting: Emergency Medicine

## 2020-09-15 ENCOUNTER — Emergency Department (HOSPITAL_COMMUNITY): Payer: Medicare Other

## 2020-09-15 DIAGNOSIS — W01190A Fall on same level from slipping, tripping and stumbling with subsequent striking against furniture, initial encounter: Secondary | ICD-10-CM | POA: Insufficient documentation

## 2020-09-15 DIAGNOSIS — Z79899 Other long term (current) drug therapy: Secondary | ICD-10-CM | POA: Insufficient documentation

## 2020-09-15 DIAGNOSIS — J449 Chronic obstructive pulmonary disease, unspecified: Secondary | ICD-10-CM | POA: Insufficient documentation

## 2020-09-15 DIAGNOSIS — F17210 Nicotine dependence, cigarettes, uncomplicated: Secondary | ICD-10-CM | POA: Diagnosis not present

## 2020-09-15 DIAGNOSIS — Z8542 Personal history of malignant neoplasm of other parts of uterus: Secondary | ICD-10-CM | POA: Insufficient documentation

## 2020-09-15 DIAGNOSIS — R410 Disorientation, unspecified: Secondary | ICD-10-CM | POA: Insufficient documentation

## 2020-09-15 DIAGNOSIS — I10 Essential (primary) hypertension: Secondary | ICD-10-CM | POA: Diagnosis not present

## 2020-09-15 DIAGNOSIS — Z7901 Long term (current) use of anticoagulants: Secondary | ICD-10-CM | POA: Insufficient documentation

## 2020-09-15 DIAGNOSIS — W19XXXA Unspecified fall, initial encounter: Secondary | ICD-10-CM

## 2020-09-15 LAB — CBC WITH DIFFERENTIAL/PLATELET
Abs Immature Granulocytes: 0.01 10*3/uL (ref 0.00–0.07)
Basophils Absolute: 0 10*3/uL (ref 0.0–0.1)
Basophils Relative: 1 %
Eosinophils Absolute: 0.2 10*3/uL (ref 0.0–0.5)
Eosinophils Relative: 2 %
HCT: 44.7 % (ref 36.0–46.0)
Hemoglobin: 13.8 g/dL (ref 12.0–15.0)
Immature Granulocytes: 0 %
Lymphocytes Relative: 16 %
Lymphs Abs: 1.3 10*3/uL (ref 0.7–4.0)
MCH: 29.9 pg (ref 26.0–34.0)
MCHC: 30.9 g/dL (ref 30.0–36.0)
MCV: 97 fL (ref 80.0–100.0)
Monocytes Absolute: 0.6 10*3/uL (ref 0.1–1.0)
Monocytes Relative: 8 %
Neutro Abs: 5.8 10*3/uL (ref 1.7–7.7)
Neutrophils Relative %: 73 %
Platelets: 201 10*3/uL (ref 150–400)
RBC: 4.61 MIL/uL (ref 3.87–5.11)
RDW: 15 % (ref 11.5–15.5)
WBC: 7.9 10*3/uL (ref 4.0–10.5)
nRBC: 0 % (ref 0.0–0.2)

## 2020-09-15 LAB — COMPREHENSIVE METABOLIC PANEL
ALT: 17 U/L (ref 0–44)
AST: 18 U/L (ref 15–41)
Albumin: 3.6 g/dL (ref 3.5–5.0)
Alkaline Phosphatase: 78 U/L (ref 38–126)
Anion gap: 8 (ref 5–15)
BUN: 26 mg/dL — ABNORMAL HIGH (ref 8–23)
CO2: 23 mmol/L (ref 22–32)
Calcium: 8.5 mg/dL — ABNORMAL LOW (ref 8.9–10.3)
Chloride: 106 mmol/L (ref 98–111)
Creatinine, Ser: 1.56 mg/dL — ABNORMAL HIGH (ref 0.44–1.00)
GFR, Estimated: 34 mL/min — ABNORMAL LOW (ref >60)
Glucose, Bld: 108 mg/dL — ABNORMAL HIGH (ref 70–99)
Potassium: 4.3 mmol/L (ref 3.5–5.1)
Sodium: 137 mmol/L (ref 135–145)
Total Bilirubin: 0.5 mg/dL (ref 0.3–1.2)
Total Protein: 6.5 g/dL (ref 6.5–8.1)

## 2020-09-15 LAB — CBG MONITORING, ED: Glucose-Capillary: 102 mg/dL — ABNORMAL HIGH (ref 70–99)

## 2020-09-15 LAB — PROTIME-INR
INR: 2.6 — ABNORMAL HIGH (ref 0.8–1.2)
Prothrombin Time: 27.1 seconds — ABNORMAL HIGH (ref 11.4–15.2)

## 2020-09-15 NOTE — ED Provider Notes (Signed)
[facility] EMERGENCY DEPARTMENT Provider Note   CSN: [id number] Arrival date & time: 09/15/20  1804     History Chief Complaint  Patient presents with  . Fall    Melinda Collins is a 80 y.o. female.  Patient brought in by her daughter.  Patient had a fall on Saturday hitting her head on the dresser.  Patient's daughter states she was fine then but then today started having trouble swallowing and seemed to be confused.  Patient is on Coumadin.  Oxygen saturation 98%.  Temp 98.1 heart rate 101 respirations 18 blood pressure 124/83.  Patient is on Coumadin for history of atrial fibrillation.  Patient seems to move all extremities.  Little bit trouble following commands.  Seem to have some degree of confusion.        Past Medical History:  Diagnosis Date  . Arthritis   . COPD (chronic obstructive pulmonary disease) (HCC)    spirometry 04/20/10 FEV1 1.38 (58%) with ration 70  . Depression   . Diastolic dysfunction    noted on echo w/some apparent volume issues in past but not severe   . HLD (hyperlipidemia)   . HOH (hard of hearing)   . HTN (hypertension)    mild; pulmonary  . Permanent atrial fibrillation ([place])   . Ruptured disk   . Tobacco abuse   . Uterine cancer [facility])     Patient Active Problem List   Diagnosis Date Noted  . Pulmonary emphysema ([place]) 08/24/2020  . Vitamin D deficiency 06/21/2019  . Depression, recurrent ([place]) 06/21/2019  . Lumbar spondylosis with myelopathy 09/11/2017  . Pulmonary nodules 02/08/2017  . Aortic atherosclerosis ([place]) 04/22/2016  . Permanent atrial fibrillation ([place]) 10/16/2014  . Metabolic syndrome 95/28/4132  . History of uterine cancer 12/03/2013  . Chronic midline low back pain without sciatica 05/23/2013  . High risk medication use 05/23/2013  . Swelling of limb 05/03/2013  . Hypertension 05/17/2011  . Hyperlipidemia 02/10/2009  . TOBACCO ABUSE 02/10/2009  . 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 Atrial fibrillation ([place])  02/10/2009    Past Surgical History:  Procedure Laterality Date  . ABDOMINAL HYSTERECTOMY     total  . CATARACT EXTRACTION W/PHACO Left 08/25/2014   Procedure: CATARACT EXTRACTION PHACO AND INTRAOCULAR LENS PLACEMENT (IOC);  Surgeon: Tonny Branch, MD;  Location: [facility] ORS;  Service: Ophthalmology;  Laterality: Left;  CDE 6.95  . CATARACT EXTRACTION W/PHACO Right 09/11/2014   Procedure: CATARACT EXTRACTION PHACO AND INTRAOCULAR LENS PLACEMENT (IOC);  Surgeon: Tonny Branch, MD;  Location: [facility] ORS;  Service: Ophthalmology;  Laterality: Right;  CDE:8.11  . CERVICAL DISC SURGERY     x2  . KYPHOPLASTY    . SPINE SURGERY  2013   cervical fusion     OB History   No obstetric history on file.     Family History  Problem Relation Age of Onset  . Heart disease Mother   . Stroke Mother   . Diabetes Mother   . Heart attack Father   . Kidney disease Father   . Cancer Father        prostate  . Cancer Brother   . Heart attack Brother 64  . Cancer Brother        prostate  . Heart disease Brother        CHF  . COPD Brother   . Coronary artery disease Other        fhx    Social History   Tobacco Use  . Smoking status:  Current Every Day Smoker    Packs/day: 1.00    Types: Cigarettes    Start date: 07/18/1960  . Smokeless tobacco: Never Used  . Tobacco comment: Qut last week.  08/10/16 restarted smoking cigarettes  Vaping Use  . Vaping Use: Never used  Substance Use Topics  . Alcohol use: No    Alcohol/week: 0.0 standard drinks  . Drug use: No    Home Medications Prior to Admission medications   Medication Sig Start Date End Date Taking? Authorizing Provider  albuterol (PROVENTIL) (2.5 MG/3ML) 0.083% nebulizer solution INHALE CONTENTS OF 1 VIAL IN NEBULIZER EVERY 6 HOURS AS NEEDED FOR FOR WHEEZING OR SHORTNESS OF BREATH. Patient taking differently: Take 2.5 mg by nebulization every 6 (six) hours as needed. 08/06/20  Yes Hawks, Christy A, FNP  albuterol (VENTOLIN HFA) 108 (90 Base)  MCG/ACT inhaler INHALE 1-2 PUFFS INTO THE LUNGS EVERY 4 TO 6 HOURS AS NEEDED FOR SHORTNESS OF BREATH Patient taking differently: Inhale 1-2 puffs into the lungs See admin instructions. INHALE 1-2 PUFFS INTO THE LUNGS EVERY 4 TO 6 HOURS AS NEEDED FOR SHORTNESS OF BREATH 09/08/20  Yes Hawks, Christy A, FNP  amLODipine (NORVASC) 5 MG tablet TAKE ONE TABLET BY MOUTH ONCE DAILY (MORNING) Patient taking differently: Take 5 mg by mouth daily. 09/03/20  Yes Hawks, Christy A, FNP  atorvastatin (LIPITOR) 80 MG tablet TAKE ONE TABLET BY MOUTH DAILY AT 6 PM. (,EVENING Patient taking differently: Take 80 mg by mouth daily. 09/03/20  Yes Hawks, Christy A, FNP  BREO ELLIPTA 100-25 MCG/INH AEPB Inhale 1 puff into the lungs daily. 12/20/19  Yes Hawks, Christy A, FNP  diltiazem (CARDIZEM) 30 MG tablet TAKE ONE TABLET BY MOUTH EVERY MORNING AND EVENING (MORNING ,EVENING) Patient taking differently: Take 30 mg by mouth 2 (two) times daily. 09/03/20  Yes Hawks, Christy A, FNP  DULoxetine (CYMBALTA) 60 MG capsule Take 2 capsules (120 mg total) by mouth daily. 06/10/20 09/08/20 Yes Hawks, [name] A, FNP  loratadine (ALLERGY RELIEF) 10 MG tablet TAKE ONE TABLET BY MOUTH EVERY DAY (,EVENING) 05/28/20  Yes Hawks, Christy A, FNP  Magnesium Hydroxide (DULCOLAX PO) Take 25 mg by mouth daily.   Yes [provider]  Multiple Vitamins-Minerals (CENTRUM SILVER PO) Take 1 tablet by mouth daily.   Yes [provider]  NON FORMULARY Oxygen 3L/min   Yes [provider]  oxyCODONE-acetaminophen (PERCOCET) 5-325 MG per tablet Take 2 tablets by mouth every 8 (eight) hours as needed.   Yes [provider]  potassium chloride (KLOR-CON) 10 MEQ tablet TAKE ONE TABLET BY MOUTH IN THE MORNING (MORNING) Patient taking differently: Take 10 mEq by mouth daily. 06/25/20  Yes Hawks, Christy A, FNP  torsemide (DEMADEX) 20 MG tablet TAKE 1&1/2 TO 2 TABLETS BY MOUTH EVERY MORNING (MORNING) ONE ONE-HALF IN PACK EXTRA IN  BOTTLE IF NEEDED Patient taking differently: Take 20 mg by mouth daily. 09/03/20  Yes Hawks, Christy A, FNP  warfarin (COUMADIN) 5 MG tablet TAKE 1 OR 2 TABLETS BY MOUTH AS DIRECTED BY WARFARIN CLINIC. (,EVENING) Patient taking differently: Take 5 mg by mouth See admin instructions. Take 1 tablet daily on Fridays 1 and 1/2 tablet 10/14/19  Yes Rakes, Connye Burkitt, FNP  baclofen (LIORESAL) 10 MG tablet Take 10 mg by mouth 2 (two) times daily as needed. Patient not taking: No sig reported 05/17/19   [provider]  diclofenac sodium (VOLTAREN) 1 % GEL Apply 2 g topically 4 (four) times daily. Patient not taking: No  sig reported 05/20/19   Baruch Gouty, FNP  esomeprazole (NEXIUM) 40 MG capsule TAKE ONE CAPSULE BY MOUTH ONCE DAILY (MORNING PER PT) Patient not taking: No sig reported 09/03/20   [name], FNP  fluticasone (FLONASE) 50 MCG/ACT nasal spray USE ONE SPRAY IN EACH NOSTRIL ONCE DAILY. SHAKE GENTLY BEFORE USING. Patient not taking: No sig reported 04/13/20   [name], MD  guaiFENesin (MUCINEX) 600 MG 12 hr tablet Take 1 tablet (600 mg total) by mouth 2 (two) times daily as needed. Patient not taking: No sig reported 08/10/16   [name], MD  INCRUSE ELLIPTA 62.5 MCG/INH AEPB Inhale 1 puff into the lungs daily. 12/20/19   [name], FNP  nystatin cream (MYCOSTATIN) Apply 1 application topically 2 (two) times daily. 12/20/19   [name], FNP  vitamin B-12 (CYANOCOBALAMIN) 1000 MCG tablet TAKE ONE TABLET BY MOUTH DAILY WITH BREAKFAST. (MORNING) 09/03/20   [name], FNP  Vitamin D, Ergocalciferol, (DRISDOL) 1.25 MG (50000 UNIT) CAPS capsule Take 1 capsule (50,000 Units total) by mouth every 7 (seven) days. 07/20/20   [name], FNP    Allergies    Niaspan [niacin]  Review of Systems   Review of Systems  Unable to perform ROS: Mental status change    Physical Exam Updated Vital Signs BP 124/74   Pulse (!) 44   Temp 98.1 F (36.7 C)   Resp 18    Ht 1.626 m (5\' 4" )   Wt 72.6 kg   SpO2 97%   BMI 27.47 kg/m   Physical Exam Vitals and nursing note reviewed.  Constitutional:      General: She is not in acute distress.    Appearance: Normal appearance. She is well-developed and well-nourished.  HENT:     Head: Normocephalic and atraumatic.     Mouth/Throat:     Comments: Tongue is not swollen.  Oropharynx is clear.  No obvious difficulty with swallowing Eyes:     Extraocular Movements: Extraocular movements intact.     Conjunctiva/sclera: Conjunctivae normal.     Pupils: Pupils are equal, round, and reactive to light.  Cardiovascular:     Rate and Rhythm: Normal rate and regular rhythm.     Heart sounds: No murmur heard.   Pulmonary:     Effort: Pulmonary effort is normal. No respiratory distress.     Breath sounds: Normal breath sounds.  Abdominal:     Palpations: Abdomen is soft.     Tenderness: There is no abdominal tenderness.  Musculoskeletal:        General: No edema. Normal range of motion.     Cervical back: Normal range of motion and neck supple.  Skin:    General: Skin is warm and dry.  Neurological:     General: No focal deficit present.     Mental Status: She is alert.     Cranial Nerves: No cranial nerve deficit.     Sensory: No sensory deficit.     Motor: No weakness.     Comments: Patient does seem to have some degree of confusion.  Does follow commands.  Psychiatric:        Mood and Affect: Mood and affect normal.     ED Results / Procedures / Treatments   Labs (all labs ordered are listed, but only abnormal results are displayed) Labs Reviewed  COMPREHENSIVE METABOLIC PANEL - Abnormal; Notable for the following components:      Result Value   Glucose,  Bld 108 (*)    BUN 26 (*)    Creatinine, Ser 1.56 (*)    Calcium 8.5 (*)    GFR, Estimated 34 (*)    All other components within normal limits  PROTIME-INR - Abnormal; Notable for the following components:   Prothrombin Time 27.1 (*)     INR 2.6 (*)    All other components within normal limits  CBG MONITORING, ED - Abnormal; Notable for the following components:   Glucose-Capillary 102 (*)    All other components within normal limits  CBC WITH DIFFERENTIAL/PLATELET    EKG EKG Interpretation  Date/Time:  Tuesday September 15 2020 18:23:28 EST Ventricular Rate:  81 PR Interval:    QRS Duration: 136 QT Interval:  400 QTC Calculation: 465 R Axis:   -73 Text Interpretation: Atrial fibrillation Ventricular premature complex Right bundle branch block Inferior infarct, old Anterolateral infarct, age indeterminate No significant change since last tracing Confirmed by Fredia Sorrow [phone]) on 09/15/2020 6:33:07 PM   Radiology CT Head Wo Contrast  Result Date: 09/15/2020 CLINICAL DATA:  Head trauma fall EXAM: CT HEAD WITHOUT CONTRAST TECHNIQUE: Contiguous axial images were obtained from the base of the skull through the vertex without intravenous contrast. COMPARISON:  None. FINDINGS: Brain: No acute territorial infarction, hemorrhage, or intracranial mass. Mild atrophy. Mild hypodensity in the white matter consistent with chronic small vessel ischemic change. The ventricles are nonenlarged. Vascular: No hyperdense vessels.  Carotid vascular calcification Skull: Normal. Negative for fracture or focal lesion. Sinuses/Orbits: No acute finding. Other: None IMPRESSION: 1. No CT evidence for acute intracranial abnormality. 2. Atrophy and mild chronic small vessel ischemic changes of the white matter. Electronically Signed   By: Donavan Foil M.D.   On: 09/15/2020 19:56   DG Chest Port 1 View  Result Date: 09/15/2020 CLINICAL DATA:  Fall EXAM: PORTABLE CHEST 1 VIEW COMPARISON:  None. FINDINGS: The heart size and mediastinal contours are mildly enlarged. Aortic knob calcifications are seen. Both lungs are clear. The visualized skeletal structures are unremarkable. IMPRESSION: No active disease. Electronically Signed   By: Prudencio Pair M.D.    On: 09/15/2020 19:01    Procedures Procedures   Medications Ordered in ED Medications - No data to display  ED Course  I have reviewed the triage vital signs and the nursing notes.  Pertinent labs & imaging results that were available during my care of the patient were reviewed by me and considered in my medical decision making (see chart for details).    MDM Rules/Calculators/A&P                         Patient left AMA.  Labs significant for some elevation in BUN and creatinine.  Patient chest x-ray now back without any acute findings.  CT head without any acute findings.  EKG showed atrial fibrillation rate controlled.  Final Clinical Impression(s) / ED Diagnoses Final diagnoses:  Fall, initial encounter  Confusion    Rx / DC Orders ED Discharge Orders    None       Fredia Sorrow, MD 09/15/20 2259

## 2020-09-15 NOTE — ED Notes (Signed)
Pt stating that she can not breathe and needs oxygen. Pt o2 is 97% on RA. Pt placed on 2L for comfort. States she's on @L  chronically.

## 2020-09-15 NOTE — ED Triage Notes (Signed)
Pt fell Saturday hitting her head on a dresser. Pt's daughter states that she was fine then. Daughter states that today she started having trouble swallowing and is slightly confused. Pt A&O to name and place, but not year. Pt on coumadin

## 2020-09-15 NOTE — ED Notes (Signed)
Patient transported to CT 

## 2020-09-29 ENCOUNTER — Other Ambulatory Visit: Payer: Self-pay | Admitting: Family

## 2020-09-29 DIAGNOSIS — M4716 Other spondylosis with myelopathy, lumbar region: Secondary | ICD-10-CM

## 2020-09-29 DIAGNOSIS — G8929 Other chronic pain: Secondary | ICD-10-CM

## 2020-09-29 DIAGNOSIS — M545 Low back pain, unspecified: Secondary | ICD-10-CM

## 2020-09-29 DIAGNOSIS — F339 Major depressive disorder, recurrent, unspecified: Secondary | ICD-10-CM

## 2020-09-30 ENCOUNTER — Other Ambulatory Visit: Payer: Self-pay | Admitting: *Deleted

## 2020-09-30 MED ORDER — WARFARIN SODIUM 5 MG PO TABS: ORAL_TABLET | ORAL | 0 refills | Status: DC

## 2020-10-05 ENCOUNTER — Ambulatory Visit (INDEPENDENT_AMBULATORY_CARE_PROVIDER_SITE_OTHER): Payer: Medicare Other | Admitting: Family Medicine

## 2020-10-05 ENCOUNTER — Encounter: Payer: Self-pay | Admitting: Family Medicine

## 2020-10-05 ENCOUNTER — Other Ambulatory Visit: Payer: Self-pay

## 2020-10-05 ENCOUNTER — Ambulatory Visit (INDEPENDENT_AMBULATORY_CARE_PROVIDER_SITE_OTHER): Payer: Medicare Other

## 2020-10-05 VITALS — BP 109/73 | HR 91 | Temp 97.5°F | Ht 64.0 in | Wt 158.6 lb

## 2020-10-05 DIAGNOSIS — M25552 Pain in left hip: Secondary | ICD-10-CM | POA: Diagnosis not present

## 2020-10-05 DIAGNOSIS — W07XXXA Fall from chair, initial encounter: Secondary | ICD-10-CM | POA: Diagnosis not present

## 2020-10-05 DIAGNOSIS — I480 Paroxysmal atrial fibrillation: Secondary | ICD-10-CM | POA: Diagnosis not present

## 2020-10-05 MED ORDER — PREDNISONE 10 MG PO TABS: ORAL_TABLET | ORAL | 0 refills | Status: DC

## 2020-10-05 NOTE — Progress Notes (Signed)
Chief Complaint  Patient presents with  . Fall    HPI  Patient presents today for left hip pain. She fell off a chair 2 weeks ago. She was standing on it to adjust a ceiling fan. She went to [name] Mila Homer had a  CT of her head on March 1 since she takes coumadin. That turned out normal. However, since that time she has been having increasing pain in the left hip. CT report shows there was no acute injury, just mild chronic small vessel ischemic changes.   PMH: Smoking status noted ROS: Per HPI  Objective: BP 109/73   Pulse 91   Temp (!) 97.5 F (36.4 C)   Ht 5\' 4"  (1.626 m)   Wt 158 lb 9.6 oz (71.9 kg)   SpO2 94%   BMI 27.22 kg/m  Gen: NAD, alert, cooperative with exam HEENT: NCAT, EOMI, PERRL CV: RRR, good S1/S2, no murmur Resp: CTABL, no wheezes, non-labored Abd: SNTND, BS present, no guarding or organomegaly Ext: No edema, warm Neuro: Alert and oriented, No gross deficits XR - Left hip: No acutechange Preliminary reading done by Randell Loop  Assessment and plan:  1. Left hip pain   2. Fall from chair, initial encounter   3. Paroxysmal atrial fibrillation (HCC)     Meds ordered this encounter  Medications  . predniSONE (DELTASONE) 10 MG tablet    Sig: Take 5 daily for 3 days followed by 4,3,2 and 1 for 3 days each.    Dispense:  45 tablet    Refill:  0    Orders Placed This Encounter  Procedures  . DG HIP UNILAT W OR W/O PELVIS 2-3 VIEWS LEFT    Standing Status:   Future    Number of Occurrences:   1    Standing Expiration Date:   10/05/2021    Order Specific Question:   Reason for Exam (SYMPTOM  OR DIAGNOSIS REQUIRED)    Answer:   left hip pain    Order Specific Question:   Preferred imaging location?    Answer:   Internal  . DG HIP UNILAT W OR W/O PELVIS 2-3 VIEWS LEFT    Standing Status:   Future    Standing Expiration Date:   11/05/2020    Order Specific Question:   Reason for Exam (SYMPTOM  OR DIAGNOSIS REQUIRED)    Answer:   pain after fall     Order Specific Question:   Preferred imaging location?    Answer:   [facility]    Follow up as needed.  [name], MD

## 2020-11-03 ENCOUNTER — Telehealth: Payer: Self-pay

## 2020-11-03 NOTE — Telephone Encounter (Signed)
Fax received mdINR PT/INR self testing service Test date/time 11/02/20 1:04 pm INR 2.5

## 2020-11-04 NOTE — Telephone Encounter (Signed)
Continue current dose and recheck in 2 weeks.

## 2020-11-04 NOTE — Telephone Encounter (Signed)
Aware and verbalizes understanding.  

## 2020-11-11 ENCOUNTER — Telehealth: Payer: Self-pay | Admitting: *Deleted

## 2020-11-11 ENCOUNTER — Other Ambulatory Visit: Payer: Self-pay | Admitting: Family

## 2020-11-11 NOTE — Telephone Encounter (Signed)
INR therapeutic continue current regimen.  Recheck in 2 weeks.

## 2020-11-11 NOTE — Telephone Encounter (Signed)
Pts daughter in law verbalized understanding.

## 2020-11-11 NOTE — Telephone Encounter (Signed)
Fax received mdINR PT/INR self testing service Test date/time 11/11/20 1049 am INR 2.9

## 2020-11-24 ENCOUNTER — Telehealth: Payer: Self-pay | Admitting: *Deleted

## 2020-11-24 NOTE — Telephone Encounter (Signed)
Fax received mdINR PT/INR self testing service Test date/time 11/22/20 10/15 am INR 2.5

## 2020-11-24 NOTE — Telephone Encounter (Signed)
Description   INR  2.5 ( goal 2.0-3.0) Perfect  Continue  5mg  daily. Recheck in 1-2 weeks

## 2020-11-24 NOTE — Telephone Encounter (Signed)
lmtcb

## 2020-11-30 ENCOUNTER — Other Ambulatory Visit: Payer: Self-pay | Admitting: Family

## 2020-12-04 ENCOUNTER — Telehealth: Payer: Self-pay | Admitting: *Deleted

## 2020-12-04 ENCOUNTER — Telehealth: Payer: Self-pay | Admitting: Family

## 2020-12-04 NOTE — Telephone Encounter (Signed)
Left message to call back  

## 2020-12-04 NOTE — Telephone Encounter (Signed)
Fax received mdINR PT/INR self testing service Test date/time 12/01/20 346 pm INR 2.3

## 2020-12-04 NOTE — Telephone Encounter (Signed)
  Prescription Request  12/04/2020  What is the name of the medication or equipment? New rx for oxygen concentrator, portable tank, and dx has to say COPD/ 2 LITERS A MIN CONTINUOUS  Have you contacted your pharmacy to request a refill? (if applicable) YES  Which pharmacy would you like this sent to? [facility]/ THEY HAVE 2 FAX NUMBERS: [phone] AND [phone]   Patient notified that their request is being sent to the clinical staff for review and that they should receive a response within 2 business days.

## 2020-12-04 NOTE — Telephone Encounter (Signed)
Description   INR  2.3 ( goal 2.0-3.0) Perfect   Continue  5mg  daily. Recheck in 1-2 weeks

## 2020-12-07 NOTE — Telephone Encounter (Signed)
Patient aware.

## 2020-12-07 NOTE — Telephone Encounter (Signed)
Pt only has a [employer] go portable gaseous concentrator that was ordered in 2017. Need to send order from February for the Stationary & portable oxygen unit w/ OV notes. Faxed to [place] [phone]

## 2020-12-18 ENCOUNTER — Telehealth: Payer: Self-pay | Admitting: *Deleted

## 2020-12-18 ENCOUNTER — Ambulatory Visit: Payer: Self-pay | Admitting: Nurse Practitioner

## 2020-12-18 NOTE — Telephone Encounter (Signed)
No answer, mailbox full

## 2020-12-18 NOTE — Telephone Encounter (Signed)
Fax received mdINR PT/INR self testing service Test date/time 12/16/20 452 pm INR 2.7

## 2020-12-18 NOTE — Telephone Encounter (Signed)
Pt aware to continue current dose 

## 2020-12-23 ENCOUNTER — Other Ambulatory Visit: Payer: Self-pay | Admitting: Family

## 2020-12-23 DIAGNOSIS — J439 Emphysema, unspecified: Secondary | ICD-10-CM

## 2020-12-24 ENCOUNTER — Other Ambulatory Visit: Payer: Self-pay | Admitting: Family

## 2020-12-24 DIAGNOSIS — M4716 Other spondylosis with myelopathy, lumbar region: Secondary | ICD-10-CM

## 2020-12-24 DIAGNOSIS — F339 Major depressive disorder, recurrent, unspecified: Secondary | ICD-10-CM

## 2020-12-24 DIAGNOSIS — M545 Low back pain, unspecified: Secondary | ICD-10-CM

## 2020-12-28 ENCOUNTER — Ambulatory Visit (INDEPENDENT_AMBULATORY_CARE_PROVIDER_SITE_OTHER): Payer: Medicare Other | Admitting: Family

## 2020-12-28 ENCOUNTER — Other Ambulatory Visit: Payer: Self-pay

## 2020-12-28 ENCOUNTER — Encounter: Payer: Self-pay | Admitting: Family

## 2020-12-28 VITALS — BP 138/82 | HR 61 | Temp 97.5°F | Ht 64.0 in | Wt 158.6 lb

## 2020-12-28 DIAGNOSIS — R0602 Shortness of breath: Secondary | ICD-10-CM

## 2020-12-28 DIAGNOSIS — J439 Emphysema, unspecified: Secondary | ICD-10-CM

## 2020-12-28 NOTE — Patient Instructions (Signed)
COPD and Physical Activity Chronic obstructive pulmonary disease (COPD) is a long-term, or chronic, condition that affects the lungs. COPD is a general term that can be used to describe many problems that cause inflammation of the lungs and limit airflow. These conditions include chronic bronchitis and emphysema. The main symptom of COPD is shortness of breath, which makes it harder to do even simple tasks. This can also make it harder to exercise and stay active. Talk with your health care provider about treatments to help you breathe better and actions you can take to prevent breathing problems during physical activity. What are the benefits of exercising when you have COPD? Exercising regularly is an important part of a healthy lifestyle. You can still exercise and do physical activities even though you have COPD. Exercise and physical activity improve your shortness of breath by increasing blood flow (circulation). This causes your heart to pump more oxygen through your body. Moderate exercise can: Improve oxygen use. Increase your energy level. Help with shortness of breath. Strengthen your breathing muscles. Improve heart health. Help with sleep. Improve your self-esteem and feelings of self-worth. Lower depression, stress, and anxiety. Exercise can benefit everyone with COPD. The severity of your disease may affect how hard you can exercise, especially at first, but everyone can benefit. Talk with your health care provider about how much exercise is safe for you, and which activities and exercises are safe for you. What actions can I take to prevent breathing problems during physical activity? Sign up for a pulmonary rehabilitation program. This type of program may include: Education about lung diseases. Exercise classes that teach you how to exercise and be more active while improving your breathing. This usually involves: Exercise using your lower extremities, such as a stationary  bicycle. About 30 minutes of exercise, 2 to 5 times per week, for 6 to 12 weeks. Strength training, such as push-ups or leg lifts. Nutrition education. Group classes in which you can talk with others who also have COPD and learn ways to manage stress. If you use an oxygen tank, you should use it while you exercise. Work with your health care provider to adjust your oxygen for your physical activity. Your resting flow rate is different from your flow rate during physical activity. How to manage your breathing while exercising While you are exercising: Take slow breaths. Pace yourself, and do nottry to go too fast. Purse your lips while breathing out. Pursing your lips is similar to a kissing or whistling position. If doing exercise that uses a quick burst of effort, such as weight lifting: Breathe in before starting the exercise. Breathe out during the hardest part of the exercise, such as raising the weights. Where to find support You can find support for exercising with COPD from: Your health care provider. A pulmonary rehabilitation program. Your local health department or community health programs. Support groups, either online or in-person. Your health care provider may be able to recommend support groups. Where to find more information You can find more information about exercising with COPD from: American Lung Association: lung.org COPD Foundation: copdfoundation.org Contact a health care provider if: Your symptoms get worse. You have nausea. You have a fever. You want to start a new exercise program or a new activity. Get help right away if: You have chest pain. You cannot breathe. These symptoms may represent a serious problem that is an emergency. Do not wait to see if the symptoms will go away. Get medical help right away. Call   your local emergency services (911 in the [place] Do not drive yourself to the hospital. Summary COPD is a general term that can be used to describe  many different lung problems that cause lung inflammation and limit airflow. This includes chronic bronchitis and emphysema. Exercise and physical activity improve your shortness of breath by increasing blood flow (circulation). This causes your heart to provide more oxygen to your body. Contact your health care provider before starting any exercise program or new activity. Ask your health care provider what exercises and activities are safe for you. This information is not intended to replace advice given to you by your health care provider. Make sure you discuss any questions you have with your health care provider. Document Revised: 05/12/2020 Document Reviewed: 05/12/2020 Elsevier Patient Education  2022 [employer]  

## 2020-12-28 NOTE — Progress Notes (Signed)
   Subjective:    Patient ID: Melinda Collins, female    DOB: 1941/04/12, 80 y.o.   MRN: [id number]  Chief Complaint  Patient presents with   oxygen supplies    Brought in today by daughterin-law     HPI Pt presents to the office today to renew her oxygen orders. She currently 2L continuously at home. She has COPD and  reports she has SOB all the time even when sitting.    Her O2 today sitting without Oxygen is 90%, walking is 88-90%, and walking with O2 is 97%.   Review of Systems  Respiratory:  Positive for shortness of breath.   All other systems reviewed and are negative.     Objective:   Physical Exam Vitals reviewed.  Constitutional:      General: She is not in acute distress.    Appearance: She is well-developed.  HENT:     Head: Normocephalic and atraumatic.     Right Ear: Tympanic membrane normal.     Left Ear: Tympanic membrane normal.  Eyes:     Pupils: Pupils are equal, round, and reactive to light.  Neck:     Thyroid: No thyromegaly.  Cardiovascular:     Rate and Rhythm: Normal rate and regular rhythm.     Heart sounds: Murmur heard.  Pulmonary:     Effort: Pulmonary effort is normal. No respiratory distress.     Breath sounds: Rhonchi present. No wheezing.  Abdominal:     General: Bowel sounds are normal. There is no distension.     Palpations: Abdomen is soft.     Tenderness: There is no abdominal tenderness.  Musculoskeletal:        General: No tenderness. Normal range of motion.     Cervical back: Normal range of motion and neck supple.  Skin:    General: Skin is warm and dry.  Neurological:     Mental Status: She is alert and oriented to person, place, and time.     Cranial Nerves: No cranial nerve deficit.     Deep Tendon Reflexes: Reflexes are normal and symmetric.  Psychiatric:        Behavior: Behavior normal.        Thought Content: Thought content normal.        Judgment: Judgment normal.         BP 138/82   Pulse 61   Temp  (!) 97.5 F (36.4 C) (Temporal)   Ht 5\' 4"  (1.626 m)   Wt 158 lb 9.6 oz (71.9 kg)   BMI 27.22 kg/m   Assessment & Plan:  [name] comes in today with chief complaint of oxygen supplies (Brought in today by daughterin-law )   Diagnosis and orders addressed:  1. Pulmonary emphysema, unspecified emphysema type ([place])  - For home use only DME oxygen  2. SOB (shortness of breath) - For home use only DME oxygen  Rest Oxygen orders renewed  Encouraged exercise and activity   Evelina Dun, FNP

## 2020-12-30 ENCOUNTER — Telehealth: Payer: Self-pay | Admitting: *Deleted

## 2020-12-30 NOTE — Telephone Encounter (Signed)
INR therapeutic 

## 2020-12-30 NOTE — Telephone Encounter (Signed)
Called patient, no answer, left message to return call 

## 2020-12-30 NOTE — Telephone Encounter (Signed)
Fax received [facility]R PT/INR self testing service Test date/time 12/28/20 1059 am INR 2.3

## 2021-01-02 ENCOUNTER — Other Ambulatory Visit: Payer: Self-pay | Admitting: Family

## 2021-01-04 ENCOUNTER — Other Ambulatory Visit: Payer: Self-pay | Admitting: Family

## 2021-01-04 MED ORDER — ALBUTEROL SULFATE HFA 108 (90 BASE) MCG/ACT IN AERS: INHALATION_SPRAY | RESPIRATORY_TRACT | 3 refills | Status: DC

## 2021-01-04 NOTE — Telephone Encounter (Signed)
Pt was seen in office 12/28/20 for Pulmonary Emphysema and SOB so refills sent to pharmacy.

## 2021-01-04 NOTE — Telephone Encounter (Signed)
[facility] NTBS 30 days given 12/15/20

## 2021-01-04 NOTE — Addendum Note (Signed)
Addended by: Milas Hock on: 01/04/2021 05:21 PM   Modules accepted: Orders

## 2021-01-11 ENCOUNTER — Telehealth: Payer: Self-pay | Admitting: *Deleted

## 2021-01-11 ENCOUNTER — Other Ambulatory Visit: Payer: Self-pay | Admitting: Family

## 2021-01-11 DIAGNOSIS — J441 Chronic obstructive pulmonary disease with (acute) exacerbation: Secondary | ICD-10-CM

## 2021-01-11 NOTE — Telephone Encounter (Signed)
Fax received [facility]INR PT/INR self testing service Test date/time 01/10/21 355 pm INR 2.0

## 2021-01-11 NOTE — Telephone Encounter (Signed)
Continue coumadin as is °

## 2021-01-12 NOTE — Telephone Encounter (Signed)
No answer, mailbox full

## 2021-01-12 NOTE — Telephone Encounter (Signed)
LMOVM on Melinda Collins's phone since we got disconnected to continue coumadin as is

## 2021-01-20 ENCOUNTER — Other Ambulatory Visit: Payer: Self-pay | Admitting: Family

## 2021-01-20 DIAGNOSIS — J439 Emphysema, unspecified: Secondary | ICD-10-CM

## 2021-02-02 ENCOUNTER — Other Ambulatory Visit: Payer: Self-pay | Admitting: Family

## 2021-02-02 DIAGNOSIS — J439 Emphysema, unspecified: Secondary | ICD-10-CM

## 2021-02-02 DIAGNOSIS — M4716 Other spondylosis with myelopathy, lumbar region: Secondary | ICD-10-CM

## 2021-02-02 DIAGNOSIS — F339 Major depressive disorder, recurrent, unspecified: Secondary | ICD-10-CM

## 2021-02-02 DIAGNOSIS — G8929 Other chronic pain: Secondary | ICD-10-CM

## 2021-02-02 DIAGNOSIS — M545 Low back pain, unspecified: Secondary | ICD-10-CM

## 2021-02-18 ENCOUNTER — Other Ambulatory Visit: Payer: Self-pay | Admitting: Family

## 2021-02-19 ENCOUNTER — Other Ambulatory Visit: Payer: Self-pay | Admitting: Family

## 2021-02-19 DIAGNOSIS — M4716 Other spondylosis with myelopathy, lumbar region: Secondary | ICD-10-CM

## 2021-02-19 DIAGNOSIS — G8929 Other chronic pain: Secondary | ICD-10-CM

## 2021-02-19 DIAGNOSIS — F339 Major depressive disorder, recurrent, unspecified: Secondary | ICD-10-CM

## 2021-02-26 ENCOUNTER — Telehealth: Payer: Self-pay | Admitting: *Deleted

## 2021-02-26 NOTE — Telephone Encounter (Signed)
Patient aware and verbalized understanding. °

## 2021-02-26 NOTE — Telephone Encounter (Signed)
Fax received [facility]R PT/INR self testing service Test date/time 02/14/21 1103 am INR 2.2  Fax received [facility]INR PT/INR self testing service Test date/time 02/21/21 1104 am INR 2.0   Receive 2 INR results at a time most every time I get reports on pt, I usually send you only the most recent result. Need to instruct patient to report / transmit every test for more accurate management

## 2021-02-26 NOTE — Telephone Encounter (Signed)
Description   INR  2.0 ( goal 2.0-3.0) Perfect   Continue  5mg  daily. Recheck in 1-2 weeks

## 2021-03-01 ENCOUNTER — Other Ambulatory Visit: Payer: Self-pay | Admitting: Family

## 2021-03-01 DIAGNOSIS — J439 Emphysema, unspecified: Secondary | ICD-10-CM

## 2021-03-08 ENCOUNTER — Telehealth: Payer: Self-pay | Admitting: *Deleted

## 2021-03-08 NOTE — Telephone Encounter (Signed)
2 results received today  Fax received [facility]R PT/INR self testing service Test date/time 02/28/21 105 pm INR 2.3  Fax received [facility]INR PT/INR self testing service Test date/time 03/07/21 106 pm INR 2.8

## 2021-03-09 NOTE — Telephone Encounter (Signed)
Pt & family aware

## 2021-03-09 NOTE — Telephone Encounter (Signed)
Description   INR  2.3 ( goal 2.0-3.0) Perfect   Continue  5mg  daily. Recheck in 1-2 weeks

## 2021-03-13 ENCOUNTER — Other Ambulatory Visit: Payer: Self-pay | Admitting: Family

## 2021-03-15 NOTE — Telephone Encounter (Signed)
[facility] NTBS RF given 03/13/21

## 2021-03-19 ENCOUNTER — Other Ambulatory Visit: Payer: Self-pay | Admitting: Family

## 2021-03-19 DIAGNOSIS — F339 Major depressive disorder, recurrent, unspecified: Secondary | ICD-10-CM

## 2021-03-19 DIAGNOSIS — M4716 Other spondylosis with myelopathy, lumbar region: Secondary | ICD-10-CM

## 2021-03-19 DIAGNOSIS — M545 Low back pain, unspecified: Secondary | ICD-10-CM

## 2021-03-19 DIAGNOSIS — G8929 Other chronic pain: Secondary | ICD-10-CM

## 2021-04-02 ENCOUNTER — Ambulatory Visit: Payer: Medicare Other | Admitting: Family

## 2021-04-06 ENCOUNTER — Encounter: Payer: Self-pay | Admitting: Family

## 2021-04-08 ENCOUNTER — Ambulatory Visit (INDEPENDENT_AMBULATORY_CARE_PROVIDER_SITE_OTHER): Payer: Medicare Other

## 2021-04-08 VITALS — Ht 64.0 in | Wt 158.0 lb

## 2021-04-08 DIAGNOSIS — Z0000 Encounter for general adult medical examination without abnormal findings: Secondary | ICD-10-CM | POA: Diagnosis not present

## 2021-04-08 NOTE — Progress Notes (Signed)
Subjective:   Melinda Collins is a 80 y.o. female who presents for Medicare Annual (Subsequent) preventive examination.  Virtual Visit via Telephone Note  I connected with  Melinda Collins on 04/08/21 at  3:30 PM EDT by telephone and verified that I am speaking with the correct person using two identifiers.  Location: Patient: Home Provider: [facility] Persons participating in the virtual visit: patient/Nurse Health Advisor   I discussed the limitations, risks, security and privacy concerns of performing an evaluation and management service by telephone and the availability of in person appointments. The patient expressed understanding and agreed to proceed.  Interactive audio and video telecommunications were attempted between this nurse and patient, however failed, due to patient having technical difficulties OR patient did not have access to video capability.  We continued and completed visit with audio only.  Some vital signs may be absent or patient reported.   Melinda Collins E Naveed Humphres, LPN   Review of Systems     Cardiac Risk Factors include: advanced age (>93men, >76 women);dyslipidemia;sedentary lifestyle;hypertension;Other (see comment);smoking/ tobacco exposure, Risk factor comments: emphysema, atherosclerosis, A.Fib, pulmonary HTN     Objective:    Today's Vitals   04/08/21 1532 04/08/21 1533  Weight: 158 lb (71.7 kg)   Height: 5\' 4"  (1.626 m)   PainSc:  10-Worst pain ever   Body mass index is 27.12 kg/m.  Advanced Directives 04/08/2021 09/15/2020 08/25/2014 08/20/2014 08/01/2014  Does Patient Have a Medical Advance Directive? No No Yes No No  Copy of Healthcare Power of Attorney in Chart? - - Yes - -  Would patient like information on creating a medical advance directive? No - Patient declined - - No - patient declined information No - patient declined information;Yes - Educational materials given    Current Medications (verified) Outpatient Encounter Medications as of  04/08/2021  Medication Sig   albuterol (PROVENTIL) (2.5 MG/3ML) 0.083% nebulizer solution INHALE CONTENTS OF 1 VIAL IN NEBULIZER EVERY 6 HOURS AS NEEDED FOR FOR WHEEZING OR SHORTNESS OF BREATH.   albuterol (VENTOLIN HFA) 108 (90 Base) MCG/ACT inhaler INHALE 1-2 PUFFS INTO THE LUNGS EVERY 4 TO 6 HOURS AS NEEDED FOR SHORTNESS OF BREATH  (NEEDS TO BE SEEN BEFORE NEXT REFILL)   amLODipine (NORVASC) 5 MG tablet TAKE ONE TABLET BY MOUTH ONCE DAILY (MORNING) (NEEDS TO BE SEEN BEFORE NEXT REFILL)   atorvastatin (LIPITOR) 80 MG tablet TAKE ONE TABLET BY MOUTH DAILY AT 6 PM. (NEEDS TO BE SEEN BEFORE NEXT REFILL)   baclofen (LIORESAL) 10 MG tablet Take 10 mg by mouth 2 (two) times daily as needed.   BREO ELLIPTA 100-25 MCG/INH AEPB INHALE ONE PUFF INTO THE LUNGS ONCE DAILY   diclofenac sodium (VOLTAREN) 1 % GEL Apply 2 g topically 4 (four) times daily.   diltiazem (CARDIZEM) 30 MG tablet TAKE ONE TABLET BY MOUTH EVERY MORNING AND EVENING (MORNING ,EVENING) (NEEDS TO BE SEEN BEFORE NEXT REFILL)   DULoxetine (CYMBALTA) 60 MG capsule Take 1 capsule (60 mg total) by mouth daily. (evening)(NEEDS TO BE SEEN BEFORE NEXT REFILL)   esomeprazole (NEXIUM) 40 MG capsule TAKE ONE CAPSULE BY MOUTH ONCE DAILY (MORNING PER PT) (NEEDS TO BE SEEN BEFORE NEXT REFILL)   fluticasone (FLONASE) 50 MCG/ACT nasal spray USE ONE SPRAY IN EACH NOSTRIL ONCE DAILY. SHAKE GENTLY BEFORE USING. (Patient taking differently: USE ONE SPRAY IN EACH NOSTRIL ONCE DAILY.  SHAKE GENTLY BEFORE USING.)   guaiFENesin (MUCINEX) 600 MG 12 hr tablet Take 1 tablet (600 mg total) by mouth  2 (two) times daily as needed.   INCRUSE ELLIPTA 62.5 MCG/INH AEPB INHALE ONE PUFF INTO THE LUNGS ONCE DAILY   loratadine (CLARITIN) 10 MG tablet TAKE ONE TABLET BY MOUTH EVERY DAY (,EVENING)   Magnesium Hydroxide (DULCOLAX PO) Take 25 mg by mouth daily.   Multiple Vitamins-Minerals (CENTRUM SILVER PO) Take 1 tablet by mouth daily.   NON FORMULARY Oxygen 3L/min   nystatin  cream (MYCOSTATIN) Apply 1 application topically 2 (two) times daily.   oxyCODONE-acetaminophen (PERCOCET) 5-325 MG per tablet Take 2 tablets by mouth every 8 (eight) hours as needed.   potassium chloride (KLOR-CON) 10 MEQ tablet Take 1 tablet (10 mEq total) by mouth every morning. (NEEDS TO BE SEEN BEFORE NEXT REFILL)   predniSONE (DELTASONE) 10 MG tablet Take 5 daily for 3 days followed by 4,3,2 and 1 for 3 days each.   torsemide (DEMADEX) 20 MG tablet TAKE 1&1/2 TO 2 TABLETS BY MOUTH EVERY MORNING (MORNING) ONE ONE-HALF IN PACK EXTRA IN BOTTLE IF NEEDED (NEEDS TO BE SEEN BEFORE NEXT REFILL)   vitamin B-12 (CYANOCOBALAMIN) 1000 MCG tablet TAKE ONE TABLET BY MOUTH DAILY WITH BREAKFAST. (MORNING)   Vitamin D, Ergocalciferol, (DRISDOL) 1.25 MG (50000 UNIT) CAPS capsule Take 1 capsule (50,000 Units total) by mouth every 7 (seven) days.   warfarin (COUMADIN) 5 MG tablet TAKE 1 OR 2 TABLETS BY MOUTH AS DIRECTED BY WAR[facility]IN CLINIC. (,EVENING)   No facility-administered encounter medications on file as of 04/08/2021.    Allergies (verified) Niaspan [niacin]   History: Past Medical History:  Diagnosis Date   Arthritis    COPD (chronic obstructive pulmonary disease) ([place])    spirometry 04/20/10 FEV1 1.38 (58%) with ration 70   Depression    Diastolic dysfunction    noted on echo w/some apparent volume issues in past but not severe    HLD (hyperlipidemia)    HOH (hard of hearing)    HTN (hypertension)    mild; pulmonary   Permanent atrial fibrillation (HCC)    Ruptured disk    Tobacco abuse    Uterine cancer [facility])    Past Surgical History:  Procedure Laterality Date   ABDOMINAL HYSTERECTOMY     total   CATARACT EXTRACTION W/PHACO Left 08/25/2014   Procedure: CATARACT EXTRACTION PHACO AND INTRAOCULAR LENS PLACEMENT ([place]);  Surgeon: Tonny Branch, MD;  Location: [facility];  Service: Ophthalmology;  Laterality: Left;  CDE 6.95   CATARACT EXTRACTION W/PHACO Right 09/11/2014   Procedure: CATARACT  EXTRACTION PHACO AND INTRAOCULAR LENS PLACEMENT ([facility]);  Surgeon: Tonny Branch, MD;  Location: [facility];  Service: Ophthalmology;  Laterality: Right;  CDE:8.11   CERVICAL DISC SURGERY     x2   KYPHOPLASTY     SPINE SURGERY  2013   cervical fusion   Family History  Problem Relation Age of Onset   Heart disease Mother    Stroke Mother    Diabetes Mother    Heart attack Father    Kidney disease Father    Cancer Father        prostate   Cancer Brother    Heart attack Brother 85   Cancer Brother        prostate   Heart disease Brother        CHF   COPD Brother    Coronary artery disease Other        fhx   Social History   Socioeconomic History   Marital status: Married    Spouse name: Not on file  Number of children: 1   Years of education: Not on file   Highest education level: Not on file  Occupational History   Occupation: retired  Tobacco Use   Smoking status: Former    Packs/day: 1.00    Types: Cigarettes    Start date: 07/18/1960   Smokeless tobacco: Never   Tobacco comments:    Qut last week.  08/10/16 restarted smoking cigarettes  Vaping Use   Vaping Use: Never used  Substance and Sexual Activity   Alcohol use: No    Alcohol/week: 0.0 standard drinks   Drug use: No   Sexual activity: Never    Birth control/protection: Surgical  Other Topics Concern   Not on file  Social History Narrative   Single; disabled.    Son lives near and daughter in law is Therapist, sports - they help her daily   Social Determinants of Health   Financial Resource Strain: Low Risk    Difficulty of Paying Living Expenses: Not very hard  Food Insecurity: No Food Insecurity   Worried About Charity fundraiser in the Last Year: Never true   Arboriculturist in the Last Year: Never true  Transportation Needs: No Transportation Needs   Lack of Transportation (Medical): No   Lack of Transportation (Non-Medical): No  Physical Activity: Inactive   Days of Exercise per Week: 0 days   Minutes of  Exercise per Session: 0 min  Stress: Stress Concern Present   Feeling of Stress : Rather much  Social Connections: Socially Isolated   Frequency of Communication with Friends and Family: Three times a week   Frequency of Social Gatherings with Friends and Family: Three times a week   Attends Religious Services: Never   Active Member of Clubs or Organizations: No   Attends Archivist Meetings: Never   Marital Status: Never married    Tobacco Counseling Counseling given: Not Answered Tobacco comments: Qut last week.  08/10/16 restarted smoking cigarettes   Clinical Intake:  Pre-visit preparation completed: Yes  Pain : 0-10 Pain Score: 10-Worst pain ever Pain Type: Chronic pain Pain Location: Generalized Pain Descriptors / Indicators: Aching, Discomfort, Restless, Nagging, Sharp Pain Onset: More than a month ago Pain Frequency: Constant     BMI - recorded: 27.12 Nutritional Status: BMI 25 -29 Overweight Nutritional Risks: None Diabetes: No  How often do you need to have someone help you when you read instructions, pamphlets, or other written materials from your doctor or pharmacy?: 1 - Never  Diabetic? No  Interpreter Needed?: No  Information entered by :: Vienne Corcoran, LPN   Activities of Daily Living In your present state of health, do you have any difficulty performing the following activities: 04/08/2021  Hearing? Y  Comment wears hearing aids  Vision? N  Difficulty concentrating or making decisions? Y  Walking or climbing stairs? Y  Dressing or bathing? N  Doing errands, shopping? Y  Comment will drive only short distances  Conservation officer, nature and eating ? N  Using the Toilet? N  In the past six months, have you accidently leaked urine? N  Do you have problems with loss of bowel control? N  Managing your Medications? Y  Comment has pill packaging from [place] and daughter in law gives her PRN meds  Managing your Finances? N  Housekeeping or  managing your Housekeeping? Y  Comment can do very little  Some recent data might be hidden    Patient Care Team: [name], FNP  as PCP - General (Family Medicine) Ilean China, RN as Registered Nurse  Indicate any recent [place] you may have received from other than [facility] providers in the past year (date may be approximate).     Assessment:   This is a routine wellness examination for [name].  Hearing/Vision screen Hearing Screening - Comments:: Wears hearing aids - no ENT - ordered these online Vision Screening - Comments:: Wears eyeglasses - no eye doctor  Dietary issues and exercise activities discussed: Current Exercise Habits: The patient does not participate in regular exercise at present, Exercise limited by: respiratory conditions(s);cardiac condition(s);orthopedic condition(s);psychological condition(s)   Goals Addressed             This Visit's Progress    LIFESTYLE - DECREASE FALLS RISK         Depression Screen PHQ 2/9 Scores 04/08/2021 12/28/2020 10/05/2020 08/24/2020 02/18/2020 12/20/2019 12/20/2019  PHQ - 2 Score 2 0 3 3 0 0 1  PHQ- 9 Score 9 - 9 9 - - 4    Fall Risk Fall Risk  04/08/2021 12/28/2020 10/05/2020 10/05/2020 08/24/2020  Falls in the past year? 1 0 1 0 0  Number falls in past yr: 1 - 1 - -  Injury with Fall? 0 - 1 - -  Risk for fall due to : History of fall(s);Impaired balance/gait;Impaired mobility;Orthopedic patient;Mental status change - History of fall(s);Impaired balance/gait - -  Follow up Education provided;Falls prevention discussed - Falls evaluation completed - -    FALL RISK PREVENTION PERTAINING TO THE HOME:  Any stairs in or around the home? Yes  If so, are there any without handrails? No  Home free of loose throw rugs in walkways, pet beds, electrical cords, etc? Yes  Adequate lighting in your home to reduce risk of falls? Yes   ASSISTIVE DEVICES UTILIZED TO PREVENT FALLS:  Life alert? No  Use of a cane, walker or  w/c? Yes  Grab bars in the bathroom? Yes  Shower chair or bench in shower? Yes  Elevated toilet seat or a handicapped toilet? No   TIMED UP AND GO:  Was the test performed? No . Telephonic visit.  Cognitive Function: MMSE - Mini Mental State Exam 08/01/2014  Orientation to time 0  Orientation to Place 0  Registration 0  Attention/ Calculation 0  Recall 0  Language- name 2 objects 0  Language- repeat 0  Language- follow 3 step command 0  Language- read & follow direction 0  Write a sentence 0  Copy design 0  Total score 0     6CIT Screen 04/08/2021  What Year? 0 points  What month? 0 points  What time? 0 points  Count back from 20 0 points  Months in reverse 4 points  Repeat phrase 6 points  Total Score 10    Immunizations Immunization History  Administered Date(s) Administered   Influenza, High Dose Seasonal PF 05/18/2016, 05/01/2017, 05/07/2018   Influenza,inj,Quad PF,6+ Mos 04/09/2013, 04/14/2014, 05/06/2015, 05/10/2019   Influenza-Unspecified 06/17/2020   Moderna Sars-Covid-2 Vaccination 07/22/2019, 09/12/2019, 10/08/2019   Pneumococcal Conjugate-13 07/22/2013   Pneumococcal Polysaccharide-23 05/20/2019   Tdap 08/01/2014   Zoster, Live 08/05/2013    TDAP status: Up to date  Flu Vaccine status: Due, Education has been provided regarding the importance of this vaccine. Advised may receive this vaccine at local pharmacy or Health Dept. Aware to provide a copy of the vaccination record if obtained from local pharmacy or Health Dept. Verbalized acceptance and understanding.  Pneumococcal vaccine  status: Up to date  Covid-19 vaccine status: Completed vaccines  Qualifies for Shingles Vaccine? Yes   Zostavax completed Yes   Shingrix Completed?: No.    Education has been provided regarding the importance of this vaccine. Patient has been advised to call insurance company to determine out of pocket expense if they have not yet received this vaccine. Advised may also  receive vaccine at local pharmacy or Health Dept. Verbalized acceptance and understanding.  Screening Tests Health Maintenance  Topic Date Due   DEXA SCAN  Never done   COVID-19 Vaccine (4 - Booster for Moderna series) 12/31/2019   INFLUENZA VACCINE  02/15/2021   Zoster Vaccines- Shingrix (1 of 2) 07/02/2021 (Originally 07/04/1960)   TETANUS/TDAP  08/01/2024   Hepatitis C Screening  Completed   HPV VACCINES  Aged Out    Health Maintenance  Health Maintenance Due  Topic Date Due   DEXA SCAN  Never done   COVID-19 Vaccine (4 - Booster for Moderna series) 12/31/2019   INFLUENZA VACCINE  02/15/2021    Colorectal cancer screening: No longer required.   Mammogram status: No longer required due to age.  Bone Density  Declined  Lung Cancer Screening: (Low Dose CT Chest recommended if Age 26-80 years, 30 pack-year currently smoking OR have quit w/in 15years.) does qualify.   Lung Cancer Screening Referral:  declined  Additional Screening:  Hepatitis C Screening: does not qualify  Vision Screening: Recommended annual ophthalmology exams for early detection of glaucoma and other disorders of the eye. Is the patient up to date with their annual eye exam?  No  Who is the provider or what is the name of the office in which the patient attends annual eye exams? none If pt is not established with a provider, would they like to be referred to a provider to establish care? No .   Dental Screening: Recommended annual dental exams for proper oral hygiene  Community Resource Referral / Chronic Care Management: CRR required this visit?  No   CCM required this visit?  No      Plan:     I have personally reviewed and noted the following in the patient's chart:   Medical and social history Use of alcohol, tobacco or illicit drugs  Current medications and supplements including opioid prescriptions.  Functional ability and status Nutritional status Physical activity Advanced  directives List of other physicians Hospitalizations, surgeries, and ER visits in previous 12 months Vitals Screenings to include cognitive, depression, and falls Referrals and appointments  In addition, I have reviewed and discussed with patient certain preventive protocols, quality metrics, and best practice recommendations. A written personalized care plan for preventive services as well as general preventive health recommendations were provided to patient.     Sandrea Hammond, LPN   08/31/863   Nurse Notes: None

## 2021-04-08 NOTE — Patient Instructions (Signed)
Melinda Collins , Thank you for taking time to come for your Medicare Wellness Visit. I appreciate your ongoing commitment to your health goals. Please review the following plan we discussed and let me know if I can assist you in the future.   Screening recommendations/referrals: Colonoscopy: No longer required Mammogram: No longer required Bone Density: Declined Recommended yearly ophthalmology/optometry visit for glaucoma screening and checkup Recommended yearly dental visit for hygiene and checkup  Vaccinations: Influenza vaccine: Done 06/17/2020 - Repeat annually  Pneumococcal vaccine: Done 07/22/2013 & 05/20/2019 Tdap vaccine: Done 08/01/2014 - Repeat in 10 years  Shingles vaccine: Due. Shingrix discussed. Please contact your pharmacy or insurance for coverage information.     Covid-19: Done 09/12/2019, 10/08/2019, & 07/21/2020 - due for second booster  Advanced directives: Please bring a copy of your health care power of attorney and living will to the office to be added to your chart at your convenience.   Conditions/risks identified: Try strength and balance exercises to help build your strength and prevent falls.  Next appointment: Follow up in one year for your annual wellness visit    Preventive Care 65 Years and Older, Female Preventive care refers to lifestyle choices and visits with your health care provider that can promote health and wellness. What does preventive care include? A yearly physical exam. This is also called an annual well check. Dental exams once or twice a year. Routine eye exams. Ask your health care provider how often you should have your eyes checked. Personal lifestyle choices, including: Daily care of your teeth and gums. Regular physical activity. Eating a healthy diet. Avoiding tobacco and drug use. Limiting alcohol use. Practicing safe sex. Taking low-dose aspirin every day. Taking vitamin and mineral supplements as recommended by your health care  provider. What happens during an annual well check? The services and screenings done by your health care provider during your annual well check will depend on your age, overall health, lifestyle risk factors, and family history of disease. Counseling  Your health care provider may ask you questions about your: Alcohol use. Tobacco use. Drug use. Emotional well-being. Home and relationship well-being. Sexual activity. Eating habits. History of falls. Memory and ability to understand (cognition). Work and work Statistician. Reproductive health. Screening  You may have the following tests or measurements: Height, weight, and BMI. Blood pressure. Lipid and cholesterol levels. These may be checked every 5 years, or more frequently if you are over 88 years old. Skin check. Lung cancer screening. You may have this screening every year starting at age 28 if you have a 30-pack-year history of smoking and currently smoke or have quit within the past 15 years. Fecal occult blood test (FOBT) of the stool. You may have this test every year starting at age 49. Flexible sigmoidoscopy or colonoscopy. You may have a sigmoidoscopy every 5 years or a colonoscopy every 10 years starting at age 19. Hepatitis C blood test. Hepatitis B blood test. Sexually transmitted disease (STD) testing. Diabetes screening. This is done by checking your blood sugar (glucose) after you have not eaten for a while (fasting). You may have this done every 1-3 years. Bone density scan. This is done to screen for osteoporosis. You may have this done starting at age 68. Mammogram. This may be done every 1-2 years. Talk to your health care provider about how often you should have regular mammograms. Talk with your health care provider about your test results, treatment options, and if necessary, the need for more tests.  Vaccines  Your health care provider may recommend certain vaccines, such as: Influenza vaccine. This is  recommended every year. Tetanus, diphtheria, and acellular pertussis (Tdap, Td) vaccine. You may need a Td booster every 10 years. Zoster vaccine. You may need this after age 47. Pneumococcal 13-valent conjugate (PCV13) vaccine. One dose is recommended after age 54. Pneumococcal polysaccharide (PPSV23) vaccine. One dose is recommended after age 57. Talk to your health care provider about which screenings and vaccines you need and how often you need them. This information is not intended to replace advice given to you by your health care provider. Make sure you discuss any questions you have with your health care provider. Document Released: 07/31/2015 Document Revised: 03/23/2016 Document Reviewed: 05/05/2015 Elsevier Interactive Patient Education  2017 [employer] Prevention in the Home Falls can cause injuries. They can happen to people of all ages. There are many things you can do to make your home safe and to help prevent falls. What can I do on the outside of my home? Regularly fix the edges of walkways and driveways and fix any cracks. Remove anything that might make you trip as you walk through a door, such as a raised step or threshold. Trim any bushes or trees on the path to your home. Use bright outdoor lighting. Clear any walking paths of anything that might make someone trip, such as rocks or tools. Regularly check to see if handrails are loose or broken. Make sure that both sides of any steps have handrails. Any raised decks and porches should have guardrails on the edges. Have any leaves, snow, or ice cleared regularly. Use sand or salt on walking paths during winter. Clean up any spills in your garage right away. This includes oil or grease spills. What can I do in the bathroom? Use night lights. Install grab bars by the toilet and in the tub and shower. Do not use towel bars as grab bars. Use non-skid mats or decals in the tub or shower. If you need to sit down in  the shower, use a plastic, non-slip stool. Keep the floor dry. Clean up any water that spills on the floor as soon as it happens. Remove soap buildup in the tub or shower regularly. Attach bath mats securely with double-sided non-slip rug tape. Do not have throw rugs and other things on the floor that can make you trip. What can I do in the bedroom? Use night lights. Make sure that you have a light by your bed that is easy to reach. Do not use any sheets or blankets that are too big for your bed. They should not hang down onto the floor. Have a firm chair that has side arms. You can use this for support while you get dressed. Do not have throw rugs and other things on the floor that can make you trip. What can I do in the kitchen? Clean up any spills right away. Avoid walking on wet floors. Keep items that you use a lot in easy-to-reach places. If you need to reach something above you, use a strong step stool that has a grab bar. Keep electrical cords out of the way. Do not use floor polish or wax that makes floors slippery. If you must use wax, use non-skid floor wax. Do not have throw rugs and other things on the floor that can make you trip. What can I do with my stairs? Do not leave any items on the stairs. Make sure that  there are handrails on both sides of the stairs and use them. Fix handrails that are broken or loose. Make sure that handrails are as long as the stairways. Check any carpeting to make sure that it is firmly attached to the stairs. Fix any carpet that is loose or worn. Avoid having throw rugs at the top or bottom of the stairs. If you do have throw rugs, attach them to the floor with carpet tape. Make sure that you have a light switch at the top of the stairs and the bottom of the stairs. If you do not have them, ask someone to add them for you. What else can I do to help prevent falls? Wear shoes that: Do not have high heels. Have rubber bottoms. Are comfortable  and fit you well. Are closed at the toe. Do not wear sandals. If you use a stepladder: Make sure that it is fully opened. Do not climb a closed stepladder. Make sure that both sides of the stepladder are locked into place. Ask someone to hold it for you, if possible. Clearly mark and make sure that you can see: Any grab bars or handrails. First and last steps. Where the edge of each step is. Use tools that help you move around (mobility aids) if they are needed. These include: Canes. Walkers. Scooters. Crutches. Turn on the lights when you go into a dark area. Replace any light bulbs as soon as they burn out. Set up your furniture so you have a clear path. Avoid moving your furniture around. If any of your floors are uneven, fix them. If there are any pets around you, be aware of where they are. Review your medicines with your doctor. Some medicines can make you feel dizzy. This can increase your chance of falling. Ask your doctor what other things that you can do to help prevent falls. This information is not intended to replace advice given to you by your health care provider. Make sure you discuss any questions you have with your health care provider. Document Released: 04/30/2009 Document Revised: 12/10/2015 Document Reviewed: 08/08/2014 Elsevier Interactive Patient Education  2017 [employer].

## 2021-04-13 ENCOUNTER — Other Ambulatory Visit: Payer: Self-pay | Admitting: Family

## 2021-04-13 ENCOUNTER — Encounter: Payer: Self-pay | Admitting: Family

## 2021-04-13 ENCOUNTER — Ambulatory Visit (INDEPENDENT_AMBULATORY_CARE_PROVIDER_SITE_OTHER): Payer: Medicare Other | Admitting: Family

## 2021-04-13 ENCOUNTER — Other Ambulatory Visit: Payer: Self-pay

## 2021-04-13 ENCOUNTER — Telehealth: Payer: Self-pay | Admitting: Family

## 2021-04-13 VITALS — BP 139/85 | HR 104 | Temp 97.0°F | Ht 64.0 in

## 2021-04-13 DIAGNOSIS — J439 Emphysema, unspecified: Secondary | ICD-10-CM

## 2021-04-13 DIAGNOSIS — I10 Essential (primary) hypertension: Secondary | ICD-10-CM

## 2021-04-13 DIAGNOSIS — F17200 Nicotine dependence, unspecified, uncomplicated: Secondary | ICD-10-CM

## 2021-04-13 DIAGNOSIS — M545 Low back pain, unspecified: Secondary | ICD-10-CM

## 2021-04-13 DIAGNOSIS — E782 Mixed hyperlipidemia: Secondary | ICD-10-CM

## 2021-04-13 DIAGNOSIS — I700 Atherosclerosis of aorta: Secondary | ICD-10-CM | POA: Diagnosis not present

## 2021-04-13 DIAGNOSIS — F339 Major depressive disorder, recurrent, unspecified: Secondary | ICD-10-CM

## 2021-04-13 DIAGNOSIS — F411 Generalized anxiety disorder: Secondary | ICD-10-CM | POA: Insufficient documentation

## 2021-04-13 DIAGNOSIS — I480 Paroxysmal atrial fibrillation: Secondary | ICD-10-CM | POA: Diagnosis not present

## 2021-04-13 DIAGNOSIS — M4716 Other spondylosis with myelopathy, lumbar region: Secondary | ICD-10-CM

## 2021-04-13 DIAGNOSIS — G8929 Other chronic pain: Secondary | ICD-10-CM

## 2021-04-13 MED ORDER — ALBUTEROL SULFATE HFA 108 (90 BASE) MCG/ACT IN AERS: INHALATION_SPRAY | RESPIRATORY_TRACT | 3 refills | Status: DC

## 2021-04-13 MED ORDER — BUSPIRONE HCL 5 MG PO TABS: 5.0000 mg | ORAL_TABLET | Freq: Three times a day (TID) | ORAL | 2 refills | Status: DC | PRN

## 2021-04-13 MED ORDER — SPACER/AERO-HOLDING CHAMBERS DEVI: 1.0000 | Freq: Two times a day (BID) | 1 refills | Status: DC

## 2021-04-13 MED ORDER — TRELEGY ELLIPTA 100-62.5-25 MCG/INH IN AEPB: 1.0000 | INHALATION_SPRAY | Freq: Every day | RESPIRATORY_TRACT | 4 refills | Status: DC

## 2021-04-13 NOTE — Progress Notes (Signed)
Subjective:    Patient ID: Melinda Collins, female    DOB: [birth year], 80 y.o.   MRN: [id number]  Chief Complaint  Patient presents with   Medical Management of Chronic Issues    Referral to cardiologist    Pt presents to the office today for chronic follow up. She is followed by Pain Clinic every 2 months for chronic back pain. She is followed by Ortho and getting back injections and is scheduled next week to have her nerve endings burned  .    She is currently on 4 L of continuous O2.  She has aortic atherosclerosis and takes Lipitor daily.  Hypertension This is a chronic problem. The current episode started more than 1 year ago. The problem has been resolved since onset. The problem is controlled. Associated symptoms include malaise/fatigue and shortness of breath. Pertinent negatives include no peripheral edema. Risk factors for coronary artery disease include dyslipidemia. The current treatment provides moderate improvement.  Back Pain This is a chronic problem. The current episode started more than 1 year ago. The problem occurs intermittently. The pain is present in the lumbar spine. The pain is at a severity of 10/10. The pain is moderate.  Depression        This is a chronic problem.  The current episode started more than 1 year ago.   The onset quality is gradual.   The problem occurs intermittently.  Associated symptoms include helplessness, hopelessness, irritable, restlessness and sad. Hyperlipidemia This is a chronic problem. The current episode started more than 1 year ago. Exacerbating diseases include obesity. Associated symptoms include shortness of breath. Current antihyperlipidemic treatment includes statins. The current treatment provides moderate improvement of lipids. Risk factors for coronary artery disease include dyslipidemia, hypertension and a sedentary lifestyle.  Nicotine Dependence Presents for follow-up visit. Her urge triggers include company of smokers.  The symptoms have been stable. Number of cigarettes per day: vaping.  COPD Has SOB and using 4 L of O2. She is currently vaping.    Review of Systems  Constitutional:  Positive for malaise/fatigue.  Respiratory:  Positive for shortness of breath.   Musculoskeletal:  Positive for back pain.  Psychiatric/Behavioral:  Positive for depression.   All other systems reviewed and are negative.     Objective:   Physical Exam Vitals reviewed.  Constitutional:      General: She is irritable. She is not in acute distress.    Appearance: She is well-developed. She is obese.  HENT:     Head: Normocephalic and atraumatic.     Right Ear: Tympanic membrane normal.     Left Ear: Tympanic membrane normal.  Eyes:     Pupils: Pupils are equal, round, and reactive to light.  Neck:     Thyroid: No thyromegaly.  Cardiovascular:     Rate and Rhythm: Normal rate and regular rhythm.     Heart sounds: Normal heart sounds. No murmur heard. Pulmonary:     Effort: Pulmonary effort is normal. No respiratory distress.     Breath sounds: Wheezing and rhonchi present.     Comments: 2 L O2 Abdominal:     General: Bowel sounds are normal. There is no distension.     Palpations: Abdomen is soft.     Tenderness: There is no abdominal tenderness.  Musculoskeletal:        General: No tenderness.     Cervical back: Normal range of motion and neck supple.  Skin:    General: Skin is  warm and dry.  Neurological:     Mental Status: She is alert and oriented to person, place, and time.     Cranial Nerves: No cranial nerve deficit.     Motor: Weakness present.     Gait: Gait abnormal.     Deep Tendon Reflexes: Reflexes are normal and symmetric.  Psychiatric:        Behavior: Behavior normal.        Thought Content: Thought content normal.        Judgment: Judgment normal.      BP 139/85   Pulse (!) 104   Temp (!) 97 F (36.1 C) (Temporal)   Ht _0  (1.626 m)   SpO2 97%   BMI 27.12 kg/m       Assessment & Plan:  Melinda Collins comes in today with chief complaint of Medical Management of Chronic Issues (Referral to cardiologist )   Diagnosis and orders addressed:  1. Primary hypertension - Ambulatory referral to Cardiology - CBC with Differential/Platelet - CMP14+EGFR  2. Paroxysmal atrial fibrillation (HCC) - Ambulatory referral to Cardiology - CBC with Differential/Platelet - CMP14+EGFR  3. Aortic atherosclerosis (HCC) - Ambulatory referral to Cardiology - CBC with Differential/Platelet - CMP14+EGFR  4. Pulmonary emphysema, unspecified emphysema type ([place]) - Fluticasone-Umeclidin-Vilant (TRELEGY ELLIPTA) 100-62.5-25 MCG/INH AEPB; Inhale 1 puff into the lungs daily.  Dispense: 3 each; Refill: 4 - albuterol (VENTOLIN HFA) 108 (90 Base) MCG/ACT inhaler; INHALE 1-2 PUFFS INTO THE LUNGS EVERY 4 TO 6 HOURS AS NEEDED FOR SHORTNESS OF BREATH  (NEEDS TO BE SEEN BEFORE NEXT REFILL)  Dispense: 8.5 g; Refill: 3 - Spacer/Aero-Holding Chambers DEVI; 1 Device by Does not apply route in the morning and at bedtime.  Dispense: 1 each; Refill: 1 - CBC with Differential/Platelet - CMP14+EGFR  5. Depression, recurrent (HCC) - busPIRone (BUSPAR) 5 MG tablet; Take 1 tablet (5 mg total) by mouth 3 (three) times daily as needed.  Dispense: 90 tablet; Refill: 2 - CBC with Differential/Platelet - CMP14+EGFR  6. Chronic midline low back pain without sciatica - CBC with Differential/Platelet - CMP14+EGFR  7. TOBACCO ABUSE  - CBC with Differential/Platelet - CMP14+EGFR  8. Mixed hyperlipidemia - CBC with Differential/Platelet - CMP14+EGFR  9. GAD (generalized anxiety disorder) - busPIRone (BUSPAR) 5 MG tablet; Take 1 tablet (5 mg total) by mouth 3 (three) times daily as needed.  Dispense: 90 tablet; Refill: 2 - CBC with Differential/Platelet - CMP14+EGFR  Added Buspar 5 mg TID prn  Will send in Phil Campbell pending Health Maintenance reviewed Diet and exercise  encouraged  Follow up plan: 3 months    Evelina Dun, FNP

## 2021-04-13 NOTE — Patient Instructions (Signed)
Managing the Challenge of Quitting Smoking Quitting smoking is a physical and mental challenge. You will face cravings, withdrawal symptoms, and temptation. Before quitting, work with your health care provider to make a plan that can help you manage quitting. Preparation canhelp you quit and keep you from giving in. How to manage lifestyle changes Managing stress Stress can make you want to smoke, and wanting to smoke may cause stress. It is important to find ways to manage your stress. You might try some of the following: Practice relaxation techniques. Breathe slowly and deeply, in through your nose and out through your mouth. Listen to music. Soak in a bath or take a shower. Imagine a peaceful place or vacation. Get some support. Talk with family or friends about your stress. Join a support group. Talk with a counselor or therapist. Get some physical activity. Go for a walk, run, or bike ride. Play a favorite sport. Practice yoga.  Medicines Talk with your health care provider about medicines that might help you dealwith cravings and make quitting easier for you. Relationships Social situations can be difficult when you are quitting smoking. To manage this, you can: Avoid parties and other social situations where people might be smoking. Avoid alcohol. Leave right away if you have the urge to smoke. Explain to your family and friends that you are quitting smoking. Ask for support and let them know you might be a bit grumpy. Plan activities where smoking is not an option. General instructions Be aware that many people gain weight after they quit smoking. However, not everyone does. To keep from gaining weight, have a plan in place before you quit and stick to the plan after you quit. Your plan should include: Having healthy snacks. When you have a craving, it may help to: Eat popcorn, carrots, celery, or other cut vegetables. Chew sugar-free gum. Changing how you eat. Eat small  portion sizes at meals. Eat 4-6 small meals throughout the day instead of 1-2 large meals a day. Be mindful when you eat. Do not watch television or do other things that might distract you as you eat. Exercising regularly. Make time to exercise each day. If you do not have time for a long workout, do short bouts of exercise for 5-10 minutes several times a day. Do some form of strengthening exercise, such as weight lifting. Do some exercise that gets your heart beating and causes you to breathe deeply, such as walking fast, running, swimming, or biking. This is very important. Drinking plenty of water or other low-calorie or no-calorie drinks. Drink 6-8 glasses of water daily.  How to recognize withdrawal symptoms Your body and mind may experience discomfort as you try to get used to not having nicotine in your system. These effects are called withdrawal symptoms. They may include: Feeling hungrier than normal. Having trouble concentrating. Feeling irritable or restless. Having trouble sleeping. Feeling depressed. Craving a cigarette. To manage withdrawal symptoms: Avoid places, people, and activities that trigger your cravings. Remember why you want to quit. Get plenty of sleep. Avoid coffee and other caffeinated drinks. These may worsen some of your symptoms. These symptoms may surprise you. But be assured that they are normal to havewhen quitting smoking. How to manage cravings Come up with a plan for how to deal with your cravings. The plan should include the following: A definition of the specific situation you want to deal with. An alternative action you will take. A clear idea for how this action will help. The   name of someone who might help you with this. Cravings usually last for 5-10 minutes. Consider taking the following actions to help you with your plan to deal with cravings: Keep your mouth busy. Chew sugar-free gum. Suck on hard candies or a straw. Brush your  teeth. Keep your hands and body busy. Change to a different activity right away. Squeeze or play with a ball. Do an activity or a hobby, such as making bead jewelry, practicing needlepoint, or working with wood. Mix up your normal routine. Take a short exercise break. Go for a quick walk or run up and down stairs. Focus on doing something kind or helpful for someone else. Call a friend or family member to talk during a craving. Join a support group. Contact a quitline. Where to find support To get help or find a support group: Call the National Cancer Institute's Smoking Quitline: 1-800-QUIT NOW (784-8669) Visit the website of the Substance Abuse and Mental Health Services Administration: www.samhsa.gov Text QUIT to SmokefreeTXT: 478848 Where to find more information Visit these websites to find more information on quitting smoking: National Cancer Institute: www.smokefree.gov American Lung Association: www.lung.org American Cancer Society: www.cancer.org Centers for Disease Control and Prevention: www.cdc.gov American Heart Association: www.heart.org Contact a health care provider if: You want to change your plan for quitting. The medicines you are taking are not helping. Your eating feels out of control or you cannot sleep. Get help right away if: You feel depressed or become very anxious. Summary Quitting smoking is a physical and mental challenge. You will face cravings, withdrawal symptoms, and temptation to smoke again. Preparation can help you as you go through these challenges. Try different techniques to manage stress, handle social situations, and prevent weight gain. You can deal with cravings by keeping your mouth busy (such as by chewing gum), keeping your hands and body busy, calling family or friends, or contacting a quitline for people who want to quit smoking. You can deal with withdrawal symptoms by avoiding places where people smoke, getting plenty of rest, and  avoiding drinks with caffeine. This information is not intended to replace advice given to you by your health care provider. Make sure you discuss any questions you have with your healthcare provider. Document Revised: 04/23/2019 Document Reviewed: 04/23/2019 Elsevier Patient Education  2022 [employer]  

## 2021-04-13 NOTE — Telephone Encounter (Signed)
Pharm called back to clarify new med - notes reviewed.

## 2021-04-14 LAB — CBC WITH DIFFERENTIAL/PLATELET
Basophils Absolute: 0.1 10*3/uL (ref 0.0–0.2)
Basos: 1 %
EOS (ABSOLUTE): 0.3 10*3/uL (ref 0.0–0.4)
Eos: 4 %
Hematocrit: 33.3 % — ABNORMAL LOW (ref 34.0–46.6)
Hemoglobin: 10.5 g/dL — ABNORMAL LOW (ref 11.1–15.9)
Immature Grans (Abs): 0 10*3/uL (ref 0.0–0.1)
Immature Granulocytes: 1 %
Lymphocytes Absolute: 0.8 10*3/uL (ref 0.7–3.1)
Lymphs: 11 %
MCH: 28.5 pg (ref 26.6–33.0)
MCHC: 31.5 g/dL (ref 31.5–35.7)
MCV: 90 fL (ref 79–97)
Monocytes Absolute: 0.7 10*3/uL (ref 0.1–0.9)
Monocytes: 8 %
Neutrophils Absolute: 5.9 10*3/uL (ref 1.4–7.0)
Neutrophils: 75 %
Platelets: 191 10*3/uL (ref 150–450)
RBC: 3.69 x10E6/uL — ABNORMAL LOW (ref 3.77–5.28)
RDW: 15.1 % (ref 11.7–15.4)
WBC: 7.8 10*3/uL (ref 3.4–10.8)

## 2021-04-14 LAB — CMP14+EGFR
ALT: 14 IU/L (ref 0–32)
AST: 13 IU/L (ref 0–40)
Albumin/Globulin Ratio: 2.2 (ref 1.2–2.2)
Albumin: 4.1 g/dL (ref 3.7–4.7)
Alkaline Phosphatase: 101 IU/L (ref 44–121)
BUN/Creatinine Ratio: 21 (ref 12–28)
BUN: 31 mg/dL — ABNORMAL HIGH (ref 8–27)
Bilirubin Total: 0.5 mg/dL (ref 0.0–1.2)
CO2: 26 mmol/L (ref 20–29)
Calcium: 9 mg/dL (ref 8.7–10.3)
Chloride: 104 mmol/L (ref 96–106)
Creatinine, Ser: 1.51 mg/dL — ABNORMAL HIGH (ref 0.57–1.00)
Globulin, Total: 1.9 g/dL (ref 1.5–4.5)
Glucose: 110 mg/dL — ABNORMAL HIGH (ref 70–99)
Potassium: 4.8 mmol/L (ref 3.5–5.2)
Sodium: 146 mmol/L — ABNORMAL HIGH (ref 134–144)
Total Protein: 6 g/dL (ref 6.0–8.5)
eGFR: 35 mL/min/{1.73_m2} — ABNORMAL LOW (ref >59)

## 2021-04-23 ENCOUNTER — Ambulatory Visit (INDEPENDENT_AMBULATORY_CARE_PROVIDER_SITE_OTHER): Payer: Medicare Other | Admitting: Family

## 2021-04-23 ENCOUNTER — Encounter: Payer: Self-pay | Admitting: Family

## 2021-04-23 DIAGNOSIS — J441 Chronic obstructive pulmonary disease with (acute) exacerbation: Secondary | ICD-10-CM | POA: Diagnosis not present

## 2021-04-23 MED ORDER — AZITHROMYCIN 250 MG PO TABS: ORAL_TABLET | ORAL | 0 refills | Status: DC

## 2021-04-23 MED ORDER — PREDNISONE 10 MG (21) PO TBPK: ORAL_TABLET | ORAL | 0 refills | Status: DC

## 2021-04-23 NOTE — Progress Notes (Signed)
Virtual Visit  Note Due to COVID-19 pandemic this visit was conducted virtually. This visit type was conducted due to national recommendations for restrictions regarding the COVID-19 Pandemic (e.g. social distancing, sheltering in place) in an effort to limit this patient's exposure and mitigate transmission in our community. All issues noted in this document were discussed and addressed.  A physical exam was not performed with this format.  I connected with [place] on 04/23/21 at 2:30 pm  by telephone and verified that I am speaking with the correct person using two identifiers. Melinda Collins is currently located at home and  no one is currently with her  during visit. The provider, Evelina Dun, FNP is located in their office at time of visit.  I discussed the limitations, risks, security and privacy concerns of performing an evaluation and management service by telephone and the availability of in person appointments. I also discussed with the patient that there may be a patient responsible charge related to this service. The patient expressed understanding and agreed to proceed.   Melinda Collins, Melinda Collins are scheduled for a virtual visit with your provider today.    Just as we do with appointments in the office, we must obtain your consent to participate.  Your consent will be active for this visit and any virtual visit you may have with one of our providers in the next 365 days.    If you have a MyChart account, I can also send a copy of this consent to you electronically.  All virtual visits are billed to your insurance company just like a traditional visit in the office.  As this is a virtual visit, video technology does not allow for your provider to perform a traditional examination.  This may limit your provider's ability to fully assess your condition.  If your provider identifies any concerns that need to be evaluated in person or the need to arrange testing such as labs, EKG,  etc, we will make arrangements to do so.    Although advances in technology are sophisticated, we cannot ensure that it will always work on either your end or our end.  If the connection with a video visit is poor, we may have to switch to a telephone visit.  With either a video or telephone visit, we are not always able to ensure that we have a secure connection.   I need to obtain your verbal consent now.   Are you willing to proceed with your visit today?   Melinda Collins has provided verbal consent on 04/23/2021 for a virtual visit (video or telephone).   Evelina Dun, Roy 04/23/2021  2:37 PM    History and Present Illness:  Cough This is a recurrent problem. The current episode started 1 to 4 weeks ago. The problem has been gradually worsening. The problem occurs every few minutes. The cough is Productive of sputum. Associated symptoms include a fever (low grade), nasal congestion, postnasal drip, shortness of breath and wheezing. Pertinent negatives include no chills, ear congestion, ear pain, headaches or myalgias. Risk factors for lung disease include smoking/tobacco exposure. She has tried rest and OTC cough suppressant for the symptoms. The treatment provided mild relief.     Review of Systems  Constitutional:  Positive for fever (low grade). Negative for chills.  HENT:  Positive for postnasal drip. Negative for ear pain.   Respiratory:  Positive for cough, shortness of breath and wheezing.   Musculoskeletal:  Negative for myalgias.  Neurological:  Negative for headaches.    Observations/Objective: No SOB or distress noted, nasal congestion and hoarse voice   Assessment and Plan: 1. COPD exacerbation ([place]) Pt quit smoking 6 days ago!!! Keep up the great work/  Mucinex Force fluids Rest RTO if symptoms worsen or do not improve  - azithromycin (ZITHROMAX) 250 MG tablet; Take 500 mg once, then 250 mg for four days  Dispense: 6 tablet; Refill: 0 - predniSONE (STERAPRED  UNI-PAK 21 TAB) 10 MG (21) TBPK tablet; Use as directed  Dispense: 21 tablet; Refill: 0    I discussed the assessment and treatment plan with the patient. The patient was provided an opportunity to ask questions and all were answered. The patient agreed with the plan and demonstrated an understanding of the instructions.   The patient was advised to call back or seek an in-person evaluation if the symptoms worsen or if the condition fails to improve as anticipated.  The above assessment and management plan was discussed with the patient. The patient verbalized understanding of and has agreed to the management plan. Patient is aware to call the clinic if symptoms persist or worsen. Patient is aware when to return to the clinic for a follow-up visit. Patient educated on when it is appropriate to go to the emergency department.   Time call ended:  2:41 pm   I provided 11 minutes of  non face-to-face time during this encounter.    Evelina Dun, FNP

## 2021-04-25 ENCOUNTER — Inpatient Hospital Stay (HOSPITAL_COMMUNITY)
Admission: EM | Admit: 2021-04-25 | Discharge: 2021-05-03 | DRG: 291 | Disposition: A | Discharge status: Home Health | Payer: Medicare Other | Attending: Internal Medicine | Admitting: Internal Medicine

## 2021-04-25 ENCOUNTER — Other Ambulatory Visit: Payer: Self-pay

## 2021-04-25 ENCOUNTER — Emergency Department (HOSPITAL_COMMUNITY): Payer: Medicare Other

## 2021-04-25 ENCOUNTER — Encounter (HOSPITAL_COMMUNITY): Payer: Self-pay | Admitting: Emergency Medicine

## 2021-04-25 DIAGNOSIS — Z981 Arthrodesis status: Secondary | ICD-10-CM

## 2021-04-25 DIAGNOSIS — Z66 Do not resuscitate: Secondary | ICD-10-CM | POA: Diagnosis not present

## 2021-04-25 DIAGNOSIS — Z888 Allergy status to other drugs, medicaments and biological substances status: Secondary | ICD-10-CM

## 2021-04-25 DIAGNOSIS — E785 Hyperlipidemia, unspecified: Secondary | ICD-10-CM | POA: Diagnosis present

## 2021-04-25 DIAGNOSIS — F411 Generalized anxiety disorder: Secondary | ICD-10-CM | POA: Diagnosis present

## 2021-04-25 DIAGNOSIS — Z20822 Contact with and (suspected) exposure to covid-19: Secondary | ICD-10-CM | POA: Diagnosis present

## 2021-04-25 DIAGNOSIS — I4891 Unspecified atrial fibrillation: Secondary | ICD-10-CM | POA: Diagnosis not present

## 2021-04-25 DIAGNOSIS — Z8249 Family history of ischemic heart disease and other diseases of the circulatory system: Secondary | ICD-10-CM

## 2021-04-25 DIAGNOSIS — T380X5A Adverse effect of glucocorticoids and synthetic analogues, initial encounter: Secondary | ICD-10-CM | POA: Diagnosis not present

## 2021-04-25 DIAGNOSIS — J441 Chronic obstructive pulmonary disease with (acute) exacerbation: Secondary | ICD-10-CM | POA: Diagnosis present

## 2021-04-25 DIAGNOSIS — G9341 Metabolic encephalopathy: Secondary | ICD-10-CM | POA: Diagnosis not present

## 2021-04-25 DIAGNOSIS — J449 Chronic obstructive pulmonary disease, unspecified: Secondary | ICD-10-CM

## 2021-04-25 DIAGNOSIS — J9621 Acute and chronic respiratory failure with hypoxia: Secondary | ICD-10-CM | POA: Diagnosis present

## 2021-04-25 DIAGNOSIS — N184 Chronic kidney disease, stage 4 (severe): Secondary | ICD-10-CM | POA: Diagnosis present

## 2021-04-25 DIAGNOSIS — F05 Delirium due to known physiological condition: Secondary | ICD-10-CM | POA: Diagnosis not present

## 2021-04-25 DIAGNOSIS — Z7951 Long term (current) use of inhaled steroids: Secondary | ICD-10-CM

## 2021-04-25 DIAGNOSIS — J439 Emphysema, unspecified: Secondary | ICD-10-CM | POA: Diagnosis present

## 2021-04-25 DIAGNOSIS — I272 Pulmonary hypertension, unspecified: Secondary | ICD-10-CM | POA: Diagnosis present

## 2021-04-25 DIAGNOSIS — J189 Pneumonia, unspecified organism: Secondary | ICD-10-CM | POA: Diagnosis present

## 2021-04-25 DIAGNOSIS — E875 Hyperkalemia: Secondary | ICD-10-CM | POA: Diagnosis present

## 2021-04-25 DIAGNOSIS — Z79899 Other long term (current) drug therapy: Secondary | ICD-10-CM

## 2021-04-25 DIAGNOSIS — F32A Depression, unspecified: Secondary | ICD-10-CM | POA: Diagnosis present

## 2021-04-25 DIAGNOSIS — J9620 Acute and chronic respiratory failure, unspecified whether with hypoxia or hypercapnia: Secondary | ICD-10-CM | POA: Diagnosis present

## 2021-04-25 DIAGNOSIS — N179 Acute kidney failure, unspecified: Secondary | ICD-10-CM | POA: Diagnosis not present

## 2021-04-25 DIAGNOSIS — M1990 Unspecified osteoarthritis, unspecified site: Secondary | ICD-10-CM | POA: Diagnosis present

## 2021-04-25 DIAGNOSIS — G8929 Other chronic pain: Secondary | ICD-10-CM | POA: Diagnosis not present

## 2021-04-25 DIAGNOSIS — Z90710 Acquired absence of both cervix and uterus: Secondary | ICD-10-CM

## 2021-04-25 DIAGNOSIS — R0902 Hypoxemia: Secondary | ICD-10-CM

## 2021-04-25 DIAGNOSIS — E559 Vitamin D deficiency, unspecified: Secondary | ICD-10-CM | POA: Diagnosis present

## 2021-04-25 DIAGNOSIS — R0689 Other abnormalities of breathing: Secondary | ICD-10-CM

## 2021-04-25 DIAGNOSIS — T502X5A Adverse effect of carbonic-anhydrase inhibitors, benzothiadiazides and other diuretics, initial encounter: Secondary | ICD-10-CM | POA: Diagnosis not present

## 2021-04-25 DIAGNOSIS — Z7901 Long term (current) use of anticoagulants: Secondary | ICD-10-CM

## 2021-04-25 DIAGNOSIS — I5033 Acute on chronic diastolic (congestive) heart failure: Secondary | ICD-10-CM | POA: Diagnosis present

## 2021-04-25 DIAGNOSIS — F19959 Other psychoactive substance use, unspecified with psychoactive substance-induced psychotic disorder, unspecified: Secondary | ICD-10-CM | POA: Diagnosis not present

## 2021-04-25 DIAGNOSIS — I4821 Permanent atrial fibrillation: Secondary | ICD-10-CM | POA: Diagnosis present

## 2021-04-25 DIAGNOSIS — F17210 Nicotine dependence, cigarettes, uncomplicated: Secondary | ICD-10-CM | POA: Diagnosis present

## 2021-04-25 DIAGNOSIS — M545 Low back pain, unspecified: Secondary | ICD-10-CM

## 2021-04-25 DIAGNOSIS — I952 Hypotension due to drugs: Secondary | ICD-10-CM | POA: Diagnosis not present

## 2021-04-25 DIAGNOSIS — M4716 Other spondylosis with myelopathy, lumbar region: Secondary | ICD-10-CM

## 2021-04-25 DIAGNOSIS — I130 Hypertensive heart and chronic kidney disease with heart failure and stage 1 through stage 4 chronic kidney disease, or unspecified chronic kidney disease: Principal | ICD-10-CM | POA: Diagnosis present

## 2021-04-25 DIAGNOSIS — F339 Major depressive disorder, recurrent, unspecified: Secondary | ICD-10-CM

## 2021-04-25 DIAGNOSIS — Z841 Family history of disorders of kidney and ureter: Secondary | ICD-10-CM

## 2021-04-25 DIAGNOSIS — Z8542 Personal history of malignant neoplasm of other parts of uterus: Secondary | ICD-10-CM

## 2021-04-25 DIAGNOSIS — Z825 Family history of asthma and other chronic lower respiratory diseases: Secondary | ICD-10-CM

## 2021-04-25 LAB — PROTIME-INR
INR: 3.9 — ABNORMAL HIGH (ref 0.8–1.2)
Prothrombin Time: 38.4 seconds — ABNORMAL HIGH (ref 11.4–15.2)

## 2021-04-25 LAB — TROPONIN I (HIGH SENSITIVITY)
Troponin I (High Sensitivity): 12 ng/L (ref <18)
Troponin I (High Sensitivity): 12 ng/L (ref <18)

## 2021-04-25 LAB — CBC
HCT: 29.3 % — ABNORMAL LOW (ref 36.0–46.0)
Hemoglobin: 9 g/dL — ABNORMAL LOW (ref 12.0–15.0)
MCH: 29.7 pg (ref 26.0–34.0)
MCHC: 30.7 g/dL (ref 30.0–36.0)
MCV: 96.7 fL (ref 80.0–100.0)
Platelets: 252 10*3/uL (ref 150–400)
RBC: 3.03 MIL/uL — ABNORMAL LOW (ref 3.87–5.11)
RDW: 16.7 % — ABNORMAL HIGH (ref 11.5–15.5)
WBC: 11.8 10*3/uL — ABNORMAL HIGH (ref 4.0–10.5)
nRBC: 0 % (ref 0.0–0.2)

## 2021-04-25 LAB — BASIC METABOLIC PANEL
Anion gap: 8 (ref 5–15)
BUN: 37 mg/dL — ABNORMAL HIGH (ref 8–23)
CO2: 26 mmol/L (ref 22–32)
Calcium: 8.4 mg/dL — ABNORMAL LOW (ref 8.9–10.3)
Chloride: 99 mmol/L (ref 98–111)
Creatinine, Ser: 1.96 mg/dL — ABNORMAL HIGH (ref 0.44–1.00)
GFR, Estimated: 26 mL/min — ABNORMAL LOW (ref >60)
Glucose, Bld: 118 mg/dL — ABNORMAL HIGH (ref 70–99)
Potassium: 5.7 mmol/L — ABNORMAL HIGH (ref 3.5–5.1)
Sodium: 133 mmol/L — ABNORMAL LOW (ref 135–145)

## 2021-04-25 LAB — RESP PANEL BY RT-PCR (FLU A&B, COVID) ARPGX2
Influenza A by PCR: NEGATIVE
Influenza B by PCR: NEGATIVE
SARS Coronavirus 2 by RT PCR: NEGATIVE

## 2021-04-25 LAB — BRAIN NATRIURETIC PEPTIDE: B Natriuretic Peptide: 414 pg/mL — ABNORMAL HIGH (ref 0.0–100.0)

## 2021-04-25 LAB — POTASSIUM: Potassium: 5.7 mmol/L — ABNORMAL HIGH (ref 3.5–5.1)

## 2021-04-25 MED ORDER — DULOXETINE HCL 60 MG PO CPEP
120.0000 mg | ORAL_CAPSULE | Freq: Every day | ORAL | Status: DC
  Administered 2021-04-26 – 2021-05-02 (×7): 120 mg via ORAL
  Filled 2021-04-25 (×7): qty 2

## 2021-04-25 MED ORDER — OXYCODONE-ACETAMINOPHEN 5-325 MG PO TABS
2.0000 | ORAL_TABLET | Freq: Three times a day (TID) | ORAL | Status: DC | PRN
  Administered 2021-04-25 – 2021-04-27 (×5): 2 via ORAL
  Administered 2021-04-28: 1 via ORAL
  Administered 2021-04-28: 2 via ORAL
  Administered 2021-04-28 – 2021-04-29 (×2): 1 via ORAL
  Filled 2021-04-25 (×9): qty 2

## 2021-04-25 MED ORDER — FUROSEMIDE 10 MG/ML IJ SOLN
60.0000 mg | Freq: Two times a day (BID) | INTRAMUSCULAR | Status: DC
  Administered 2021-04-26 – 2021-05-01 (×11): 60 mg via INTRAVENOUS
  Filled 2021-04-25 (×11): qty 6

## 2021-04-25 MED ORDER — SODIUM CHLORIDE 0.9 % IV SOLN
1.0000 g | Freq: Once | INTRAVENOUS | Status: AC
  Administered 2021-04-25: 1 g via INTRAVENOUS
  Filled 2021-04-25: qty 10

## 2021-04-25 MED ORDER — DILTIAZEM HCL 25 MG/5ML IV SOLN
10.0000 mg | Freq: Once | INTRAVENOUS | Status: AC
  Administered 2021-04-25: 10 mg via INTRAVENOUS
  Filled 2021-04-25: qty 5

## 2021-04-25 MED ORDER — ATORVASTATIN CALCIUM 40 MG PO TABS
80.0000 mg | ORAL_TABLET | Freq: Every day | ORAL | Status: DC
  Administered 2021-04-26 – 2021-05-02 (×7): 80 mg via ORAL
  Filled 2021-04-25 (×7): qty 2

## 2021-04-25 MED ORDER — WARFARIN - PHARMACIST DOSING INPATIENT: Freq: Every day | Status: DC

## 2021-04-25 MED ORDER — PREDNISONE 20 MG PO TABS
40.0000 mg | ORAL_TABLET | Freq: Every day | ORAL | Status: DC
  Administered 2021-04-27: 40 mg via ORAL
  Filled 2021-04-25 (×2): qty 2

## 2021-04-25 MED ORDER — CALCIUM GLUCONATE-NACL 1-0.675 GM/50ML-% IV SOLN
1.0000 g | Freq: Once | INTRAVENOUS | Status: AC
  Administered 2021-04-25: 1000 mg via INTRAVENOUS
  Filled 2021-04-25: qty 50

## 2021-04-25 MED ORDER — DILTIAZEM HCL 30 MG PO TABS
30.0000 mg | ORAL_TABLET | Freq: Once | ORAL | Status: AC
  Administered 2021-04-25: 30 mg via ORAL
  Filled 2021-04-25: qty 1

## 2021-04-25 MED ORDER — LEVALBUTEROL HCL 1.25 MG/0.5ML IN NEBU
1.2500 mg | INHALATION_SOLUTION | Freq: Once | RESPIRATORY_TRACT | Status: AC
  Administered 2021-04-25: 1.25 mg via RESPIRATORY_TRACT
  Filled 2021-04-25: qty 0.5

## 2021-04-25 MED ORDER — CHLORHEXIDINE GLUCONATE CLOTH 2 % EX PADS
6.0000 | MEDICATED_PAD | Freq: Every day | CUTANEOUS | Status: DC
  Administered 2021-04-26 – 2021-05-01 (×5): 6 via TOPICAL

## 2021-04-25 MED ORDER — IPRATROPIUM BROMIDE 0.02 % IN SOLN
0.5000 mg | Freq: Four times a day (QID) | RESPIRATORY_TRACT | Status: DC
  Administered 2021-04-26 – 2021-04-30 (×19): 0.5 mg via RESPIRATORY_TRACT
  Filled 2021-04-25 (×20): qty 2.5

## 2021-04-25 MED ORDER — PANTOPRAZOLE SODIUM 40 MG PO TBEC
40.0000 mg | DELAYED_RELEASE_TABLET | Freq: Every day | ORAL | Status: DC
  Administered 2021-04-26 – 2021-05-03 (×8): 40 mg via ORAL
  Filled 2021-04-25 (×8): qty 1

## 2021-04-25 MED ORDER — FENTANYL CITRATE PF 50 MCG/ML IJ SOSY
25.0000 ug | PREFILLED_SYRINGE | Freq: Once | INTRAMUSCULAR | Status: AC
  Administered 2021-04-25: 25 ug via INTRAVENOUS
  Filled 2021-04-25: qty 1

## 2021-04-25 MED ORDER — ONDANSETRON HCL 4 MG/2ML IJ SOLN
4.0000 mg | Freq: Once | INTRAMUSCULAR | Status: AC
  Administered 2021-04-25: 4 mg via INTRAVENOUS
  Filled 2021-04-25: qty 2

## 2021-04-25 MED ORDER — DILTIAZEM HCL-DEXTROSE 125-5 MG/125ML-% IV SOLN (PREMIX)
5.0000 mg/h | INTRAVENOUS | Status: DC
  Administered 2021-04-25 – 2021-04-26 (×2): 5 mg/h via INTRAVENOUS
  Administered 2021-04-26 – 2021-04-27 (×3): 15 mg/h via INTRAVENOUS
  Administered 2021-04-28: 7.5 mg/h via INTRAVENOUS
  Administered 2021-04-29: 5 mg/h via INTRAVENOUS
  Filled 2021-04-25 (×6): qty 125

## 2021-04-25 MED ORDER — ASPIRIN EC 81 MG PO TBEC
81.0000 mg | DELAYED_RELEASE_TABLET | Freq: Every day | ORAL | Status: DC
  Administered 2021-04-27 – 2021-05-03 (×7): 81 mg via ORAL
  Filled 2021-04-25 (×8): qty 1

## 2021-04-25 MED ORDER — BUSPIRONE HCL 5 MG PO TABS
5.0000 mg | ORAL_TABLET | Freq: Three times a day (TID) | ORAL | Status: DC | PRN
  Administered 2021-04-26 – 2021-04-29 (×5): 5 mg via ORAL
  Filled 2021-04-25 (×5): qty 1

## 2021-04-25 MED ORDER — GUAIFENESIN ER 600 MG PO TB12
600.0000 mg | ORAL_TABLET | Freq: Two times a day (BID) | ORAL | Status: DC
  Administered 2021-04-26 – 2021-04-30 (×10): 600 mg via ORAL
  Filled 2021-04-25 (×11): qty 1

## 2021-04-25 MED ORDER — SODIUM ZIRCONIUM CYCLOSILICATE 5 G PO PACK
5.0000 g | PACK | Freq: Once | ORAL | Status: AC
  Administered 2021-04-25: 5 g via ORAL
  Filled 2021-04-25: qty 1

## 2021-04-25 MED ORDER — METHYLPREDNISOLONE SODIUM SUCC 125 MG IJ SOLR
60.0000 mg | Freq: Two times a day (BID) | INTRAMUSCULAR | Status: AC
Start: 2021-04-26 — End: 2021-04-26
  Administered 2021-04-26 (×2): 60 mg via INTRAVENOUS
  Filled 2021-04-25 (×2): qty 2

## 2021-04-25 MED ORDER — LEVALBUTEROL HCL 0.63 MG/3ML IN NEBU
0.6300 mg | INHALATION_SOLUTION | Freq: Four times a day (QID) | RESPIRATORY_TRACT | Status: DC | PRN
  Administered 2021-04-26 – 2021-05-01 (×5): 0.63 mg via RESPIRATORY_TRACT
  Filled 2021-04-25 (×5): qty 3

## 2021-04-25 MED ORDER — SODIUM CHLORIDE 0.9 % IV SOLN
1.0000 g | INTRAVENOUS | Status: DC
  Administered 2021-04-26 – 2021-04-29 (×4): 1 g via INTRAVENOUS
  Filled 2021-04-25 (×4): qty 10

## 2021-04-25 MED ORDER — ONDANSETRON HCL 4 MG PO TABS
4.0000 mg | ORAL_TABLET | Freq: Four times a day (QID) | ORAL | Status: DC | PRN
  Administered 2021-05-02: 4 mg via ORAL
  Filled 2021-04-25: qty 1

## 2021-04-25 MED ORDER — MAGNESIUM HYDROXIDE 400 MG/5ML PO SUSP: 15.0000 mL | Freq: Every day | ORAL | Status: DC | PRN

## 2021-04-25 MED ORDER — ONDANSETRON HCL 4 MG/2ML IJ SOLN: 4.0000 mg | Freq: Four times a day (QID) | INTRAMUSCULAR | Status: DC | PRN

## 2021-04-25 MED ORDER — FLUTICASONE-UMECLIDIN-VILANT 100-62.5-25 MCG/INH IN AEPB: 1.0000 | INHALATION_SPRAY | Freq: Every day | RESPIRATORY_TRACT | Status: DC

## 2021-04-25 MED ORDER — METHYLPREDNISOLONE SODIUM SUCC 125 MG IJ SOLR
125.0000 mg | Freq: Once | INTRAMUSCULAR | Status: AC
  Administered 2021-04-25: 125 mg via INTRAVENOUS
  Filled 2021-04-25: qty 2

## 2021-04-25 NOTE — H&P (Signed)
History and Physical  [name] ZJI:[id number] DOB: 1941/06/07 DOA: 04/25/2021  Referring physician: Margarita Mail PA-C, EDP PCP: [name], FNP  Outpatient Specialists:   Patient Coming From: home  Chief Complaint: SOB  HPI: [name] is a 80 y.o. female with a history of chronic respiratory failure on 4 to 5 L home oxygen, COPD with pulmonary hypertension, chronic diastolic heart failure, hypertension, permanent A. fib on Coumadin for anticoagulation, essential hypertension, depression/anxiety.  Patient seen for worsening shortness of breath that started approximately a week ago.  Patient's symptoms have been worsening with increasing difficulty with exertion.  She does have cough with purulent sputum production.  No fevers or chills.  She had a virtual appointment with her PCP, who prescribed prednisone and azithromycin.  Despite this, her symptoms have been worsening.  No other palliating or provoking factors.  She has been compliant with her inhalers and has been using her albuterol inhaler pretty regularly with minimal improvement.  She does report increasing palpitations and feel like her heart beat is beating really fast.  She has been compliant with her Cardizem.  Emergency Department Course: Patient has received a couple boluses of Cardizem.  She is given a couple Xopenex breathing treatments without dramatic improvement.  Potassium 5.7  Review of Systems:   Pt denies any fevers, chills, nausea, vomiting, diarrhea, constipation, abdominal pain, headache, vision changes, lightheadedness, dizziness, melena, rectal bleeding.  Review of systems are otherwise negative  Past Medical History:  Diagnosis Date   Arthritis    COPD (chronic obstructive pulmonary disease) ([place])    spirometry 04/20/10 FEV1 1.38 (58%) with ration 70   Depression    Diastolic dysfunction    noted on echo w/some apparent volume issues in past but not severe    HLD (hyperlipidemia)     HOH (hard of hearing)    HTN (hypertension)    mild; pulmonary   Permanent atrial fibrillation (HCC)    Ruptured disk    Tobacco abuse    Uterine cancer [facility])    Past Surgical History:  Procedure Laterality Date   ABDOMINAL HYSTERECTOMY     total   CATARACT EXTRACTION W/PHACO Left 08/25/2014   Procedure: CATARACT EXTRACTION PHACO AND INTRAOCULAR LENS PLACEMENT ([place]);  Surgeon: Tonny Branch, MD;  Location: [facility];  Service: Ophthalmology;  Laterality: Left;  CDE 6.95   CATARACT EXTRACTION W/PHACO Right 09/11/2014   Procedure: CATARACT EXTRACTION PHACO AND INTRAOCULAR LENS PLACEMENT ([facility]);  Surgeon: Tonny Branch, MD;  Location: [facility];  Service: Ophthalmology;  Laterality: Right;  CDE:8.11   CERVICAL DISC SURGERY     x2   KYPHOPLASTY     SPINE SURGERY  2013   cervical fusion   Social History:  reports that she has quit smoking. Her smoking use included cigarettes. She started smoking about 60 years ago. She smoked an average of 1 pack per day. She has never used smokeless tobacco. She reports that she does not drink alcohol and does not use drugs. Patient lives at home  Allergies  Allergen Reactions   Niaspan [Niacin]     Family History  Problem Relation Age of Onset   Heart disease Mother    Stroke Mother    Diabetes Mother    Heart attack Father    Kidney disease Father    Cancer Father        prostate   Cancer Brother    Heart attack Brother 35   Cancer Brother  prostate   Heart disease Brother        CHF   COPD Brother    Coronary artery disease Other        fhx      Prior to Admission medications   Medication Sig Start Date End Date Taking? Authorizing Provider  albuterol (PROVENTIL) (2.5 MG/3ML) 0.083% nebulizer solution INHALE CONTENTS OF 1 VIAL IN NEBULIZER EVERY 6 HOURS AS NEEDED FOR FOR WHEEZING OR SHORTNESS OF BREATH. Patient taking differently: Take 2.5 mg by nebulization every 6 (six) hours as needed for shortness of breath. 01/12/21   [name],  FNP  albuterol (VENTOLIN HFA) 108 (90 Base) MCG/ACT inhaler INHALE 1-2 PUFFS INTO THE LUNGS EVERY 4 TO 6 HOURS AS NEEDED FOR SHORTNESS OF BREATH  (NEEDS TO BE SEEN BEFORE NEXT REFILL) 04/13/21   [name], FNP  amLODipine (NORVASC) 5 MG tablet TAKE ONE TABLET BY MOUTH ONCE DAILY (MORNING) 04/14/21   [name], FNP  atorvastatin (LIPITOR) 80 MG tablet TAKE ONE TABLET BY MOUTH DAILY AT 6 PM 04/14/21   [name], FNP  azithromycin (ZITHROMAX) 250 MG tablet Take 500 mg once, then 250 mg for four days 04/23/21   [name], FNP  busPIRone (BUSPAR) 5 MG tablet Take 1 tablet (5 mg total) by mouth 3 (three) times daily as needed. 04/13/21   [name], FNP  diazepam (VALIUM) 5 MG tablet Take 5 mg by mouth every 8 (eight) hours as needed for anxiety. 04/16/21   [provider]  diclofenac sodium (VOLTAREN) 1 % GEL Apply 2 g topically 4 (four) times daily. 05/20/19   Baruch Gouty, FNP  diltiazem (CARDIZEM) 30 MG tablet TAKE ONE TABLET BY MOUTH EVERY MORNING AND IN THE EVENING (MORNING,EVENING) 04/14/21   [name], FNP  DULoxetine (CYMBALTA) 60 MG capsule TAKE TWO (2) CAPSULES BY MOUTH DAILY (EVENING) Patient taking differently: Take 60 mg by mouth See admin instructions. Take 120 mg by mouth every evening 04/14/21   [name], FNP  esomeprazole (NEXIUM) 40 MG capsule TAKE ONE CAPSULE BY MOUTH ONCE DAILY (MORNING PER PT) 04/14/21   [name], FNP  fluticasone (FLONASE) 50 MCG/ACT nasal spray USE ONE SPRAY IN EACH NOSTRIL ONCE DAILY. SHAKE GENTLY BEFORE USING. Patient taking differently: Place 1 spray into both nostrils daily. SHAKE GENTLY BEFORE USING. 04/13/20   [name], MD  Fluticasone-Umeclidin-Vilant (TRELEGY ELLIPTA) 100-62.5-25 MCG/INH AEPB Inhale 1 puff into the lungs daily. 04/13/21   [name], FNP  guaiFENesin (MUCINEX) 600 MG 12 hr tablet Take 1 tablet (600 mg total) by mouth 2 (two) times daily as needed. 08/10/16   [name],  MD  loratadine (CLARITIN) 10 MG tablet TAKE ONE TABLET BY MOUTH EVERY DAY (,EVENING) Patient taking differently: Take 10 mg by mouth daily. (EVENING) 11/11/20   [name], FNP  Magnesium Hydroxide (DULCOLAX PO) Take 25 mg by mouth daily.    [provider]  Multiple Vitamins-Minerals (CENTRUM SILVER PO) Take 1 tablet by mouth daily.    [provider]  NON FORMULARY Oxygen 3L/min    [provider]  nystatin cream (MYCOSTATIN) Apply 1 application topically 2 (two) times daily. 12/20/19   [name], FNP  oxyCODONE-acetaminophen (PERCOCET) 5-325 MG per tablet Take 2 tablets by mouth every 8 (eight) hours as needed.    [provider]  potassium chloride (KLOR-CON) 10 MEQ tablet TAKE ONE TABLET BY MOUTH EVERY MORNING 04/14/21  [name], FNP  predniSONE (STERAPRED UNI-PAK 21 TAB) 10 MG (21) TBPK tablet Use as directed 04/23/21   [name], FNP  Spacer/Aero-Holding Chambers DEVI 1 Device by Does not apply route in the morning and at bedtime. 04/13/21   [name], FNP  torsemide (DEMADEX) 20 MG tablet TAKE 1 AND 1/2 TABLETS TO 2 TABLETS BY MOUTH EVERY MORNING ,1& ,1/2 IN PACK EXTRA IN BOTTLE IF NEEDED 04/14/21   [name], FNP  vitamin B-12 (CYANOCOBALAMIN) 1000 MCG tablet TAKE ONE TABLET BY MOUTH DAILY WITH BREAKFAST (MORNING) Patient taking differently: Take 1,000 mcg by mouth daily. 04/14/21   [name], FNP  Vitamin D, Ergocalciferol, (DRISDOL) 1.25 MG (50000 UNIT) CAPS capsule Take 1 capsule (50,000 Units total) by mouth every 7 (seven) days. 07/20/20   [name], FNP  warfarin (COUMADIN) 5 MG tablet TAKE 1 OR 2 TABLETS BY MOUTH AS DIRECTED BY WARFARIN CLINIC (,1-SU,M,W,TH,F,SA/ ,1 &,1/2-TU) 04/14/21   [name], FNP    Physical Exam: BP 99/86   Pulse 93   Temp 97.6 F (36.4 C) (Oral)   Resp (!) 27   Ht 5\' 4"  (1.626 m)   Wt 72.6 kg   SpO2 93%   BMI 27.46 kg/m   General: Elderly female. Awake and  alert and oriented x3. No acute cardiopulmonary distress.  HEENT: Normocephalic atraumatic.  Right and left ears normal in appearance.  Pupils equal, round, reactive to light. Extraocular muscles are intact. Sclerae anicteric and noninjected.  Moist mucosal membranes. No mucosal lesions.  Neck: Neck supple without lymphadenopathy. No carotid bruits. No masses palpated.  Cardiovascular: Irregularly irregular rate. No murmurs, rubs, gallops auscultated. No JVD.  Respiratory: Diminished breath sounds throughout.  Coarse wheezing.  No Rales on exam. No accessory muscle use. Abdomen: Soft, nontender, nondistended. Active bowel sounds. No masses or hepatosplenomegaly  Skin: No rashes, lesions, or ulcerations.  Dry, warm to touch. 2+ dorsalis pedis and radial pulses. Musculoskeletal: No calf or leg pain. All major joints not erythematous nontender.  No upper or lower joint deformation.  Good ROM.  No contractures  Psychiatric: Intact judgment and insight. Pleasant and cooperative. Neurologic: No focal neurological deficits. Strength is 5/5 and symmetric in upper and lower extremities.  Cranial nerves II through XII are grossly intact.           Labs on Admission: I have personally reviewed following labs and imaging studies  CBC: Recent Labs  Lab 04/25/21 1529  WBC 11.8*  HGB 9.0*  HCT 29.3*  MCV 96.7  PLT 532   Basic Metabolic Panel: Recent Labs  Lab 04/25/21 1529 04/25/21 1714  NA 133*  --   K 5.7* 5.7*  CL 99  --   CO2 26  --   GLUCOSE 118*  --   BUN 37*  --   CREATININE 1.96*  --   CALCIUM 8.4*  --    GFR: Estimated Creatinine Clearance: 23.1 mL/min (A) (by C-G formula based on SCr of 1.96 mg/dL (H)). Liver Function Tests: No results for input(s): AST, ALT, ALKPHOS, BILITOT, PROT, ALBUMIN in the last 168 hours. No results for input(s): LIPASE, AMYLASE in the last 168 hours. No results for input(s): AMMONIA in the last 168 hours. Coagulation Profile: Recent Labs  Lab  04/25/21 1529  INR 3.9*   Cardiac Enzymes: No results for input(s): CKTOTAL, CKMB, CKMBINDEX, TROPONINI in the last 168 hours. BNP (last 3 results) No results for input(s): PROBNP in the last  8760 hours. HbA1C: No results for input(s): HGBA1C in the last 72 hours. CBG: No results for input(s): GLUCAP in the last 168 hours. Lipid Profile: No results for input(s): CHOL, HDL, LDLCALC, TRIG, CHOLHDL, LDLDIRECT in the last 72 hours. Thyroid Function Tests: No results for input(s): TSH, T4TOTAL, FREET4, T3FREE, THYROIDAB in the last 72 hours. Anemia Panel: No results for input(s): VITAMINB12, FOLATE, FERRITIN, TIBC, IRON, RETICCTPCT in the last 72 hours. Urine analysis: No results found for: COLORURINE, APPEARANCEUR, LABSPEC, PHURINE, GLUCOSEU, HGBUR, BILIRUBINUR, KETONESUR, PROTEINUR, UROBILINOGEN, NITRITE, LEUKOCYTESUR Sepsis Labs: @LABRCNTIP (procalcitonin:4,lacticidven:4) )No results found for this or any previous visit (from the past 240 hour(s)).   Radiological Exams on Admission: DG Chest Port 1 View  Result Date: 04/25/2021 CLINICAL DATA:  Chest pain EXAM: PORTABLE CHEST 1 VIEW COMPARISON:  09/15/2020 FINDINGS: Cardiomegaly, vascular congestion. Mild hyperinflation of the lungs compatible with COPD. No confluent opacities or effusions. No overt edema. IMPRESSION: Cardiomegaly, vascular congestion. COPD. Electronically Signed   By: Rolm Baptise M.D.   On: 04/25/2021 15:58    EKG: Independently reviewed.  Atrial fibrillation with rapid ventricular rate.  Interventricular conduction delay.  Old inferior infarct.  No acute ST changes.  Assessment/Plan: Active Problems:   Pulmonary hypertension (HCC)   Atrial fibrillation with RVR (HCC)   Permanent atrial fibrillation (HCC)   Pulmonary emphysema (HCC)   GAD (generalized anxiety disorder)   Acute on chronic respiratory failure (HCC)   COPD with acute exacerbation (HCC)   Hyperkalemia    This patient was discussed with the ED  physician, including pertinent vitals, physical exam findings, labs, and imaging.  We also discussed care given by the ED provider.  Acute on chronic respiratory failure with COPD exacerbation, pulmonary emphysema and pulmonary hypertension Antibiotics: Rocephin DuoNeb's every 6 scheduled with albuterol every 2 when necessary Continue inhaled steroids and LA bronchodilator Solu-Medrol 60 mg IV every 12 hours Mucinex A. fib with RVR Start Cardizem drip. Patient's blood pressure did get a little soft at 1 point in the ER due to the boluses of Cardizem.  We will hold amlodipine Generalized anxiety disorder Continue with medications Chronic diastolic heart failure BNP 400. X-ray does suggest vascular congestion Will give Lasix 60 mg IV twice daily - will watch BP closely Will follow potassium and creatinine Hyperkalemia Will give dose of Lokelma Calcium gluconate for cardiac protection  DVT prophylaxis: on coumadin Consultants:  Code Status: full code confirmed by patient Family Communication: Daughter present  Disposition Plan:    Truett Mainland, DO

## 2021-04-25 NOTE — ED Provider Notes (Signed)
[facility] EMERGENCY DEPARTMENT Provider Note   CSN: [id number] Arrival date & time: 04/25/21  1456     History Chief Complaint  Patient presents with   Chest Pain    C/o chest pain for last 2-3 days.  History of SOB with Home O2 @5 -6 liters.  Treated on Friday with z-pack and steroids by PCP for URI.    Melinda Collins is a 80 y.o. female who presents with cp/ palpitations and increased sob x 2-3 days. PMH of COPD with chronic hypoxic respiratory failure on baseline 5 to 6 L, permanent A. fib on Coumadin, hypertension.  Patient states that she has had had about 1 week of productive cough with green sputum, chest pain with coughing.  She has had increased wheezing and shortness of breath and has been using her nebulizer treatment and inhaler at home more frequently.  Her symptoms are worsened with exertion, improved with rest.  She denies any active chest pain with exertion.  Pain is associated with her cough.  She was treated with a Z-Pak and steroid taper by her primary care doctor.  Patient states that today she noticed palpitations and racing heart.  She is on Cardizem for permanent atrial fibrillation.  This brought her to the emergency department   Chest Pain     Past Medical History:  Diagnosis Date   Arthritis    COPD (chronic obstructive pulmonary disease) ([place])    spirometry 04/20/10 FEV1 1.38 (58%) with ration 70   Depression    Diastolic dysfunction    noted on echo w/some apparent volume issues in past but not severe    HLD (hyperlipidemia)    HOH (hard of hearing)    HTN (hypertension)    mild; pulmonary   Permanent atrial fibrillation (HCC)    Ruptured disk    Tobacco abuse    Uterine cancer [facility])     Patient Active Problem List   Diagnosis Date Noted   GAD (generalized anxiety disorder) 04/13/2021   Pulmonary emphysema ([place]) 08/24/2020   Vitamin D deficiency 06/21/2019   Depression, recurrent ([place]) 06/21/2019   Lumbar spondylosis with myelopathy  09/11/2017   Pulmonary nodules 02/08/2017   Aortic atherosclerosis ([place]) 04/22/2016   Permanent atrial fibrillation ([place]) 55/73/2202   Metabolic syndrome 54/27/0623   History of uterine cancer 12/03/2013   Chronic midline low back pain without sciatica 05/23/2013   High risk medication use 05/23/2013   Swelling of limb 05/03/2013   Hyperlipidemia 02/10/2009   TOBACCO ABUSE 02/10/2009   PULMONARY HYPERTENSION 02/10/2009   Atrial fibrillation ([place]) 02/10/2009    Past Surgical History:  Procedure Laterality Date   ABDOMINAL HYSTERECTOMY     total   CATARACT EXTRACTION W/PHACO Left 08/25/2014   Procedure: CATARACT EXTRACTION PHACO AND INTRAOCULAR LENS PLACEMENT ([place]);  Surgeon: Tonny Branch, MD;  Location: [facility];  Service: Ophthalmology;  Laterality: Left;  CDE 6.95   CATARACT EXTRACTION W/PHACO Right 09/11/2014   Procedure: CATARACT EXTRACTION PHACO AND INTRAOCULAR LENS PLACEMENT ([facility]);  Surgeon: Tonny Branch, MD;  Location: [facility];  Service: Ophthalmology;  Laterality: Right;  CDE:8.11   CERVICAL DISC SURGERY     x2   KYPHOPLASTY     SPINE SURGERY  2013   cervical fusion     OB History   No obstetric history on file.     Family History  Problem Relation Age of Onset   Heart disease Mother    Stroke Mother    Diabetes Mother    Heart  attack Father    Kidney disease Father    Cancer Father        prostate   Cancer Brother    Heart attack Brother 24   Cancer Brother        prostate   Heart disease Brother        CHF   COPD Brother    Coronary artery disease Other        fhx    Social History   Tobacco Use   Smoking status: Former    Packs/day: 1.00    Types: Cigarettes    Start date: 07/18/1960   Smokeless tobacco: Never   Tobacco comments:    Qut last week.  08/10/16 restarted smoking cigarettes  Vaping Use   Vaping Use: Never used  Substance Use Topics   Alcohol use: No    Alcohol/week: 0.0 standard drinks   Drug use: No    Home Medications Prior to  Admission medications   Medication Sig Start Date End Date Taking? Authorizing Provider  albuterol (PROVENTIL) (2.5 MG/3ML) 0.083% nebulizer solution INHALE CONTENTS OF 1 VIAL IN NEBULIZER EVERY 6 HOURS AS NEEDED FOR FOR WHEEZING OR SHORTNESS OF BREATH. 01/12/21   [name], FNP  albuterol (VENTOLIN HFA) 108 (90 Base) MCG/ACT inhaler INHALE 1-2 PUFFS INTO THE LUNGS EVERY 4 TO 6 HOURS AS NEEDED FOR SHORTNESS OF BREATH  (NEEDS TO BE SEEN BEFORE NEXT REFILL) 04/13/21   [name], FNP  amLODipine (NORVASC) 5 MG tablet TAKE ONE TABLET BY MOUTH ONCE DAILY (MORNING) 04/14/21   [name], FNP  atorvastatin (LIPITOR) 80 MG tablet TAKE ONE TABLET BY MOUTH DAILY AT 6 PM 04/14/21   [name], FNP  azithromycin (ZITHROMAX) 250 MG tablet Take 500 mg once, then 250 mg for four days 04/23/21   [name], FNP  busPIRone (BUSPAR) 5 MG tablet Take 1 tablet (5 mg total) by mouth 3 (three) times daily as needed. 04/13/21   [name], FNP  diclofenac sodium (VOLTAREN) 1 % GEL Apply 2 g topically 4 (four) times daily. 05/20/19   Baruch Gouty, FNP  diltiazem (CARDIZEM) 30 MG tablet TAKE ONE TABLET BY MOUTH EVERY MORNING AND IN THE EVENING (MORNING,EVENING) 04/14/21   [name], FNP  DULoxetine (CYMBALTA) 60 MG capsule TAKE TWO (2) CAPSULES BY MOUTH DAILY (EVENING) 04/14/21   [name], FNP  esomeprazole (NEXIUM) 40 MG capsule TAKE ONE CAPSULE BY MOUTH ONCE DAILY (MORNING PER PT) 04/14/21   [name], FNP  fluticasone (FLONASE) 50 MCG/ACT nasal spray USE ONE SPRAY IN EACH NOSTRIL ONCE DAILY. SHAKE GENTLY BEFORE USING. Patient taking differently: USE ONE SPRAY IN EACH NOSTRIL ONCE DAILY.  SHAKE GENTLY BEFORE USING. 04/13/20   [name], MD  Fluticasone-Umeclidin-Vilant (TRELEGY ELLIPTA) 100-62.5-25 MCG/INH AEPB Inhale 1 puff into the lungs daily. 04/13/21   [name], FNP  guaiFENesin (MUCINEX) 600 MG 12 hr tablet Take 1 tablet (600 mg total) by mouth 2 (two)  times daily as needed. 08/10/16   [name], MD  loratadine (CLARITIN) 10 MG tablet TAKE ONE TABLET BY MOUTH EVERY DAY (,EVENING) 11/11/20   [name], FNP  Magnesium Hydroxide (DULCOLAX PO) Take 25 mg by mouth daily.    [provider]  Multiple Vitamins-Minerals (CENTRUM SILVER PO) Take 1 tablet by mouth daily.    [provider]  NON FORMULARY Oxygen 3L/min    [provider]  nystatin cream (MYCOSTATIN) Apply 1  application topically 2 (two) times daily. 12/20/19   [name], FNP  oxyCODONE-acetaminophen (PERCOCET) 5-325 MG per tablet Take 2 tablets by mouth every 8 (eight) hours as needed.    [provider]  potassium chloride (KLOR-CON) 10 MEQ tablet TAKE ONE TABLET BY MOUTH EVERY MORNING 04/14/21   [name], FNP  predniSONE (STERAPRED UNI-PAK 21 TAB) 10 MG (21) TBPK tablet Use as directed 04/23/21   [name], FNP  Spacer/Aero-Holding Chambers DEVI 1 Device by Does not apply route in the morning and at bedtime. 04/13/21   [name], FNP  torsemide (DEMADEX) 20 MG tablet TAKE 1 AND 1/2 TABLETS TO 2 TABLETS BY MOUTH EVERY MORNING ,1& ,1/2 IN PACK EXTRA IN BOTTLE IF NEEDED 04/14/21   [name], FNP  vitamin B-12 (CYANOCOBALAMIN) 1000 MCG tablet TAKE ONE TABLET BY MOUTH DAILY WITH BREAKFAST (MORNING) 04/14/21   [name], FNP  Vitamin D, Ergocalciferol, (DRISDOL) 1.25 MG (50000 UNIT) CAPS capsule Take 1 capsule (50,000 Units total) by mouth every 7 (seven) days. 07/20/20   [name], FNP  warfarin (COUMADIN) 5 MG tablet TAKE 1 OR 2 TABLETS BY MOUTH AS DIRECTED BY WARFARIN CLINIC (,1-SU,M,W,TH,F,SA/ ,1 &,1/2-TU) 04/14/21   [name], FNP    Allergies    Niaspan [niacin]  Review of Systems   Review of Systems  Cardiovascular:  Positive for chest pain.  Ten systems reviewed and are negative for acute change, except as noted in the HPI.   Physical Exam Updated Vital Signs BP 129/76   Pulse (!) 120    Temp 97.6 F (36.4 C) (Oral)   Resp (!) 22   Ht 5\' 4"  (1.626 m)   Wt 72.6 kg   SpO2 96%   BMI 27.46 kg/m   Physical Exam Vitals and nursing note reviewed.  Constitutional:      General: She is not in acute distress.    Appearance: She is well-developed. She is ill-appearing. She is not toxic-appearing or diaphoretic.     Interventions: Nasal cannula in place.  HENT:     Head: Normocephalic and atraumatic.     Right Ear: External ear normal.     Left Ear: External ear normal.     Nose: Nose normal.     Mouth/Throat:     Mouth: Mucous membranes are moist.  Eyes:     General: No scleral icterus.    Extraocular Movements: Extraocular movements intact.     Conjunctiva/sclera: Conjunctivae normal.     Pupils: Pupils are equal, round, and reactive to light.  Cardiovascular:     Rate and Rhythm: Normal rate and regular rhythm.     Heart sounds: Normal heart sounds. No murmur heard.   No friction rub. No gallop.  Pulmonary:     Effort: Pulmonary effort is normal. No respiratory distress.     Breath sounds: Wheezing and rhonchi present.  Abdominal:     General: Bowel sounds are normal. There is no distension.     Palpations: Abdomen is soft. There is no mass.     Tenderness: There is no abdominal tenderness. There is no guarding.  Musculoskeletal:     Cervical back: Normal range of motion.  Skin:    General: Skin is warm and dry.  Neurological:     Mental Status: She is alert and oriented to person, place, and time.  Psychiatric:        Behavior: Behavior normal.    ED Results /  Procedures / Treatments   Labs (all labs ordered are listed, but only abnormal results are displayed) Labs Reviewed  BASIC METABOLIC PANEL - Abnormal; Notable for the following components:      Result Value   Sodium 133 (*)    Potassium 5.7 (*)    Glucose, Bld 118 (*)    BUN 37 (*)    Creatinine, Ser 1.96 (*)    Calcium 8.4 (*)    GFR, Estimated 26 (*)    All other components within normal  limits  CBC - Abnormal; Notable for the following components:   WBC 11.8 (*)    RBC 3.03 (*)    Hemoglobin 9.0 (*)    HCT 29.3 (*)    RDW 16.7 (*)    All other components within normal limits  PROTIME-INR - Abnormal; Notable for the following components:   Prothrombin Time 38.4 (*)    INR 3.9 (*)    All other components within normal limits  TROPONIN I (HIGH SENSITIVITY)    EKG EKG Interpretation  Date/Time:  Sunday April 25 2021 15:34:20 EDT Ventricular Rate:  118 PR Interval:    QRS Duration: 137 QT Interval:  339 QTC Calculation: 475 R Axis:   -68 Text Interpretation: Atrial fibrillation RBBB and LAFB Baseline wander in lead(s) V2 Confirmed by Hong, Joshua ([id number]) on 04/25/2021 3:52:05 PM  Radiology DG Chest Port 1 View  Result Date: 04/25/2021 CLINICAL DATA:  Chest pain EXAM: PORTABLE CHEST 1 VIEW COMPARISON:  09/15/2020 FINDINGS: Cardiomegaly, vascular congestion. Mild hyperinflation of the lungs compatible with COPD. No confluent opacities or effusions. No overt edema. IMPRESSION: Cardiomegaly, vascular congestion. COPD. Electronically Signed   By: Kevin  Dover M.D.   On: 04/25/2021 15:58    Procedures Procedures   Medications Ordered in ED Medications  diltiazem (CARDIZEM) injection 10 mg (has no administration in time range)  levalbuterol (XOPENEX) nebulizer solution 1.25 mg (has no administration in time range)  fentaNYL (SUBLIMAZE) injection 25 mcg (25 mcg Intravenous Given 04/25/21 1557)  ondansetron (ZOFRAN) injection 4 mg (4 mg Intravenous Given 04/25/21 1554)    ED Course  I have reviewed the triage vital signs and the nursing notes.  Pertinent labs & imaging results that were available during my care of the patient were reviewed by me and considered in my medical decision making (see chart for details).    MDM Rules/Calculators/A&P                           79  year old female with history of permanent A. Fib. who complains of palpitations. The  differential diagnosis for palpitations includes cardiac arrhythmias, PVC/PAC, ACS, Cardiomyopathy, CHF, MVP, pericarditis, valvular disease, Panic/Anxiety, Somatic disorder, ETOH, Caffeine,  Stimulant use, medication side effect, Anemia, Hyperthyroidism, pulmonary embolism. I ordered and reviewed labs that include a CBC which shows normocytic anemia, BMP with renal insufficiency, potassium is mildly elevated and I have ordered repeat testing. Troponin within normal limits x2, respiratory panel negative for COVID or influenza, PT/INR shows supratherapeutic INR. I ordered and reviewed 1 view chest x-ray which has been interpreted as pulmonary edema by radiology.  Patient appears to be in A. fib with RVR and this could have caused some increased pulmonary edema however her clinical examination is also consistent with COPD exacerbation. I ordered and reviewed a twelve-lead EKG which shows A. fib with RVR at a rate of 128. Patient course is difficult because she has been persistently in RVR with any minimal  exertion and needs treatment for COPD.  I therefore tried treatment with Xopenex which made the patient feel better however she continues to need treatment.  Patient received 2 doses of IV diltiazem at 10 mg and then oral diltiazem at 30 mg which did not improve her heart rate significantly.  Given these factors, potential pulmonary edema, potential community-acquired pneumonia and COPD exacerbation patient will need admission for further management.  I discussed case with Dr. Sheran Lawless who will admit the patient.  Clinical Impression(s) / ED Diagnoses Final diagnoses:  None    Rx / DC Orders ED Discharge Orders     None        Margarita Mail, PA-C 04/26/21 [place], MD 05/05/21 [phone]

## 2021-04-25 NOTE — ED Notes (Signed)
Took Pt's EKG to the doctor.

## 2021-04-25 NOTE — Progress Notes (Signed)
ANTICOAGULATION CONSULT NOTE - Initial Consult  Pharmacy Consult for Warfarin (home med) Indication: atrial fibrillation  Allergies  Allergen Reactions   Niaspan [Niacin]     Patient Measurements: Height: 5\' 4"  (162.6 cm) Weight: 72.6 kg (160 lb) IBW/kg (Calculated) : 54.7  Vital Signs: Temp: 97.2 F (36.2 C) (10/09 1909) Temp Source: Axillary (10/09 1909) BP: 107/87 (10/09 1909) Pulse Rate: 139 (10/09 1909)  Labs: Recent Labs    04/25/21 1529 04/25/21 1714  HGB 9.0*  --   HCT 29.3*  --   PLT 252  --   LABPROT 38.4*  --   INR 3.9*  --   CREATININE 1.96*  --   TROPONINIHS 12 12    Estimated Creatinine Clearance: 23.1 mL/min (A) (by C-G formula based on SCr of 1.96 mg/dL (H)).   Medical History: Past Medical History:  Diagnosis Date   Arthritis    COPD (chronic obstructive pulmonary disease) ([place])    spirometry 04/20/10 FEV1 1.38 (58%) with ration 70   Depression    Diastolic dysfunction    noted on echo w/some apparent volume issues in past but not severe    HLD (hyperlipidemia)    HOH (hard of hearing)    HTN (hypertension)    mild; pulmonary   Permanent atrial fibrillation (HCC)    Ruptured disk    Tobacco abuse    Uterine cancer (HCC)     Medications:  (Not in a hospital admission)   Assessment: INR SUPRAtherapeutic on admission.  No warfarin today.  Goal of Therapy:  INR 2-3 Monitor platelets by anticoagulation protocol: Yes   Plan:  No warfarin today Monitor INR daily  Hart Robinsons A 04/25/2021,7:27 PM

## 2021-04-25 NOTE — Plan of Care (Signed)

## 2021-04-26 ENCOUNTER — Inpatient Hospital Stay (HOSPITAL_COMMUNITY): Payer: Medicare Other

## 2021-04-26 ENCOUNTER — Other Ambulatory Visit: Payer: Self-pay | Admitting: Family

## 2021-04-26 DIAGNOSIS — E875 Hyperkalemia: Secondary | ICD-10-CM | POA: Diagnosis not present

## 2021-04-26 DIAGNOSIS — I4891 Unspecified atrial fibrillation: Secondary | ICD-10-CM

## 2021-04-26 DIAGNOSIS — J441 Chronic obstructive pulmonary disease with (acute) exacerbation: Secondary | ICD-10-CM | POA: Diagnosis not present

## 2021-04-26 DIAGNOSIS — J9620 Acute and chronic respiratory failure, unspecified whether with hypoxia or hypercapnia: Secondary | ICD-10-CM | POA: Diagnosis not present

## 2021-04-26 LAB — BASIC METABOLIC PANEL
Anion gap: 6 (ref 5–15)
Anion gap: 9 (ref 5–15)
BUN: 42 mg/dL — ABNORMAL HIGH (ref 8–23)
BUN: 47 mg/dL — ABNORMAL HIGH (ref 8–23)
CO2: 30 mmol/L (ref 22–32)
CO2: 27 mmol/L (ref 22–32)
Calcium: 8.9 mg/dL (ref 8.9–10.3)
Calcium: 8.7 mg/dL — ABNORMAL LOW (ref 8.9–10.3)
Chloride: 101 mmol/L (ref 98–111)
Chloride: 101 mmol/L (ref 98–111)
Creatinine, Ser: 1.99 mg/dL — ABNORMAL HIGH (ref 0.44–1.00)
Creatinine, Ser: 2.13 mg/dL — ABNORMAL HIGH (ref 0.44–1.00)
GFR, Estimated: 25 mL/min — ABNORMAL LOW (ref >60)
GFR, Estimated: 23 mL/min — ABNORMAL LOW (ref >60)
Glucose, Bld: 178 mg/dL — ABNORMAL HIGH (ref 70–99)
Glucose, Bld: 154 mg/dL — ABNORMAL HIGH (ref 70–99)
Potassium: 6 mmol/L — ABNORMAL HIGH (ref 3.5–5.1)
Potassium: 4.8 mmol/L (ref 3.5–5.1)
Sodium: 137 mmol/L (ref 135–145)
Sodium: 137 mmol/L (ref 135–145)

## 2021-04-26 LAB — ECHOCARDIOGRAM COMPLETE
AR max vel: 1.85 cm2
AV Area VTI: 1.62 cm2
AV Area mean vel: 1.55 cm2
AV Mean grad: 15.5 mmHg
AV Peak grad: 28.9 mmHg
Ao pk vel: 2.69 m/s
Area-P 1/2: 4.99 cm2
Height: 64 in
MV VTI: 2.37 cm2
S' Lateral: 3.4 cm
Weight: 2550.28 oz

## 2021-04-26 LAB — PROTIME-INR
INR: 3.7 — ABNORMAL HIGH (ref 0.8–1.2)
Prothrombin Time: 36.5 seconds — ABNORMAL HIGH (ref 11.4–15.2)

## 2021-04-26 LAB — CBC
HCT: 30 % — ABNORMAL LOW (ref 36.0–46.0)
Hemoglobin: 8.9 g/dL — ABNORMAL LOW (ref 12.0–15.0)
MCH: 28.7 pg (ref 26.0–34.0)
MCHC: 29.7 g/dL — ABNORMAL LOW (ref 30.0–36.0)
MCV: 96.8 fL (ref 80.0–100.0)
Platelets: 206 10*3/uL (ref 150–400)
RBC: 3.1 MIL/uL — ABNORMAL LOW (ref 3.87–5.11)
RDW: 16.6 % — ABNORMAL HIGH (ref 11.5–15.5)
WBC: 7.2 10*3/uL (ref 4.0–10.5)
nRBC: 0 % (ref 0.0–0.2)

## 2021-04-26 LAB — MRSA NEXT GEN BY PCR, NASAL: MRSA by PCR Next Gen: NOT DETECTED

## 2021-04-26 LAB — POTASSIUM
Potassium: 5.7 mmol/L — ABNORMAL HIGH (ref 3.5–5.1)
Potassium: 5.1 mmol/L (ref 3.5–5.1)
Potassium: 4.8 mmol/L (ref 3.5–5.1)
Potassium: 4.8 mmol/L (ref 3.5–5.1)

## 2021-04-26 MED ORDER — SODIUM ZIRCONIUM CYCLOSILICATE 10 G PO PACK
10.0000 g | PACK | Freq: Once | ORAL | Status: AC
  Administered 2021-04-26: 10 g via ORAL
  Filled 2021-04-26: qty 1

## 2021-04-26 MED ORDER — INSULIN ASPART 100 UNIT/ML IV SOLN
5.0000 [IU] | Freq: Once | INTRAVENOUS | Status: AC
  Administered 2021-04-26: 5 [IU] via INTRAVENOUS

## 2021-04-26 MED ORDER — FLUTICASONE FUROATE-VILANTEROL 100-25 MCG/INH IN AEPB
1.0000 | INHALATION_SPRAY | Freq: Every day | RESPIRATORY_TRACT | Status: DC
  Administered 2021-04-26 – 2021-05-03 (×7): 1 via RESPIRATORY_TRACT
  Filled 2021-04-26: qty 28

## 2021-04-26 MED ORDER — DEXTROSE 50 % IV SOLN
1.0000 | Freq: Once | INTRAVENOUS | Status: AC
  Administered 2021-04-26: 50 mL via INTRAVENOUS
  Filled 2021-04-26: qty 50

## 2021-04-26 MED ORDER — SODIUM CHLORIDE 0.9 % IV BOLUS
500.0000 mL | Freq: Once | INTRAVENOUS | Status: AC
  Administered 2021-04-26: 500 mL via INTRAVENOUS

## 2021-04-26 MED ORDER — UMECLIDINIUM BROMIDE 62.5 MCG/INH IN AEPB
1.0000 | INHALATION_SPRAY | Freq: Every day | RESPIRATORY_TRACT | Status: DC
  Administered 2021-04-26 – 2021-05-01 (×5): 1 via RESPIRATORY_TRACT
  Filled 2021-04-26: qty 7

## 2021-04-26 NOTE — Plan of Care (Signed)

## 2021-04-26 NOTE — Progress Notes (Signed)
ANTICOAGULATION CONSULT NOTE - Initial Consult  Pharmacy Consult for Warfarin (home med) Indication: atrial fibrillation  Allergies  Allergen Reactions   Niaspan [Niacin]     Patient Measurements: Height: 5\' 4"  (162.6 cm) Weight: 72.3 kg (159 lb 6.3 oz) IBW/kg (Calculated) : 54.7  Vital Signs: Temp: 97.5 F (36.4 C) (10/10 0700) Temp Source: Oral (10/10 0700) BP: 117/68 (10/10 0818) Pulse Rate: 70 (10/10 0819)  Labs: Recent Labs    04/25/21 1529 04/25/21 1714 04/26/21 0416  HGB 9.0*  --  8.9*  HCT 29.3*  --  30.0*  PLT 252  --  206  LABPROT 38.4*  --  36.5*  INR 3.9*  --  3.7*  CREATININE 1.96*  --  1.99*  TROPONINIHS 12 12  --      Estimated Creatinine Clearance: 22.7 mL/min (A) (by C-G formula based on SCr of 1.99 mg/dL (H)).   Medical History: Past Medical History:  Diagnosis Date   Arthritis    COPD (chronic obstructive pulmonary disease) ([place])    spirometry 04/20/10 FEV1 1.38 (58%) with ration 70   Depression    Diastolic dysfunction    noted on echo w/some apparent volume issues in past but not severe    HLD (hyperlipidemia)    HOH (hard of hearing)    HTN (hypertension)    mild; pulmonary   Permanent atrial fibrillation (HCC)    Ruptured disk    Tobacco abuse    Uterine cancer (HCC)     Medications:  Medications Prior to Admission  Medication Sig Dispense Refill Last Dose   albuterol (PROVENTIL) (2.5 MG/3ML) 0.083% nebulizer solution INHALE CONTENTS OF 1 VIAL IN NEBULIZER EVERY 6 HOURS AS NEEDED FOR FOR WHEEZING OR SHORTNESS OF BREATH. (Patient taking differently: Take 2.5 mg by nebulization every 6 (six) hours as needed for shortness of breath.) 90 mL 5    albuterol (VENTOLIN HFA) 108 (90 Base) MCG/ACT inhaler INHALE 1-2 PUFFS INTO THE LUNGS EVERY 4 TO 6 HOURS AS NEEDED FOR SHORTNESS OF BREATH  (NEEDS TO BE SEEN BEFORE NEXT REFILL) 8.5 g 3    amLODipine (NORVASC) 5 MG tablet TAKE ONE TABLET BY MOUTH ONCE DAILY (MORNING) 30 tablet 5     atorvastatin (LIPITOR) 80 MG tablet TAKE ONE TABLET BY MOUTH DAILY AT 6 PM 30 tablet 0    azithromycin (ZITHROMAX) 250 MG tablet Take 500 mg once, then 250 mg for four days 6 tablet 0    busPIRone (BUSPAR) 5 MG tablet Take 1 tablet (5 mg total) by mouth 3 (three) times daily as needed. 90 tablet 2    diazepam (VALIUM) 5 MG tablet Take 5 mg by mouth every 8 (eight) hours as needed for anxiety.      diclofenac sodium (VOLTAREN) 1 % GEL Apply 2 g topically 4 (four) times daily. 350 g 3    diltiazem (CARDIZEM) 30 MG tablet TAKE ONE TABLET BY MOUTH EVERY MORNING AND IN THE EVENING (MORNING,EVENING) 60 tablet 0    DULoxetine (CYMBALTA) 60 MG capsule TAKE TWO (2) CAPSULES BY MOUTH DAILY (EVENING) (Patient taking differently: Take 60 mg by mouth See admin instructions. Take 120 mg by mouth every evening) 60 capsule 5    esomeprazole (NEXIUM) 40 MG capsule TAKE ONE CAPSULE BY MOUTH ONCE DAILY (MORNING PER PT) 30 capsule 5    fluticasone (FLONASE) 50 MCG/ACT nasal spray USE ONE SPRAY IN EACH NOSTRIL ONCE DAILY. SHAKE GENTLY BEFORE USING. (Patient taking differently: Place 1 spray into both nostrils daily. SHAKE  GENTLY BEFORE USING.) 16 g 2    Fluticasone-Umeclidin-Vilant (TRELEGY ELLIPTA) 100-62.5-25 MCG/INH AEPB Inhale 1 puff into the lungs daily. 3 each 4    guaiFENesin (MUCINEX) 600 MG 12 hr tablet Take 1 tablet (600 mg total) by mouth 2 (two) times daily as needed. 60 tablet 1    loratadine (CLARITIN) 10 MG tablet TAKE ONE TABLET BY MOUTH EVERY DAY (,EVENING) (Patient taking differently: Take 10 mg by mouth daily. (EVENING)) 30 tablet 5    Magnesium Hydroxide (DULCOLAX PO) Take 25 mg by mouth daily.      Multiple Vitamins-Minerals (CENTRUM SILVER PO) Take 1 tablet by mouth daily.      NON FORMULARY Oxygen 3L/min      nystatin cream (MYCOSTATIN) Apply 1 application topically 2 (two) times daily. 30 g 6    oxyCODONE-acetaminophen (PERCOCET) 5-325 MG per tablet Take 2 tablets by mouth every 8 (eight) hours as  needed.      potassium chloride (KLOR-CON) 10 MEQ tablet TAKE ONE TABLET BY MOUTH EVERY MORNING 30 tablet 0    predniSONE (STERAPRED UNI-PAK 21 TAB) 10 MG (21) TBPK tablet Use as directed 21 tablet 0    Spacer/Aero-Holding Chambers DEVI 1 Device by Does not apply route in the morning and at bedtime. 1 each 1    torsemide (DEMADEX) 20 MG tablet TAKE 1 AND 1/2 TABLETS TO 2 TABLETS BY MOUTH EVERY MORNING ,1& ,1/2 IN PACK EXTRA IN BOTTLE IF NEEDED 45 tablet 0    vitamin B-12 (CYANOCOBALAMIN) 1000 MCG tablet TAKE ONE TABLET BY MOUTH DAILY WITH BREAKFAST (MORNING) (Patient taking differently: Take 1,000 mcg by mouth daily.) 30 tablet 5    Vitamin D, Ergocalciferol, (DRISDOL) 1.25 MG (50000 UNIT) CAPS capsule Take 1 capsule (50,000 Units total) by mouth every 7 (seven) days. 12 capsule 3    warfarin (COUMADIN) 5 MG tablet TAKE 1 OR 2 TABLETS BY MOUTH AS DIRECTED BY WARFARIN CLINIC (,1-SU,M,W,TH,F,SA/ ,1 &,1/2-TU) 180 tablet 0     Assessment: INR SUPRAtherapeutic on admission.  No warfarin today.  Goal of Therapy:  INR 2-3 Monitor platelets by anticoagulation protocol: Yes   Plan:  No warfarin today Monitor INR daily  Donna Christen Maaz Spiering 04/26/2021,9:10 AM

## 2021-04-26 NOTE — Progress Notes (Signed)
*  PRELIMINARY RESULTS* Echocardiogram 2D Echocardiogram has been performed.  Melinda Collins 04/26/2021, 9:30 AM

## 2021-04-26 NOTE — Progress Notes (Signed)
Patient Demographics:    Melinda Collins, is a 80 y.o. female, DOB - 1941-05-10, NOI:[id number]  Admit date - 04/25/2021   Admitting Physician No admitting provider for patient encounter.  Outpatient Primary MD for the patient is [name], FNP  LOS - 1   Chief Complaint  Patient presents with   Chest Pain    C/o chest pain for last 2-3 days.  History of SOB with Home O2 @5 -6 liters.  Treated on Friday with z-pack and steroids by PCP for URI.        Subjective:    [name] today has no fevers, no emesis,  No chest pain,    Patient's son at bedside, questions answered  Assessment  & Plan :    Active Problems:   Pulmonary hypertension (HCC)   Atrial fibrillation with RVR (HCC)   Permanent atrial fibrillation (HCC)   Pulmonary emphysema (HCC)   GAD (generalized anxiety disorder)   Acute on chronic respiratory failure (HCC)   COPD with acute exacerbation (HCC)   Hyperkalemia  Brief Summary:- 80 y.o. female with a history of chronic respiratory failure on 4 to 5 L home oxygen, COPD with pulmonary hypertension, chronic diastolic heart failure, hypertension, permanent A. fib on Coumadin for anticoagulation, essential hypertension, depression/anxiety  A/p 1) chronic A. fib with RVR--tachycardia persist, continue IV Cardizem for rate control -Echo with EF of 55 to 60%, severe biatrial enlargement, moderate aortic -Continue anticoagulation with Coumadin  2) chronic hypoxic respiratory failure/COPD--- continue supplemental oxygen at 4 to 5 L, continue bronchodilators  3)HFpEF--patient with chronic diastolic dysfunction CHF, echo as above #1 -Continue diuresis  4)Hyperkalemia--improved with Lokelma  Disposition/Need for in-Hospital Stay- patient unable to be discharged at this time due to --hyperkalemia with A. fib with RVR requiring interventions*  Status is: Inpatient  Remains  inpatient appropriate because: To see disposition above  Disposition: The patient is from: Home              Anticipated d/c is to: Home              Anticipated d/c date is: 2 days              Patient currently is not medically stable to d/c. Barriers: Not Clinically Stable-   Code Status : \ -  Code Status: Full Code   Family Communication:    (patient is alert, awake and coherent)  Discussed with son at bedside  Consults  :  na  DVT Prophylaxis  :   - SCDs /coumadin      Lab Results  Component Value Date   PLT 206 04/26/2021    Inpatient Medications  Scheduled Meds:  aspirin EC  81 mg Oral Daily   atorvastatin  80 mg Oral q1800   Chlorhexidine Gluconate Cloth  6 each Topical Q0600   DULoxetine  120 mg Oral q1800   fluticasone furoate-vilanterol  1 puff Inhalation Daily   And   umeclidinium bromide  1 puff Inhalation Daily   furosemide  60 mg Intravenous BID   guaiFENesin  600 mg Oral BID   ipratropium  0.5 mg Nebulization Q6H   pantoprazole  40 mg Oral Daily   [START ON 04/27/2021] predniSONE  40 mg Oral  Q breakfast   Warfarin - Pharmacist Dosing Inpatient   Does not apply q1600   Continuous Infusions:  cefTRIAXone (ROCEPHIN)  IV Stopped (04/26/21 1706)   diltiazem (CARDIZEM) infusion 15 mg/hr (04/26/21 1816)   PRN Meds:.busPIRone, levalbuterol, magnesium hydroxide, ondansetron **OR** ondansetron (ZOFRAN) IV, oxyCODONE-acetaminophen    Anti-infectives (From admission, onward)    Start     Dose/Rate Route Frequency Ordered Stop   04/26/21 1600  cefTRIAXone (ROCEPHIN) 1 g in sodium chloride 0.9 % 100 mL IVPB        1 g 200 mL/hr over 30 Minutes Intravenous Every 24 hours 04/25/21 2241 05/01/21 1559   04/25/21 1700  cefTRIAXone (ROCEPHIN) 1 g in sodium chloride 0.9 % 100 mL IVPB        1 g 200 mL/hr over 30 Minutes Intravenous  Once 04/25/21 1647 04/25/21 1836         Objective:   Vitals:   04/26/21 1555 04/26/21 1600 04/26/21 1615 04/26/21 1800  BP:   (!) 126/94  (!) 107/91  Pulse:  64 62 63  Resp:  12 16 16   Temp: 98 F (36.7 C)     TempSrc: Oral     SpO2:  92% 97% 97%  Weight:      Height:        Wt Readings from Last 3 Encounters:  04/25/21 72.3 kg  04/08/21 71.7 kg  12/28/20 71.9 kg     Intake/Output Summary (Last 24 hours) at 04/26/2021 1908 Last data filed at 04/26/2021 1816 Gross per 24 hour  Intake 972.94 ml  Output 400 ml  Net 572.94 ml     Physical Exam  Gen:- Awake Alert,  in no apparent distress  HEENT:- [place].AT, No sclera icterus Nose- [place] 4L/min Neck-Supple Neck,No JVD,.  Lungs-fair air movement, no significant wheezing  CV- S1, S2 normal, irregularly irregular and tachycardic  abd-  +ve B.Sounds, Abd Soft, No tenderness,    Extremity/Skin:- No  edema, pedal pulses present  Psych-affect is appropriate, oriented x3 Neuro-generalized weakness, no new focal deficits, no tremors   Data Review:   Micro Results Recent Results (from the past 240 hour(s))  Resp Panel by RT-PCR (Flu A&B, Covid) Nasopharyngeal Swab     Status: None   Collection Time: 04/25/21  7:56 PM   Specimen: Nasopharyngeal Swab; Nasopharyngeal(NP) swabs in vial transport medium  Result Value Ref Range Status   SARS Coronavirus 2 by RT PCR NEGATIVE NEGATIVE Final    Comment: (NOTE) SARS-CoV-2 target nucleic acids are NOT DETECTED.  The SARS-CoV-2 RNA is generally detectable in upper respiratory specimens during the acute phase of infection. The lowest concentration of SARS-CoV-2 viral copies this assay can detect is 138 copies/mL. A negative result does not preclude SARS-Cov-2 infection and should not be used as the sole basis for treatment or other patient management decisions. A negative result may occur with  improper specimen collection/handling, submission of specimen other than nasopharyngeal swab, presence of viral mutation(s) within the areas targeted by this assay, and inadequate number of viral copies(<138 copies/mL). A  negative result must be combined with clinical observations, patient history, and epidemiological information. The expected result is Negative.  Fact Sheet for Patients:  EntrepreneurPulse.com.au  Fact Sheet for Healthcare Providers:  IncredibleEmployment.be  This test is no t yet approved or cleared by the [place] FDA and  has been authorized for detection and/or diagnosis of SARS-CoV-2 by FDA under an Emergency Use Authorization (EUA). This EUA will remain  in effect (meaning this  test can be used) for the duration of the COVID-19 declaration under Section 564(b)(1) of the Act, 21 U.S.C.section 360bbb-3(b)(1), unless the authorization is terminated  or revoked sooner.       Influenza A by PCR NEGATIVE NEGATIVE Final   Influenza B by PCR NEGATIVE NEGATIVE Final    Comment: (NOTE) The Xpert Xpress SARS-CoV-2/FLU/RSV plus assay is intended as an aid in the diagnosis of influenza from Nasopharyngeal swab specimens and should not be used as a sole basis for treatment. Nasal washings and aspirates are unacceptable for Xpert Xpress SARS-CoV-2/FLU/RSV testing.  Fact Sheet for Patients: EntrepreneurPulse.com.au  Fact Sheet for Healthcare Providers: IncredibleEmployment.be  This test is not yet approved or cleared by the [place] FDA and has been authorized for detection and/or diagnosis of SARS-CoV-2 by FDA under an Emergency Use Authorization (EUA). This EUA will remain in effect (meaning this test can be used) for the duration of the COVID-19 declaration under Section 564(b)(1) of the Act, 21 U.S.C. section 360bbb-3(b)(1), unless the authorization is terminated or revoked.  Performed at [facility], [address] [place], [place] [address]   MRSA Next Gen by PCR, Nasal     Status: None   Collection Time: 04/25/21 10:42 PM   Specimen: Nasal Mucosa; Nasal Swab  Result Value Ref Range Status    MRSA by PCR Next Gen NOT DETECTED NOT DETECTED Final    Comment: (NOTE) The GeneXpert MRSA Assay (FDA approved for NASAL specimens only), is one component of a comprehensive MRSA colonization surveillance program. It is not intended to diagnose MRSA infection nor to guide or monitor treatment for MRSA infections. Test performance is not FDA approved in patients less than 14 years old. Performed at [facility], [address], [place], [place] [address]     Radiology Reports DG Chest [place] 1 View  Result Date: 04/25/2021 CLINICAL DATA:  Chest pain EXAM: PORTABLE CHEST 1 VIEW COMPARISON:  09/15/2020 FINDINGS: Cardiomegaly, vascular congestion. Mild hyperinflation of the lungs compatible with COPD. No confluent opacities or effusions. No overt edema. IMPRESSION: Cardiomegaly, vascular congestion. COPD. Electronically Signed   By: Rolm Baptise M.D.   On: 04/25/2021 15:58   ECHOCARDIOGRAM COMPLETE  Result Date: 04/26/2021    ECHOCARDIOGRAM REPORT   Patient Name:   STEPHANIEANN POPESCU Date of Exam: 04/26/2021 Medical Rec #:  [id number]          Height:       64.0 in Accession #:    [id number]         Weight:       159.4 lb Date of Birth:  [birth year]         BSA:          1.776 m Patient Age:    37 years           BP:           111/72 mmHg Patient Gender: F                  HR:           89 bpm. Exam Location:  [name] Procedure: 2D Echo, Cardiac Doppler and Color Doppler Indications:    Atrial Fibrillation  History:        Patient has no prior history of Echocardiogram examinations.                 CHF, COPD, Arrythmias:Atrial Fibrillation; Risk  Factors:Dyslipidemia. Tobacco Abuse.  Sonographer:    Wenda Low Referring Phys: [place]  1. Left ventricular ejection fraction, by estimation, is 55 to 60%. The left ventricle has normal function. The left ventricle has no regional wall motion abnormalities. There is mild left ventricular hypertrophy. Left  ventricular diastolic parameters are indeterminate.  2. Right ventricular systolic function is normal. The right ventricular size is normal. There is moderately elevated pulmonary artery systolic pressure. The estimated right ventricular systolic pressure is 42.3 mmHg.  3. Left atrial size was severely dilated.  4. Right atrial size was severely dilated.  5. The mitral valve is degenerative. Moderate mitral annular calcification.     Moderate mitral valve regurgitation. Eccentric posterior directed jet, may be underestimating due to eccentric jet.  6. Tricuspid valve regurgitation is mild to moderate.  7. The aortic valve is tricuspid. There is moderate calcification of the aortic valve. Aortic valve regurgitation is mild. Moderate aortic valve stenosis. Vmax 2.9 m/s, MG 25mmHg, AVA 1.2 cm^2, DI 0.46  8. The inferior vena cava is dilated in size with <50% respiratory variability, suggesting right atrial pressure of 15 mmHg.  9. Evidence of atrial level shunting detected by color flow Doppler. FINDINGS  Left Ventricle: Left ventricular ejection fraction, by estimation, is 55 to 60%. The left ventricle has normal function. The left ventricle has no regional wall motion abnormalities. The left ventricular internal cavity size was normal in size. There is  mild left ventricular hypertrophy. Left ventricular diastolic parameters are indeterminate. Right Ventricle: The right ventricular size is normal. No increase in right ventricular wall thickness. Right ventricular systolic function is normal. There is moderately elevated pulmonary artery systolic pressure. The tricuspid regurgitant velocity is 3.34 m/s, and with an assumed right atrial pressure of 15 mmHg, the estimated right ventricular systolic pressure is 53.6 mmHg. Left Atrium: Left atrial size was severely dilated. Right Atrium: Right atrial size was severely dilated. Pericardium: There is no evidence of pericardial effusion. Mitral Valve: The mitral valve is  degenerative in appearance. Moderate mitral annular calcification. Moderate mitral valve regurgitation. MV peak gradient, 12.5 mmHg. The mean mitral valve gradient is 3.0 mmHg. Tricuspid Valve: The tricuspid valve is normal in structure. Tricuspid valve regurgitation is mild to moderate. Aortic Valve: The aortic valve is tricuspid. There is moderate calcification of the aortic valve. Aortic valve regurgitation is mild. Moderate aortic stenosis is present. Aortic valve mean gradient measures 15.5 mmHg. Aortic valve peak gradient measures 28.9 mmHg. Aortic valve area, by VTI measures 1.62 cm. Pulmonic Valve: The pulmonic valve was not well visualized. Pulmonic valve regurgitation is trivial. Aorta: The aortic root and ascending aorta are structurally normal, with no evidence of dilitation. Venous: The inferior vena cava is dilated in size with less than 50% respiratory variability, suggesting right atrial pressure of 15 mmHg. IAS/Shunts: Evidence of atrial level shunting detected by color flow Doppler.  LEFT VENTRICLE PLAX 2D LVIDd:         5.00 cm   Diastology LVIDs:         3.40 cm   LV e' medial:    11.10 cm/s LV PW:         1.10 cm   LV E/e' medial:  15.3 LV IVS:        0.80 cm   LV e' lateral:   12.40 cm/s LVOT diam:     2.00 cm   LV E/e' lateral: 13.7 LV SV:  81 LV SV Index:   46 LVOT Area:     3.14 cm  RIGHT VENTRICLE RV Basal diam:  3.50 cm RV Mid diam:    3.00 cm RV S prime:     20.50 cm/s TAPSE (M-mode): 1.6 cm LEFT ATRIUM              Index        RIGHT ATRIUM           Index LA diam:        6.10 cm  3.43 cm/m   RA Area:     26.50 cm LA Vol (A2C):   168.0 ml 94.57 ml/m  RA Volume:   81.50 ml  45.88 ml/m LA Vol (A4C):   149.0 ml 83.87 ml/m LA Biplane Vol: 159.0 ml 89.50 ml/m  AORTIC VALVE                     PULMONIC VALVE AV Area (Vmax):    1.85 cm      PV Vmax:       0.92 m/s AV Area (Vmean):   1.55 cm      PV Peak grad:  3.4 mmHg AV Area (VTI):     1.62 cm AV Vmax:           268.75 cm/s  AV Vmean:          180.250 cm/s AV VTI:            0.499 m AV Peak Grad:      28.9 mmHg AV Mean Grad:      15.5 mmHg LVOT Vmax:         158.67 cm/s LVOT Vmean:        88.700 cm/s LVOT VTI:          0.257 m LVOT/AV VTI ratio: 0.52  AORTA Ao Asc diam: 2.90 cm MITRAL VALVE                TRICUSPID VALVE MV Area (PHT): 4.99 cm     TR Peak grad:   44.6 mmHg MV Area VTI:   2.37 cm     TR Vmax:        334.00 cm/s MV Peak grad:  12.5 mmHg MV Mean grad:  3.0 mmHg     SHUNTS MV Vmax:       1.77 m/s     Systemic VTI:  0.26 m MV Vmean:      63.7 cm/s    Systemic Diam: 2.00 cm MV Decel Time: 152 msec MV E velocity: 170.00 cm/s Oswaldo Milian MD Electronically signed by Oswaldo Milian MD Signature Date/Time: 04/26/2021/11:29:14 AM    Final      CBC Recent Labs  Lab 04/25/21 1529 04/26/21 0416  WBC 11.8* 7.2  HGB 9.0* 8.9*  HCT 29.3* 30.0*  PLT 252 206  MCV 96.7 96.8  MCH 29.7 28.7  MCHC 30.7 29.7*  RDW 16.7* 16.6*    Chemistries  Recent Labs  Lab 04/25/21 1529 04/25/21 1714 04/26/21 0416 04/26/21 0625 04/26/21 1034 04/26/21 1342 04/26/21 1454 04/26/21 1738  NA 133*  --  137  --   --   --  137  --   K 5.7*   < > 6.0* 5.7* 5.1 4.8 4.8 4.8  CL 99  --  101  --   --   --  101  --   CO2 26  --  30  --   --   --  27  --   GLUCOSE 118*  --  178*  --   --   --  154*  --   BUN 37*  --  42*  --   --   --  47*  --   CREATININE 1.96*  --  1.99*  --   --   --  2.13*  --   CALCIUM 8.4*  --  8.9  --   --   --  8.7*  --    < > = values in this interval not displayed.   ------------------------------------------------------------------------------------------------------------------ No results for input(s): CHOL, HDL, LDLCALC, TRIG, CHOLHDL, LDLDIRECT in the last 72 hours.  Lab Results  Component Value Date   HGBA1C 5.6% 03/12/2014   ------------------------------------------------------------------------------------------------------------------ No results for input(s): TSH, T4TOTAL,  T3FREE, THYROIDAB in the last 72 hours.  Invalid input(s): FREET3 ------------------------------------------------------------------------------------------------------------------ No results for input(s): VITAMINB12, FOLATE, FERRITIN, TIBC, IRON, RETICCTPCT in the last 72 hours.  Coagulation profile Recent Labs  Lab 04/25/21 1529 04/26/21 0416  INR 3.9* 3.7*    No results for input(s): DDIMER in the last 72 hours.  Cardiac Enzymes No results for input(s): CKMB, TROPONINI, MYOGLOBIN in the last 168 hours.  Invalid input(s): CK ------------------------------------------------------------------------------------------------------------------    Component Value Date/Time   BNP 414.0 (H) 04/25/2021 1714     Roxan Hockey M.D on 04/26/2021 at 7:08 PM  Go to [email or web address] - for contact info  [facility] - Office  [phone]

## 2021-04-26 NOTE — Progress Notes (Signed)
Hyperkalemia  RN called due to patient's K+ at 6.0 this morning, this was 5.7 on admission, Lokelma was given on admission.  EKG will be done. IV NovoLog 5 units with dextrose 50% was given, IV hydration was provided and patient was already on Lasix.  Continue to monitor potassium level

## 2021-04-27 DIAGNOSIS — I4891 Unspecified atrial fibrillation: Secondary | ICD-10-CM | POA: Diagnosis not present

## 2021-04-27 DIAGNOSIS — J441 Chronic obstructive pulmonary disease with (acute) exacerbation: Secondary | ICD-10-CM | POA: Diagnosis not present

## 2021-04-27 DIAGNOSIS — E875 Hyperkalemia: Secondary | ICD-10-CM | POA: Diagnosis not present

## 2021-04-27 DIAGNOSIS — J9620 Acute and chronic respiratory failure, unspecified whether with hypoxia or hypercapnia: Secondary | ICD-10-CM | POA: Diagnosis not present

## 2021-04-27 LAB — BASIC METABOLIC PANEL
Anion gap: 9 (ref 5–15)
BUN: 56 mg/dL — ABNORMAL HIGH (ref 8–23)
CO2: 29 mmol/L (ref 22–32)
Calcium: 8.4 mg/dL — ABNORMAL LOW (ref 8.9–10.3)
Chloride: 99 mmol/L (ref 98–111)
Creatinine, Ser: 2.17 mg/dL — ABNORMAL HIGH (ref 0.44–1.00)
GFR, Estimated: 23 mL/min — ABNORMAL LOW (ref >60)
Glucose, Bld: 200 mg/dL — ABNORMAL HIGH (ref 70–99)
Potassium: 4.4 mmol/L (ref 3.5–5.1)
Sodium: 137 mmol/L (ref 135–145)

## 2021-04-27 LAB — BLOOD GAS, VENOUS
Acid-Base Excess: 2.9 mmol/L — ABNORMAL HIGH (ref 0.0–2.0)
Bicarbonate: 25.9 mmol/L (ref 20.0–28.0)
FIO2: 36
O2 Saturation: 58.1 %
Patient temperature: 36.7
pCO2, Ven: 56.7 mmHg (ref 44.0–60.0)
pH, Ven: 7.321 (ref 7.250–7.430)
pO2, Ven: 37.3 mmHg (ref 32.0–45.0)

## 2021-04-27 LAB — PROTIME-INR
INR: 2.8 — ABNORMAL HIGH (ref 0.8–1.2)
Prothrombin Time: 29.1 seconds — ABNORMAL HIGH (ref 11.4–15.2)

## 2021-04-27 MED ORDER — POLYETHYLENE GLYCOL 3350 17 G PO PACK
17.0000 g | PACK | Freq: Two times a day (BID) | ORAL | Status: DC
  Administered 2021-04-27 – 2021-05-03 (×8): 17 g via ORAL
  Filled 2021-04-27 (×12): qty 1

## 2021-04-27 MED ORDER — BISACODYL 10 MG RE SUPP
10.0000 mg | Freq: Once | RECTAL | Status: AC
  Administered 2021-04-27: 10 mg via RECTAL
  Filled 2021-04-27: qty 1

## 2021-04-27 MED ORDER — WARFARIN SODIUM 5 MG PO TABS
5.0000 mg | ORAL_TABLET | Freq: Once | ORAL | Status: AC
  Administered 2021-04-27: 5 mg via ORAL
  Filled 2021-04-27: qty 1

## 2021-04-27 MED ORDER — LORAZEPAM 2 MG/ML IJ SOLN
0.5000 mg | Freq: Three times a day (TID) | INTRAMUSCULAR | Status: DC | PRN
  Administered 2021-04-27: 0.5 mg via INTRAVENOUS
  Filled 2021-04-27: qty 1

## 2021-04-27 MED ORDER — METOPROLOL TARTRATE 25 MG PO TABS
25.0000 mg | ORAL_TABLET | Freq: Two times a day (BID) | ORAL | Status: DC
  Administered 2021-04-27 – 2021-04-30 (×7): 25 mg via ORAL
  Filled 2021-04-27 (×7): qty 1

## 2021-04-27 NOTE — Progress Notes (Signed)
ANTICOAGULATION CONSULT NOTE -   Pharmacy Consult for Warfarin (home med) Indication: atrial fibrillation  Allergies  Allergen Reactions   Baclofen     Feels crazy   Niaspan [Niacin]    Flexeril [Cyclobenzaprine]     Feels crazy    Patient Measurements: Height: 5\' 4"  (162.6 cm) Weight: 72.3 kg (159 lb 6.3 oz) IBW/kg (Calculated) : 54.7  Vital Signs: BP: 138/111 (10/11 1000) Pulse Rate: 145 (10/11 1000)  Labs: Recent Labs    04/25/21 1529 04/25/21 1714 04/26/21 0416 04/26/21 1454 04/27/21 0421  HGB 9.0*  --  8.9*  --   --   HCT 29.3*  --  30.0*  --   --   PLT 252  --  206  --   --   LABPROT 38.4*  --  36.5*  --  29.1*  INR 3.9*  --  3.7*  --  2.8*  CREATININE 1.96*  --  1.99* 2.13* 2.17*  TROPONINIHS 12 12  --   --   --      Estimated Creatinine Clearance: 20.8 mL/min (A) (by C-G formula based on SCr of 2.17 mg/dL (H)).   Medical History: Past Medical History:  Diagnosis Date   Arthritis    COPD (chronic obstructive pulmonary disease) ([place])    spirometry 04/20/10 FEV1 1.38 (58%) with ration 70   Depression    Diastolic dysfunction    noted on echo w/some apparent volume issues in past but not severe    HLD (hyperlipidemia)    HOH (hard of hearing)    HTN (hypertension)    mild; pulmonary   Permanent atrial fibrillation (HCC)    Ruptured disk    Tobacco abuse    Uterine cancer (HCC)     Medications:  Medications Prior to Admission  Medication Sig Dispense Refill Last Dose   acetaminophen (TYLENOL) 325 MG tablet Take 650 mg by mouth every 6 (six) hours as needed for moderate pain.   Past Week   albuterol (PROVENTIL) (2.5 MG/3ML) 0.083% nebulizer solution INHALE CONTENTS OF 1 VIAL IN NEBULIZER EVERY 6 HOURS AS NEEDED FOR FOR WHEEZING OR SHORTNESS OF BREATH. (Patient taking differently: Take 2.5 mg by nebulization every 6 (six) hours as needed for shortness of breath.) 90 mL 5 Past Week   albuterol (VENTOLIN HFA) 108 (90 Base) MCG/ACT inhaler INHALE 1-2  PUFFS INTO THE LUNGS EVERY 4 TO 6 HOURS AS NEEDED FOR SHORTNESS OF BREATH  (NEEDS TO BE SEEN BEFORE NEXT REFILL) 8.5 g 3 Past Week   amLODipine (NORVASC) 5 MG tablet TAKE ONE TABLET BY MOUTH ONCE DAILY (MORNING) 30 tablet 5 Past Week   atorvastatin (LIPITOR) 80 MG tablet TAKE ONE TABLET BY MOUTH DAILY AT 6 PM 30 tablet 0 Past Week   azithromycin (ZITHROMAX) 250 MG tablet Take 500 mg once, then 250 mg for four days 6 tablet 0 Past Week   busPIRone (BUSPAR) 5 MG tablet Take 1 tablet (5 mg total) by mouth 3 (three) times daily as needed. 90 tablet 2 Past Week   diazepam (VALIUM) 5 MG tablet Take 5 mg by mouth every 8 (eight) hours as needed for anxiety.   unknown   diclofenac sodium (VOLTAREN) 1 % GEL Apply 2 g topically 4 (four) times daily. 350 g 3 unknown   diltiazem (CARDIZEM) 30 MG tablet TAKE ONE TABLET BY MOUTH EVERY MORNING AND IN THE EVENING (MORNING,EVENING) 60 tablet 0 Past Week   DULoxetine (CYMBALTA) 60 MG capsule TAKE TWO (2) CAPSULES BY MOUTH  DAILY (EVENING) (Patient taking differently: Take 60 mg by mouth See admin instructions. Take 120 mg by mouth every evening) 60 capsule 5 Past Week   esomeprazole (NEXIUM) 40 MG capsule TAKE ONE CAPSULE BY MOUTH ONCE DAILY (MORNING PER PT) 30 capsule 5 Past Week   fluticasone (FLONASE) 50 MCG/ACT nasal spray USE ONE SPRAY IN EACH NOSTRIL ONCE DAILY. SHAKE GENTLY BEFORE USING. (Patient taking differently: Place 1 spray into both nostrils daily. SHAKE GENTLY BEFORE USING.) 16 g 2 unknown   Fluticasone-Umeclidin-Vilant (TRELEGY ELLIPTA) 100-62.5-25 MCG/INH AEPB Inhale 1 puff into the lungs daily. 3 each 4 Past Week   guaiFENesin (MUCINEX) 600 MG 12 hr tablet Take 1 tablet (600 mg total) by mouth 2 (two) times daily as needed. 60 tablet 1 Past Week   Magnesium Hydroxide (DULCOLAX PO) Take 25 mg by mouth daily.   unknown   Multiple Vitamins-Minerals (CENTRUM SILVER PO) Take 1 tablet by mouth daily.   Past Week   NON FORMULARY Inhale 5 L into the lungs  daily. Oxygen 5 to 6 L every day   Past Week   nystatin cream (MYCOSTATIN) Apply 1 application topically 2 (two) times daily. 30 g 6 unknown   oxyCODONE-acetaminophen (PERCOCET) 5-325 MG per tablet Take 2 tablets by mouth every 8 (eight) hours as needed.   Past Week   potassium chloride (KLOR-CON) 10 MEQ tablet TAKE ONE TABLET BY MOUTH EVERY MORNING 30 tablet 0 Past Week   predniSONE (STERAPRED UNI-PAK 21 TAB) 10 MG (21) TBPK tablet Use as directed 21 tablet 0 Past Week   torsemide (DEMADEX) 20 MG tablet TAKE 1 AND 1/2 TABLETS TO 2 TABLETS BY MOUTH EVERY MORNING ,1& ,1/2 IN PACK EXTRA IN BOTTLE IF NEEDED 45 tablet 0 Past Week   vitamin B-12 (CYANOCOBALAMIN) 1000 MCG tablet TAKE ONE TABLET BY MOUTH DAILY WITH BREAKFAST (MORNING) (Patient taking differently: Take 1,000 mcg by mouth daily.) 30 tablet 5 Past Week   Vitamin D, Ergocalciferol, (DRISDOL) 1.25 MG (50000 UNIT) CAPS capsule Take 1 capsule (50,000 Units total) by mouth every 7 (seven) days. 12 capsule 3 Past Week   Spacer/Aero-Holding Chambers DEVI 1 Device by Does not apply route in the morning and at bedtime. 1 each 1    warfarin (COUMADIN) 5 MG tablet TAKE 1 OR 2 TABLETS BY MOUTH AS DIRECTED BY WARFARIN CLINIC (,1-SU,M,W,TH,F,SA/ ,1 &,1/2-TU) 180 tablet 0 04/24/2021 at 0800    Assessment: 80 y.o. female with a history of chronic respiratory failure on 4 to 5 L home oxygen, COPD with pulmonary hypertension, chronic diastolic heart failure, hypertension, permanent A. fib on Coumadin for anticoagulation, essential hypertension, depression/anxiety. INR SUPRAtherapeutic on admission.    INR 2.8 today, therapeutic. Last visit in chart has patient taking 5mg  daily and med rec has her taking 5mg  daily and 7.5mg  on Tuesday. Hgb 8.9 plt 206  Goal of Therapy:  INR 2-3 Monitor platelets by anticoagulation protocol: Yes   Plan:  Coumadin 5mg  x 1 today Monitor INR daily  Isac Sarna, BS Vena Austria, BCPS Clinical Pharmacist Pager  [phone] 04/27/2021,11:58 AM

## 2021-04-27 NOTE — Progress Notes (Addendum)
Patient Demographics:    Melinda Collins, is a 80 y.o. female, DOB - 13-Jun-1941, CNO:[id number]  Admit date - 04/25/2021   Admitting Physician No admitting provider for patient encounter.  Outpatient Primary MD for the patient is [name], FNP  LOS - 2   Chief Complaint  Patient presents with   Chest Pain    C/o chest pain for last 2-3 days.  History of SOB with Home O2 @5 -6 liters.  Treated on Friday with z-pack and steroids by PCP for URI.        Subjective:    Melinda Collins today has no fevers, no emesis,  No chest pain,    Patient's daughter-in-law at bedside, questions answered -Tachycardia persist  Assessment  & Plan :    Active Problems:   Pulmonary hypertension (HCC)   Atrial fibrillation with RVR (HCC)   Permanent atrial fibrillation (HCC)   Pulmonary emphysema (HCC)   GAD (generalized anxiety disorder)   Acute on chronic respiratory failure (HCC)   COPD with acute exacerbation (HCC)   Hyperkalemia  Brief Summary:- 80 y.o. female with a history of chronic respiratory failure on 4 to 5 L home oxygen, COPD with pulmonary hypertension, chronic diastolic heart failure, hypertension, permanent A. fib on Coumadin for anticoagulation, essential hypertension, depression/anxiety  A/p 1) chronic A. fib with RVR--tachycardia persist, continue IV Cardizem for rate control -Rate control remains very challenging despite IV Cardizem -Patient with severe COPD/advanced pulmonary disease making it tricky to use amiodarone -Also worried about possible bronchospasms with beta-blockers -We will try metoprolol 25 mg twice daily and watch respiratory status more closely-while on metoprolol -Echo with EF of 55 to 60%, severe biatrial enlargement, moderate aortic -Continue anticoagulation with Coumadin  2) chronic hypoxic respiratory failure/COPD--- continue supplemental oxygen at 4 to 5 L,  continue bronchodilators -Patient was transitioned from Solu-Medrol to prednisone  3)HFpEF--patient with chronic diastolic dysfunction CHF, echo as above #1 -Continue diuresis  4)Hyperkalemia--improved with Lokelma  5) CKD stage - IV--renal function is not far from baseline - renally adjust medications, avoid nephrotoxic agents / dehydration  / hypotension   Disposition/Need for in-Hospital Stay- patient unable to be discharged at this time due to --hyperkalemia with A. fib with RVR requiring continuous IV Cardizem  Status is: Inpatient  Remains inpatient appropriate because: To see disposition above  Disposition: The patient is from: Home              Anticipated d/c is to: Home              Anticipated d/c date is: 2 days              Patient currently is not medically stable to d/c. Barriers: Not Clinically Stable-   Code Status : \ -  Code Status: Full Code   Family Communication:    (patient is alert, awake and coherent)  Discussed with son at bedside  Consults  :  na  DVT Prophylaxis  :   - SCDs /coumadin   warfarin (COUMADIN) tablet 5 mg    Lab Results  Component Value Date   PLT 206 04/26/2021    Inpatient Medications  Scheduled Meds:  aspirin EC  81 mg Oral Daily   atorvastatin  80  mg Oral q1800   Chlorhexidine Gluconate Cloth  6 each Topical Q0600   DULoxetine  120 mg Oral q1800   fluticasone furoate-vilanterol  1 puff Inhalation Daily   And   umeclidinium bromide  1 puff Inhalation Daily   furosemide  60 mg Intravenous BID   guaiFENesin  600 mg Oral BID   ipratropium  0.5 mg Nebulization Q6H   metoprolol tartrate  25 mg Oral BID   pantoprazole  40 mg Oral Daily   predniSONE  40 mg Oral Q breakfast   warfarin  5 mg Oral ONCE-1600   Warfarin - Pharmacist Dosing Inpatient   Does not apply q1600   Continuous Infusions:  cefTRIAXone (ROCEPHIN)  IV Stopped (04/26/21 1706)   diltiazem (CARDIZEM) infusion 15 mg/hr (04/27/21 0338)   PRN  Meds:.busPIRone, levalbuterol, magnesium hydroxide, ondansetron **OR** ondansetron (ZOFRAN) IV, oxyCODONE-acetaminophen    Anti-infectives (From admission, onward)    Start     Dose/Rate Route Frequency Ordered Stop   04/26/21 1600  cefTRIAXone (ROCEPHIN) 1 g in sodium chloride 0.9 % 100 mL IVPB        1 g 200 mL/hr over 30 Minutes Intravenous Every 24 hours 04/25/21 2241 05/01/21 1559   04/25/21 1700  cefTRIAXone (ROCEPHIN) 1 g in sodium chloride 0.9 % 100 mL IVPB        1 g 200 mL/hr over 30 Minutes Intravenous  Once 04/25/21 1647 04/25/21 1836         Objective:   Vitals:   04/27/21 0700 04/27/21 0800 04/27/21 0900 04/27/21 1000  BP: 102/63  131/84 (!) 138/111  Pulse: (!) 34 70 86 (!) 145  Resp: 16 (!) 25 14 (!) 21  Temp:      TempSrc:      SpO2: 100% 100% 100% 100%  Weight:      Height:        Wt Readings from Last 3 Encounters:  04/25/21 72.3 kg  04/08/21 71.7 kg  12/28/20 71.9 kg     Intake/Output Summary (Last 24 hours) at 04/27/2021 1429 Last data filed at 04/27/2021 1000 Gross per 24 hour  Intake 1143.4 ml  Output --  Net 1143.4 ml   Physical Exam  Gen:- Awake Alert,  in no apparent distress  HEENT:- [place].AT, No sclera icterus Nose- [place] 4L/min Neck-Supple Neck,No JVD,.  Lungs-fair air movement, no significant wheezing  CV- S1, S2 normal, irregularly irregular and tachycardic  abd-  +ve B.Sounds, Abd Soft, No tenderness,    Extremity/Skin:- No  edema, pedal pulses present  Psych-affect is appropriate, oriented x3 Neuro-generalized weakness, no new focal deficits, no tremors   Data Review:   Micro Results Recent Results (from the past 240 hour(s))  Resp Panel by RT-PCR (Flu A&B, Covid) Nasopharyngeal Swab     Status: None   Collection Time: 04/25/21  7:56 PM   Specimen: Nasopharyngeal Swab; Nasopharyngeal(NP) swabs in vial transport medium  Result Value Ref Range Status   SARS Coronavirus 2 by RT PCR NEGATIVE NEGATIVE Final    Comment:  (NOTE) SARS-CoV-2 target nucleic acids are NOT DETECTED.  The SARS-CoV-2 RNA is generally detectable in upper respiratory specimens during the acute phase of infection. The lowest concentration of SARS-CoV-2 viral copies this assay can detect is 138 copies/mL. A negative result does not preclude SARS-Cov-2 infection and should not be used as the sole basis for treatment or other patient management decisions. A negative result may occur with  improper specimen collection/handling, submission of specimen other than nasopharyngeal swab,  presence of viral mutation(s) within the areas targeted by this assay, and inadequate number of viral copies(<138 copies/mL). A negative result must be combined with clinical observations, patient history, and epidemiological information. The expected result is Negative.  Fact Sheet for Patients:  EntrepreneurPulse.com.au  Fact Sheet for Healthcare Providers:  IncredibleEmployment.be  This test is no t yet approved or cleared by the [place] FDA and  has been authorized for detection and/or diagnosis of SARS-CoV-2 by FDA under an Emergency Use Authorization (EUA). This EUA will remain  in effect (meaning this test can be used) for the duration of the COVID-19 declaration under Section 564(b)(1) of the Act, 21 U.S.C.section 360bbb-3(b)(1), unless the authorization is terminated  or revoked sooner.       Influenza A by PCR NEGATIVE NEGATIVE Final   Influenza B by PCR NEGATIVE NEGATIVE Final    Comment: (NOTE) The Xpert Xpress SARS-CoV-2/FLU/RSV plus assay is intended as an aid in the diagnosis of influenza from Nasopharyngeal swab specimens and should not be used as a sole basis for treatment. Nasal washings and aspirates are unacceptable for Xpert Xpress SARS-CoV-2/FLU/RSV testing.  Fact Sheet for Patients: EntrepreneurPulse.com.au  Fact Sheet for Healthcare  Providers: IncredibleEmployment.be  This test is not yet approved or cleared by the [place] FDA and has been authorized for detection and/or diagnosis of SARS-CoV-2 by FDA under an Emergency Use Authorization (EUA). This EUA will remain in effect (meaning this test can be used) for the duration of the COVID-19 declaration under Section 564(b)(1) of the Act, 21 U.S.C. section 360bbb-3(b)(1), unless the authorization is terminated or revoked.  Performed at [facility], [address], [place], [place] [address]   MRSA Next Gen by PCR, Nasal     Status: None   Collection Time: 04/25/21 10:42 PM   Specimen: Nasal Mucosa; Nasal Swab  Result Value Ref Range Status   MRSA by PCR Next Gen NOT DETECTED NOT DETECTED Final    Comment: (NOTE) The GeneXpert MRSA Assay (FDA approved for NASAL specimens only), is one component of a comprehensive MRSA colonization surveillance program. It is not intended to diagnose MRSA infection nor to guide or monitor treatment for MRSA infections. Test performance is not FDA approved in patients less than 75 years old. Performed at [facility], [address], [place], [place] [address]     Radiology Reports DG Chest [place] 1 View  Result Date: 04/25/2021 CLINICAL DATA:  Chest pain EXAM: PORTABLE CHEST 1 VIEW COMPARISON:  09/15/2020 FINDINGS: Cardiomegaly, vascular congestion. Mild hyperinflation of the lungs compatible with COPD. No confluent opacities or effusions. No overt edema. IMPRESSION: Cardiomegaly, vascular congestion. COPD. Electronically Signed   By: Rolm Baptise M.D.   On: 04/25/2021 15:58   ECHOCARDIOGRAM COMPLETE  Result Date: 04/26/2021    ECHOCARDIOGRAM REPORT   Patient Name:   MOE BRIER Date of Exam: 04/26/2021 Medical Rec #:  [id number]          Height:       64.0 in Accession #:    [id number]         Weight:       159.4 lb Date of Birth:  1940-08-24         BSA:          1.776 m Patient Age:    54 years            BP:           111/72 mmHg Patient Gender: F  HR:           89 bpm. Exam Location:  [name] Procedure: 2D Echo, Cardiac Doppler and Color Doppler Indications:    Atrial Fibrillation  History:        Patient has no prior history of Echocardiogram examinations.                 CHF, COPD, Arrythmias:Atrial Fibrillation; Risk                 Factors:Dyslipidemia. Tobacco Abuse.  Sonographer:    Wenda Low Referring Phys: [place]  1. Left ventricular ejection fraction, by estimation, is 55 to 60%. The left ventricle has normal function. The left ventricle has no regional wall motion abnormalities. There is mild left ventricular hypertrophy. Left ventricular diastolic parameters are indeterminate.  2. Right ventricular systolic function is normal. The right ventricular size is normal. There is moderately elevated pulmonary artery systolic pressure. The estimated right ventricular systolic pressure is 91.6 mmHg.  3. Left atrial size was severely dilated.  4. Right atrial size was severely dilated.  5. The mitral valve is degenerative. Moderate mitral annular calcification.     Moderate mitral valve regurgitation. Eccentric posterior directed jet, may be underestimating due to eccentric jet.  6. Tricuspid valve regurgitation is mild to moderate.  7. The aortic valve is tricuspid. There is moderate calcification of the aortic valve. Aortic valve regurgitation is mild. Moderate aortic valve stenosis. Vmax 2.9 m/s, MG 80mmHg, AVA 1.2 cm^2, DI 0.46  8. The inferior vena cava is dilated in size with <50% respiratory variability, suggesting right atrial pressure of 15 mmHg.  9. Evidence of atrial level shunting detected by color flow Doppler. FINDINGS  Left Ventricle: Left ventricular ejection fraction, by estimation, is 55 to 60%. The left ventricle has normal function. The left ventricle has no regional wall motion abnormalities. The left ventricular internal cavity size was  normal in size. There is  mild left ventricular hypertrophy. Left ventricular diastolic parameters are indeterminate. Right Ventricle: The right ventricular size is normal. No increase in right ventricular wall thickness. Right ventricular systolic function is normal. There is moderately elevated pulmonary artery systolic pressure. The tricuspid regurgitant velocity is 3.34 m/s, and with an assumed right atrial pressure of 15 mmHg, the estimated right ventricular systolic pressure is 38.4 mmHg. Left Atrium: Left atrial size was severely dilated. Right Atrium: Right atrial size was severely dilated. Pericardium: There is no evidence of pericardial effusion. Mitral Valve: The mitral valve is degenerative in appearance. Moderate mitral annular calcification. Moderate mitral valve regurgitation. MV peak gradient, 12.5 mmHg. The mean mitral valve gradient is 3.0 mmHg. Tricuspid Valve: The tricuspid valve is normal in structure. Tricuspid valve regurgitation is mild to moderate. Aortic Valve: The aortic valve is tricuspid. There is moderate calcification of the aortic valve. Aortic valve regurgitation is mild. Moderate aortic stenosis is present. Aortic valve mean gradient measures 15.5 mmHg. Aortic valve peak gradient measures 28.9 mmHg. Aortic valve area, by VTI measures 1.62 cm. Pulmonic Valve: The pulmonic valve was not well visualized. Pulmonic valve regurgitation is trivial. Aorta: The aortic root and ascending aorta are structurally normal, with no evidence of dilitation. Venous: The inferior vena cava is dilated in size with less than 50% respiratory variability, suggesting right atrial pressure of 15 mmHg. IAS/Shunts: Evidence of atrial level shunting detected by color flow Doppler.  LEFT VENTRICLE PLAX 2D LVIDd:         5.00 cm  Diastology LVIDs:         3.40 cm   LV e' medial:    11.10 cm/s LV PW:         1.10 cm   LV E/e' medial:  15.3 LV IVS:        0.80 cm   LV e' lateral:   12.40 cm/s LVOT diam:     2.00  cm   LV E/e' lateral: 13.7 LV SV:         81 LV SV Index:   46 LVOT Area:     3.14 cm  RIGHT VENTRICLE RV Basal diam:  3.50 cm RV Mid diam:    3.00 cm RV S prime:     20.50 cm/s TAPSE (M-mode): 1.6 cm LEFT ATRIUM              Index        RIGHT ATRIUM           Index LA diam:        6.10 cm  3.43 cm/m   RA Area:     26.50 cm LA Vol (A2C):   168.0 ml 94.57 ml/m  RA Volume:   81.50 ml  45.88 ml/m LA Vol (A4C):   149.0 ml 83.87 ml/m LA Biplane Vol: 159.0 ml 89.50 ml/m  AORTIC VALVE                     PULMONIC VALVE AV Area (Vmax):    1.85 cm      PV Vmax:       0.92 m/s AV Area (Vmean):   1.55 cm      PV Peak grad:  3.4 mmHg AV Area (VTI):     1.62 cm AV Vmax:           268.75 cm/s AV Vmean:          180.250 cm/s AV VTI:            0.499 m AV Peak Grad:      28.9 mmHg AV Mean Grad:      15.5 mmHg LVOT Vmax:         158.67 cm/s LVOT Vmean:        88.700 cm/s LVOT VTI:          0.257 m LVOT/AV VTI ratio: 0.52  AORTA Ao Asc diam: 2.90 cm MITRAL VALVE                TRICUSPID VALVE MV Area (PHT): 4.99 cm     TR Peak grad:   44.6 mmHg MV Area VTI:   2.37 cm     TR Vmax:        334.00 cm/s MV Peak grad:  12.5 mmHg MV Mean grad:  3.0 mmHg     SHUNTS MV Vmax:       1.77 m/s     Systemic VTI:  0.26 m MV Vmean:      63.7 cm/s    Systemic Diam: 2.00 cm MV Decel Time: 152 msec MV E velocity: 170.00 cm/s Oswaldo Milian MD Electronically signed by Oswaldo Milian MD Signature Date/Time: 04/26/2021/11:29:14 AM    Final      CBC Recent Labs  Lab 04/25/21 1529 04/26/21 0416  WBC 11.8* 7.2  HGB 9.0* 8.9*  HCT 29.3* 30.0*  PLT 252 206  MCV 96.7 96.8  MCH 29.7 28.7  MCHC 30.7 29.7*  RDW 16.7* 16.6*    Chemistries  Recent Labs  Lab 04/25/21 1529 04/25/21 1714 04/26/21 0416 04/26/21 0625 04/26/21 1034 04/26/21 1342 04/26/21 1454 04/26/21 1738 04/27/21 0421  NA 133*  --  137  --   --   --  137  --  137  K 5.7*   < > 6.0*   < > 5.1 4.8 4.8 4.8 4.4  CL 99  --  101  --   --   --  101  --   99  CO2 26  --  30  --   --   --  27  --  29  GLUCOSE 118*  --  178*  --   --   --  154*  --  200*  BUN 37*  --  42*  --   --   --  47*  --  56*  CREATININE 1.96*  --  1.99*  --   --   --  2.13*  --  2.17*  CALCIUM 8.4*  --  8.9  --   --   --  8.7*  --  8.4*   < > = values in this interval not displayed.   ------------------------------------------------------------------------------------------------------------------ No results for input(s): CHOL, HDL, LDLCALC, TRIG, CHOLHDL, LDLDIRECT in the last 72 hours.  Lab Results  Component Value Date   HGBA1C 5.6% 03/12/2014   ------------------------------------------------------------------------------------------------------------------ No results for input(s): TSH, T4TOTAL, T3FREE, THYROIDAB in the last 72 hours.  Invalid input(s): FREET3 ------------------------------------------------------------------------------------------------------------------ No results for input(s): VITAMINB12, FOLATE, FERRITIN, TIBC, IRON, RETICCTPCT in the last 72 hours.  Coagulation profile Recent Labs  Lab 04/25/21 1529 04/26/21 0416 04/27/21 0421  INR 3.9* 3.7* 2.8*    No results for input(s): DDIMER in the last 72 hours.  Cardiac Enzymes No results for input(s): CKMB, TROPONINI, MYOGLOBIN in the last 168 hours.  Invalid input(s): CK ------------------------------------------------------------------------------------------------------------------    Component Value Date/Time   BNP 414.0 (H) 04/25/2021 1714   Roxan Hockey M.D on 04/27/2021 at 2:29 PM  Go to [email or web address] - for contact info  [facility] - Office  [phone]

## 2021-04-27 NOTE — Progress Notes (Signed)
**Note De-Identified  Obfuscation** ABG attempted x2; unable to collect due to patient confusion and jerking. Patient on 4L [place] SAT 100%. VBG ordered.

## 2021-04-27 NOTE — Plan of Care (Signed)

## 2021-04-28 ENCOUNTER — Inpatient Hospital Stay (HOSPITAL_COMMUNITY): Payer: Medicare Other

## 2021-04-28 DIAGNOSIS — J441 Chronic obstructive pulmonary disease with (acute) exacerbation: Secondary | ICD-10-CM | POA: Diagnosis not present

## 2021-04-28 DIAGNOSIS — I4821 Permanent atrial fibrillation: Secondary | ICD-10-CM | POA: Diagnosis not present

## 2021-04-28 DIAGNOSIS — I4891 Unspecified atrial fibrillation: Secondary | ICD-10-CM | POA: Diagnosis not present

## 2021-04-28 DIAGNOSIS — J9620 Acute and chronic respiratory failure, unspecified whether with hypoxia or hypercapnia: Secondary | ICD-10-CM | POA: Diagnosis not present

## 2021-04-28 LAB — BLOOD GAS, ARTERIAL
Acid-Base Excess: 3.4 mmol/L — ABNORMAL HIGH (ref 0.0–2.0)
Bicarbonate: 26.9 mmol/L (ref 20.0–28.0)
FIO2: 36
O2 Saturation: 89.3 %
Patient temperature: 36.4
pCO2 arterial: 50.4 mmHg — ABNORMAL HIGH (ref 32.0–48.0)
pH, Arterial: 7.366 (ref 7.350–7.450)
pO2, Arterial: 63.7 mmHg — ABNORMAL LOW (ref 83.0–108.0)

## 2021-04-28 LAB — RENAL FUNCTION PANEL
Albumin: 3.5 g/dL (ref 3.5–5.0)
Anion gap: 12 (ref 5–15)
BUN: 65 mg/dL — ABNORMAL HIGH (ref 8–23)
CO2: 26 mmol/L (ref 22–32)
Calcium: 8 mg/dL — ABNORMAL LOW (ref 8.9–10.3)
Chloride: 102 mmol/L (ref 98–111)
Creatinine, Ser: 2.1 mg/dL — ABNORMAL HIGH (ref 0.44–1.00)
GFR, Estimated: 24 mL/min — ABNORMAL LOW (ref >60)
Glucose, Bld: 110 mg/dL — ABNORMAL HIGH (ref 70–99)
Phosphorus: 6.4 mg/dL — ABNORMAL HIGH (ref 2.5–4.6)
Potassium: 4.6 mmol/L (ref 3.5–5.1)
Sodium: 140 mmol/L (ref 135–145)

## 2021-04-28 LAB — CBC
HCT: 28.8 % — ABNORMAL LOW (ref 36.0–46.0)
Hemoglobin: 8.6 g/dL — ABNORMAL LOW (ref 12.0–15.0)
MCH: 28.8 pg (ref 26.0–34.0)
MCHC: 29.9 g/dL — ABNORMAL LOW (ref 30.0–36.0)
MCV: 96.3 fL (ref 80.0–100.0)
Platelets: 243 10*3/uL (ref 150–400)
RBC: 2.99 MIL/uL — ABNORMAL LOW (ref 3.87–5.11)
RDW: 16.5 % — ABNORMAL HIGH (ref 11.5–15.5)
WBC: 13.1 10*3/uL — ABNORMAL HIGH (ref 4.0–10.5)
nRBC: 0.3 % — ABNORMAL HIGH (ref 0.0–0.2)

## 2021-04-28 LAB — PROTIME-INR
INR: 2.4 — ABNORMAL HIGH (ref 0.8–1.2)
Prothrombin Time: 26.3 seconds — ABNORMAL HIGH (ref 11.4–15.2)

## 2021-04-28 MED ORDER — DOXYCYCLINE HYCLATE 100 MG PO TABS
100.0000 mg | ORAL_TABLET | Freq: Two times a day (BID) | ORAL | Status: DC
  Administered 2021-04-28 – 2021-05-01 (×7): 100 mg via ORAL
  Filled 2021-04-28 (×7): qty 1

## 2021-04-28 MED ORDER — MORPHINE SULFATE (PF) 2 MG/ML IV SOLN
1.0000 mg | INTRAVENOUS | Status: DC | PRN
  Filled 2021-04-28: qty 1

## 2021-04-28 MED ORDER — HALOPERIDOL LACTATE 5 MG/ML IJ SOLN
5.0000 mg | Freq: Two times a day (BID) | INTRAMUSCULAR | Status: DC | PRN
  Administered 2021-04-28: 5 mg via INTRAMUSCULAR
  Filled 2021-04-28: qty 1

## 2021-04-28 MED ORDER — HALOPERIDOL LACTATE 5 MG/ML IJ SOLN
10.0000 mg | Freq: Once | INTRAMUSCULAR | Status: AC
  Administered 2021-04-28: 10 mg via INTRAMUSCULAR
  Filled 2021-04-28: qty 2

## 2021-04-28 MED ORDER — WARFARIN SODIUM 5 MG PO TABS
5.0000 mg | ORAL_TABLET | Freq: Once | ORAL | Status: AC
  Administered 2021-04-28: 5 mg via ORAL
  Filled 2021-04-28: qty 1

## 2021-04-28 MED ORDER — MORPHINE SULFATE (PF) 2 MG/ML IV SOLN
2.0000 mg | INTRAVENOUS | Status: DC | PRN
  Administered 2021-04-28 – 2021-04-29 (×2): 2 mg via INTRAVENOUS
  Filled 2021-04-28 (×2): qty 1

## 2021-04-28 MED ORDER — MORPHINE SULFATE (PF) 2 MG/ML IV SOLN
1.0000 mg | Freq: Once | INTRAVENOUS | Status: AC
  Administered 2021-04-28: 1 mg via INTRAVENOUS

## 2021-04-28 MED ORDER — MORPHINE SULFATE (PF) 2 MG/ML IV SOLN
1.0000 mg | INTRAVENOUS | Status: DC | PRN
Start: 2021-04-28 — End: 2021-04-28
  Administered 2021-04-28: 1 mg via INTRAVENOUS
  Filled 2021-04-28: qty 1

## 2021-04-28 MED ORDER — TAMSULOSIN HCL 0.4 MG PO CAPS
0.4000 mg | ORAL_CAPSULE | Freq: Every day | ORAL | Status: DC
  Administered 2021-04-30 – 2021-05-03 (×4): 0.4 mg via ORAL
  Filled 2021-04-28 (×6): qty 1

## 2021-04-28 NOTE — Progress Notes (Signed)
RN called due to patient developing increased work of breathing, she was agitated and pulling off everything, Ativan was already given without any improvement and intermittent breathing treatments did not alleviate patient's increased work of breathing and anxiety.  Patient will be placed on BiPAP, IV morphine starting with 1 mg and titrating as tolerated will be tried.  ABG will be checked about 2 hours after being on BiPAP.

## 2021-04-28 NOTE — Progress Notes (Signed)
Went in to give patient breathing treatment and draw MD ordered ABG.  Patient had became agitated again and had pulled Bipap mask off.  Patient was placed back on 4L [place].  RN was able to give more morphine and patient was placed back on Bipap, this allowed me to gather ABG, but since patient had been on [place], had to draw ABG and list [place] instead of Bipap.  Awaiting ABG results.  Breathing treatment was given via [employer] on Bipap after Morphine given.

## 2021-04-28 NOTE — Progress Notes (Signed)
Patient Demographics:    Melinda Collins, is a 80 y.o. female, DOB - May 24, 1941, DJT:[id number]  Admit date - 04/25/2021   Admitting Physician No admitting provider for patient encounter.  Outpatient Primary MD for the patient is [name], FNP  LOS - 3   Chief Complaint  Patient presents with   Chest Pain    C/o chest pain for last 2-3 days.  History of SOB with Home O2 @5 -6 liters.  Treated on Friday with z-pack and steroids by PCP for URI.        Subjective:    Melinda Collins today has no fevers, no emesis,  No chest pain,    Patient's daughter-in-law at bedside, questions answered --- Patient has significant agitation and confusion overnight -There was also concern about urinary retention but she is voiding better now -Required BiPAP, Ativan and morphine sulfate overnight for agitation  Assessment  & Plan :    Active Problems:   Pulmonary hypertension (HCC)   Atrial fibrillation with RVR (HCC)   Permanent atrial fibrillation (HCC)   Pulmonary emphysema (HCC)   GAD (generalized anxiety disorder)   Acute on chronic respiratory failure (HCC)   COPD with acute exacerbation (HCC)   Hyperkalemia  Brief Summary:- 80 y.o. female with a history of chronic respiratory failure on 4 to 5 L home oxygen, COPD with pulmonary hypertension, chronic diastolic heart failure, hypertension, permanent A. fib on Coumadin for anticoagulation, essential hypertension, depression/anxiety  A/p 1) chronic A. fib with RVR--tachycardia persist, continue IV Cardizem for rate control -Rate control improving with IV Cardizem and metoprolol continue to wean off Cardizem as able -Patient with severe COPD/advanced pulmonary disease making it tricky to use amiodarone -Also worried about possible bronchospasms with beta-blockers -Continue metoprolol 25 mg twice daily and watch respiratory status more closely-while  on metoprolol -Echo with EF of 55 to 60%, severe biatrial enlargement, moderate aortic -Continue anticoagulation with Coumadin  2) chronic hypoxic respiratory failure/COPD--- continue supplemental oxygen at 4 to 5 L, continue bronchodilators -Patient was transitioned from Solu-Medrol to prednisone -Stop prednisone On 04/28/2021 due to concerns about agitation and possible steroid-induced psychosis  3)HFpEF--patient with chronic diastolic dysfunction CHF, echo as above #1 -Continue diuresis with IV Lasix  4)Hyperkalemia--improved with Lokelma  5) CKD stage - IV--renal function is not far from baseline - renally adjust medications, avoid nephrotoxic agents/dehydration /hypotension  6) acute metabolic encephalopathy/agitation--- 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 fib with RVR requiring continuous IV Cardizem  Status is: Inpatient  Remains inpatient appropriate because: To see disposition above  Disposition: The patient is from: Home              Anticipated d/c is to: Home              Anticipated d/c date is: 2 days              Patient currently is not medically stable to d/c. Barriers: Not Clinically Stable-   Code Status : \ -  Code Status: Full Code   Family Communication:    (patient  is alert, awake and coherent)  Discussed with son and daughter in law at bedside  Consults  :  na  DVT Prophylaxis  :   - SCDs /coumadin      Lab Results  Component Value Date   PLT 243 04/28/2021    Inpatient Medications  Scheduled Meds:  aspirin EC  81 mg Oral Daily   atorvastatin  80 mg Oral q1800   Chlorhexidine Gluconate Cloth  6 each Topical Q0600   doxycycline  100 mg Oral Q12H   DULoxetine  120 mg Oral q1800   fluticasone furoate-vilanterol  1 puff  Inhalation Daily   And   umeclidinium bromide  1 puff Inhalation Daily   furosemide  60 mg Intravenous BID   guaiFENesin  600 mg Oral BID   ipratropium  0.5 mg Nebulization Q6H   metoprolol tartrate  25 mg Oral BID   pantoprazole  40 mg Oral Daily   polyethylene glycol  17 g Oral BID   tamsulosin  0.4 mg Oral Daily   Warfarin - Pharmacist Dosing Inpatient   Does not apply q1600   Continuous Infusions:  cefTRIAXone (ROCEPHIN)  IV Stopped (04/28/21 1616)   diltiazem (CARDIZEM) infusion 7.5 mg/hr (04/28/21 1700)   PRN Meds:.busPIRone, haloperidol lactate, levalbuterol, LORazepam, magnesium hydroxide, morphine injection, ondansetron **OR** ondansetron (ZOFRAN) IV, oxyCODONE-acetaminophen    Anti-infectives (From admission, onward)    Start     Dose/Rate Route Frequency Ordered Stop   04/28/21 1515  doxycycline (VIBRA-TABS) tablet 100 mg        100 mg Oral Every 12 hours 04/28/21 1421 05/05/21 0959   04/26/21 1600  cefTRIAXone (ROCEPHIN) 1 g in sodium chloride 0.9 % 100 mL IVPB        1 g 200 mL/hr over 30 Minutes Intravenous Every 24 hours 04/25/21 2241 05/01/21 1559   04/25/21 1700  cefTRIAXone (ROCEPHIN) 1 g in sodium chloride 0.9 % 100 mL IVPB        1 g 200 mL/hr over 30 Minutes Intravenous  Once 04/25/21 1647 04/25/21 1836         Objective:   Vitals:   04/28/21 1500 04/28/21 1600 04/28/21 1618 04/28/21 1700  BP: 119/80 (!) 103/48  117/65  Pulse: 86 (!) 45 (!) 51 (!) 58  Resp: 19 13 17  (!) 25  Temp:   97.6 F (36.4 C)   TempSrc:   Axillary   SpO2: 100% 100% 97% 99%  Weight:      Height:        Wt Readings from Last 3 Encounters:  04/25/21 72.3 kg  04/08/21 71.7 kg  12/28/20 71.9 kg     Intake/Output Summary (Last 24 hours) at 04/28/2021 1830 Last data filed at 04/28/2021 1700 Gross per 24 hour  Intake 522.78 ml  Output 550 ml  Net -27.22 ml   Physical Exam  Gen:- Awake Alert,  in no apparent distress  HEENT:- [place].AT, No sclera icterus Nose- [place]  4L/min Neck-Supple Neck,No JVD,.  Lungs-fair air movement, no significant wheezing  CV- S1, S2 normal, irregularly irregular and tachycardic  abd-  +ve B.Sounds, Abd Soft, No tenderness,    Extremity/Skin:- No  edema, pedal pulses present  Psych-episodes of agitation and disorientation Neuro-generalized weakness, no new focal deficits, no tremors   Data Review:   Micro Results Recent Results (from the past 240 hour(s))  Resp Panel by RT-PCR (Flu A&B, Covid) Nasopharyngeal Swab     Status: None   Collection Time: 04/25/21  7:56 PM  Specimen: Nasopharyngeal Swab; Nasopharyngeal(NP) swabs in vial transport medium  Result Value Ref Range Status   SARS Coronavirus 2 by RT PCR NEGATIVE NEGATIVE Final    Comment: (NOTE) SARS-CoV-2 target nucleic acids are NOT DETECTED.  The SARS-CoV-2 RNA is generally detectable in upper respiratory specimens during the acute phase of infection. The lowest concentration of SARS-CoV-2 viral copies this assay can detect is 138 copies/mL. A negative result does not preclude SARS-Cov-2 infection and should not be used as the sole basis for treatment or other patient management decisions. A negative result may occur with  improper specimen collection/handling, submission of specimen other than nasopharyngeal swab, presence of viral mutation(s) within the areas targeted by this assay, and inadequate number of viral copies(<138 copies/mL). A negative result must be combined with clinical observations, patient history, and epidemiological information. The expected result is Negative.  Fact Sheet for Patients:  EntrepreneurPulse.com.au  Fact Sheet for Healthcare Providers:  IncredibleEmployment.be  This test is no t yet approved or cleared by the [place] FDA and  has been authorized for detection and/or diagnosis of SARS-CoV-2 by FDA under an Emergency Use Authorization (EUA). This EUA will remain  in effect  (meaning this test can be used) for the duration of the COVID-19 declaration under Section 564(b)(1) of the Act, 21 U.S.C.section 360bbb-3(b)(1), unless the authorization is terminated  or revoked sooner.       Influenza A by PCR NEGATIVE NEGATIVE Final   Influenza B by PCR NEGATIVE NEGATIVE Final    Comment: (NOTE) The Xpert Xpress SARS-CoV-2/FLU/RSV plus assay is intended as an aid in the diagnosis of influenza from Nasopharyngeal swab specimens and should not be used as a sole basis for treatment. Nasal washings and aspirates are unacceptable for Xpert Xpress SARS-CoV-2/FLU/RSV testing.  Fact Sheet for Patients: EntrepreneurPulse.com.au  Fact Sheet for Healthcare Providers: IncredibleEmployment.be  This test is not yet approved or cleared by the [place] FDA and has been authorized for detection and/or diagnosis of SARS-CoV-2 by FDA under an Emergency Use Authorization (EUA). This EUA will remain in effect (meaning this test can be used) for the duration of the COVID-19 declaration under Section 564(b)(1) of the Act, 21 U.S.C. section 360bbb-3(b)(1), unless the authorization is terminated or revoked.  Performed at [facility], [address] [place], [place] [address]   MRSA Next Gen by PCR, Nasal     Status: None   Collection Time: 04/25/21 10:42 PM   Specimen: Nasal Mucosa; Nasal Swab  Result Value Ref Range Status   MRSA by PCR Next Gen NOT DETECTED NOT DETECTED Final    Comment: (NOTE) The GeneXpert MRSA Assay (FDA approved for NASAL specimens only), is one component of a comprehensive MRSA colonization surveillance program. It is not intended to diagnose MRSA infection nor to guide or monitor treatment for MRSA infections. Test performance is not FDA approved in patients less than 16 years old. Performed at [facility], [address], [place], [place] [address]     Radiology Reports DG CHEST PORT 1 VIEW  Result Date:  04/28/2021 CLINICAL DATA:  Short of breath and cough EXAM: PORTABLE CHEST 1 VIEW COMPARISON:  04/25/2021 FINDINGS: Cardiac enlargement with mild vascular congestion. Negative for edema or effusion. Mild bibasilar airspace disease unchanged. IMPRESSION: No interval change from the prior study. Vascular congestion without edema. Mild bibasilar atelectasis/infiltrate. Electronically Signed   By: Franchot Gallo M.D.   On: 04/28/2021 12:28   DG Chest Port 1 View  Result Date: 04/25/2021 CLINICAL DATA:  Chest pain EXAM: PORTABLE CHEST 1 VIEW COMPARISON:  09/15/2020 FINDINGS: Cardiomegaly, vascular congestion. Mild hyperinflation of the lungs compatible with COPD. No confluent opacities or effusions. No overt edema. IMPRESSION: Cardiomegaly, vascular congestion. COPD. Electronically Signed   By: Rolm Baptise M.D.   On: 04/25/2021 15:58   ECHOCARDIOGRAM COMPLETE  Result Date: 04/26/2021    ECHOCARDIOGRAM REPORT   Patient Name:   Melinda Collins Date of Exam: 04/26/2021 Medical Rec #:  [id number]          Height:       64.0 in Accession #:    [id number]         Weight:       159.4 lb Date of Birth:  1940-08-17         BSA:          1.776 m Patient Age:    38 years           BP:           111/72 mmHg Patient Gender: F                  HR:           89 bpm. Exam Location:  [name] Procedure: 2D Echo, Cardiac Doppler and Color Doppler Indications:    Atrial Fibrillation  History:        Patient has no prior history of Echocardiogram examinations.                 CHF, COPD, Arrythmias:Atrial Fibrillation; Risk                 Factors:Dyslipidemia. Tobacco Abuse.  Sonographer:    Wenda Low Referring Phys: [place]  1. Left ventricular ejection fraction, by estimation, is 55 to 60%. The left ventricle has normal function. The left ventricle has no regional wall motion abnormalities. There is mild left ventricular hypertrophy. Left ventricular diastolic parameters are indeterminate.   2. Right ventricular systolic function is normal. The right ventricular size is normal. There is moderately elevated pulmonary artery systolic pressure. The estimated right ventricular systolic pressure is 68.0 mmHg.  3. Left atrial size was severely dilated.  4. Right atrial size was severely dilated.  5. The mitral valve is degenerative. Moderate mitral annular calcification.     Moderate mitral valve regurgitation. Eccentric posterior directed jet, may be underestimating due to eccentric jet.  6. Tricuspid valve regurgitation is mild to moderate.  7. The aortic valve is tricuspid. There is moderate calcification of the aortic valve. Aortic valve regurgitation is mild. Moderate aortic valve stenosis. Vmax 2.9 m/s, MG 2mmHg, AVA 1.2 cm^2, DI 0.46  8. The inferior vena cava is dilated in size with <50% respiratory variability, suggesting right atrial pressure of 15 mmHg.  9. Evidence of atrial level shunting detected by color flow Doppler. FINDINGS  Left Ventricle: Left ventricular ejection fraction, by estimation, is 55 to 60%. The left ventricle has normal function. The left ventricle has no regional wall motion abnormalities. The left ventricular internal cavity size was normal in size. There is  mild left ventricular hypertrophy. Left ventricular diastolic parameters are indeterminate. Right Ventricle: The right ventricular size is normal. No increase in right ventricular wall thickness. Right ventricular systolic function is normal. There is moderately elevated pulmonary artery systolic pressure. The tricuspid regurgitant velocity is 3.34 m/s, and with an assumed right atrial pressure of 15 mmHg, the estimated right ventricular systolic pressure is 32.1 mmHg. Left  Atrium: Left atrial size was severely dilated. Right Atrium: Right atrial size was severely dilated. Pericardium: There is no evidence of pericardial effusion. Mitral Valve: The mitral valve is degenerative in appearance. Moderate mitral annular  calcification. Moderate mitral valve regurgitation. MV peak gradient, 12.5 mmHg. The mean mitral valve gradient is 3.0 mmHg. Tricuspid Valve: The tricuspid valve is normal in structure. Tricuspid valve regurgitation is mild to moderate. Aortic Valve: The aortic valve is tricuspid. There is moderate calcification of the aortic valve. Aortic valve regurgitation is mild. Moderate aortic stenosis is present. Aortic valve mean gradient measures 15.5 mmHg. Aortic valve peak gradient measures 28.9 mmHg. Aortic valve area, by VTI measures 1.62 cm. Pulmonic Valve: The pulmonic valve was not well visualized. Pulmonic valve regurgitation is trivial. Aorta: The aortic root and ascending aorta are structurally normal, with no evidence of dilitation. Venous: The inferior vena cava is dilated in size with less than 50% respiratory variability, suggesting right atrial pressure of 15 mmHg. IAS/Shunts: Evidence of atrial level shunting detected by color flow Doppler.  LEFT VENTRICLE PLAX 2D LVIDd:         5.00 cm   Diastology LVIDs:         3.40 cm   LV e' medial:    11.10 cm/s LV PW:         1.10 cm   LV E/e' medial:  15.3 LV IVS:        0.80 cm   LV e' lateral:   12.40 cm/s LVOT diam:     2.00 cm   LV E/e' lateral: 13.7 LV SV:         81 LV SV Index:   46 LVOT Area:     3.14 cm  RIGHT VENTRICLE RV Basal diam:  3.50 cm RV Mid diam:    3.00 cm RV S prime:     20.50 cm/s TAPSE (M-mode): 1.6 cm LEFT ATRIUM              Index        RIGHT ATRIUM           Index LA diam:        6.10 cm  3.43 cm/m   RA Area:     26.50 cm LA Vol (A2C):   168.0 ml 94.57 ml/m  RA Volume:   81.50 ml  45.88 ml/m LA Vol (A4C):   149.0 ml 83.87 ml/m LA Biplane Vol: 159.0 ml 89.50 ml/m  AORTIC VALVE                     PULMONIC VALVE AV Area (Vmax):    1.85 cm      PV Vmax:       0.92 m/s AV Area (Vmean):   1.55 cm      PV Peak grad:  3.4 mmHg AV Area (VTI):     1.62 cm AV Vmax:           268.75 cm/s AV Vmean:          180.250 cm/s AV VTI:             0.499 m AV Peak Grad:      28.9 mmHg AV Mean Grad:      15.5 mmHg LVOT Vmax:         158.67 cm/s LVOT Vmean:        88.700 cm/s LVOT VTI:          0.257 m LVOT/AV VTI ratio: 0.52  AORTA Ao Asc diam: 2.90 cm MITRAL VALVE                TRICUSPID VALVE MV Area (PHT): 4.99 cm     TR Peak grad:   44.6 mmHg MV Area VTI:   2.37 cm     TR Vmax:        334.00 cm/s MV Peak grad:  12.5 mmHg MV Mean grad:  3.0 mmHg     SHUNTS MV Vmax:       1.77 m/s     Systemic VTI:  0.26 m MV Vmean:      63.7 cm/s    Systemic Diam: 2.00 cm MV Decel Time: 152 msec MV E velocity: 170.00 cm/s Oswaldo Milian MD Electronically signed by Oswaldo Milian MD Signature Date/Time: 04/26/2021/11:29:14 AM    Final      CBC Recent Labs  Lab 04/25/21 1529 04/26/21 0416 04/28/21 0418  WBC 11.8* 7.2 13.1*  HGB 9.0* 8.9* 8.6*  HCT 29.3* 30.0* 28.8*  PLT 252 206 243  MCV 96.7 96.8 96.3  MCH 29.7 28.7 28.8  MCHC 30.7 29.7* 29.9*  RDW 16.7* 16.6* 16.5*    Chemistries  Recent Labs  Lab 04/25/21 1529 04/25/21 1714 04/26/21 0416 04/26/21 0625 04/26/21 1342 04/26/21 1454 04/26/21 1738 04/27/21 0421 04/28/21 0418  NA 133*  --  137  --   --  137  --  137 140  K 5.7*   < > 6.0*   < > 4.8 4.8 4.8 4.4 4.6  CL 99  --  101  --   --  101  --  99 102  CO2 26  --  30  --   --  27  --  29 26  GLUCOSE 118*  --  178*  --   --  154*  --  200* 110*  BUN 37*  --  42*  --   --  47*  --  56* 65*  CREATININE 1.96*  --  1.99*  --   --  2.13*  --  2.17* 2.10*  CALCIUM 8.4*  --  8.9  --   --  8.7*  --  8.4* 8.0*   < > = values in this interval not displayed.   ------------------------------------------------------------------------------------------------------------------ No results for input(s): CHOL, HDL, LDLCALC, TRIG, CHOLHDL, LDLDIRECT in the last 72 hours.  Lab Results  Component Value Date   HGBA1C 5.6% 03/12/2014    ------------------------------------------------------------------------------------------------------------------ No results for input(s): TSH, T4TOTAL, T3FREE, THYROIDAB in the last 72 hours.  Invalid input(s): FREET3 ------------------------------------------------------------------------------------------------------------------ No results for input(s): VITAMINB12, FOLATE, FERRITIN, TIBC, IRON, RETICCTPCT in the last 72 hours.  Coagulation profile Recent Labs  Lab 04/25/21 1529 04/26/21 0416 04/27/21 0421 04/28/21 0418  INR 3.9* 3.7* 2.8* 2.4*    No results for input(s): DDIMER in the last 72 hours.  Cardiac Enzymes No results for input(s): CKMB, TROPONINI, MYOGLOBIN in the last 168 hours.  Invalid input(s): CK ------------------------------------------------------------------------------------------------------------------    Component Value Date/Time   BNP 414.0 (H) 04/25/2021 1714   Roxan Hockey M.D on 04/28/2021 at 6:30 PM  Go to [email or web address] - for contact info  [facility] - Office  [phone]

## 2021-04-28 NOTE — Progress Notes (Signed)
RN called earlier and related to this RT that patient was agitated and wheezing and needed another treatment.  Came and gave Xopenex treatment but patient continued to act SOB despite O2 sat at 97% and severely agitated and trying to climb OOB.   RN got order for morphine and Bipap order.  This RT went to get Bipap and placed patient on it shortly after morphine given.  Patient appears more relaxed although she is still grabbing for lines occasionally, but does not appear to be struggling to breathe as before.  Will get blood gas on patient in 2 hours and will continue to monitor.

## 2021-04-28 NOTE — Progress Notes (Signed)
ANTICOAGULATION CONSULT NOTE -   Pharmacy Consult for Warfarin (home med) Indication: atrial fibrillation  Allergies  Allergen Reactions   Baclofen     Feels crazy   Niaspan [Niacin]    Flexeril [Cyclobenzaprine]     Feels crazy    Patient Measurements: Height: 5\' 4"  (162.6 cm) Weight: 72.3 kg (159 lb 6.3 oz) IBW/kg (Calculated) : 54.7  Vital Signs: Temp: 97.7 F (36.5 C) (10/12 1139) Temp Source: Axillary (10/12 1139) BP: 132/78 (10/12 0600) Pulse Rate: 77 (10/12 1139)  Labs: Recent Labs    04/25/21 1529 04/25/21 1714 04/26/21 0416 04/26/21 1454 04/27/21 0421 04/28/21 0418  HGB 9.0*  --  8.9*  --   --  8.6*  HCT 29.3*  --  30.0*  --   --  28.8*  PLT 252  --  206  --   --  243  LABPROT 38.4*  --  36.5*  --  29.1* 26.3*  INR 3.9*  --  3.7*  --  2.8* 2.4*  CREATININE 1.96*  --  1.99* 2.13* 2.17* 2.10*  TROPONINIHS 12 12  --   --   --   --      Estimated Creatinine Clearance: 21.5 mL/min (A) (by C-G formula based on SCr of 2.1 mg/dL (H)).   Medical History: Past Medical History:  Diagnosis Date   Arthritis    COPD (chronic obstructive pulmonary disease) ([place])    spirometry 04/20/10 FEV1 1.38 (58%) with ration 70   Depression    Diastolic dysfunction    noted on echo w/some apparent volume issues in past but not severe    HLD (hyperlipidemia)    HOH (hard of hearing)    HTN (hypertension)    mild; pulmonary   Permanent atrial fibrillation (HCC)    Ruptured disk    Tobacco abuse    Uterine cancer (HCC)     Medications:  Medications Prior to Admission  Medication Sig Dispense Refill Last Dose   acetaminophen (TYLENOL) 325 MG tablet Take 650 mg by mouth every 6 (six) hours as needed for moderate pain.   Past Week   albuterol (PROVENTIL) (2.5 MG/3ML) 0.083% nebulizer solution INHALE CONTENTS OF 1 VIAL IN NEBULIZER EVERY 6 HOURS AS NEEDED FOR FOR WHEEZING OR SHORTNESS OF BREATH. (Patient taking differently: Take 2.5 mg by nebulization every 6 (six) hours  as needed for shortness of breath.) 90 mL 5 Past Week   albuterol (VENTOLIN HFA) 108 (90 Base) MCG/ACT inhaler INHALE 1-2 PUFFS INTO THE LUNGS EVERY 4 TO 6 HOURS AS NEEDED FOR SHORTNESS OF BREATH  (NEEDS TO BE SEEN BEFORE NEXT REFILL) 8.5 g 3 Past Week   amLODipine (NORVASC) 5 MG tablet TAKE ONE TABLET BY MOUTH ONCE DAILY (MORNING) 30 tablet 5 Past Week   atorvastatin (LIPITOR) 80 MG tablet TAKE ONE TABLET BY MOUTH DAILY AT 6 PM 30 tablet 0 Past Week   azithromycin (ZITHROMAX) 250 MG tablet Take 500 mg once, then 250 mg for four days 6 tablet 0 Past Week   busPIRone (BUSPAR) 5 MG tablet Take 1 tablet (5 mg total) by mouth 3 (three) times daily as needed. 90 tablet 2 Past Week   diazepam (VALIUM) 5 MG tablet Take 5 mg by mouth every 8 (eight) hours as needed for anxiety.   unknown   diclofenac sodium (VOLTAREN) 1 % GEL Apply 2 g topically 4 (four) times daily. 350 g 3 unknown   diltiazem (CARDIZEM) 30 MG tablet TAKE ONE TABLET BY MOUTH EVERY MORNING  AND IN THE EVENING (MORNING,EVENING) 60 tablet 0 Past Week   DULoxetine (CYMBALTA) 60 MG capsule TAKE TWO (2) CAPSULES BY MOUTH DAILY (EVENING) (Patient taking differently: Take 60 mg by mouth See admin instructions. Take 120 mg by mouth every evening) 60 capsule 5 Past Week   esomeprazole (NEXIUM) 40 MG capsule TAKE ONE CAPSULE BY MOUTH ONCE DAILY (MORNING PER PT) 30 capsule 5 Past Week   fluticasone (FLONASE) 50 MCG/ACT nasal spray USE ONE SPRAY IN EACH NOSTRIL ONCE DAILY. SHAKE GENTLY BEFORE USING. (Patient taking differently: Place 1 spray into both nostrils daily. SHAKE GENTLY BEFORE USING.) 16 g 2 unknown   Fluticasone-Umeclidin-Vilant (TRELEGY ELLIPTA) 100-62.5-25 MCG/INH AEPB Inhale 1 puff into the lungs daily. 3 each 4 Past Week   guaiFENesin (MUCINEX) 600 MG 12 hr tablet Take 1 tablet (600 mg total) by mouth 2 (two) times daily as needed. 60 tablet 1 Past Week   Magnesium Hydroxide (DULCOLAX PO) Take 25 mg by mouth daily.   unknown   Multiple  Vitamins-Minerals (CENTRUM SILVER PO) Take 1 tablet by mouth daily.   Past Week   NON FORMULARY Inhale 5 L into the lungs daily. Oxygen 5 to 6 L every day   Past Week   nystatin cream (MYCOSTATIN) Apply 1 application topically 2 (two) times daily. 30 g 6 unknown   oxyCODONE-acetaminophen (PERCOCET) 5-325 MG per tablet Take 2 tablets by mouth every 8 (eight) hours as needed.   Past Week   potassium chloride (KLOR-CON) 10 MEQ tablet TAKE ONE TABLET BY MOUTH EVERY MORNING 30 tablet 0 Past Week   predniSONE (STERAPRED UNI-PAK 21 TAB) 10 MG (21) TBPK tablet Use as directed 21 tablet 0 Past Week   torsemide (DEMADEX) 20 MG tablet TAKE 1 AND 1/2 TABLETS TO 2 TABLETS BY MOUTH EVERY MORNING ,1& ,1/2 IN PACK EXTRA IN BOTTLE IF NEEDED 45 tablet 0 Past Week   vitamin B-12 (CYANOCOBALAMIN) 1000 MCG tablet TAKE ONE TABLET BY MOUTH DAILY WITH BREAKFAST (MORNING) (Patient taking differently: Take 1,000 mcg by mouth daily.) 30 tablet 5 Past Week   Vitamin D, Ergocalciferol, (DRISDOL) 1.25 MG (50000 UNIT) CAPS capsule Take 1 capsule (50,000 Units total) by mouth every 7 (seven) days. 12 capsule 3 Past Week   Spacer/Aero-Holding Chambers DEVI 1 Device by Does not apply route in the morning and at bedtime. 1 each 1    warfarin (COUMADIN) 5 MG tablet TAKE 1 OR 2 TABLETS BY MOUTH AS DIRECTED BY [facility] CLIN[facility] (,1-SU,M,W,TH,F,SA/ ,1 &,1/2-TU) 180 tablet 0 04/24/2021 at 0800    Assessment: 80 y.o. female with a history of chronic respiratory failure on 4 to 5 L home oxygen, COPD with pulmonary hypertension, chronic diastolic heart failure, hypertension, permanent A. fib on Coumadin for anticoagulation, essential hypertension, depression/anxiety. INR SUPRAtherapeutic on admission.  (Likely elevated d/t antibiotics)  INR 2.4 today, therapeutic. Last visit in chart has patient taking 5mg  daily and med rec has her taking 5mg  daily and 7.5mg  on Tuesday. Hgb 8.9 plt 206  Goal of Therapy:  INR 2-3 Monitor platelets by  anticoagulation protocol: Yes   Plan:  Coumadin 5mg  x 1 today Monitor INR daily Monitor for S/S of bleeding  Isac Sarna, BS [place], BCPS Clinical Pharmacist Pager [phone] 04/28/2021,12:13 PM

## 2021-04-28 NOTE — Progress Notes (Signed)
Patient seems anxious/restless and states she isn't breathing well. RR, HR, O2 sat and BP are all within normal limits. PRN neb given earlier and lung sounds are better. Family member wanted to try BIPAP. Patient placed on machine and at first was tolerating then started asking to take it off cause it was choking her. Will continue to monitor and next treatment is due at 0200. Told pt and family to call if they needed anything.

## 2021-04-29 DIAGNOSIS — J9620 Acute and chronic respiratory failure, unspecified whether with hypoxia or hypercapnia: Secondary | ICD-10-CM | POA: Diagnosis not present

## 2021-04-29 DIAGNOSIS — I4821 Permanent atrial fibrillation: Secondary | ICD-10-CM | POA: Diagnosis not present

## 2021-04-29 DIAGNOSIS — J441 Chronic obstructive pulmonary disease with (acute) exacerbation: Secondary | ICD-10-CM | POA: Diagnosis not present

## 2021-04-29 DIAGNOSIS — I4891 Unspecified atrial fibrillation: Secondary | ICD-10-CM | POA: Diagnosis not present

## 2021-04-29 LAB — CBC
HCT: 30.6 % — ABNORMAL LOW (ref 36.0–46.0)
Hemoglobin: 9.3 g/dL — ABNORMAL LOW (ref 12.0–15.0)
MCH: 29.3 pg (ref 26.0–34.0)
MCHC: 30.4 g/dL (ref 30.0–36.0)
MCV: 96.5 fL (ref 80.0–100.0)
Platelets: 226 10*3/uL (ref 150–400)
RBC: 3.17 MIL/uL — ABNORMAL LOW (ref 3.87–5.11)
RDW: 16.8 % — ABNORMAL HIGH (ref 11.5–15.5)
WBC: 10.9 10*3/uL — ABNORMAL HIGH (ref 4.0–10.5)
nRBC: 0.2 % (ref 0.0–0.2)

## 2021-04-29 LAB — BASIC METABOLIC PANEL
Anion gap: 10 (ref 5–15)
BUN: 60 mg/dL — ABNORMAL HIGH (ref 8–23)
CO2: 31 mmol/L (ref 22–32)
Calcium: 8 mg/dL — ABNORMAL LOW (ref 8.9–10.3)
Chloride: 99 mmol/L (ref 98–111)
Creatinine, Ser: 1.94 mg/dL — ABNORMAL HIGH (ref 0.44–1.00)
GFR, Estimated: 26 mL/min — ABNORMAL LOW (ref >60)
Glucose, Bld: 104 mg/dL — ABNORMAL HIGH (ref 70–99)
Potassium: 4.2 mmol/L (ref 3.5–5.1)
Sodium: 140 mmol/L (ref 135–145)

## 2021-04-29 LAB — PROTIME-INR
INR: 3.2 — ABNORMAL HIGH (ref 0.8–1.2)
Prothrombin Time: 33 seconds — ABNORMAL HIGH (ref 11.4–15.2)

## 2021-04-29 MED ORDER — GUAIFENESIN-CODEINE 100-10 MG/5ML PO SOLN
5.0000 mL | Freq: Once | ORAL | Status: AC
  Administered 2021-04-29: 5 mL via ORAL
  Filled 2021-04-29: qty 5

## 2021-04-29 MED ORDER — OXYCODONE-ACETAMINOPHEN 5-325 MG PO TABS
1.0000 | ORAL_TABLET | Freq: Three times a day (TID) | ORAL | Status: DC | PRN
  Administered 2021-04-29: 1 via ORAL
  Administered 2021-04-30 – 2021-05-02 (×6): 2 via ORAL
  Administered 2021-05-03: 1 via ORAL
  Administered 2021-05-03: 2 via ORAL
  Filled 2021-04-29 (×2): qty 2
  Filled 2021-04-29: qty 1
  Filled 2021-04-29 (×5): qty 2
  Filled 2021-04-29: qty 1

## 2021-04-29 MED ORDER — LACTULOSE 10 GM/15ML PO SOLN
30.0000 g | Freq: Once | ORAL | Status: AC
  Administered 2021-04-29: 30 g via ORAL
  Filled 2021-04-29: qty 60

## 2021-04-29 MED ORDER — DILTIAZEM HCL 30 MG PO TABS
30.0000 mg | ORAL_TABLET | Freq: Four times a day (QID) | ORAL | Status: DC
  Administered 2021-04-29 – 2021-04-30 (×3): 30 mg via ORAL
  Filled 2021-04-29 (×3): qty 1

## 2021-04-29 MED ORDER — HALOPERIDOL LACTATE 5 MG/ML IJ SOLN
10.0000 mg | Freq: Every day | INTRAMUSCULAR | Status: AC
  Administered 2021-04-29: 10 mg via INTRAMUSCULAR
  Filled 2021-04-29: qty 2

## 2021-04-29 MED ORDER — MENTHOL 3 MG MT LOZG
1.0000 | LOZENGE | OROMUCOSAL | Status: DC | PRN
  Administered 2021-04-29 – 2021-05-01 (×2): 3 mg via ORAL
  Filled 2021-04-29 (×3): qty 9

## 2021-04-29 MED ORDER — GUAIFENESIN 100 MG/5ML PO SOLN
15.0000 mL | Freq: Once | ORAL | Status: AC
  Administered 2021-04-29: 300 mg via ORAL
  Filled 2021-04-29: qty 5

## 2021-04-29 NOTE — TOC Progression Note (Signed)
Transition of Care [facility]) - Progression Note    Patient Details  Name: Melinda Collins MRN: [id number] Date of Birth: 11-Jan-1941  Transition of [facility]) CM/SW Contact  Natasha Bence, LCSW Phone Number: 04/29/2021, 12:13 PM  Clinical Narrative:    Patient requested Adv. Linda with Adv agreeable to provide [facility] services TOC to follow.         Expected Discharge Plan and Services                                                 Social Determinants of Health (SDOH) Interventions    Readmission Risk Interventions No flowsheet data found.

## 2021-04-29 NOTE — Progress Notes (Signed)
ANTICOAGULATION CONSULT NOTE -   Pharmacy Consult for Warfarin (home med) Indication: atrial fibrillation  Allergies  Allergen Reactions   Baclofen     Feels crazy   Niaspan [Niacin]    Flexeril [Cyclobenzaprine]     Feels crazy    Patient Measurements: Height: 5\' 4"  (162.6 cm) Weight: 72.3 kg (159 lb 6.3 oz) IBW/kg (Calculated) : 54.7  Vital Signs: Temp: 97.9 F (36.6 C) (10/13 1105) Temp Source: Oral (10/13 1105) BP: 121/102 (10/13 1400) Pulse Rate: 51 (10/13 1400)  Labs: Recent Labs    04/27/21 0421 04/28/21 0418 04/29/21 0436  HGB  --  8.6* 9.3*  HCT  --  28.8* 30.6*  PLT  --  243 226  LABPROT 29.1* 26.3* 33.0*  INR 2.8* 2.4* 3.2*  CREATININE 2.17* 2.10* 1.94*     Estimated Creatinine Clearance: 23.3 mL/min (A) (by C-G formula based on SCr of 1.94 mg/dL (H)).   Medical History: Past Medical History:  Diagnosis Date   Arthritis    COPD (chronic obstructive pulmonary disease) ([place])    spirometry 04/20/10 FEV1 1.38 (58%) with ration 70   Depression    Diastolic dysfunction    noted on echo w/some apparent volume issues in past but not severe    HLD (hyperlipidemia)    HOH (hard of hearing)    HTN (hypertension)    mild; pulmonary   Permanent atrial fibrillation (HCC)    Ruptured disk    Tobacco abuse    Uterine cancer (HCC)     Medications:  Medications Prior to Admission  Medication Sig Dispense Refill Last Dose   acetaminophen (TYLENOL) 325 MG tablet Take 650 mg by mouth every 6 (six) hours as needed for moderate pain.   Past Week   albuterol (PROVENTIL) (2.5 MG/3ML) 0.083% nebulizer solution INHALE CONTENTS OF 1 VIAL IN NEBULIZER EVERY 6 HOURS AS NEEDED FOR FOR WHEEZING OR SHORTNESS OF BREATH. (Patient taking differently: Take 2.5 mg by nebulization every 6 (six) hours as needed for shortness of breath.) 90 mL 5 Past Week   albuterol (VENTOLIN HFA) 108 (90 Base) MCG/ACT inhaler INHALE 1-2 PUFFS INTO THE LUNGS EVERY 4 TO 6 HOURS AS NEEDED FOR  SHORTNESS OF BREATH  (NEEDS TO BE SEEN BEFORE NEXT REFILL) 8.5 g 3 Past Week   amLODipine (NORVASC) 5 MG tablet TAKE ONE TABLET BY MOUTH ONCE DAILY (MORNING) 30 tablet 5 Past Week   atorvastatin (LIPITOR) 80 MG tablet TAKE ONE TABLET BY MOUTH DAILY AT 6 PM 30 tablet 0 Past Week   azithromycin (ZITHROMAX) 250 MG tablet Take 500 mg once, then 250 mg for four days 6 tablet 0 Past Week   busPIRone (BUSPAR) 5 MG tablet Take 1 tablet (5 mg total) by mouth 3 (three) times daily as needed. 90 tablet 2 Past Week   diazepam (VALIUM) 5 MG tablet Take 5 mg by mouth every 8 (eight) hours as needed for anxiety.   unknown   diclofenac sodium (VOLTAREN) 1 % GEL Apply 2 g topically 4 (four) times daily. 350 g 3 unknown   diltiazem (CARDIZEM) 30 MG tablet TAKE ONE TABLET BY MOUTH EVERY MORNING AND IN THE EVENING (MORNING,EVENING) 60 tablet 0 Past Week   DULoxetine (CYMBALTA) 60 MG capsule TAKE TWO (2) CAPSULES BY MOUTH DAILY (EVENING) (Patient taking differently: Take 60 mg by mouth See admin instructions. Take 120 mg by mouth every evening) 60 capsule 5 Past Week   esomeprazole (NEXIUM) 40 MG capsule TAKE ONE CAPSULE BY MOUTH ONCE DAILY (  MORNING PER PT) 30 capsule 5 Past Week   fluticasone (FLONASE) 50 MCG/ACT nasal spray USE ONE SPRAY IN EACH NOSTRIL ONCE DAILY. SHAKE GENTLY BEFORE USING. (Patient taking differently: Place 1 spray into both nostrils daily. SHAKE GENTLY BEFORE USING.) 16 g 2 unknown   Fluticasone-Umeclidin-Vilant (TRELEGY ELLIPTA) 100-62.5-25 MCG/INH AEPB Inhale 1 puff into the lungs daily. 3 each 4 Past Week   guaiFENesin (MUCINEX) 600 MG 12 hr tablet Take 1 tablet (600 mg total) by mouth 2 (two) times daily as needed. 60 tablet 1 Past Week   Magnesium Hydroxide (DULCOLAX PO) Take 25 mg by mouth daily.   unknown   Multiple Vitamins-Minerals (CENTRUM SILVER PO) Take 1 tablet by mouth daily.   Past Week   NON FORMULARY Inhale 5 L into the lungs daily. Oxygen 5 to 6 L every day   Past Week   nystatin  cream (MYCOSTATIN) Apply 1 application topically 2 (two) times daily. 30 g 6 unknown   oxyCODONE-acetaminophen (PERCOCET) 5-325 MG per tablet Take 2 tablets by mouth every 8 (eight) hours as needed.   Past Week   potassium chloride (KLOR-CON) 10 MEQ tablet TAKE ONE TABLET BY MOUTH EVERY MORNING 30 tablet 0 Past Week   predniSONE (STERAPRED UNI-PAK 21 TAB) 10 MG (21) TBPK tablet Use as directed 21 tablet 0 Past Week   torsemide (DEMADEX) 20 MG tablet TAKE 1 AND 1/2 TABLETS TO 2 TABLETS BY MOUTH EVERY MORNING ,1& ,1/2 IN PACK EXTRA IN BOTTLE IF NEEDED 45 tablet 0 Past Week   vitamin B-12 (CYANOCOBALAMIN) 1000 MCG tablet TAKE ONE TABLET BY MOUTH DAILY WITH BREAKFAST (MORNING) (Patient taking differently: Take 1,000 mcg by mouth daily.) 30 tablet 5 Past Week   Vitamin D, Ergocalciferol, (DRISDOL) 1.25 MG (50000 UNIT) CAPS capsule Take 1 capsule (50,000 Units total) by mouth every 7 (seven) days. 12 capsule 3 Past Week   Spacer/Aero-Holding Chambers DEVI 1 Device by Does not apply route in the morning and at bedtime. 1 each 1    warfarin (COUMADIN) 5 MG tablet TAKE 1 OR 2 TABLETS BY MOUTH AS DIRECTED BY WARFARIN CLINIC (,1-SU,M,W,TH,F,SA/ ,1 &,1/2-TU) 180 tablet 0 04/24/2021 at 0800    Assessment: 80 y.o. female with a history of chronic respiratory failure on 4 to 5 L home oxygen, COPD with pulmonary hypertension, chronic diastolic heart failure, hypertension, permanent A. fib on Coumadin for anticoagulation, essential hypertension, depression/anxiety. INR SUPRAtherapeutic on admission.  (Likely elevated d/t antibiotics)  INR 3.2 today, supratherapeutic. Hgb 8.9>9.3 plt 206>226  Goal of Therapy:  INR 2-3 Monitor platelets by anticoagulation protocol: Yes   Plan:  Hold warfarin tonight  Monitor INR daily Monitor for S/S of bleeding  Donna Christen Tasharra Nodine, PharmD, MBA, BCGP Clinical Pharmacist  04/29/2021,4:15 PM

## 2021-04-29 NOTE — Progress Notes (Signed)
Patient Demographics:    Melinda Collins, is a 80 y.o. female, DOB - July 09, 1941, OAC:[id number]  Admit date - 04/25/2021   Admitting Physician No admitting provider for patient encounter.  Outpatient Primary MD for the patient is [name], FNP  LOS - 4   Chief Complaint  Patient presents with   Chest Pain    C/o chest pain for last 2-3 days.  History of SOB with Home O2 @5 -6 liters.  Treated on Friday with z-pack and steroids by PCP for URI.        Subjective:    [name] today has no fevers, no emesis,  No chest pain,    Patient's daughter-in-law at bedside, questions answered --only used bipap briefly last night -Less confused, less agitated, more cooperative still has episodes of disorientation and confusion-but overall mentation has improved significantly --Voiding well  Assessment  & Plan :    Principal Problem:   Acute on chronic respiratory failure ([place]) Active Problems:   Pulmonary hypertension (HCC)   Permanent atrial fibrillation (HCC)   Pulmonary emphysema (HCC)   GAD (generalized anxiety disorder)   Hyperkalemia   Atrial fibrillation with RVR ([name])   COPD with acute exacerbation ([name])  Brief Summary:- 80 y.o. female with a history of chronic respiratory failure on 4 to 5 L home oxygen, COPD with pulmonary hypertension, chronic diastolic heart failure, hypertension, permanent A. fib on Coumadin for anticoagulation, essential hypertension, depression/anxiety  A/p 1)Chronic A. fib with RVR-- rate control improving, -We will attempt to wean off Cardizem drip -Transitioning to p.o. Cardizem, already on p.o. metoprolol -Patient with severe COPD/advanced pulmonary disease making it tricky to use amiodarone -Also worried about possible bronchospasms with beta-blockers -Continue metoprolol 25 mg twice daily and watch respiratory status more closely-while on  metoprolol -We will consider discontinuation of metoprolol once oral Cardizem has been titrated up -Echo with EF of 55 to 60%, severe biatrial enlargement, moderate aortic -Continue anticoagulation with Coumadin--  2) acute on chronic chronic hypoxic respiratory failure/COPD--- continue supplemental oxygen at 4 to 5 L, continue bronchodilators -Patient was transitioned from Solu-Medrol to prednisone -Stopped prednisone On 04/28/2021 due to concerns about agitation and possible steroid-induced psychosis  3)HFpEF--patient with chronic diastolic dysfunction CHF, echo as above #1 -Continue diuresis with IV Lasix -Hypoxia improving  4) pneumonia--CAP--upon further review of clinical and imaging data this was probably present on admission--POA -Continue Rocephin and doxycycline, along with bronchodilators and mucolytics -Leukocytosis improving -Repeat chest x-ray on 04/30/2021  5) CKD stage - IV-- -Creatinine trending down - renally adjust medications, avoid nephrotoxic agents/dehydration /hypotension  6) acute metabolic encephalopathy/agitation--- 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 fib with RVR requiring continuous IV Cardizem  Status is: Inpatient  Remains inpatient appropriate because: see disposition above  Disposition: The patient is from: Home              Anticipated d/c is to: Home  Anticipated d/c date is: 2 days              Patient currently is not medically stable to d/c. Barriers: Not Clinically Stable-   Code Status : -  Code Status: DNR   Family Communication:    (patient is alert, awake and  coherent)  Discussed with son and daughter in law at bedside  Consults  :  na  DVT Prophylaxis  :   - SCDs /coumadin    Lab Results  Component Value Date   PLT 226 04/29/2021    Inpatient Medications  Scheduled Meds:  aspirin EC  81 mg Oral Daily   atorvastatin  80 mg Oral q1800   Chlorhexidine Gluconate Cloth  6 each Topical Q0600   diltiazem  30 mg Oral Q6H   doxycycline  100 mg Oral Q12H   DULoxetine  120 mg Oral q1800   fluticasone furoate-vilanterol  1 puff Inhalation Daily   And   umeclidinium bromide  1 puff Inhalation Daily   furosemide  60 mg Intravenous BID   guaiFENesin  600 mg Oral BID   ipratropium  0.5 mg Nebulization Q6H   metoprolol tartrate  25 mg Oral BID   pantoprazole  40 mg Oral Daily   polyethylene glycol  17 g Oral BID   tamsulosin  0.4 mg Oral Daily   Warfarin - Pharmacist Dosing Inpatient   Does not apply q1600   Continuous Infusions:  cefTRIAXone (ROCEPHIN)  IV Stopped (04/28/21 1616)   diltiazem (CARDIZEM) infusion 2.5 mg/hr (04/29/21 1415)   PRN Meds:.busPIRone, haloperidol lactate, levalbuterol, LORazepam, magnesium hydroxide, menthol-cetylpyridinium, morphine injection, ondansetron **OR** ondansetron (ZOFRAN) IV, oxyCODONE-acetaminophen    Anti-infectives (From admission, onward)    Start     Dose/Rate Route Frequency Ordered Stop   04/28/21 1515  doxycycline (VIBRA-TABS) tablet 100 mg        100 mg Oral Every 12 hours 04/28/21 1421 05/05/21 0959   04/26/21 1600  cefTRIAXone (ROCEPHIN) 1 g in sodium chloride 0.9 % 100 mL IVPB        1 g 200 mL/hr over 30 Minutes Intravenous Every 24 hours 04/25/21 2241 05/01/21 1559   04/25/21 1700  cefTRIAXone (ROCEPHIN) 1 g in sodium chloride 0.9 % 100 mL IVPB        1 g 200 mL/hr over 30 Minutes Intravenous  Once 04/25/21 1647 04/25/21 1836         Objective:   Vitals:   04/29/21 1000 04/29/21 1100 04/29/21 1105 04/29/21 1400  BP: 116/90 112/71  (!) 121/102  Pulse:    (!) 51  Resp: 20 (!)  31 17 (!) 25  Temp:   97.9 F (36.6 C)   TempSrc:   Oral   SpO2:    96%  Weight:      Height:        Wt Readings from Last 3 Encounters:  04/25/21 72.3 kg  04/08/21 71.7 kg  12/28/20 71.9 kg     Intake/Output Summary (Last 24 hours) at 04/29/2021 1446 Last data filed at 04/29/2021 1000 Gross per 24 hour  Intake 266.15 ml  Output 600 ml  Net -333.85 ml   Physical Exam  Gen:- Awake Alert,  in no apparent distress  HEENT:- [place].AT, No sclera icterus Nose- [place] 4L/min Neck-Supple Neck,No JVD,.  Lungs-fair air movement, no significant wheezing  CV- S1, S2 normal, irregularly irregular and tachycardic  abd-  +ve B.Sounds, Abd Soft, No tenderness,    Extremity/Skin:- No  edema, pedal  pulses present  Psych--less confused, less agitated, more cooperative, Neuro-Generalized weakness, no new focal deficits, no tremors   Data Review:   Micro Results Recent Results (from the past 240 hour(s))  Resp Panel by RT-PCR (Flu A&B, Covid) Nasopharyngeal Swab     Status: None   Collection Time: 04/25/21  7:56 PM   Specimen: Nasopharyngeal Swab; Nasopharyngeal(NP) swabs in vial transport medium  Result Value Ref Range Status   SARS Coronavirus 2 by RT PCR NEGATIVE NEGATIVE Final    Comment: (NOTE) SARS-CoV-2 target nucleic acids are NOT DETECTED.  The SARS-CoV-2 RNA is generally detectable in upper respiratory specimens during the acute phase of infection. The lowest concentration of SARS-CoV-2 viral copies this assay can detect is 138 copies/mL. A negative result does not preclude SARS-Cov-2 infection and should not be used as the sole basis for treatment or other patient management decisions. A negative result may occur with  improper specimen collection/handling, submission of specimen other than nasopharyngeal swab, presence of viral mutation(s) within the areas targeted by this assay, and inadequate number of viral copies(<138 copies/mL). A negative result must be combined  with clinical observations, patient history, and epidemiological information. The expected result is Negative.  Fact Sheet for Patients:  EntrepreneurPulse.com.au  Fact Sheet for Healthcare Providers:  IncredibleEmployment.be  This test is no t yet approved or cleared by the [place] FDA and  has been authorized for detection and/or diagnosis of SARS-CoV-2 by FDA under an Emergency Use Authorization (EUA). This EUA will remain  in effect (meaning this test can be used) for the duration of the COVID-19 declaration under Section 564(b)(1) of the Act, 21 U.S.C.section 360bbb-3(b)(1), unless the authorization is terminated  or revoked sooner.       Influenza A by PCR NEGATIVE NEGATIVE Final   Influenza B by PCR NEGATIVE NEGATIVE Final    Comment: (NOTE) The Xpert Xpress SARS-CoV-2/FLU/RSV plus assay is intended as an aid in the diagnosis of influenza from Nasopharyngeal swab specimens and should not be used as a sole basis for treatment. Nasal washings and aspirates are unacceptable for Xpert Xpress SARS-CoV-2/FLU/RSV testing.  Fact Sheet for Patients: EntrepreneurPulse.com.au  Fact Sheet for Healthcare Providers: IncredibleEmployment.be  This test is not yet approved or cleared by the [place] FDA and has been authorized for detection and/or diagnosis of SARS-CoV-2 by FDA under an Emergency Use Authorization (EUA). This EUA will remain in effect (meaning this test can be used) for the duration of the COVID-19 declaration under Section 564(b)(1) of the Act, 21 U.S.C. section 360bbb-3(b)(1), unless the authorization is terminated or revoked.  Performed at [facility], [address] [place], [place] [address]   MRSA Next Gen by PCR, Nasal     Status: None   Collection Time: 04/25/21 10:42 PM   Specimen: Nasal Mucosa; Nasal Swab  Result Value Ref Range Status   MRSA by PCR Next Gen NOT DETECTED  NOT DETECTED Final    Comment: (NOTE) The GeneXpert MRSA Assay (FDA approved for NASAL specimens only), is one component of a comprehensive MRSA colonization surveillance program. It is not intended to diagnose MRSA infection nor to guide or monitor treatment for MRSA infections. Test performance is not FDA approved in patients less than 22 years old. Performed at [facility], [address], [place], [place] [address]     Radiology Reports DG CHEST PORT 1 VIEW  Result Date: 04/28/2021 CLINICAL DATA:  Short of breath and cough EXAM: PORTABLE CHEST 1 VIEW COMPARISON:  04/25/2021 FINDINGS:  Cardiac enlargement with mild vascular congestion. Negative for edema or effusion. Mild bibasilar airspace disease unchanged. IMPRESSION: No interval change from the prior study. Vascular congestion without edema. Mild bibasilar atelectasis/infiltrate. Electronically Signed   By: Franchot Gallo M.D.   On: 04/28/2021 12:28   DG Chest Port 1 View  Result Date: 04/25/2021 CLINICAL DATA:  Chest pain EXAM: PORTABLE CHEST 1 VIEW COMPARISON:  09/15/2020 FINDINGS: Cardiomegaly, vascular congestion. Mild hyperinflation of the lungs compatible with COPD. No confluent opacities or effusions. No overt edema. IMPRESSION: Cardiomegaly, vascular congestion. COPD. Electronically Signed   By: Rolm Baptise M.D.   On: 04/25/2021 15:58   ECHOCARDIOGRAM COMPLETE  Result Date: 04/26/2021    ECHOCARDIOGRAM REPORT   Patient Name:   NELDA LUCKEY Date of Exam: 04/26/2021 Medical Rec #:  [id number]          Height:       64.0 in Accession #:    [id number]         Weight:       159.4 lb Date of Birth:  [birth year]         BSA:          1.776 m Patient Age:    9 years           BP:           111/72 mmHg Patient Gender: F                  HR:           89 bpm. Exam Location:  [name] Procedure: 2D Echo, Cardiac Doppler and Color Doppler Indications:    Atrial Fibrillation  History:        Patient has no prior history of  Echocardiogram examinations.                 CHF, COPD, Arrythmias:Atrial Fibrillation; Risk                 Factors:Dyslipidemia. Tobacco Abuse.  Sonographer:    Wenda Low Referring Phys: [place]  1. Left ventricular ejection fraction, by estimation, is 55 to 60%. The left ventricle has normal function. The left ventricle has no regional wall motion abnormalities. There is mild left ventricular hypertrophy. Left ventricular diastolic parameters are indeterminate.  2. Right ventricular systolic function is normal. The right ventricular size is normal. There is moderately elevated pulmonary artery systolic pressure. The estimated right ventricular systolic pressure is 47.6 mmHg.  3. Left atrial size was severely dilated.  4. Right atrial size was severely dilated.  5. The mitral valve is degenerative. Moderate mitral annular calcification.     Moderate mitral valve regurgitation. Eccentric posterior directed jet, may be underestimating due to eccentric jet.  6. Tricuspid valve regurgitation is mild to moderate.  7. The aortic valve is tricuspid. There is moderate calcification of the aortic valve. Aortic valve regurgitation is mild. Moderate aortic valve stenosis. Vmax 2.9 m/s, MG 70mmHg, AVA 1.2 cm^2, DI 0.46  8. The inferior vena cava is dilated in size with <50% respiratory variability, suggesting right atrial pressure of 15 mmHg.  9. Evidence of atrial level shunting detected by color flow Doppler. FINDINGS  Left Ventricle: Left ventricular ejection fraction, by estimation, is 55 to 60%. The left ventricle has normal function. The left ventricle has no regional wall motion abnormalities. The left ventricular internal cavity size was normal in size. There is  mild left ventricular hypertrophy. Left ventricular diastolic  parameters are indeterminate. Right Ventricle: The right ventricular size is normal. No increase in right ventricular wall thickness. Right ventricular systolic  function is normal. There is moderately elevated pulmonary artery systolic pressure. The tricuspid regurgitant velocity is 3.34 m/s, and with an assumed right atrial pressure of 15 mmHg, the estimated right ventricular systolic pressure is 83.3 mmHg. Left Atrium: Left atrial size was severely dilated. Right Atrium: Right atrial size was severely dilated. Pericardium: There is no evidence of pericardial effusion. Mitral Valve: The mitral valve is degenerative in appearance. Moderate mitral annular calcification. Moderate mitral valve regurgitation. MV peak gradient, 12.5 mmHg. The mean mitral valve gradient is 3.0 mmHg. Tricuspid Valve: The tricuspid valve is normal in structure. Tricuspid valve regurgitation is mild to moderate. Aortic Valve: The aortic valve is tricuspid. There is moderate calcification of the aortic valve. Aortic valve regurgitation is mild. Moderate aortic stenosis is present. Aortic valve mean gradient measures 15.5 mmHg. Aortic valve peak gradient measures 28.9 mmHg. Aortic valve area, by VTI measures 1.62 cm. Pulmonic Valve: The pulmonic valve was not well visualized. Pulmonic valve regurgitation is trivial. Aorta: The aortic root and ascending aorta are structurally normal, with no evidence of dilitation. Venous: The inferior vena cava is dilated in size with less than 50% respiratory variability, suggesting right atrial pressure of 15 mmHg. IAS/Shunts: Evidence of atrial level shunting detected by color flow Doppler.  LEFT VENTRICLE PLAX 2D LVIDd:         5.00 cm   Diastology LVIDs:         3.40 cm   LV e' medial:    11.10 cm/s LV PW:         1.10 cm   LV E/e' medial:  15.3 LV IVS:        0.80 cm   LV e' lateral:   12.40 cm/s LVOT diam:     2.00 cm   LV E/e' lateral: 13.7 LV SV:         81 LV SV Index:   46 LVOT Area:     3.14 cm  RIGHT VENTRICLE RV Basal diam:  3.50 cm RV Mid diam:    3.00 cm RV S prime:     20.50 cm/s TAPSE (M-mode): 1.6 cm LEFT ATRIUM              Index        RIGHT  ATRIUM           Index LA diam:        6.10 cm  3.43 cm/m   RA Area:     26.50 cm LA Vol (A2C):   168.0 ml 94.57 ml/m  RA Volume:   81.50 ml  45.88 ml/m LA Vol (A4C):   149.0 ml 83.87 ml/m LA Biplane Vol: 159.0 ml 89.50 ml/m  AORTIC VALVE                     PULMONIC VALVE AV Area (Vmax):    1.85 cm      PV Vmax:       0.92 m/s AV Area (Vmean):   1.55 cm      PV Peak grad:  3.4 mmHg AV Area (VTI):     1.62 cm AV Vmax:           268.75 cm/s AV Vmean:          180.250 cm/s AV VTI:            0.499 m AV  Peak Grad:      28.9 mmHg AV Mean Grad:      15.5 mmHg LVOT Vmax:         158.67 cm/s LVOT Vmean:        88.700 cm/s LVOT VTI:          0.257 m LVOT/AV VTI ratio: 0.52  AORTA Ao Asc diam: 2.90 cm MITRAL VALVE                TRICUSPID VALVE MV Area (PHT): 4.99 cm     TR Peak grad:   44.6 mmHg MV Area VTI:   2.37 cm     TR Vmax:        334.00 cm/s MV Peak grad:  12.5 mmHg MV Mean grad:  3.0 mmHg     SHUNTS MV Vmax:       1.77 m/s     Systemic VTI:  0.26 m MV Vmean:      63.7 cm/s    Systemic Diam: 2.00 cm MV Decel Time: 152 msec MV E velocity: 170.00 cm/s Oswaldo Milian MD Electronically signed by Oswaldo Milian MD Signature Date/Time: 04/26/2021/11:29:14 AM    Final      CBC Recent Labs  Lab 04/25/21 1529 04/26/21 0416 04/28/21 0418 04/29/21 0436  WBC 11.8* 7.2 13.1* 10.9*  HGB 9.0* 8.9* 8.6* 9.3*  HCT 29.3* 30.0* 28.8* 30.6*  PLT 252 206 243 226  MCV 96.7 96.8 96.3 96.5  MCH 29.7 28.7 28.8 29.3  MCHC 30.7 29.7* 29.9* 30.4  RDW 16.7* 16.6* 16.5* 16.8*    Chemistries  Recent Labs  Lab 04/26/21 0416 04/26/21 0625 04/26/21 1454 04/26/21 1738 04/27/21 0421 04/28/21 0418 04/29/21 0436  NA 137  --  137  --  137 140 140  K 6.0*   < > 4.8 4.8 4.4 4.6 4.2  CL 101  --  101  --  99 102 99  CO2 30  --  27  --  29 26 31   GLUCOSE 178*  --  154*  --  200* 110* 104*  BUN 42*  --  47*  --  56* 65* 60*  CREATININE 1.99*  --  2.13*  --  2.17* 2.10* 1.94*  CALCIUM 8.9  --  8.7*  --   8.4* 8.0* 8.0*   < > = values in this interval not displayed.   ------------------------------------------------------------------------------------------------------------------ No results for input(s): CHOL, HDL, LDLCALC, TRIG, CHOLHDL, LDLDIRECT in the last 72 hours.  Lab Results  Component Value Date   HGBA1C 5.6% 03/12/2014   ------------------------------------------------------------------------------------------------------------------ No results for input(s): TSH, T4TOTAL, T3FREE, THYROIDAB in the last 72 hours.  Invalid input(s): FREET3 ------------------------------------------------------------------------------------------------------------------ No results for input(s): VITAMINB12, FOLATE, FERRITIN, TIBC, IRON, RETICCTPCT in the last 72 hours.  Coagulation profile Recent Labs  Lab 04/25/21 1529 04/26/21 0416 04/27/21 0421 04/28/21 0418 04/29/21 0436  INR 3.9* 3.7* 2.8* 2.4* 3.2*    No results for input(s): DDIMER in the last 72 hours.  Cardiac Enzymes No results for input(s): CKMB, TROPONINI, MYOGLOBIN in the last 168 hours.  Invalid input(s): CK ------------------------------------------------------------------------------------------------------------------    Component Value Date/Time   BNP 414.0 (H) 04/25/2021 1714   Roxan Hockey M.D on 04/29/2021 at 2:46 PM  Go to [email or web address] - for contact info  [facility] - Office  [phone]

## 2021-04-29 NOTE — Progress Notes (Signed)
Patient still wide awake and restless even though she has had all her meds. Pt family had requested she go on BIPAP machine tonight, but when RT came to place it on pt family member declined stating she was still too restless and had been going to the bathroom a lot this evening. Will follow up later.

## 2021-04-30 ENCOUNTER — Inpatient Hospital Stay (HOSPITAL_COMMUNITY): Payer: Medicare Other

## 2021-04-30 DIAGNOSIS — J441 Chronic obstructive pulmonary disease with (acute) exacerbation: Secondary | ICD-10-CM | POA: Diagnosis not present

## 2021-04-30 DIAGNOSIS — F411 Generalized anxiety disorder: Secondary | ICD-10-CM | POA: Diagnosis not present

## 2021-04-30 DIAGNOSIS — I4821 Permanent atrial fibrillation: Secondary | ICD-10-CM | POA: Diagnosis not present

## 2021-04-30 DIAGNOSIS — J9620 Acute and chronic respiratory failure, unspecified whether with hypoxia or hypercapnia: Secondary | ICD-10-CM | POA: Diagnosis not present

## 2021-04-30 LAB — BASIC METABOLIC PANEL
Anion gap: 8 (ref 5–15)
BUN: 54 mg/dL — ABNORMAL HIGH (ref 8–23)
CO2: 31 mmol/L (ref 22–32)
Calcium: 8.2 mg/dL — ABNORMAL LOW (ref 8.9–10.3)
Chloride: 101 mmol/L (ref 98–111)
Creatinine, Ser: 1.81 mg/dL — ABNORMAL HIGH (ref 0.44–1.00)
GFR, Estimated: 28 mL/min — ABNORMAL LOW (ref >60)
Glucose, Bld: 92 mg/dL (ref 70–99)
Potassium: 4.2 mmol/L (ref 3.5–5.1)
Sodium: 140 mmol/L (ref 135–145)

## 2021-04-30 LAB — RENAL FUNCTION PANEL
Albumin: 3.4 g/dL — ABNORMAL LOW (ref 3.5–5.0)
Anion gap: 9 (ref 5–15)
BUN: 54 mg/dL — ABNORMAL HIGH (ref 8–23)
CO2: 29 mmol/L (ref 22–32)
Calcium: 7.9 mg/dL — ABNORMAL LOW (ref 8.9–10.3)
Chloride: 100 mmol/L (ref 98–111)
Creatinine, Ser: 1.74 mg/dL — ABNORMAL HIGH (ref 0.44–1.00)
GFR, Estimated: 30 mL/min — ABNORMAL LOW (ref >60)
Glucose, Bld: 92 mg/dL (ref 70–99)
Phosphorus: 4.6 mg/dL (ref 2.5–4.6)
Potassium: 4.1 mmol/L (ref 3.5–5.1)
Sodium: 138 mmol/L (ref 135–145)

## 2021-04-30 LAB — CBC
HCT: 30.2 % — ABNORMAL LOW (ref 36.0–46.0)
Hemoglobin: 9.3 g/dL — ABNORMAL LOW (ref 12.0–15.0)
MCH: 29.2 pg (ref 26.0–34.0)
MCHC: 30.8 g/dL (ref 30.0–36.0)
MCV: 94.7 fL (ref 80.0–100.0)
Platelets: 212 10*3/uL (ref 150–400)
RBC: 3.19 MIL/uL — ABNORMAL LOW (ref 3.87–5.11)
RDW: 16.5 % — ABNORMAL HIGH (ref 11.5–15.5)
WBC: 14.1 10*3/uL — ABNORMAL HIGH (ref 4.0–10.5)
nRBC: 0 % (ref 0.0–0.2)

## 2021-04-30 LAB — PROTIME-INR
INR: 3.4 — ABNORMAL HIGH (ref 0.8–1.2)
Prothrombin Time: 34 seconds — ABNORMAL HIGH (ref 11.4–15.2)

## 2021-04-30 MED ORDER — HALOPERIDOL LACTATE 5 MG/ML IJ SOLN
5.0000 mg | Freq: Two times a day (BID) | INTRAMUSCULAR | Status: DC | PRN
  Administered 2021-04-30: 5 mg via INTRAMUSCULAR
  Filled 2021-04-30: qty 1

## 2021-04-30 MED ORDER — DILTIAZEM HCL ER COATED BEADS 180 MG PO CP24
180.0000 mg | ORAL_CAPSULE | Freq: Every day | ORAL | Status: DC
  Administered 2021-04-30 – 2021-05-03 (×4): 180 mg via ORAL
  Filled 2021-04-30 (×4): qty 1

## 2021-04-30 MED ORDER — IPRATROPIUM BROMIDE 0.02 % IN SOLN
0.5000 mg | Freq: Three times a day (TID) | RESPIRATORY_TRACT | Status: DC
  Administered 2021-05-01: 0.5 mg via RESPIRATORY_TRACT
  Filled 2021-04-30 (×2): qty 2.5

## 2021-04-30 MED ORDER — HALOPERIDOL LACTATE 5 MG/ML IJ SOLN
2.0000 mg | Freq: Four times a day (QID) | INTRAMUSCULAR | Status: DC | PRN
  Filled 2021-04-30: qty 1

## 2021-04-30 MED ORDER — METOPROLOL SUCCINATE ER 50 MG PO TB24
50.0000 mg | ORAL_TABLET | Freq: Every evening | ORAL | Status: DC
  Administered 2021-04-30 – 2021-05-02 (×3): 50 mg via ORAL
  Filled 2021-04-30 (×3): qty 1

## 2021-04-30 MED ORDER — DIAZEPAM 5 MG PO TABS
5.0000 mg | ORAL_TABLET | Freq: Every day | ORAL | Status: AC
  Administered 2021-04-30: 5 mg via ORAL
  Filled 2021-04-30: qty 1

## 2021-04-30 NOTE — Progress Notes (Signed)
Patient Demographics:    Melinda Collins, is a 80 y.o. female, DOB - 01-10-1941, OVF:[id number]  Admit date - 04/25/2021   Admitting Physician No admitting provider for patient encounter.  Outpatient Primary MD for the patient is [name], FNP  LOS - 5   Chief Complaint  Patient presents with   Chest Pain    C/o chest pain for last 2-3 days.  History of SOB with Home O2 @5 -6 liters.  Treated on Friday with z-pack and steroids by PCP for URI.        Subjective:    [name] today has no fevers, no emesis,  No chest pain,    -Grandson and daughter-in-law at bedside --Voiding well -Somewhat restless -Cough improved  Assessment  & Plan :    Principal Problem:   Acute on chronic respiratory failure (HCC) Active Problems:   Pulmonary hypertension (HCC)   Permanent atrial fibrillation (HCC)   Pulmonary emphysema (HCC)   GAD (generalized anxiety disorder)   Hyperkalemia   Atrial fibrillation with RVR (HCC)   COPD with acute exacerbation (HCC)  Brief Summary:- 80 y.o. female with a history of chronic respiratory failure on 4 to 5 L home oxygen, COPD with pulmonary hypertension, chronic diastolic heart failure, hypertension, permanent A. fib on Coumadin for anticoagulation, essential hypertension, depression/anxiety admitted with acute on chronic hypoxic respiratory failure in the setting of A. fib with RVR and CHF exacerbation  A/p 1)Chronic A. fib with RVR-- rate control improving, Patient has been weaned off IV Cardizem drip -Transitioned to Cardizem CD 180 mg daily and Toprol-XL 50 mg daily -Transitioning to p.o. Cardizem, already on p.o. metoprolol -Echo with EF of 55 to 60%, severe biatrial enlargement, moderate aortic -Continue anticoagulation with Coumadin for stroke prophylaxis  2)Acute on chronic chronic hypoxic respiratory failure/COPD---  -Oxygen requirement has  improved currently requiring 2 L of oxygen via nasal cannula -continue bronchodilators -Patient was transitioned from Solu-Medrol to prednisone -Stopped prednisone On 04/28/2021 due to concerns about agitation and possible steroid-induced psychosis  3)HFpEF--patient with chronic diastolic dysfunction CHF, echo as above #1 -Clinical exam and chest x-ray from 04/30/2021 consistent with CHF/pulmonary venous congestion -Continue diuresis with IV Lasix -Hypoxia improving -Daily weights and I's and O's Last Weight  Most recent update: 04/30/2021  4:49 PM    Weight  71.2 kg (157 lb)             4) pneumonia--CAP--upon further review of clinical and imaging data this was probably present on admission--POA -Completed IV Rocephin --Okay to complete doxycycline,  C/n bronchodilators and mucolytics -Persistent leukocytosis in the setting of recent steroid therapy  5)Aki on CKD stage - IV-- -Creatinine trending down 2.27 >>1.81 -Monitor renal function closely with IV diuresis - renally adjust medications, avoid nephrotoxic agents/dehydration /hypotension  6) acute metabolic encephalopathy/agitation--- 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 fib with RVR and CHF requiring IV diuresis -Anticipate discharge home in 1 to  2 days with home health services and possible DME Status is: Inpatient  Remains inpatient appropriate because: see disposition above  Disposition: The patient is from: Home              Anticipated d/c is to: Home with HH in 1 to 2 days               Anticipated d/c date is: 2 days               Patient currently is not medically stable to d/c. Barriers: Not Clinically Stable-   Code Status : -  Code Status: DNR   Family Communication:    (patient is alert, awake and coherent)  Discussed with son, grandson and daughter in law at bedside  Consults  :  na  DVT Prophylaxis  :   - SCDs /coumadin    Lab Results  Component Value Date   PLT 212 04/30/2021    Inpatient Medications  Scheduled Meds:  aspirin EC  81 mg Oral Daily   atorvastatin  80 mg Oral q1800   Chlorhexidine Gluconate Cloth  6 each Topical Q0600   diazepam  5 mg Oral QHS   diltiazem  180 mg Oral Daily   doxycycline  100 mg Oral Q12H   DULoxetine  120 mg Oral q1800   fluticasone furoate-vilanterol  1 puff Inhalation Daily   And   umeclidinium bromide  1 puff Inhalation Daily   furosemide  60 mg Intravenous BID   guaiFENesin  600 mg Oral BID   ipratropium  0.5 mg Nebulization Q6H   metoprolol succinate  50 mg Oral QPM   pantoprazole  40 mg Oral Daily   polyethylene glycol  17 g Oral BID   tamsulosin  0.4 mg Oral Daily   Warfarin - Pharmacist Dosing Inpatient   Does not apply q1600   Continuous Infusions:   PRN Meds:.haloperidol lactate, levalbuterol, LORazepam, magnesium hydroxide, menthol-cetylpyridinium, ondansetron **OR** ondansetron (ZOFRAN) IV, oxyCODONE-acetaminophen    Anti-infectives (From admission, onward)    Start     Dose/Rate Route Frequency Ordered Stop   04/28/21 1515  doxycycline (VIBRA-TABS) tablet 100 mg        100 mg Oral Every 12 hours 04/28/21 1421 05/05/21 0959   04/26/21 1600  cefTRIAXone (ROCEPHIN) 1 g in sodium chloride 0.9 % 100 mL IVPB  Status:  Discontinued        1 g 200 mL/hr over 30 Minutes Intravenous Every 24 hours 04/25/21 2241 04/30/21 0838   04/25/21 1700  cefTRIAXone (ROCEPHIN) 1 g in sodium chloride 0.9 % 100 mL IVPB        1 g 200 mL/hr over 30 Minutes Intravenous  Once 04/25/21 1647 04/25/21 1836         Objective:   Vitals:   04/30/21 0700  04/30/21 1035 04/30/21 1100 04/30/21 1558  BP:  122/81    Pulse:      Resp:      Temp: (!) 97.5 F (36.4 C)  97.7 F (36.5 C) 97.9 F (36.6 C)  TempSrc: Oral  Oral Oral  SpO2:      Weight:      Height:        Wt Readings from Last 3 Encounters:  04/25/21 72.3 kg  04/08/21 71.7 kg  12/28/20 71.9 kg     Intake/Output Summary (Last 24 hours) at 04/30/2021 1645 Last data filed at 04/29/2021 1900 Gross per 24 hour  Intake 131.18 ml  Output 300 ml  Net -168.82 ml   Physical Exam  Gen:- Awake Alert,  in no apparent distress  HEENT:- [place].AT, No sclera icterus Nose- [place] 3L/min Neck-Supple Neck,No JVD,.  Lungs-fair air movement, no significant wheezing  CV- S1, S2 normal, irregularly irregular and tachycardic  abd-  +ve B.Sounds, Abd Soft, No tenderness,    Extremity/Skin:- No  edema, pedal pulses present  Psych--affect is anxious, alert and oriented x3, cooperative  neuro-Generalized weakness, no new focal deficits, no tremors   Data Review:   Micro Results Recent Results (from the past 240 hour(s))  Resp Panel by RT-PCR (Flu A&B, Covid) Nasopharyngeal Swab     Status: None   Collection Time: 04/25/21  7:56 PM   Specimen: Nasopharyngeal Swab; Nasopharyngeal(NP) swabs in vial transport medium  Result Value Ref Range Status   SARS Coronavirus 2 by RT PCR NEGATIVE NEGATIVE Final    Comment: (NOTE) SARS-CoV-2 target nucleic acids are NOT DETECTED.  The SARS-CoV-2 RNA is generally detectable in upper respiratory specimens during the acute phase of infection. The lowest concentration of SARS-CoV-2 viral copies this assay can detect is 138 copies/mL. A negative result does not preclude SARS-Cov-2 infection and should not be used as the sole basis for treatment or other patient management decisions. A negative result may occur with  improper specimen collection/handling, submission of specimen other than nasopharyngeal swab, presence of viral mutation(s) within the areas  targeted by this assay, and inadequate number of viral copies(<138 copies/mL). A negative result must be combined with clinical observations, patient history, and epidemiological information. The expected result is Negative.  Fact Sheet for Patients:  EntrepreneurPulse.com.au  Fact Sheet for Healthcare Providers:  IncredibleEmployment.be  This test is no t yet approved or cleared by the [place] FDA and  has been authorized for detection and/or diagnosis of SARS-CoV-2 by FDA under an Emergency Use Authorization (EUA). This EUA will remain  in effect (meaning this test can be used) for the duration of the COVID-19 declaration under Section 564(b)(1) of the Act, 21 U.S.C.section 360bbb-3(b)(1), unless the authorization is terminated  or revoked sooner.       Influenza A by PCR NEGATIVE NEGATIVE Final   Influenza B by PCR NEGATIVE NEGATIVE Final    Comment: (NOTE) The Xpert Xpress SARS-CoV-2/FLU/RSV plus assay is intended as an aid in the diagnosis of influenza from Nasopharyngeal swab specimens and should not be used as a sole basis for treatment. Nasal washings and aspirates are unacceptable for Xpert Xpress SARS-CoV-2/FLU/RSV testing.  Fact Sheet for Patients: EntrepreneurPulse.com.au  Fact Sheet for Healthcare Providers: IncredibleEmployment.be  This test is not yet approved or cleared by the [place] FDA and has been authorized for detection and/or diagnosis of SARS-CoV-2 by FDA under an Emergency Use Authorization (EUA). This EUA will remain in effect (meaning this test can be used) for the duration of the COVID-19 declaration under Section 564(b)(1) of the Act, 21 U.S.C. section 360bbb-3(b)(1), unless the authorization is terminated or revoked.  Performed at [facility], [address], [place],  [address]   MRSA Next Gen by PCR, Nasal     Status: None   Collection Time: 04/25/21  10:42 PM   Specimen: Nasal Mucosa; Nasal Swab  Result Value Ref Range Status   MRSA by PCR Next Gen NOT DETECTED NOT DETECTED Final    Comment: (NOTE) The GeneXpert MRSA Assay (FDA approved for NASAL specimens only), is one component of a comprehensive MRSA colonization surveillance program. It is not intended to diagnose MRSA infection nor  to guide or monitor treatment for MRSA infections. Test performance is not FDA approved in patients less than 27 years old. Performed at [facility], [address], [place], [place] [address]     Radiology Reports DG CHEST PORT 1 VIEW  Result Date: 04/30/2021 CLINICAL DATA:  Acute on chronic respiratory failure.  COPD. EXAM: PORTABLE CHEST 1 VIEW COMPARISON:  04/28/2021 FINDINGS: Cardiac enlargement. Prominent pulmonary vascularity. Diffuse interstitial infiltrates bilaterally have progressed significantly in the interval. This may represent edema or infection. Small right pleural effusion. IMPRESSION: Significant progression of diffuse bilateral infiltrates. Favor edema over pneumonia. Electronically Signed   By: Franchot Gallo M.D.   On: 04/30/2021 07:55   DG CHEST PORT 1 VIEW  Result Date: 04/28/2021 CLINICAL DATA:  Short of breath and cough EXAM: PORTABLE CHEST 1 VIEW COMPARISON:  04/25/2021 FINDINGS: Cardiac enlargement with mild vascular congestion. Negative for edema or effusion. Mild bibasilar airspace disease unchanged. IMPRESSION: No interval change from the prior study. Vascular congestion without edema. Mild bibasilar atelectasis/infiltrate. Electronically Signed   By: Franchot Gallo M.D.   On: 04/28/2021 12:28   DG Chest Port 1 View  Result Date: 04/25/2021 CLINICAL DATA:  Chest pain EXAM: PORTABLE CHEST 1 VIEW COMPARISON:  09/15/2020 FINDINGS: Cardiomegaly, vascular congestion. Mild hyperinflation of the lungs compatible with COPD. No confluent opacities or effusions. No overt edema. IMPRESSION: Cardiomegaly, vascular congestion. COPD.  Electronically Signed   By: Rolm Baptise M.D.   On: 04/25/2021 15:58   ECHOCARDIOGRAM COMPLETE  Result Date: 04/26/2021    ECHOCARDIOGRAM REPORT   Patient Name:   MARLEI GLOMSKI Date of Exam: 04/26/2021 Medical Rec #:  [id number]          Height:       64.0 in Accession #:    [id number]         Weight:       159.4 lb Date of Birth:  10/22/1940         BSA:          1.776 m Patient Age:    89 years           BP:           111/72 mmHg Patient Gender: F                  HR:           89 bpm. Exam Location:  [name] Procedure: 2D Echo, Cardiac Doppler and Color Doppler Indications:    Atrial Fibrillation  History:        Patient has no prior history of Echocardiogram examinations.                 CHF, COPD, Arrythmias:Atrial Fibrillation; Risk                 Factors:Dyslipidemia. Tobacco Abuse.  Sonographer:    Wenda Low Referring Phys: [place]  1. Left ventricular ejection fraction, by estimation, is 55 to 60%. The left ventricle has normal function. The left ventricle has no regional wall motion abnormalities. There is mild left ventricular hypertrophy. Left ventricular diastolic parameters are indeterminate.  2. Right ventricular systolic function is normal. The right ventricular size is normal. There is moderately elevated pulmonary artery systolic pressure. The estimated right ventricular systolic pressure is 87.8 mmHg.  3. Left atrial size was severely dilated.  4. Right atrial size was severely dilated.  5. The mitral valve is degenerative. Moderate mitral annular calcification.  Moderate mitral valve regurgitation. Eccentric posterior directed jet, may be underestimating due to eccentric jet.  6. Tricuspid valve regurgitation is mild to moderate.  7. The aortic valve is tricuspid. There is moderate calcification of the aortic valve. Aortic valve regurgitation is mild. Moderate aortic valve stenosis. Vmax 2.9 m/s, MG 84mmHg, AVA 1.2 cm^2, DI 0.46  8. The inferior  vena cava is dilated in size with <50% respiratory variability, suggesting right atrial pressure of 15 mmHg.  9. Evidence of atrial level shunting detected by color flow Doppler. FINDINGS  Left Ventricle: Left ventricular ejection fraction, by estimation, is 55 to 60%. The left ventricle has normal function. The left ventricle has no regional wall motion abnormalities. The left ventricular internal cavity size was normal in size. There is  mild left ventricular hypertrophy. Left ventricular diastolic parameters are indeterminate. Right Ventricle: The right ventricular size is normal. No increase in right ventricular wall thickness. Right ventricular systolic function is normal. There is moderately elevated pulmonary artery systolic pressure. The tricuspid regurgitant velocity is 3.34 m/s, and with an assumed right atrial pressure of 15 mmHg, the estimated right ventricular systolic pressure is 16.1 mmHg. Left Atrium: Left atrial size was severely dilated. Right Atrium: Right atrial size was severely dilated. Pericardium: There is no evidence of pericardial effusion. Mitral Valve: The mitral valve is degenerative in appearance. Moderate mitral annular calcification. Moderate mitral valve regurgitation. MV peak gradient, 12.5 mmHg. The mean mitral valve gradient is 3.0 mmHg. Tricuspid Valve: The tricuspid valve is normal in structure. Tricuspid valve regurgitation is mild to moderate. Aortic Valve: The aortic valve is tricuspid. There is moderate calcification of the aortic valve. Aortic valve regurgitation is mild. Moderate aortic stenosis is present. Aortic valve mean gradient measures 15.5 mmHg. Aortic valve peak gradient measures 28.9 mmHg. Aortic valve area, by VTI measures 1.62 cm. Pulmonic Valve: The pulmonic valve was not well visualized. Pulmonic valve regurgitation is trivial. Aorta: The aortic root and ascending aorta are structurally normal, with no evidence of dilitation. Venous: The inferior vena cava  is dilated in size with less than 50% respiratory variability, suggesting right atrial pressure of 15 mmHg. IAS/Shunts: Evidence of atrial level shunting detected by color flow Doppler.  LEFT VENTRICLE PLAX 2D LVIDd:         5.00 cm   Diastology LVIDs:         3.40 cm   LV e' medial:    11.10 cm/s LV PW:         1.10 cm   LV E/e' medial:  15.3 LV IVS:        0.80 cm   LV e' lateral:   12.40 cm/s LVOT diam:     2.00 cm   LV E/e' lateral: 13.7 LV SV:         81 LV SV Index:   46 LVOT Area:     3.14 cm  RIGHT VENTRICLE RV Basal diam:  3.50 cm RV Mid diam:    3.00 cm RV S prime:     20.50 cm/s TAPSE (M-mode): 1.6 cm LEFT ATRIUM              Index        RIGHT ATRIUM           Index LA diam:        6.10 cm  3.43 cm/m   RA Area:     26.50 cm LA Vol (A2C):   168.0 ml 94.57 ml/m  RA Volume:  81.50 ml  45.88 ml/m LA Vol (A4C):   149.0 ml 83.87 ml/m LA Biplane Vol: 159.0 ml 89.50 ml/m  AORTIC VALVE                     PULMONIC VALVE AV Area (Vmax):    1.85 cm      PV Vmax:       0.92 m/s AV Area (Vmean):   1.55 cm      PV Peak grad:  3.4 mmHg AV Area (VTI):     1.62 cm AV Vmax:           268.75 cm/s AV Vmean:          180.250 cm/s AV VTI:            0.499 m AV Peak Grad:      28.9 mmHg AV Mean Grad:      15.5 mmHg LVOT Vmax:         158.67 cm/s LVOT Vmean:        88.700 cm/s LVOT VTI:          0.257 m LVOT/AV VTI ratio: 0.52  AORTA Ao Asc diam: 2.90 cm MITRAL VALVE                TRICUSPID VALVE MV Area (PHT): 4.99 cm     TR Peak grad:   44.6 mmHg MV Area VTI:   2.37 cm     TR Vmax:        334.00 cm/s MV Peak grad:  12.5 mmHg MV Mean grad:  3.0 mmHg     SHUNTS MV Vmax:       1.77 m/s     Systemic VTI:  0.26 m MV Vmean:      63.7 cm/s    Systemic Diam: 2.00 cm MV Decel Time: 152 msec MV E velocity: 170.00 cm/s Oswaldo Milian MD Electronically signed by Oswaldo Milian MD Signature Date/Time: 04/26/2021/11:29:14 AM    Final      CBC Recent Labs  Lab 04/25/21 1529 04/26/21 0416 04/28/21 0418  04/29/21 0436 04/30/21 0450  WBC 11.8* 7.2 13.1* 10.9* 14.1*  HGB 9.0* 8.9* 8.6* 9.3* 9.3*  HCT 29.3* 30.0* 28.8* 30.6* 30.2*  PLT 252 206 243 226 212  MCV 96.7 96.8 96.3 96.5 94.7  MCH 29.7 28.7 28.8 29.3 29.2  MCHC 30.7 29.7* 29.9* 30.4 30.8  RDW 16.7* 16.6* 16.5* 16.8* 16.5*    Chemistries  Recent Labs  Lab 04/26/21 1454 04/26/21 1738 04/27/21 0421 04/28/21 0418 04/29/21 0436 04/30/21 0450  NA 137  --  137 140 140 138  140  K 4.8 4.8 4.4 4.6 4.2 4.1  4.2  CL 101  --  99 102 99 100  101  CO2 27  --  29 26 31 29  31   GLUCOSE 154*  --  200* 110* 104* 92  92  BUN 47*  --  56* 65* 60* 54*  54*  CREATININE 2.13*  --  2.17* 2.10* 1.94* 1.74*  1.81*  CALCIUM 8.7*  --  8.4* 8.0* 8.0* 7.9*  8.2*   ------------------------------------------------------------------------------------------------------------------ No results for input(s): CHOL, HDL, LDLCALC, TRIG, CHOLHDL, LDLDIRECT in the last 72 hours.  Lab Results  Component Value Date   HGBA1C 5.6% 03/12/2014   ------------------------------------------------------------------------------------------------------------------ No results for input(s): TSH, T4TOTAL, T3FREE, THYROIDAB in the last 72 hours.  Invalid input(s): FREET3 ------------------------------------------------------------------------------------------------------------------ No results for input(s): VITAMINB12, FOLATE, FERRITIN, TIBC, IRON, RETICCTPCT in the last 72 hours.  Coagulation profile Recent Labs  Lab 04/26/21 0416 04/27/21 0421 04/28/21 0418 04/29/21 0436 04/30/21 0450  INR 3.7* 2.8* 2.4* 3.2* 3.4*    No results for input(s): DDIMER in the last 72 hours.  Cardiac Enzymes No results for input(s): CKMB, TROPONINI, MYOGLOBIN in the last 168 hours.  Invalid input(s): CK ------------------------------------------------------------------------------------------------------------------    Component Value Date/Time   BNP 414.0 (H)  04/25/2021 1714   Roxan Hockey M.D on 04/30/2021 at 4:45 PM  Go to [email or web address] - for contact info  [facility] - Office  [phone]

## 2021-04-30 NOTE — Plan of Care (Signed)
  Problem: Acute Rehab OT Goals (only OT should resolve) Goal: Pt. Will Perform Grooming Flowsheets (Taken 04/30/2021 0927) Pt Will Perform Grooming:  Independently  standing Goal: Pt. Will Perform Lower Body Dressing Flowsheets (Taken 04/30/2021 0927) Pt Will Perform Lower Body Dressing:  Independently  sitting/lateral leans  sit to/from stand Goal: Pt. Will Transfer To Toilet [place] (Taken 04/30/2021 [phone]) Pt Will Transfer to Toilet:  Independently  stand pivot transfer Goal: Pt/Caregiver Will Perform Home Exercise Program Flowsheets (Taken 04/30/2021 0927) Pt/caregiver will Perform Home Exercise Program:  Increased ROM  Both right and left upper extremity  With Supervision  [place] OT, MOT

## 2021-04-30 NOTE — Progress Notes (Addendum)
Patient's IV had to be removed during dayshift. This RN attempted to stick patient however was unsuccessful. Patient's daughter refused to allow [place] to attempt another IV. Daughter educated on importance of having IV access. Daughter understood but requested to not stick her anymore.

## 2021-04-30 NOTE — Progress Notes (Signed)
Patient is sleeping well currently. O2 sat 100% and she is in no distress. Skipped breathing treatment so patient could rest and let RN know.

## 2021-04-30 NOTE — Evaluation (Addendum)
Physical Therapy Evaluation Patient Details Name: Melinda Collins MRN: [id number] DOB: [birth year] Today's Date: 04/30/2021  History of Present Illness  Melinda Collins is a 80 y.o. female with a history of chronic respiratory failure on 4 to 5 L home oxygen, COPD with pulmonary hypertension, chronic diastolic heart failure, hypertension, permanent A. fib on Coumadin for anticoagulation, essential hypertension, depression/anxiety.  Patient seen for worsening shortness of breath that started approximately a week ago.  Patient's symptoms have been worsening with increasing difficulty with exertion.  She does have cough with purulent sputum production.  No fevers or chills.  She had a virtual appointment with her PCP, who prescribed prednisone and azithromycin.  Despite this, her symptoms have been worsening.  No other palliating or provoking factors.  She has been compliant with her inhalers and has been using her albuterol inhaler pretty regularly with minimal improvement.  She does report increasing palpitations and feel like her heart beat is beating really fast.  She has been compliant with her Cardizem.   Clinical Impression  Patient awake and alert in bed w/ her grandson at her bedside. Required encouragement to participate in therapy today.  Patient performed bed mobility with slow movement, increased time and HOB raised and ambulated to and from bedside toilet with Min guard/supervision assist w/out the use of a RW. Demonstrated slight unsteadiness on feet, no loss of balance. Required Min guard/Min assist to perform STS transfer from toilet and chair. Patient will benefit from continued physical therapy in hospital and recommended venue below to increase strength, balance, endurance for safe ADLs and gait.      Recommendations for follow up therapy are one component of a multi-disciplinary discharge planning process, led by the attending physician.  Recommendations may be updated based on  patient status, additional functional criteria and insurance authorization.  Follow Up Recommendations Home health PT;Supervision for mobility/OOB;Supervision - Intermittent    Equipment Recommendations  Other (comment) (transport chair)    Recommendations for Other Services       Precautions / Restrictions Precautions Precautions: Fall Restrictions Weight Bearing Restrictions: No      Mobility  Bed Mobility Overal bed mobility: Needs Assistance Bed Mobility: Supine to Sit;Sit to Supine     Supine to sit: Modified independent (Device/Increase time) Sit to supine: Modified independent (Device/Increase time)   General bed mobility comments: Refer to OT notes    Transfers Overall transfer level: Needs assistance Equipment used: None Transfers: Sit to/from [facility] Sit to Stand: Min guard;Min assist Stand pivot transfers: Min assist;Min guard       General transfer comment: Refer to OT notes  Ambulation/Gait Ambulation/Gait assistance: Supervision;Min guard Gait Distance (Feet): 6 Feet Assistive device: None Gait Pattern/deviations: Decreased step length - left;Decreased step length - right;Decreased stride length Gait velocity: decreased   General Gait Details: Patient slightly unsteady on feet, no loss of balance, impulsive  Stairs            Wheelchair Mobility    Modified Rankin (Stroke Patients Only)       Balance Overall balance assessment: Needs assistance Sitting-balance support: No upper extremity supported;Feet supported Sitting balance-Leahy Scale: Good Sitting balance - Comments: seated EOB   Standing balance support: No upper extremity supported;During functional activity Standing balance-Leahy Scale: Fair Standing balance comment: Refer to OT notes                             Pertinent Vitals/Pain Pain  Assessment: No/denies pain    Home Living Family/patient expects to be discharged to:: Private  residence Living Arrangements: Alone Available Help at Discharge: Family;Available PRN/intermittently Type of Home: Mobile home Home Access: Ramped entrance     Home Layout: One level Home Equipment: [place] - 2 wheels;Shower seat;Wheelchair - manual;Bedside commode      Prior Function Level of Independence: Needs assistance   Gait / Transfers Assistance Needed: Household ambulator with RW PRN  ADL's / Homemaking Assistance Needed: assisted by family for ADLs and IADLs        Hand Dominance   Dominant Hand: Right    Extremity/Trunk Assessment   Upper Extremity Assessment Upper Extremity Assessment: Defer to OT evaluation RUE Deficits / Details: ~90* A/ROM shoulder flexion. WFL P/ROM. Generally weak otherwise. LUE Deficits / Details: ~90* A/ROM shoulder flexion. WFL P/ROM. Generally weak otherwise.    Lower Extremity Assessment Lower Extremity Assessment: Generalized weakness    Cervical / Trunk Assessment Cervical / Trunk Assessment: Normal  Communication   Communication: HOH  Cognition Arousal/Alertness: Awake/alert Behavior During Therapy: WFL for tasks assessed/performed Overall Cognitive Status: Within Functional Limits for tasks assessed                                 General Comments: Pt oriented but at times reluctant to participate in evaluation tasks.      General Comments      Exercises     Assessment/Plan    PT Assessment Patient needs continued PT services  PT Problem List Decreased strength;Decreased mobility;Decreased safety awareness;Decreased range of motion;Decreased activity tolerance;Decreased balance;Decreased knowledge of use of DME       PT Treatment Interventions DME instruction;Gait training;Stair training;Functional mobility training;Therapeutic activities;Therapeutic exercise;Balance training;Patient/family education    PT Goals (Current goals can be found in the Care Plan section)  Acute Rehab PT Goals Patient  Stated Goal: return home PT Goal Formulation: With patient/family Time For Goal Achievement: 05/07/21 Potential to Achieve Goals: Good    Frequency Min 3X/week   Barriers to discharge        Co-evaluation PT/OT/SLP Co-Evaluation/Treatment: Yes Reason for Co-Treatment: To address functional/ADL transfers PT goals addressed during session: Mobility/safety with mobility;Balance OT goals addressed during session: ADL's and self-care       AM-PAC PT "6 Clicks" Mobility  Outcome Measure Help needed turning from your back to your side while in a flat bed without using bedrails?: A Little Help needed moving from lying on your back to sitting on the side of a flat bed without using bedrails?: A Little Help needed moving to and from a bed to a chair (including a wheelchair)?: A Little Help needed standing up from a chair using your arms (e.g., wheelchair or bedside chair)?: A Little Help needed to walk in hospital room?: A Little Help needed climbing 3-5 steps with a railing? : A Little 6 Click Score: 18    End of Session   Activity Tolerance: Patient tolerated treatment well Patient left: in bed;with call bell/phone within reach;with family/visitor present Nurse Communication: Mobility status PT Visit Diagnosis: Unsteadiness on feet (R26.81);Other abnormalities of gait and mobility (R26.89);Muscle weakness (generalized) (M62.81)    Time: 3382-5053 PT Time Calculation (min) (ACUTE ONLY): 23 min   Charges:   PT Evaluation $PT Eval Moderate Complexity: 1 Mod PT Treatments $Therapeutic Activity: 23-37 mins        Cassie Jones, SPT  During this treatment session, the therapist was  present, participating in and directing the treatment.  11:52 AM, 04/30/21 Lonell Grandchild, MPT Physical Therapist with [facility] [phone] office [phone] mobile phone

## 2021-04-30 NOTE — Progress Notes (Signed)
ANTICOAGULATION CONSULT NOTE -   Pharmacy Consult for Warfarin (home med) Indication: atrial fibrillation  Allergies  Allergen Reactions   Baclofen     Feels crazy   Niaspan [Niacin]    Flexeril [Cyclobenzaprine]     Feels crazy    Patient Measurements: Height: 5\' 4"  (162.6 cm) Weight: 72.3 kg (159 lb 6.3 oz) IBW/kg (Calculated) : 54.7  Vital Signs: Temp: 97.5 F (36.4 C) (10/14 0700) Temp Source: Oral (10/14 0700) BP: 106/74 (10/13 2200) Pulse Rate: 105 (10/13 2200)  Labs: Recent Labs    04/28/21 0418 04/29/21 0436 04/30/21 0450  HGB 8.6* 9.3* 9.3*  HCT 28.8* 30.6* 30.2*  PLT 243 226 212  LABPROT 26.3* 33.0* 34.0*  INR 2.4* 3.2* 3.4*  CREATININE 2.10* 1.94* 1.74*  1.81*     Estimated Creatinine Clearance: 25 mL/min (A) (by C-G formula based on SCr of 1.81 mg/dL (H)).   Medical History: Past Medical History:  Diagnosis Date   Arthritis    COPD (chronic obstructive pulmonary disease) ([place])    spirometry 04/20/10 FEV1 1.38 (58%) with ration 70   Depression    Diastolic dysfunction    noted on echo w/some apparent volume issues in past but not severe    HLD (hyperlipidemia)    HOH (hard of hearing)    HTN (hypertension)    mild; pulmonary   Permanent atrial fibrillation ([facility])    Ruptured disk    Tobacco abuse    Uterine cancer (HCC)     Medications:  Medications Prior to Admission  Medication Sig Dispense Refill Last Dose   acetaminophen (TYLENOL) 325 MG tablet Take 650 mg by mouth every 6 (six) hours as needed for moderate pain.   Past Week   albuterol (PROVENTIL) (2.5 MG/3ML) 0.083% nebulizer solution INHALE CONTENTS OF 1 VIAL IN NEBULIZER EVERY 6 HOURS AS NEEDED FOR FOR WHEEZING OR SHORTNESS OF BREATH. (Patient taking differently: Take 2.5 mg by nebulization every 6 (six) hours as needed for shortness of breath.) 90 mL 5 Past Week   albuterol (VENTOLIN HFA) 108 (90 Base) MCG/ACT inhaler INHALE 1-2 PUFFS INTO THE LUNGS EVERY 4 TO 6 HOURS AS NEEDED  FOR SHORTNESS OF BREATH  (NEEDS TO BE SEEN BEFORE NEXT REFILL) 8.5 g 3 Past Week   amLODipine (NORVASC) 5 MG tablet TAKE ONE TABLET BY MOUTH ONCE DAILY (MORNING) 30 tablet 5 Past Week   atorvastatin (LIPITOR) 80 MG tablet TAKE ONE TABLET BY MOUTH DAILY AT 6 PM 30 tablet 0 Past Week   azithromycin (ZITHROMAX) 250 MG tablet Take 500 mg once, then 250 mg for four days 6 tablet 0 Past Week   busPIRone (BUSPAR) 5 MG tablet Take 1 tablet (5 mg total) by mouth 3 (three) times daily as needed. 90 tablet 2 Past Week   diazepam (VALIUM) 5 MG tablet Take 5 mg by mouth every 8 (eight) hours as needed for anxiety.   unknown   diclofenac sodium (VOLTAREN) 1 % GEL Apply 2 g topically 4 (four) times daily. 350 g 3 unknown   diltiazem (CARDIZEM) 30 MG tablet TAKE ONE TABLET BY MOUTH EVERY MORNING AND IN THE EVENING (MORNING,EVENING) 60 tablet 0 Past Week   DULoxetine (CYMBALTA) 60 MG capsule TAKE TWO (2) CAPSULES BY MOUTH DAILY (EVENING) (Patient taking differently: Take 60 mg by mouth See admin instructions. Take 120 mg by mouth every evening) 60 capsule 5 Past Week   esomeprazole (NEXIUM) 40 MG capsule TAKE ONE CAPSULE BY MOUTH ONCE DAILY (MORNING PER PT) 30  capsule 5 Past Week   fluticasone (FLONASE) 50 MCG/ACT nasal spray USE ONE SPRAY IN EACH NOSTRIL ONCE DAILY. SHAKE GENTLY BEFORE USING. (Patient taking differently: Place 1 spray into both nostrils daily. SHAKE GENTLY BEFORE USING.) 16 g 2 unknown   Fluticasone-Umeclidin-Vilant (TRELEGY ELLIPTA) 100-62.5-25 MCG/INH AEPB Inhale 1 puff into the lungs daily. 3 each 4 Past Week   guaiFENesin (MUCINEX) 600 MG 12 hr tablet Take 1 tablet (600 mg total) by mouth 2 (two) times daily as needed. 60 tablet 1 Past Week   Magnesium Hydroxide (DULCOLAX PO) Take 25 mg by mouth daily.   unknown   Multiple Vitamins-Minerals (CENTRUM SILVER PO) Take 1 tablet by mouth daily.   Past Week   NON FORMULARY Inhale 5 L into the lungs daily. Oxygen 5 to 6 L every day   Past Week    nystatin cream (MYCOSTATIN) Apply 1 application topically 2 (two) times daily. 30 g 6 unknown   oxyCODONE-acetaminophen (PERCOCET) 5-325 MG per tablet Take 2 tablets by mouth every 8 (eight) hours as needed.   Past Week   potassium chloride (KLOR-CON) 10 MEQ tablet TAKE ONE TABLET BY MOUTH EVERY MORNING 30 tablet 0 Past Week   predniSONE (STERAPRED UNI-PAK 21 TAB) 10 MG (21) TBPK tablet Use as directed 21 tablet 0 Past Week   torsemide (DEMADEX) 20 MG tablet TAKE 1 AND 1/2 TABLETS TO 2 TABLETS BY MOUTH EVERY MORNING ,1& ,1/2 IN PACK EXTRA IN BOTTLE IF NEEDED 45 tablet 0 Past Week   vitamin B-12 (CYANOCOBALAMIN) 1000 MCG tablet TAKE ONE TABLET BY MOUTH DAILY WITH BREAKFAST (MORNING) (Patient taking differently: Take 1,000 mcg by mouth daily.) 30 tablet 5 Past Week   Vitamin D, Ergocalciferol, (DRISDOL) 1.25 MG (50000 UNIT) CAPS capsule Take 1 capsule (50,000 Units total) by mouth every 7 (seven) days. 12 capsule 3 Past Week   Spacer/Aero-Holding Chambers DEVI 1 Device by Does not apply route in the morning and at bedtime. 1 each 1    warfarin (COUMADIN) 5 MG tablet TAKE 1 OR 2 TABLETS BY MOUTH AS DIRECTED BY WARFARIN CLINIC (,1-SU,M,W,TH,F,SA/ ,1 &,1/2-TU) 180 tablet 0 04/24/2021 at 0800    Assessment: 80 y.o. female with a history of chronic respiratory failure on 4 to 5 L home oxygen, COPD with pulmonary hypertension, chronic diastolic heart failure, hypertension, permanent A. fib on Coumadin for anticoagulation, essential hypertension, depression/anxiety. INR SUPRAtherapeutic on admission.  (Likely elevated d/t antibiotics)  INR 3.4 today, supratherapeutic. Hgb 8.9>9.3 plt 206>226  Goal of Therapy:  INR 2-3 Monitor platelets by anticoagulation protocol: Yes   Plan:  Hold warfarin tonight  Monitor INR daily Monitor for S/S of bleeding  Isac Sarna, BS [place], BCPS Clinical Pharmacist Pager [phone] 04/30/2021,8:27 AM

## 2021-04-30 NOTE — Evaluation (Signed)
Occupational Therapy Evaluation Patient Details Name: Melinda Collins MRN: [id number] DOB: March 25, 1941 Today's Date: 04/30/2021   History of Present Illness Melinda Collins is a 80 y.o. female with a history of chronic respiratory failure on 4 to 5 L home oxygen, COPD with pulmonary hypertension, chronic diastolic heart failure, hypertension, permanent A. fib on Coumadin for anticoagulation, essential hypertension, depression/anxiety.  Patient seen for worsening shortness of breath that started approximately a week ago.  Patient's symptoms have been worsening with increasing difficulty with exertion.  She does have cough with purulent sputum production.  No fevers or chills.  She had a virtual appointment with her PCP, who prescribed prednisone and azithromycin.  Despite this, her symptoms have been worsening.  No other palliating or provoking factors.  She has been compliant with her inhalers and has been using her albuterol inhaler pretty regularly with minimal improvement.  She does report increasing palpitations and feel like her heart beat is beating really fast.  She has been compliant with her Cardizem.   Clinical Impression   Pt agreeable to OT/PT co-evaluation. Pt able to complete bed mobility with modified independence. Pt able to complete transfer from toilet to bed with Min G to min A with unsteady gait not using RW. Pt likely at level of SPV for lower body dressing. Pt demonstrates limited A/ROM to ~90* for shoulder flexion but [facility] P/ROM. Pt left in bed with family present. Pt will benefit from continued OT in the hospital and recommended venue below to increase strength, balance, and endurance for safe ADL's.        Recommendations for follow up therapy are one component of a multi-disciplinary discharge planning process, led by the attending physician.  Recommendations may be updated based on patient status, additional functional criteria and insurance authorization.   Follow Up  Recommendations  Home health OT;Supervision - Intermittent (Supervision for mobility)    Equipment Recommendations  None recommended by OT           Precautions / Restrictions Precautions Precautions: Fall Restrictions Weight Bearing Restrictions: No      Mobility Bed Mobility Overal bed mobility: Needs Assistance Bed Mobility: Supine to Sit;Sit to Supine     Supine to sit: Modified independent (Device/Increase time) Sit to supine: Modified independent (Device/Increase time)   General bed mobility comments: mild labored movement    Transfers Overall transfer level: Needs assistance Equipment used: None Transfers: Sit to/from [facility] Sit to Stand: Min guard;Min assist Stand pivot transfers: Min assist;Min guard       General transfer comment: unsteady and mildly impulsive    Balance Overall balance assessment: Needs assistance Sitting-balance support: No upper extremity supported;Feet supported Sitting balance-Leahy Scale: Good Sitting balance - Comments: seated EOB   Standing balance support: No upper extremity supported;During functional activity Standing balance-Leahy Scale: Fair Standing balance comment: without RW                           ADL either performed or assessed with clinical judgement   ADL Overall ADL's : Needs assistance/impaired                     Lower Body Dressing: Supervision/safety;Sitting/lateral leans Lower Body Dressing Details (indicate cue type and reason): Per clinical judgement. Pt was reluctant to don socks and was assisted but likely is at the above level when willing. Toilet Transfer: Min Research officer, political party Details (indicate cue type and reason):  Unsteady chair to bed transfer.                 Vision Baseline Vision/History: 1 Wears glasses Ability to See in Adequate Light: 0 Adequate Patient Visual Report: No change from baseline Vision  Assessment?: No apparent visual deficits (per observation during transfers and bed mobility)     Perception     Praxis      Pertinent Vitals/Pain Pain Assessment: No/denies pain     Hand Dominance Right   Extremity/Trunk Assessment Upper Extremity Assessment Upper Extremity Assessment: RUE deficits/detail;LUE deficits/detail RUE Deficits / Details: ~90* A/ROM shoulder flexion. WFL P/ROM. Generally weak otherwise. LUE Deficits / Details: ~90* A/ROM shoulder flexion. WFL P/ROM. Generally weak otherwise.   Lower Extremity Assessment Lower Extremity Assessment: Defer to PT evaluation   Cervical / Trunk Assessment Cervical / Trunk Assessment: Normal   Communication Communication Communication: HOH   Cognition Arousal/Alertness: Awake/alert Behavior During Therapy: WFL for tasks assessed/performed Overall Cognitive Status: Within Functional Limits for tasks assessed                                 General Comments: Pt oriented by at times reluctant to participate in evaluation tasks.   General Comments       Exercises     Shoulder Instructions      Home Living Family/patient expects to be discharged to:: Private residence Living Arrangements: Alone Available Help at Discharge: Family;Available PRN/intermittently (Grandson reports family would be able to provide assist level modeled this date.) Type of Home: Mobile home Home Access: Ramped entrance     Home Layout: One level     Bathroom Shower/Tub: Teacher, early years/pre: Standard Bathroom Accessibility: Yes   Home Equipment: Environmental consultant - 2 wheels;Shower seat;Wheelchair - manual;Bedside commode          Prior Functioning/Environment Level of Independence: Needs assistance  Gait / Transfers Assistance Needed: Household ambulator with RW PRN ADL's / Homemaking Assistance Needed: assisted by family for ADLs and IADLs            OT Problem List: Decreased strength;Decreased range of  motion;Decreased activity tolerance;Impaired balance (sitting and/or standing)      OT Treatment/Interventions: Self-care/ADL training;Therapeutic exercise;Therapeutic activities;Patient/family education;Balance training;DME and/or AE instruction    OT Goals(Current goals can be found in the care plan section) Acute Rehab OT Goals Patient Stated Goal: return home OT Goal Formulation: With patient Time For Goal Achievement: 05/14/21 Potential to Achieve Goals: Good  OT Frequency: Min 2X/week   Barriers to D/C:            Co-evaluation PT/OT/SLP Co-Evaluation/Treatment: Yes Reason for Co-Treatment: To address functional/ADL transfers   OT goals addressed during session: ADL's and self-care                       End of Session Equipment Utilized During Treatment: Oxygen (3L)  Activity Tolerance: Patient tolerated treatment well Patient left: in bed;with call bell/phone within reach;with family/visitor present  OT Visit Diagnosis: Muscle weakness (generalized) (M62.81);Unsteadiness on feet (R26.81);Other abnormalities of gait and mobility (R26.89)                Time: 5621-3086 OT Time Calculation (min): 18 min Charges:  OT General Charges $OT Visit: 1 Visit OT Evaluation $OT Eval Low Complexity: 1 Low  Frantz Quattrone OT, MOT  Larey Seat 04/30/2021, 9:24 AM

## 2021-04-30 NOTE — Plan of Care (Addendum)
  Problem: Acute Rehab PT Goals(only PT should resolve) Goal: Pt will Roll Supine to Side Outcome: Progressing Flowsheets (Taken 04/30/2021 1129) Pt will Roll Supine to Side: Independently Goal: Pt Will Go Supine/Side To Sit Outcome: Progressing Flowsheets (Taken 04/30/2021 1129) Pt will go Supine/Side to Sit: Independently Goal: Pt Will Go Sit To Supine/Side Outcome: Progressing Flowsheets (Taken 04/30/2021 1129) Pt will go Sit to Supine/Side: Independently Goal: Patient Will Perform Sitting Balance Outcome: Progressing Flowsheets (Taken 04/30/2021 1129) Patient will perform sitting balance: Independently Goal: Patient Will Transfer Sit To/From Stand Outcome: Progressing Flowsheets (Taken 04/30/2021 1129) Patient will transfer sit to/from stand: with supervision Goal: Pt Will Transfer Bed To Chair/Chair To Bed Outcome: Progressing Flowsheets (Taken 04/30/2021 1129) Pt will Transfer Bed to Chair/Chair to Bed: with supervision Goal: Pt Will Perform Standing Balance Or Pre-Gait Outcome: Progressing Flowsheets (Taken 04/30/2021 1129) Pt will perform standing balance or pre-gait: with Modified Independent Goal: Pt Will Ambulate Outcome: Progressing Flowsheets (Taken 04/30/2021 1129) Pt will Ambulate: 25 feet   Cassie Jones, SPT  During this treatment session, the therapist was present, participating in and directing the treatment.  11:56 AM, 04/30/21 Lonell Grandchild, MPT Physical Therapist with [facility] [phone] office [phone] mobile phone

## 2021-05-01 DIAGNOSIS — I4891 Unspecified atrial fibrillation: Secondary | ICD-10-CM | POA: Diagnosis not present

## 2021-05-01 DIAGNOSIS — J9620 Acute and chronic respiratory failure, unspecified whether with hypoxia or hypercapnia: Secondary | ICD-10-CM | POA: Diagnosis not present

## 2021-05-01 DIAGNOSIS — F411 Generalized anxiety disorder: Secondary | ICD-10-CM | POA: Diagnosis not present

## 2021-05-01 DIAGNOSIS — J441 Chronic obstructive pulmonary disease with (acute) exacerbation: Secondary | ICD-10-CM | POA: Diagnosis not present

## 2021-05-01 LAB — CBC
HCT: 29.8 % — ABNORMAL LOW (ref 36.0–46.0)
Hemoglobin: 9 g/dL — ABNORMAL LOW (ref 12.0–15.0)
MCH: 28.8 pg (ref 26.0–34.0)
MCHC: 30.2 g/dL (ref 30.0–36.0)
MCV: 95.2 fL (ref 80.0–100.0)
Platelets: 221 10*3/uL (ref 150–400)
RBC: 3.13 MIL/uL — ABNORMAL LOW (ref 3.87–5.11)
RDW: 16.9 % — ABNORMAL HIGH (ref 11.5–15.5)
WBC: 13.2 10*3/uL — ABNORMAL HIGH (ref 4.0–10.5)
nRBC: 0 % (ref 0.0–0.2)

## 2021-05-01 LAB — RENAL FUNCTION PANEL
Albumin: 3.3 g/dL — ABNORMAL LOW (ref 3.5–5.0)
Anion gap: 9 (ref 5–15)
BUN: 52 mg/dL — ABNORMAL HIGH (ref 8–23)
CO2: 30 mmol/L (ref 22–32)
Calcium: 8.2 mg/dL — ABNORMAL LOW (ref 8.9–10.3)
Chloride: 100 mmol/L (ref 98–111)
Creatinine, Ser: 1.76 mg/dL — ABNORMAL HIGH (ref 0.44–1.00)
GFR, Estimated: 29 mL/min — ABNORMAL LOW (ref >60)
Glucose, Bld: 110 mg/dL — ABNORMAL HIGH (ref 70–99)
Phosphorus: 3.9 mg/dL (ref 2.5–4.6)
Potassium: 4.3 mmol/L (ref 3.5–5.1)
Sodium: 139 mmol/L (ref 135–145)

## 2021-05-01 LAB — BRAIN NATRIURETIC PEPTIDE: B Natriuretic Peptide: 527 pg/mL — ABNORMAL HIGH (ref 0.0–100.0)

## 2021-05-01 LAB — PROTIME-INR
INR: 2.5 — ABNORMAL HIGH (ref 0.8–1.2)
Prothrombin Time: 26.8 seconds — ABNORMAL HIGH (ref 11.4–15.2)

## 2021-05-01 MED ORDER — WARFARIN SODIUM 5 MG PO TABS
5.0000 mg | ORAL_TABLET | Freq: Once | ORAL | Status: AC
  Administered 2021-05-01: 5 mg via ORAL
  Filled 2021-05-01: qty 1

## 2021-05-01 MED ORDER — HALOPERIDOL LACTATE 5 MG/ML IJ SOLN: 1.0000 mg | Freq: Two times a day (BID) | INTRAMUSCULAR | Status: DC | PRN

## 2021-05-01 MED ORDER — GUAIFENESIN-DM 100-10 MG/5ML PO SYRP: 5.0000 mL | ORAL_SOLUTION | ORAL | Status: DC | PRN

## 2021-05-01 MED ORDER — LEVALBUTEROL HCL 0.63 MG/3ML IN NEBU
0.6300 mg | INHALATION_SOLUTION | Freq: Three times a day (TID) | RESPIRATORY_TRACT | Status: DC
  Administered 2021-05-01 (×2): 0.63 mg via RESPIRATORY_TRACT
  Filled 2021-05-01 (×2): qty 3

## 2021-05-01 MED ORDER — DIAZEPAM 5 MG PO TABS
2.5000 mg | ORAL_TABLET | Freq: Every day | ORAL | Status: DC
  Administered 2021-05-02: 2.5 mg via ORAL
  Filled 2021-05-01 (×2): qty 1

## 2021-05-01 MED ORDER — IPRATROPIUM-ALBUTEROL 0.5-2.5 (3) MG/3ML IN SOLN: 3.0000 mL | Freq: Three times a day (TID) | RESPIRATORY_TRACT | Status: DC

## 2021-05-01 MED ORDER — GUAIFENESIN 100 MG/5ML PO SOLN
5.0000 mL | ORAL | Status: DC | PRN
  Administered 2021-05-01 – 2021-05-02 (×7): 100 mg via ORAL
  Filled 2021-05-01 (×8): qty 5

## 2021-05-01 NOTE — Progress Notes (Addendum)
PROGRESS NOTE    Melinda Collins  DXA:[id number] DOB: 17-May-1941 DOA: 04/25/2021 PCP: [name], FNP    Brief Narrative:  Mrs. Sill was admitted to the hospital with the working diagnosis of atrial fibrillation with rapid ventricular response.  Hospitalization complicated by COPD exacerbation and metabolic encephalopathy with delirium.   80 year old female with past medical history for COPD, chronic hypoxic respiratory failure (4 to 5 L of oxygen per nasal cannula at home), pulmonary hypertension, diastolic heart failure, hypertension, and atrial fibrillation who presented with dyspnea.  Reported worsening symptoms over the last 7 days, decreased physical functional capacity, and positive productive cough.  Her symptoms were refractory to outpatient treatment with prednisone and azithromycin.  On her initial physical examination her blood pressure was 99/86, heart rate 93, temperature 97.6, respiratory 27, oxygen saturation 93%.  Her lungs had decreased breath sounds bilaterally with coarse wheezing, heart S1-S2 present, irregularly irregular, soft abdomen, no lower extremity edema.  Sodium 133, potassium 5.7, chloride 99, bicarb 26, glucose 115, BUN 37, creatinine 1.96, high-sensitivity troponin 12-12, white count 11.8, hemoglobin 9.0, hematocrit 39.3, platelets 252. SARS COVID-19 negative.  Chest radiograph with cardiomegaly positive hilar vascular congestion, bilateral lower lobe interstitial infiltrate, small left pleural effusion.   EKG 124 bpm left axis deviation, right bundle branch block, atrial fibrillation rhythm, poor R wave progression, no significant ST segment or T wave changes.  Patient was placed on intravenous diltiazem for rate control. Anticoagulation with warfarin. Eventually transitioned to oral diltiazem and metoprolol. For acute on chronic diastolic heart failure exacerbation received IV diuresis with with clinical improvement.  She was diagnosed with  COPD exacerbation, received bronchodilator therapy and systemic corticosteroids. Unfortunately developed steroid-induced psychosis.  Prolonged hospitalization, slowly improving.   Assessment & Plan:   Principal Problem:   Acute on chronic respiratory failure (HCC) Active Problems:   Pulmonary hypertension (HCC)   Atrial fibrillation with RVR (HCC)   Permanent atrial fibrillation (HCC)   Pulmonary emphysema (HCC)   GAD (generalized anxiety disorder)   COPD with acute exacerbation (HCC)   Hyperkalemia   Chronic atrial fibrillation with acute RVR, complicated with acute on chronic diastolic heart failure exacerbation.  NEW Hypotension.  Patient with radiographic improvement of acute cardiogenic pulmonary edema, chest radiograph from 10/14 personally reviewed. Last night did not require Bipap.  Her oxygenation this am is 100% on 5 L/min per [place]. She continue to have cough.  Echocardiogram with EF 55 to 20%, RV systolic function preserved. RSVP 59.6 mmHg, pulmonary HTN. Biatrial enlargement, moderate mitral valve regurgitation and moderate aortic stenosis.  Positive atrial level shunting detected by color flow doppler.  Blood pressure today has been low with systolic 58 to 90 mmHg.   Plan. Continue reate control with diltiazem and metoprolol. Hold on furosemide for now due to hypotension.  Anticoagulation with warfarin, INR is 2,5 today.   2. COPD exacerbation/ community acquired pneumonia. Patient with persistent cough but improved dyspnea.  Discontinue systemic corticosteroids due to psychosis, steroid induced.  Continue antitussive agents, airway clearing techniques and bronchodilator therapy/ inhaled corticosteroids.  Complete antibiotic therapy with doxycycline. (Short course 5 days)  3. AKI on CKD stage IV, hyperkalemia Renal function with serum cr at 1,76 with K at 4,3 and serum bicarbonate at 30. Hold on diuresis to hypotension.  Follow up renal function in am, avoid  hypotension and nephrotoxic medications.   4. Acute metabolic encephalopathy with delirium, depression. Her mentation has improved today, she was able to sleep last night. Tolerating well  diazepam, and used only once haldol.   Plan to decrease dose of haldol to 1 mg as needed.  Discontinue lorazepam but will use diazepm for sleep at night, last night received 79 mg, will do 79,5 mg tonight and continue close monitoring.   At home patient use wheelchair when going out of her home, will request Pt and Ot evaluation in preparation for possible dc in the next 48 hrs,.   5. Dyslipidemia. Continue with statin therapy.   Continue with duloxetine.    Patient continue to be at high risk for worsening hypotension, atrial fibrillation and respiratory failure.   Status is: Inpatient  Remains inpatient appropriate because: continue close cardiopulmonary monitoring    DVT prophylaxis: Warfarin   Code Status:    DNR   Family Communication:  I spoke with patient's daughter in law and husband at the bedside, we talked in detail about patient's condition, plan of care and prognosis and all questions were addressed.    Antimicrobials:  Doxycycline     Subjective: Patient with improved mentation this am, had diazepam last night, this am more awake and alert, dyspnea has been improving, but continue to have persistent cough.   Objective: Vitals:   05/01/21 0000 05/01/21 0317 05/01/21 0400 05/01/21 0802  BP: 94/69  (!) 69/49   Pulse: 96  89   Resp: 12  (!) 22   Temp:      TempSrc:      SpO2: 98% 93% 99% 100%  Weight:      Height:        Intake/Output Summary (Last 24 hours) at 05/01/2021 0817 Last data filed at 04/30/2021 1850 Gross per 24 hour  Intake --  Output 450 ml  Net -450 ml   Filed Weights   04/25/21 1513 04/25/21 2258 04/30/21 1600  Weight: 72.6 kg 72.3 kg 71.2 kg    Examination:   General: Not in pain or dyspnea, deconditioned and ill looking appearing  Neurology:  Awake and alert, non focal  E ENT: positive pallor, no icterus, oral mucosa moist Cardiovascular: No JVD. S1-S2 present, irregularly irregular , no gallops, rubs, or murmurs. No lower extremity edema. Pulmonary: positive breath sounds bilaterally,  with expiratory wheezing,and scattered rhonchi and rales. Gastrointestinal. Abdomen soft ant non tender Skin. No rashes Musculoskeletal: no joint deformities     Data Reviewed: I have personally reviewed following labs and imaging studies  CBC: Recent Labs  Lab 04/26/21 0416 04/28/21 0418 04/29/21 0436 04/30/21 0450 05/01/21 0342  WBC 7.2 13.1* 10.9* 14.1* 13.2*  HGB 8.9* 8.6* 9.3* 9.3* 9.0*  HCT 30.0* 28.8* 30.6* 30.2* 29.8*  MCV 96.8 96.3 96.5 94.7 95.2  PLT 206 243 226 212 938   Basic Metabolic Panel: Recent Labs  Lab 04/27/21 0421 04/28/21 0418 04/29/21 0436 04/30/21 0450 05/01/21 0342  NA 137 140 140 138  140 139  K 4.4 4.6 4.2 4.1  4.2 4.3  CL 99 102 99 100  101 100  CO2 29 26 31 29  31 30   GLUCOSE 200* 110* 104* 92  92 110*  BUN 56* 65* 60* 54*  54* 52*  CREATININE 2.17* 2.10* 1.94* 1.74*  1.81* 1.76*  CALCIUM 8.4* 8.0* 8.0* 7.9*  8.2* 8.2*  PHOS  --  6.4*  --  4.6 3.9   GFR: Estimated Creatinine Clearance: 25.5 mL/min (A) (by C-G formula based on SCr of 1.76 mg/dL (H)). Liver Function Tests: Recent Labs  Lab 04/28/21 0418 04/30/21 0450 05/01/21 0342  ALBUMIN 3.5  3.4* 3.3*   No results for input(s): LIPASE, AMYLASE in the last 168 hours. No results for input(s): AMMONIA in the last 168 hours. Coagulation Profile: Recent Labs  Lab 04/27/21 0421 04/28/21 0418 04/29/21 0436 04/30/21 0450 05/01/21 0342  INR 2.8* 2.4* 3.2* 3.4* 2.5*   Cardiac Enzymes: No results for input(s): CKTOTAL, CKMB, CKMBINDEX, TROPONINI in the last 168 hours. BNP (last 3 results) No results for input(s): PROBNP in the last 8760 hours. HbA1C: No results for input(s): HGBA1C in the last 72 hours. CBG: No results for  input(s): GLUCAP in the last 168 hours. Lipid Profile: No results for input(s): CHOL, HDL, LDLCALC, TRIG, CHOLHDL, LDLDIRECT in the last 72 hours. Thyroid Function Tests: No results for input(s): TSH, T4TOTAL, FREET4, T3FREE, THYROIDAB in the last 72 hours. Anemia Panel: No results for input(s): VITAMINB12, FOLATE, FERRITIN, TIBC, IRON, RETICCTPCT in the last 72 hours.    Radiology Studies: I have reviewed all of the imaging during this hospital visit personally     Scheduled Meds:  aspirin EC  81 mg Oral Daily   atorvastatin  80 mg Oral q1800   Chlorhexidine Gluconate Cloth  6 each Topical Q0600   diltiazem  180 mg Oral Daily   doxycycline  100 mg Oral Q12H   DULoxetine  120 mg Oral q1800   fluticasone furoate-vilanterol  1 puff Inhalation Daily   And   umeclidinium bromide  1 puff Inhalation Daily   furosemide  60 mg Intravenous BID   guaiFENesin  600 mg Oral BID   ipratropium  0.5 mg Nebulization TID   metoprolol succinate  50 mg Oral QPM   pantoprazole  40 mg Oral Daily   polyethylene glycol  17 g Oral BID   tamsulosin  0.4 mg Oral Daily   Warfarin - Pharmacist Dosing Inpatient   Does not apply q1600   Continuous Infusions:   LOS: 6 days        Roselyne Stalnaker Gerome Apley, MD

## 2021-05-01 NOTE — Progress Notes (Signed)
Bipap is currently not needed at this time but still at bedside if need should arise.

## 2021-05-01 NOTE — Progress Notes (Signed)
ANTICOAGULATION CONSULT NOTE -   Pharmacy Consult for Warfarin (home med) Indication: atrial fibrillation  Allergies  Allergen Reactions   Baclofen     Feels crazy   Niaspan [Niacin]    Flexeril [Cyclobenzaprine]     Feels crazy    Patient Measurements: Height: 5\' 4"  (162.6 cm) Weight: 71.2 kg (157 lb) IBW/kg (Calculated) : 54.7  Vital Signs: Temp: 98.4 F (36.9 C) (10/15 0847) Temp Source: Oral (10/15 0847) BP: 69/49 (10/15 0400) Pulse Rate: 89 (10/15 0400)  Labs: Recent Labs    04/29/21 0436 04/30/21 0450 05/01/21 0342  HGB 9.3* 9.3* 9.0*  HCT 30.6* 30.2* 29.8*  PLT 226 212 221  LABPROT 33.0* 34.0* 26.8*  INR 3.2* 3.4* 2.5*  CREATININE 1.94* 1.74*  1.81* 1.76*     Estimated Creatinine Clearance: 25.5 mL/min (A) (by C-G formula based on SCr of 1.76 mg/dL (H)).   Medical History: Past Medical History:  Diagnosis Date   Arthritis    COPD (chronic obstructive pulmonary disease) ([place])    spirometry 04/20/10 FEV1 1.38 (58%) with ration 70   Depression    Diastolic dysfunction    noted on echo w/some apparent volume issues in past but not severe    HLD (hyperlipidemia)    HOH (hard of hearing)    HTN (hypertension)    mild; pulmonary   Permanent atrial fibrillation (HCC)    Ruptured disk    Tobacco abuse    Uterine cancer (HCC)     Medications:  Medications Prior to Admission  Medication Sig Dispense Refill Last Dose   acetaminophen (TYLENOL) 325 MG tablet Take 650 mg by mouth every 6 (six) hours as needed for moderate pain.   Past Week   albuterol (PROVENTIL) (2.5 MG/3ML) 0.083% nebulizer solution INHALE CONTENTS OF 1 VIAL IN NEBULIZER EVERY 6 HOURS AS NEEDED FOR FOR WHEEZING OR SHORTNESS OF BREATH. (Patient taking differently: Take 2.5 mg by nebulization every 6 (six) hours as needed for shortness of breath.) 90 mL 5 Past Week   albuterol (VENTOLIN HFA) 108 (90 Base) MCG/ACT inhaler INHALE 1-2 PUFFS INTO THE LUNGS EVERY 4 TO 6 HOURS AS NEEDED FOR  SHORTNESS OF BREATH  (NEEDS TO BE SEEN BEFORE NEXT REFILL) 8.5 g 3 Past Week   amLODipine (NORVASC) 5 MG tablet TAKE ONE TABLET BY MOUTH ONCE DAILY (MORNING) 30 tablet 5 Past Week   atorvastatin (LIPITOR) 80 MG tablet TAKE ONE TABLET BY MOUTH DAILY AT 6 PM 30 tablet 0 Past Week   azithromycin (ZITHROMAX) 250 MG tablet Take 500 mg once, then 250 mg for four days 6 tablet 0 Past Week   busPIRone (BUSPAR) 5 MG tablet Take 1 tablet (5 mg total) by mouth 3 (three) times daily as needed. 90 tablet 2 Past Week   diazepam (VALIUM) 5 MG tablet Take 5 mg by mouth every 8 (eight) hours as needed for anxiety.   unknown   diclofenac sodium (VOLTAREN) 1 % GEL Apply 2 g topically 4 (four) times daily. 350 g 3 unknown   diltiazem (CARDIZEM) 30 MG tablet TAKE ONE TABLET BY MOUTH EVERY MORNING AND IN THE EVENING (MORNING,EVENING) 60 tablet 0 Past Week   DULoxetine (CYMBALTA) 60 MG capsule TAKE TWO (2) CAPSULES BY MOUTH DAILY (EVENING) (Patient taking differently: Take 60 mg by mouth See admin instructions. Take 120 mg by mouth every evening) 60 capsule 5 Past Week   esomeprazole (NEXIUM) 40 MG capsule TAKE ONE CAPSULE BY MOUTH ONCE DAILY (MORNING PER PT) 30 capsule 5  Past Week   fluticasone (FLONASE) 50 MCG/ACT nasal spray USE ONE SPRAY IN EACH NOSTRIL ONCE DAILY. SHAKE GENTLY BEFORE USING. (Patient taking differently: Place 1 spray into both nostrils daily. SHAKE GENTLY BEFORE USING.) 16 g 2 unknown   Fluticasone-Umeclidin-Vilant (TRELEGY ELLIPTA) 100-62.5-25 MCG/INH AEPB Inhale 1 puff into the lungs daily. 3 each 4 Past Week   guaiFENesin (MUCINEX) 600 MG 12 hr tablet Take 1 tablet (600 mg total) by mouth 2 (two) times daily as needed. 60 tablet 1 Past Week   Magnesium Hydroxide (DULCOLAX PO) Take 25 mg by mouth daily.   unknown   Multiple Vitamins-Minerals (CENTRUM SILVER PO) Take 1 tablet by mouth daily.   Past Week   NON FORMULARY Inhale 5 L into the lungs daily. Oxygen 5 to 6 L every day   Past Week   nystatin  cream (MYCOSTATIN) Apply 1 application topically 2 (two) times daily. 30 g 6 unknown   oxyCODONE-acetaminophen (PERCOCET) 5-325 MG per tablet Take 2 tablets by mouth every 8 (eight) hours as needed.   Past Week   potassium chloride (KLOR-CON) 10 MEQ tablet TAKE ONE TABLET BY MOUTH EVERY MORNING 30 tablet 0 Past Week   predniSONE (STERAPRED UNI-PAK 21 TAB) 10 MG (21) TBPK tablet Use as directed 21 tablet 0 Past Week   torsemide (DEMADEX) 20 MG tablet TAKE 1 AND 1/2 TABLETS TO 2 TABLETS BY MOUTH EVERY MORNING ,1& ,1/2 IN PACK EXTRA IN BOTTLE IF NEEDED 45 tablet 0 Past Week   vitamin B-12 (CYANOCOBALAMIN) 1000 MCG tablet TAKE ONE TABLET BY MOUTH DAILY WITH BREAKFAST (MORNING) (Patient taking differently: Take 1,000 mcg by mouth daily.) 30 tablet 5 Past Week   Vitamin D, Ergocalciferol, (DRISDOL) 1.25 MG (50000 UNIT) CAPS capsule Take 1 capsule (50,000 Units total) by mouth every 7 (seven) days. 12 capsule 3 Past Week   Spacer/Aero-Holding Chambers DEVI 1 Device by Does not apply route in the morning and at bedtime. 1 each 1    warfarin (COUMADIN) 5 MG tablet TAKE 1 OR 2 TABLETS BY MOUTH AS DIRECTED BY [facility] (,1-SU,M,W,TH,F,SA/ ,1 &,1/2-TU) 180 tablet 0 04/24/2021 at 0800    Assessment: 80 y.o. female with a history of chronic respiratory failure on 4 to 5 L home oxygen, COPD with pulmonary hypertension, chronic diastolic heart failure, hypertension, permanent A. fib on Coumadin for anticoagulation, essential hypertension, depression/anxiety. INR SUPRAtherapeutic on admission.  (Likely elevated d/t antibiotics)  INR 2.5 today, therapeutic. Hgb 8.9>9.3 plt 206>226  Goal of Therapy:  INR 2-3 Monitor platelets by anticoagulation protocol: Yes   Plan:  Warfarin 5mg  po x1 today Monitor INR daily Monitor for S/S of bleeding  Isac Sarna, BS [place], BCPS Clinical Pharmacist Pager [phone] 05/01/2021,9:58 AM

## 2021-05-01 NOTE — Progress Notes (Signed)
Patient not requiring BIPAP tonight. Unit still at bedside if needed.

## 2021-05-02 ENCOUNTER — Inpatient Hospital Stay (HOSPITAL_COMMUNITY): Payer: Medicare Other

## 2021-05-02 DIAGNOSIS — J9620 Acute and chronic respiratory failure, unspecified whether with hypoxia or hypercapnia: Secondary | ICD-10-CM | POA: Diagnosis not present

## 2021-05-02 LAB — BASIC METABOLIC PANEL
Anion gap: 8 (ref 5–15)
BUN: 52 mg/dL — ABNORMAL HIGH (ref 8–23)
CO2: 31 mmol/L (ref 22–32)
Calcium: 8.3 mg/dL — ABNORMAL LOW (ref 8.9–10.3)
Chloride: 99 mmol/L (ref 98–111)
Creatinine, Ser: 1.9 mg/dL — ABNORMAL HIGH (ref 0.44–1.00)
GFR, Estimated: 27 mL/min — ABNORMAL LOW (ref >60)
Glucose, Bld: 161 mg/dL — ABNORMAL HIGH (ref 70–99)
Potassium: 3.9 mmol/L (ref 3.5–5.1)
Sodium: 138 mmol/L (ref 135–145)

## 2021-05-02 LAB — PROTIME-INR
INR: 2.2 — ABNORMAL HIGH (ref 0.8–1.2)
Prothrombin Time: 24.7 seconds — ABNORMAL HIGH (ref 11.4–15.2)

## 2021-05-02 MED ORDER — TORSEMIDE 20 MG PO TABS
20.0000 mg | ORAL_TABLET | Freq: Every day | ORAL | Status: AC
  Administered 2021-05-02 – 2021-05-03 (×2): 20 mg via ORAL
  Filled 2021-05-02 (×2): qty 1

## 2021-05-02 MED ORDER — FLUTICASONE PROPIONATE 50 MCG/ACT NA SUSP
2.0000 | Freq: Every day | NASAL | Status: DC
  Administered 2021-05-02 – 2021-05-03 (×2): 2 via NASAL
  Filled 2021-05-02: qty 16

## 2021-05-02 MED ORDER — LEVALBUTEROL HCL 0.63 MG/3ML IN NEBU
0.6300 mg | INHALATION_SOLUTION | Freq: Four times a day (QID) | RESPIRATORY_TRACT | Status: DC | PRN
  Administered 2021-05-02 (×2): 0.63 mg via RESPIRATORY_TRACT
  Filled 2021-05-02 (×3): qty 3

## 2021-05-02 MED ORDER — WARFARIN SODIUM 5 MG PO TABS
5.0000 mg | ORAL_TABLET | Freq: Once | ORAL | Status: AC
  Administered 2021-05-02: 5 mg via ORAL
  Filled 2021-05-02: qty 1

## 2021-05-02 MED ORDER — ALBUTEROL SULFATE (2.5 MG/3ML) 0.083% IN NEBU
INHALATION_SOLUTION | RESPIRATORY_TRACT | Status: AC
  Administered 2021-05-02: 2.5 mg
  Filled 2021-05-02: qty 3

## 2021-05-02 MED ORDER — ACETAMINOPHEN 325 MG PO TABS
650.0000 mg | ORAL_TABLET | Freq: Four times a day (QID) | ORAL | Status: DC | PRN
  Administered 2021-05-02 – 2021-05-03 (×5): 650 mg via ORAL
  Filled 2021-05-02 (×5): qty 2

## 2021-05-02 NOTE — Progress Notes (Signed)
Patient sats were 100%, weaned patient down to 4L to see how she tolerates.

## 2021-05-02 NOTE — Progress Notes (Signed)
ANTICOAGULATION CONSULT NOTE -   Pharmacy Consult for Warfarin (home med) Indication: atrial fibrillation  Allergies  Allergen Reactions   Baclofen     Feels crazy   Niaspan [Niacin]    Flexeril [Cyclobenzaprine]     Feels crazy    Patient Measurements: Height: 5\' 4"  (162.6 cm) Weight: 71.2 kg (157 lb) IBW/kg (Calculated) : 54.7  Vital Signs: BP: 139/88 (10/16 0400)  Labs: Recent Labs    04/30/21 0450 05/01/21 0342 05/02/21 0408  HGB 9.3* 9.0*  --   HCT 30.2* 29.8*  --   PLT 212 221  --   LABPROT 34.0* 26.8* 24.7*  INR 3.4* 2.5* 2.2*  CREATININE 1.74*  1.81* 1.76* 1.90*     Estimated Creatinine Clearance: 23.6 mL/min (A) (by C-G formula based on SCr of 1.9 mg/dL (H)).   Medical History: Past Medical History:  Diagnosis Date   Arthritis    COPD (chronic obstructive pulmonary disease) ([place])    spirometry 04/20/10 FEV1 1.38 (58%) with ration 70   Depression    Diastolic dysfunction    noted on echo w/some apparent volume issues in past but not severe    HLD (hyperlipidemia)    HOH (hard of hearing)    HTN (hypertension)    mild; pulmonary   Permanent atrial fibrillation (HCC)    Ruptured disk    Tobacco abuse    Uterine cancer (HCC)     Medications:  Medications Prior to Admission  Medication Sig Dispense Refill Last Dose   acetaminophen (TYLENOL) 325 MG tablet Take 650 mg by mouth every 6 (six) hours as needed for moderate pain.   Past Week   albuterol (PROVENTIL) (2.5 MG/3ML) 0.083% nebulizer solution INHALE CONTENTS OF 1 VIAL IN NEBULIZER EVERY 6 HOURS AS NEEDED FOR FOR WHEEZING OR SHORTNESS OF BREATH. (Patient taking differently: Take 2.5 mg by nebulization every 6 (six) hours as needed for shortness of breath.) 90 mL 5 Past Week   albuterol (VENTOLIN HFA) 108 (90 Base) MCG/ACT inhaler INHALE 1-2 PUFFS INTO THE LUNGS EVERY 4 TO 6 HOURS AS NEEDED FOR SHORTNESS OF BREATH  (NEEDS TO BE SEEN BEFORE NEXT REFILL) 8.5 g 3 Past Week   amLODipine (NORVASC) 5  MG tablet TAKE ONE TABLET BY MOUTH ONCE DAILY (MORNING) 30 tablet 5 Past Week   atorvastatin (LIPITOR) 80 MG tablet TAKE ONE TABLET BY MOUTH DAILY AT 6 PM 30 tablet 0 Past Week   azithromycin (ZITHROMAX) 250 MG tablet Take 500 mg once, then 250 mg for four days 6 tablet 0 Past Week   busPIRone (BUSPAR) 5 MG tablet Take 1 tablet (5 mg total) by mouth 3 (three) times daily as needed. 90 tablet 2 Past Week   diazepam (VALIUM) 5 MG tablet Take 5 mg by mouth every 8 (eight) hours as needed for anxiety.   unknown   diclofenac sodium (VOLTAREN) 1 % GEL Apply 2 g topically 4 (four) times daily. 350 g 3 unknown   diltiazem (CARDIZEM) 30 MG tablet TAKE ONE TABLET BY MOUTH EVERY MORNING AND IN THE EVENING (MORNING,EVENING) 60 tablet 0 Past Week   DULoxetine (CYMBALTA) 60 MG capsule TAKE TWO (2) CAPSULES BY MOUTH DAILY (EVENING) (Patient taking differently: Take 60 mg by mouth See admin instructions. Take 120 mg by mouth every evening) 60 capsule 5 Past Week   esomeprazole (NEXIUM) 40 MG capsule TAKE ONE CAPSULE BY MOUTH ONCE DAILY (MORNING PER PT) 30 capsule 5 Past Week   fluticasone (FLONASE) 50 MCG/ACT nasal spray USE  ONE SPRAY IN EACH NOSTRIL ONCE DAILY. SHAKE GENTLY BEFORE USING. (Patient taking differently: Place 1 spray into both nostrils daily. SHAKE GENTLY BEFORE USING.) 16 g 2 unknown   Fluticasone-Umeclidin-Vilant (TRELEGY ELLIPTA) 100-62.5-25 MCG/INH AEPB Inhale 1 puff into the lungs daily. 3 each 4 Past Week   guaiFENesin (MUCINEX) 600 MG 12 hr tablet Take 1 tablet (600 mg total) by mouth 2 (two) times daily as needed. 60 tablet 1 Past Week   Magnesium Hydroxide (DULCOLAX PO) Take 25 mg by mouth daily.   unknown   Multiple Vitamins-Minerals (CENTRUM SILVER PO) Take 1 tablet by mouth daily.   Past Week   NON FORMULARY Inhale 5 L into the lungs daily. Oxygen 5 to 6 L every day   Past Week   nystatin cream (MYCOSTATIN) Apply 1 application topically 2 (two) times daily. 30 g 6 unknown    oxyCODONE-acetaminophen (PERCOCET) 5-325 MG per tablet Take 2 tablets by mouth every 8 (eight) hours as needed.   Past Week   potassium chloride (KLOR-CON) 10 MEQ tablet TAKE ONE TABLET BY MOUTH EVERY MORNING 30 tablet 0 Past Week   predniSONE (STERAPRED UNI-PAK 21 TAB) 10 MG (21) TBPK tablet Use as directed 21 tablet 0 Past Week   torsemide (DEMADEX) 20 MG tablet TAKE 1 AND 1/2 TABLETS TO 2 TABLETS BY MOUTH EVERY MORNING ,1& ,1/2 IN PACK EXTRA IN BOTTLE IF NEEDED 45 tablet 0 Past Week   vitamin B-12 (CYANOCOBALAMIN) 1000 MCG tablet TAKE ONE TABLET BY MOUTH DAILY WITH BREAKFAST (MORNING) (Patient taking differently: Take 1,000 mcg by mouth daily.) 30 tablet 5 Past Week   Vitamin D, Ergocalciferol, (DRISDOL) 1.25 MG (50000 UNIT) CAPS capsule Take 1 capsule (50,000 Units total) by mouth every 7 (seven) days. 12 capsule 3 Past Week   Spacer/Aero-Holding Chambers DEVI 1 Device by Does not apply route in the morning and at bedtime. 1 each 1    warfarin (COUMADIN) 5 MG tablet TAKE 1 OR 2 TABLETS BY MOUTH AS DIRECTED BY [facility]IN CLINIC (,1-SU,M,W,TH,F,SA/ ,1 &,1/2-TU) 180 tablet 0 04/24/2021 at 0800    Assessment: 80 y.o. female with a history of chronic respiratory failure on 4 to 5 L home oxygen, COPD with pulmonary hypertension, chronic diastolic heart failure, hypertension, permanent A. fib on Coumadin for anticoagulation, essential hypertension, depression/anxiety. INR SUPRAtherapeutic on admission.  (Likely elevated d/t antibiotics)  INR 2.2 today, therapeutic. Hgb 8.9>9.3 plt 206>226  Goal of Therapy:  INR 2-3 Monitor platelets by anticoagulation protocol: Yes   Plan:  Warfarin 5mg  po x1 today Monitor INR daily Monitor for S/S of bleeding  Isac Sarna, BS [place], BCPS Clinical Pharmacist Pager [phone] 05/02/2021,9:16 AM

## 2021-05-02 NOTE — Progress Notes (Signed)
Went in to get temperature and daily weight.  Patient is sleeping at this time and daughter did not want me to wake her up.

## 2021-05-02 NOTE — Progress Notes (Signed)
EKG obtained and placed in chart.

## 2021-05-02 NOTE — Progress Notes (Signed)
Bipap is PRN order. Not needed at this time, will continue to monitor. 

## 2021-05-02 NOTE — Progress Notes (Addendum)
PROGRESS NOTE  Melinda Collins OEU:[id number] DOB: [birth year] DOA: 04/25/2021 PCP: [name], FNP  HPI/Recap of past 61 hours:  80 year old female with past medical history for COPD, chronic hypoxic respiratory failure (4 to 5 L of oxygen per nasal cannula at home), pulmonary hypertension, chronic diastolic CHF, hypertension, and permanent atrial fibrillation who presented to [facility] ED with progressively worsening dyspnea x7 days.  Associated with decreased physical functional capacity, and productive cough.  Her symptoms were refractory to outpatient treatment with prednisone and azithromycin.  SARS COVID-19 negative.   On presentation, chest radiograph with cardiomegaly positive hilar vascular congestion, bilateral lower lobe interstitial infiltrate, small left pleural effusion.  Work-up revealed acute COPD exacerbation for which she received steroids and bronchodilators, acute on chronic diastolic CHF for which she received IV diuretics, she had A. fib with RVR for which she was put on diltiazem drip for rate control.  Anticoagulated with warfarin.  She was eventually transitioned to oral diltiazem and metoprolol.   She developed steroid-induced psychosis.  Prolonged hospitalization.  05/02/2021: Patient was seen and examined at her bedside.  Her daughter was present in the room.  She appears anxious.  Reports tinge of blood in her sputum after coughing.  Also reports nasal congestion, Flonase ordered.  Assessment/Plan: Principal Problem:   Acute on chronic respiratory failure (HCC) Active Problems:   Pulmonary hypertension (HCC)   Atrial fibrillation with RVR (HCC)   Permanent atrial fibrillation (HCC)   Pulmonary emphysema (HCC)   GAD (generalized anxiety disorder)   COPD with acute exacerbation (HCC)   Hyperkalemia  Chronic atrial fibrillation with acute RVR, complicated with acute on chronic diastolic heart failure exacerbation, RVR has resolved, BP is  improved. Continue rate control with Cardizem 180 mg daily and metoprolol succinate 50 mg nightly. Continue to monitor on telemetry. INR is therapeutic 2.2, appreciate pharmacy's assistance with dosing.  Acute on chronic diastolic CHF BNP elevated 527 Left pleural effusion on chest x-ray 05/02/21, personally reviewed Restarted home torsemide 20 mg daily x2 days Started strict I's and O's and daily weight Closely monitor renal function and electrolytes while on diuretics  Left pleural effusion, suspect cardiogenic Repeat chest x-ray done on 05/02/2021, personally reviewed, showing left pleural effusion Restarted home torsemide Continue incentive spirometer and flutter valve. Mobilize as tolerated. Continue to closely monitor.  COPD exacerbation/ community acquired pneumonia.  Presented with persistent cough and dyspnea.  Steroid has been discontinued due to steroid-induced psychosis. Continue antitussive agents, airway clearing techniques and bronchodilator therapy/ inhaled corticosteroids.  Completed antibiotic therapy with doxycycline. (Short course 5 days)  Acute on chronic hypoxic respiratory failure, improving Weaned down to 3 L nasal cannula with O2 saturation 97% at rest. Continue incentive spirometer and flutter valve Continue bronchodilators Maintain O2 saturation greater than 90% with ambulation.   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 Creatinine uptrending 1.90 with GFR of 27 from 1.76 Closely monitor renal function back on home torsemide 20 mg daily, restarted x2 days Closely monitor urine output with strict I's and O's  Nasal congestion Flonase as needed x3 days. Reported blood-tinged phlegm after coughing, monitor for now  Resolved acute metabolic encephalopathy Mentation appears to be back to her baseline.    Chronic anxiety/depression  Visibly anxious  She is currently on duloxetine 120 mg at 6 PM and on Valium 2.5  mg nightly.   Resolved supratherapeutic INR INR 2.2, previously 3.4 Appreciate pharmacy's assistance with dosing.  Ambulatory dysfunction/physical debility At home patient use wheelchair when going out of her home PT OT to evaluate Fall precautions. TOC assisting with home health services arrangements   Dyslipidemia.  Continue with statin therapy.    Critical care time: 65 minutes.     Patient continue to be at high risk for worsening hypotension, atrial fibrillation and respiratory failure.    Status is: Inpatient   Remains inpatient appropriate because: continue close cardiopulmonary monitoring      DVT prophylaxis:      Warfarin   Code Status:               DNR   Family Communication:       I spoke with patient's daughter in law and husband at the bedside, we talked in detail about patient's condition, plan of care and prognosis and all questions were addressed.     Antimicrobials:  Doxycycline, completed.      Objective: Vitals:   05/02/21 0400 05/02/21 0900 05/02/21 0915 05/02/21 1131  BP: 139/88  129/81   Pulse:   91 97  Resp:   17 14  Temp:  (!) 97.3 F (36.3 C)    TempSrc:  Oral    SpO2:   91% 98%  Weight:      Height:        Intake/Output Summary (Last 24 hours) at 05/02/2021 1338 Last data filed at 05/02/2021 1100 Gross per 24 hour  Intake 600 ml  Output 700 ml  Net -100 ml   Filed Weights   04/25/21 1513 04/25/21 2258 04/30/21 1600  Weight: 72.6 kg 72.3 kg 71.2 kg    Exam:  General: 80 y.o. year-old female well developed well nourished in no acute distress.  Alert and interactive.  Very hard of hearing. Cardiovascular: Irregular rate and rhythm with no rubs or gallops.  No thyromegaly or JVD noted.   Respiratory: Mild rales noted at bases.  No wheezing noted.  Poor inspiratory effort. Abdomen: Soft nontender nondistended with normal bowel sounds x4 quadrants. Musculoskeletal: No lower extremity edema. 2/4 pulses in all 4  extremities. Skin: No ulcerative lesions noted or rashes, Psychiatry: Mood is appropriate for condition and setting   Data Reviewed: CBC: Recent Labs  Lab 04/26/21 0416 04/28/21 0418 04/29/21 0436 04/30/21 0450 05/01/21 0342  WBC 7.2 13.1* 10.9* 14.1* 13.2*  HGB 8.9* 8.6* 9.3* 9.3* 9.0*  HCT 30.0* 28.8* 30.6* 30.2* 29.8*  MCV 96.8 96.3 96.5 94.7 95.2  PLT 206 243 226 212 924   Basic Metabolic Panel: Recent Labs  Lab 04/28/21 0418 04/29/21 0436 04/30/21 0450 05/01/21 0342 05/02/21 0408  NA 140 140 138  140 139 138  K 4.6 4.2 4.1  4.2 4.3 3.9  CL 102 99 100  101 100 99  CO2 26 31 29  31 30 31   GLUCOSE 110* 104* 92  92 110* 161*  BUN 65* 60* 54*  54* 52* 52*  CREATININE 2.10* 1.94* 1.74*  1.81* 1.76* 1.90*  CALCIUM 8.0* 8.0* 7.9*  8.2* 8.2* 8.3*  PHOS 6.4*  --  4.6 3.9  --    GFR: Estimated Creatinine Clearance: 23.6 mL/min (A) (by C-G formula based on SCr of 1.9 mg/dL (H)). Liver Function Tests: Recent Labs  Lab 04/28/21 0418 04/30/21 0450 05/01/21 0342  ALBUMIN 3.5 3.4* 3.3*   No results for input(s): LIPASE, AMYLASE in the last 168 hours. No results for input(s): AMMONIA in the last 168 hours. Coagulation Profile: Recent Labs  Lab 04/28/21 0418 04/29/21  3810 04/30/21 0450 05/01/21 0342 05/02/21 0408  INR 2.4* 3.2* 3.4* 2.5* 2.2*   Cardiac Enzymes: No results for input(s): CKTOTAL, CKMB, CKMBINDEX, TROPONINI in the last 168 hours. BNP (last 3 results) No results for input(s): PROBNP in the last 8760 hours. HbA1C: No results for input(s): HGBA1C in the last 72 hours. CBG: No results for input(s): GLUCAP in the last 168 hours. Lipid Profile: No results for input(s): CHOL, HDL, LDLCALC, TRIG, CHOLHDL, LDLDIRECT in the last 72 hours. Thyroid Function Tests: No results for input(s): TSH, T4TOTAL, FREET4, T3FREE, THYROIDAB in the last 72 hours. Anemia Panel: No results for input(s): VITAMINB12, FOLATE, FERRITIN, TIBC, IRON, RETICCTPCT in the  last 72 hours. Urine analysis: No results found for: COLORURINE, APPEARANCEUR, LABSPEC, PHURINE, GLUCOSEU, HGBUR, BILIRUBINUR, KETONESUR, PROTEINUR, UROBILINOGEN, NITRITE, LEUKOCYTESUR Sepsis Labs: @LABRCNTIP (procalcitonin:4,lacticidven:4)  ) Recent Results (from the past 240 hour(s))  Resp Panel by RT-PCR (Flu A&B, Covid) Nasopharyngeal Swab     Status: None   Collection Time: 04/25/21  7:56 PM   Specimen: Nasopharyngeal Swab; Nasopharyngeal(NP) swabs in vial transport medium  Result Value Ref Range Status   SARS Coronavirus 2 by RT PCR NEGATIVE NEGATIVE Final    Comment: (NOTE) SARS-CoV-2 target nucleic acids are NOT DETECTED.  The SARS-CoV-2 RNA is generally detectable in upper respiratory specimens during the acute phase of infection. The lowest concentration of SARS-CoV-2 viral copies this assay can detect is 138 copies/mL. A negative result does not preclude SARS-Cov-2 infection and should not be used as the sole basis for treatment or other patient management decisions. A negative result may occur with  improper specimen collection/handling, submission of specimen other than nasopharyngeal swab, presence of viral mutation(s) within the areas targeted by this assay, and inadequate number of viral copies(<138 copies/mL). A negative result must be combined with clinical observations, patient history, and epidemiological information. The expected result is Negative.  Fact Sheet for Patients:  EntrepreneurPulse.com.au  Fact Sheet for Healthcare Providers:  IncredibleEmployment.be  This test is no t yet approved or cleared by the [place] FDA and  has been authorized for detection and/or diagnosis of SARS-CoV-2 by FDA under an Emergency Use Authorization (EUA). This EUA will remain  in effect (meaning this test can be used) for the duration of the COVID-19 declaration under Section 564(b)(1) of the Act, 21 U.S.C.section 360bbb-3(b)(1),  unless the authorization is terminated  or revoked sooner.       Influenza A by PCR NEGATIVE NEGATIVE Final   Influenza B by PCR NEGATIVE NEGATIVE Final    Comment: (NOTE) The Xpert Xpress SARS-CoV-2/FLU/RSV plus assay is intended as an aid in the diagnosis of influenza from Nasopharyngeal swab specimens and should not be used as a sole basis for treatment. Nasal washings and aspirates are unacceptable for Xpert Xpress SARS-CoV-2/FLU/RSV testing.  Fact Sheet for Patients: EntrepreneurPulse.com.au  Fact Sheet for Healthcare Providers: IncredibleEmployment.be  This test is not yet approved or cleared by the [place] FDA and has been authorized for detection and/or diagnosis of SARS-CoV-2 by FDA under an Emergency Use Authorization (EUA). This EUA will remain in effect (meaning this test can be used) for the duration of the COVID-19 declaration under Section 564(b)(1) of the Act, 21 U.S.C. section 360bbb-3(b)(1), unless the authorization is terminated or revoked.  Performed at [facility], [address] [place], [place] [address]   MRSA Next Gen by PCR, Nasal     Status: None   Collection Time: 04/25/21 10:42 PM   Specimen: Nasal Mucosa; Nasal Swab  Result Value Ref Range Status   MRSA by PCR Next Gen NOT DETECTED NOT DETECTED Final    Comment: (NOTE) The GeneXpert MRSA Assay (FDA approved for NASAL specimens only), is one component of a comprehensive MRSA colonization surveillance program. It is not intended to diagnose MRSA infection nor to guide or monitor treatment for MRSA infections. Test performance is not FDA approved in patients less than 74 years old. Performed at [facility], [address] [place], [place] [address]       Studies: DG CHEST PORT 1 VIEW  Result Date: 05/02/2021 CLINICAL DATA:  Palpitations, shortness of breath EXAM: PORTABLE CHEST 1 VIEW COMPARISON:  04/30/2021 FINDINGS: No significant change in AP  portable chest radiograph. Gross cardiomegaly diffuse bilateral interstitial pulmonary opacity and probable small layering pleural effusions. IMPRESSION: No significant change in AP portable chest radiograph. Gross cardiomegaly with diffuse bilateral interstitial pulmonary opacity and probable small layering pleural effusions, consistent with edema or infection. Electronically Signed   By: Delanna Ahmadi M.D.   On: 05/02/2021 10:42    Scheduled Meds:  aspirin EC  81 mg Oral Daily   atorvastatin  80 mg Oral q1800   Chlorhexidine Gluconate Cloth  6 each Topical Q0600   diazepam  2.5 mg Oral QHS   diltiazem  180 mg Oral Daily   DULoxetine  120 mg Oral q1800   fluticasone  2 spray Each Nare Daily   fluticasone furoate-vilanterol  1 puff Inhalation Daily   metoprolol succinate  50 mg Oral QPM   pantoprazole  40 mg Oral Daily   polyethylene glycol  17 g Oral BID   tamsulosin  0.4 mg Oral Daily   torsemide  20 mg Oral Daily   warfarin  5 mg Oral ONCE-1600   Warfarin - Pharmacist Dosing Inpatient   Does not apply q1600    Continuous Infusions:   LOS: 7 days     Kayleen Memos, MD [facility] Pager [phone]  If 7PM-7AM, please contact night-coverage [email or web address] Password [email or web address] 05/02/2021, 1:38 PM

## 2021-05-03 DIAGNOSIS — I4891 Unspecified atrial fibrillation: Secondary | ICD-10-CM | POA: Diagnosis not present

## 2021-05-03 DIAGNOSIS — G8929 Other chronic pain: Secondary | ICD-10-CM

## 2021-05-03 DIAGNOSIS — J9620 Acute and chronic respiratory failure, unspecified whether with hypoxia or hypercapnia: Secondary | ICD-10-CM | POA: Diagnosis not present

## 2021-05-03 DIAGNOSIS — J449 Chronic obstructive pulmonary disease, unspecified: Secondary | ICD-10-CM

## 2021-05-03 DIAGNOSIS — M545 Low back pain, unspecified: Secondary | ICD-10-CM

## 2021-05-03 DIAGNOSIS — J441 Chronic obstructive pulmonary disease with (acute) exacerbation: Secondary | ICD-10-CM

## 2021-05-03 LAB — CBC
HCT: 27.4 % — ABNORMAL LOW (ref 36.0–46.0)
Hemoglobin: 8.4 g/dL — ABNORMAL LOW (ref 12.0–15.0)
MCH: 29.5 pg (ref 26.0–34.0)
MCHC: 30.7 g/dL (ref 30.0–36.0)
MCV: 96.1 fL (ref 80.0–100.0)
Platelets: 183 10*3/uL (ref 150–400)
RBC: 2.85 MIL/uL — ABNORMAL LOW (ref 3.87–5.11)
RDW: 17.5 % — ABNORMAL HIGH (ref 11.5–15.5)
WBC: 10.6 10*3/uL — ABNORMAL HIGH (ref 4.0–10.5)
nRBC: 0 % (ref 0.0–0.2)

## 2021-05-03 LAB — BASIC METABOLIC PANEL
Anion gap: 6 (ref 5–15)
BUN: 53 mg/dL — ABNORMAL HIGH (ref 8–23)
CO2: 31 mmol/L (ref 22–32)
Calcium: 8.2 mg/dL — ABNORMAL LOW (ref 8.9–10.3)
Chloride: 100 mmol/L (ref 98–111)
Creatinine, Ser: 2 mg/dL — ABNORMAL HIGH (ref 0.44–1.00)
GFR, Estimated: 25 mL/min — ABNORMAL LOW (ref >60)
Glucose, Bld: 113 mg/dL — ABNORMAL HIGH (ref 70–99)
Potassium: 3.9 mmol/L (ref 3.5–5.1)
Sodium: 137 mmol/L (ref 135–145)

## 2021-05-03 LAB — MAGNESIUM: Magnesium: 2.2 mg/dL (ref 1.7–2.4)

## 2021-05-03 LAB — PROTIME-INR
INR: 2.5 — ABNORMAL HIGH (ref 0.8–1.2)
Prothrombin Time: 26.7 seconds — ABNORMAL HIGH (ref 11.4–15.2)

## 2021-05-03 LAB — PHOSPHORUS: Phosphorus: 3.8 mg/dL (ref 2.5–4.6)

## 2021-05-03 MED ORDER — LEVALBUTEROL HCL 0.63 MG/3ML IN NEBU
0.6300 mg | INHALATION_SOLUTION | RESPIRATORY_TRACT | Status: DC | PRN
  Administered 2021-05-03: 0.63 mg via RESPIRATORY_TRACT

## 2021-05-03 MED ORDER — DILTIAZEM HCL ER COATED BEADS 180 MG PO CP24: 180.0000 mg | ORAL_CAPSULE | Freq: Every day | ORAL | 0 refills | Status: DC

## 2021-05-03 MED ORDER — METOPROLOL SUCCINATE ER 50 MG PO TB24: 50.0000 mg | ORAL_TABLET | Freq: Every evening | ORAL | 0 refills | Status: DC

## 2021-05-03 MED ORDER — WARFARIN SODIUM 5 MG PO TABS: 5.0000 mg | ORAL_TABLET | Freq: Once | ORAL | Status: DC

## 2021-05-03 MED ORDER — DULOXETINE HCL 60 MG PO CPEP: 60.0000 mg | ORAL_CAPSULE | ORAL | Status: DC

## 2021-05-03 MED ORDER — LEVALBUTEROL HCL 0.63 MG/3ML IN NEBU
0.6300 mg | INHALATION_SOLUTION | Freq: Four times a day (QID) | RESPIRATORY_TRACT | 0 refills | Status: DC | PRN
Start: 2021-05-03 — End: 2021-05-27

## 2021-05-03 NOTE — Discharge Summary (Signed)
Melinda Collins UTM:[id number] DOB: 1940-07-23 DOA: 04/25/2021   PCP: [name], FNP   Admit date: 04/25/2021 Discharge date: 05/03/2021   Admitted From: Home  Disposition:  home    Recommendations for Outpatient Follow-up and new medication changes:  Follow up with Evelina Dun FNP in 7 to 10 days.  Patient placed on diltiazem 120 mg daily and added metoprolol 50 mg bid for heart rate control. Continue anticoagulation with warfarin.  Follow up renal function as outpatient.  Albuterol changed to levalbulterol.  Discontinue amlodipine.      Home Health: yes   Equipment/Devices: home 02     Discharge Condition: stable  CODE STATUS: DNR  Diet recommendation:  heart healthy    Brief/Interim Summary: Melinda Collins was admitted to the hospital with the working diagnosis of atrial fibrillation with rapid ventricular response.  Hospitalization complicated by COPD exacerbation, pneumonia and metabolic encephalopathy with delirium.    80 year old female with past medical history for COPD, chronic hypoxic respiratory failure (4 to 5 L of oxygen per nasal cannula at home), pulmonary hypertension, diastolic heart failure, hypertension, and atrial fibrillation who presented with dyspnea.  Reported worsening symptoms over the last 7 days, decreased physical functional capacity, and positive productive cough.  Her symptoms were refractory to outpatient treatment with prednisone and azithromycin.  On her initial physical examination her blood pressure was 99/86, heart rate 93, temperature 97.6, respiratory 27, oxygen saturation 93%.  Her lungs had decreased breath sounds bilaterally with coarse wheezing, heart S1-S2 present, irregularly irregular, soft abdomen, no lower extremity edema.   Sodium 133, potassium 5.7, chloride 99, bicarb 26, glucose 115, BUN 37, creatinine 1.96, high-sensitivity troponin 12-12, white count 11.8, hemoglobin 9.0, hematocrit 39.3, platelets 252. SARS COVID-19  negative.   Chest radiograph with cardiomegaly positive hilar vascular congestion, bilateral lower lobe interstitial infiltrate, small left pleural effusion.    EKG 124 bpm left axis deviation, right bundle branch block, atrial fibrillation rhythm, poor R wave progression, no significant ST segment or T wave changes.   Patient was placed on intravenous diltiazem for rate control. Anticoagulation with warfarin. Eventually transitioned to oral diltiazem and metoprolol. For acute on chronic diastolic heart failure exacerbation received IV diuresis with with clinical improvement.   She was diagnosed with COPD exacerbation and pneumonia, received bronchodilator therapy and systemic corticosteroids. Unfortunately developed steroid-induced psychosis.   Prolonged hospitalization, slowly improving.  Follow up with home health services.    Chronic atrial fibrillation with acute RVR, complicated with acute on chronic diastolic heart failure exacerbation/acute on chronic hypoxic respiratory failure.  Hypotension.  Patient was hospitalized on the stepdown unit, she received a rate control with intravenous diltiazem and oral metoprolol.  For volume overload she was diuresed with intravenous furosemide. She did require noninvasive mechanical ventilation with BiPAP during hospitalization, eventually transition to supplemental oxygen per nasal cannula.   Further work-up with echocardiogram showed ejection fraction 55 to 56%, RV systolic function preserved recommended RSVP 59.6 m of mercury, consistent with pulmonary hypertension.  Bilateral atrial enlargement, moderate mitral valve regurgitation and moderate aortic stenosis. Positive atrial level shunting detected by color Doppler flow.   Patient achieved euvolemia, now transitioned to torsemide.  Continue rate control with diltiazem and metoprolol.. Anticoagulation with warfarin, discharge INR is 2.5.   2.  Acute kidney injury on chronic kidney disease  stage IV, hyperkalemia.  Patient was diuresed with intravenous furosemide, negative fluid balance was achieved.  She developed hypotension due to overdiuresis. At discharge sodium 137, potassium 3.9, chloride  100, bicarb 31, glucose 113, BUN 53, creatinine 2.0. Follow-up kidney function as an outpatient.   3.  COPD exacerbation, committee acquired pneumonia.  Patient was placed on supplemental oxygen per nasal cannula, received systemic corticosteroids and bronchodilator therapy. She completed a course of antibiotic therapy during hospitalization.   4.  Acute metabolic encephalopathy with delirium.  Depression/anxiety.  She developed delirium during hospitalization, exacerbated by systemic or steroids. She was placed on diazepam and as needed haloperidol. Her mentation slowly improved. Patient will continue duloxetine and diazepam at home. Follow-up as an outpatient.   5.  Dyslipidemia.  Continue statin therapy.   Discharge Diagnoses:  Principal Problem:   Acute on chronic respiratory failure (HCC) Active Problems:   Pulmonary hypertension (HCC)   Atrial fibrillation with RVR (HCC)   Permanent atrial fibrillation (HCC)   Pulmonary emphysema (HCC)   GAD (generalized anxiety disorder)   COPD with acute exacerbation (HCC)   Hyperkalemia       Discharge Instructions     Allergies as of 05/03/2021         Reactions    Baclofen      Feels crazy    Niaspan [niacin]      Flexeril [cyclobenzaprine]      Feels crazy            Medication List       STOP taking these medications     amLODipine 5 MG tablet Commonly known as: NORVASC    azithromycin 250 MG tablet Commonly known as: ZITHROMAX    diltiazem 30 MG tablet Commonly known as: CARDIZEM    potassium chloride 10 MEQ tablet Commonly known as: KLOR-CON    predniSONE 10 MG (21) Tbpk tablet Commonly known as: STERAPRED UNI-PAK 21 TAB           TAKE these medications     acetaminophen 325 MG tablet Commonly  known as: TYLENOL Take 650 mg by mouth every 6 (six) hours as needed for moderate pain.    albuterol 108 (90 Base) MCG/ACT inhaler Commonly known as: VENTOLIN HFA INHALE 1-2 PUFFS INTO THE LUNGS EVERY 4 TO 6 HOURS AS NEEDED FOR SHORTNESS OF BREATH  (NEEDS TO BE SEEN BEFORE NEXT REFILL)    atorvastatin 80 MG tablet Commonly known as: LIPITOR TAKE ONE TABLET BY MOUTH DAILY AT 6 PM    busPIRone 5 MG tablet Commonly known as: BUSPAR Take 1 tablet (5 mg total) by mouth 3 (three) times daily as needed.    CENTRUM SILVER PO Take 1 tablet by mouth daily.    diazepam 5 MG tablet Commonly known as: VALIUM Take 5 mg by mouth every 8 (eight) hours as needed for anxiety.    diclofenac sodium 1 % Gel Commonly known as: VOLTAREN Apply 2 g topically 4 (four) times daily.    diltiazem 180 MG 24 hr capsule Commonly known as: CARDIZEM CD Take 1 capsule (180 mg total) by mouth daily. Start taking on: May 04, 2021    DULCOLAX PO Take 25 mg by mouth daily.    DULoxetine 60 MG capsule Commonly known as: CYMBALTA Take 1 capsule (60 mg total) by mouth See admin instructions. Take 120 mg by mouth every evening    esomeprazole 40 MG capsule Commonly known as: NEXIUM TAKE ONE CAPSULE BY MOUTH ONCE DAILY (MORNING PER PT)    fluticasone 50 MCG/ACT nasal spray Commonly known as: FLONASE USE ONE SPRAY IN EACH NOSTRIL ONCE DAILY. SHAKE GENTLY BEFORE USING. What changed: See the new  instructions.    guaiFENesin 600 MG 12 hr tablet Commonly known as: MUCINEX Take 1 tablet (600 mg total) by mouth 2 (two) times daily as needed.    levalbuterol 0.63 MG/3ML nebulizer solution Commonly known as: XOPENEX Take 3 mLs (0.63 mg total) by nebulization every 6 (six) hours as needed for wheezing or shortness of breath.    loratadine 10 MG tablet Commonly known as: Allergy Relief TAKE ONE TABLET BY MOUTH EVERY DAY (,EVENING) What changed: See the new instructions.    metoprolol succinate 50 MG 24 hr  tablet Commonly known as: TOPROL-XL Take 1 tablet (50 mg total) by mouth every evening. Take with or immediately following a meal.    NON FORMULARY Inhale 5 L into the lungs daily. Oxygen 5 to 6 L every day    nystatin cream Commonly known as: MYCOSTATIN Apply 1 application topically 2 (two) times daily.    oxyCODONE-acetaminophen 5-325 MG tablet Commonly known as: PERCOCET/ROXICET Take 2 tablets by mouth every 8 (eight) hours as needed.    Spacer/Aero-Holding Dorise Bullion 1 Device by Does not apply route in the morning and at bedtime.    torsemide 20 MG tablet Commonly known as: DEMADEX TAKE 1 AND 1/2 TABLETS TO 2 TABLETS BY MOUTH EVERY MORNING ,1& ,1/2 IN PACK EXTRA IN BOTTLE IF NEEDED    Trelegy Ellipta 100-62.5-25 MCG/INH Aepb Generic drug: Fluticasone-Umeclidin-Vilant Inhale 1 puff into the lungs daily.    vitamin B-12 1000 MCG tablet Commonly known as: CYANOCOBALAMIN TAKE ONE TABLET BY MOUTH DAILY WITH BREAKFAST (MORNING) What changed: See the new instructions.    Vitamin D (Ergocalciferol) 1.25 MG (50000 UNIT) Caps capsule Commonly known as: DRISDOL Take 1 capsule (50,000 Units total) by mouth every 7 (seven) days.    warfarin 5 MG tablet Commonly known as: COUMADIN Take as directed. If you are unsure how to take this medication, talk to your nurse or doctor. Original instructions: TAKE 1 OR 2 TABLETS BY MOUTH AS DIRECTED BY [facility] (,1-SU,M,W,TH,F,SA/ ,1 &,1/2-TU)                  Allergies  Allergen Reactions   Baclofen        Feels crazy   Niaspan [Niacin]     Flexeril [Cyclobenzaprine]        Feels crazy            Procedures/Studies: DG CHEST PORT 1 VIEW   Result Date: 05/02/2021 CLINICAL DATA:  Palpitations, shortness of breath EXAM: PORTABLE CHEST 1 VIEW COMPARISON:  04/30/2021 FINDINGS: No significant change in AP portable chest radiograph. Gross cardiomegaly diffuse bilateral interstitial pulmonary opacity and probable small  layering pleural effusions. IMPRESSION: No significant change in AP portable chest radiograph. Gross cardiomegaly with diffuse bilateral interstitial pulmonary opacity and probable small layering pleural effusions, consistent with edema or infection. Electronically Signed   By: Delanna Ahmadi M.D.   On: 05/02/2021 10:42    DG CHEST PORT 1 VIEW   Result Date: 04/30/2021 CLINICAL DATA:  Acute on chronic respiratory failure.  COPD. EXAM: PORTABLE CHEST 1 VIEW COMPARISON:  04/28/2021 FINDINGS: Cardiac enlargement. Prominent pulmonary vascularity. Diffuse interstitial infiltrates bilaterally have progressed significantly in the interval. This may represent edema or infection. Small right pleural effusion. IMPRESSION: Significant progression of diffuse bilateral infiltrates. Favor edema over pneumonia. Electronically Signed   By: Franchot Gallo M.D.   On: 04/30/2021 07:55    DG CHEST PORT 1 VIEW   Result Date: 04/28/2021 CLINICAL DATA:  Short of  breath and cough EXAM: PORTABLE CHEST 1 VIEW COMPARISON:  04/25/2021 FINDINGS: Cardiac enlargement with mild vascular congestion. Negative for edema or effusion. Mild bibasilar airspace disease unchanged. IMPRESSION: No interval change from the prior study. Vascular congestion without edema. Mild bibasilar atelectasis/infiltrate. Electronically Signed   By: Franchot Gallo M.D.   On: 04/28/2021 12:28    DG Chest Port 1 View   Result Date: 04/25/2021 CLINICAL DATA:  Chest pain EXAM: PORTABLE CHEST 1 VIEW COMPARISON:  09/15/2020 FINDINGS: Cardiomegaly, vascular congestion. Mild hyperinflation of the lungs compatible with COPD. No confluent opacities or effusions. No overt edema. IMPRESSION: Cardiomegaly, vascular congestion. COPD. Electronically Signed   By: Rolm Baptise M.D.   On: 04/25/2021 15:58    ECHOCARDIOGRAM COMPLETE   Result Date: 04/26/2021    ECHOCARDIOGRAM REPORT   Patient Name:   LOREAL SCHUESSLER Date of Exam: 04/26/2021 Medical Rec #:  [id number]           Height:       64.0 in Accession #:    [id number]         Weight:       159.4 lb Date of Birth:  [birth year]         BSA:          1.776 m Patient Age:    80 years           BP:           111/72 mmHg Patient Gender: F                  HR:           89 bpm. Exam Location:  [name] Procedure: 2D Echo, Cardiac Doppler and Color Doppler Indications:    Atrial Fibrillation  History:        Patient has no prior history of Echocardiogram examinations.                 CHF, COPD, Arrythmias:Atrial Fibrillation; Risk                 Factors:Dyslipidemia. Tobacco Abuse.  Sonographer:    Wenda Low Referring Phys: [place]  1. Left ventricular ejection fraction, by estimation, is 55 to 60%. The left ventricle has normal function. The left ventricle has no regional wall motion abnormalities. There is mild left ventricular hypertrophy. Left ventricular diastolic parameters are indeterminate.  2. Right ventricular systolic function is normal. The right ventricular size is normal. There is moderately elevated pulmonary artery systolic pressure. The estimated right ventricular systolic pressure is 40.0 mmHg.  3. Left atrial size was severely dilated.  4. Right atrial size was severely dilated.  5. The mitral valve is degenerative. Moderate mitral annular calcification.     Moderate mitral valve regurgitation. Eccentric posterior directed jet, may be underestimating due to eccentric jet.  6. Tricuspid valve regurgitation is mild to moderate.  7. The aortic valve is tricuspid. There is moderate calcification of the aortic valve. Aortic valve regurgitation is mild. Moderate aortic valve stenosis. Vmax 2.9 m/s, MG 60mmHg, AVA 1.2 cm^2, DI 0.46  8. The inferior vena cava is dilated in size with <50% respiratory variability, suggesting right atrial pressure of 15 mmHg.  9. Evidence of atrial level shunting detected by color flow Doppler. FINDINGS  Left Ventricle: Left ventricular ejection fraction, by  estimation, is 55 to 60%. The left ventricle has normal function. The left ventricle has no regional wall motion abnormalities. The left ventricular  internal cavity size was normal in size. There is  mild left ventricular hypertrophy. Left ventricular diastolic parameters are indeterminate. Right Ventricle: The right ventricular size is normal. No increase in right ventricular wall thickness. Right ventricular systolic function is normal. There is moderately elevated pulmonary artery systolic pressure. The tricuspid regurgitant velocity is 3.34 m/s, and with an assumed right atrial pressure of 15 mmHg, the estimated right ventricular systolic pressure is 48.5 mmHg. Left Atrium: Left atrial size was severely dilated. Right Atrium: Right atrial size was severely dilated. Pericardium: There is no evidence of pericardial effusion. Mitral Valve: The mitral valve is degenerative in appearance. Moderate mitral annular calcification. Moderate mitral valve regurgitation. MV peak gradient, 12.5 mmHg. The mean mitral valve gradient is 3.0 mmHg. Tricuspid Valve: The tricuspid valve is normal in structure. Tricuspid valve regurgitation is mild to moderate. Aortic Valve: The aortic valve is tricuspid. There is moderate calcification of the aortic valve. Aortic valve regurgitation is mild. Moderate aortic stenosis is present. Aortic valve mean gradient measures 15.5 mmHg. Aortic valve peak gradient measures 28.9 mmHg. Aortic valve area, by VTI measures 1.62 cm. Pulmonic Valve: The pulmonic valve was not well visualized. Pulmonic valve regurgitation is trivial. Aorta: The aortic root and ascending aorta are structurally normal, with no evidence of dilitation. Venous: The inferior vena cava is dilated in size with less than 50% respiratory variability, suggesting right atrial pressure of 15 mmHg. IAS/Shunts: Evidence of atrial level shunting detected by color flow Doppler.  LEFT VENTRICLE PLAX 2D LVIDd:         5.00 cm   Diastology  LVIDs:         3.40 cm   LV e' medial:    11.10 cm/s LV PW:         1.10 cm   LV E/e' medial:  15.3 LV IVS:        0.80 cm   LV e' lateral:   12.40 cm/s LVOT diam:     2.00 cm   LV E/e' lateral: 13.7 LV SV:         81 LV SV Index:   46 LVOT Area:     3.14 cm  RIGHT VENTRICLE RV Basal diam:  3.50 cm RV Mid diam:    3.00 cm RV S prime:     20.50 cm/s TAPSE (M-mode): 1.6 cm LEFT ATRIUM              Index        RIGHT ATRIUM           Index LA diam:        6.10 cm  3.43 cm/m   RA Area:     26.50 cm LA Vol (A2C):   168.0 ml 94.57 ml/m  RA Volume:   81.50 ml  45.88 ml/m LA Vol (A4C):   149.0 ml 83.87 ml/m LA Biplane Vol: 159.0 ml 89.50 ml/m  AORTIC VALVE                     PULMONIC VALVE AV Area (Vmax):    1.85 cm      PV Vmax:       0.92 m/s AV Area (Vmean):   1.55 cm      PV Peak grad:  3.4 mmHg AV Area (VTI):     1.62 cm AV Vmax:           268.75 cm/s AV Vmean:          180.250 cm/s  AV VTI:            0.499 m AV Peak Grad:      28.9 mmHg AV Mean Grad:      15.5 mmHg LVOT Vmax:         158.67 cm/s LVOT Vmean:        88.700 cm/s LVOT VTI:          0.257 m LVOT/AV VTI ratio: 0.52  AORTA Ao Asc diam: 2.90 cm MITRAL VALVE                TRICUSPID VALVE MV Area (PHT): 4.99 cm     TR Peak grad:   44.6 mmHg MV Area VTI:   2.37 cm     TR Vmax:        334.00 cm/s MV Peak grad:  12.5 mmHg MV Mean grad:  3.0 mmHg     SHUNTS MV Vmax:       1.77 m/s     Systemic VTI:  0.26 m MV Vmean:      63.7 cm/s    Systemic Diam: 2.00 cm MV Decel Time: 152 msec MV E velocity: 170.00 cm/s Oswaldo Milian MD Electronically signed by Oswaldo Milian MD Signature Date/Time: 04/26/2021/11:29:14 AM    Final    Imaging Results          Subjective: Patient is feeling better, her dyspnea is close to baseline, no nausea or vomiting, no chest pain.    Discharge Exam:     Vitals:    05/03/21 0727 05/03/21 0859  BP:      Pulse: 68    Resp: 11    Temp: 97.9 F (36.6 C)    SpO2: 100% 94%          Vitals:    05/02/21  2200 05/03/21 0221 05/03/21 0727 05/03/21 0859  BP:          Pulse:     68    Resp:     11    Temp:     97.9 F (36.6 C)    TempSrc:     Axillary    SpO2: 97% 98% 100% 94%  Weight:          Height:              General: Not in pain or dyspnea  Neurology: Awake and alert, non focal  E ENT: no pallor, no icterus, oral mucosa moist Cardiovascular: No JVD. S1-S2 present, rhythmic, no gallops, rubs, positive murmur systolic 3/6 at the right upper sternal border. No lower extremity edema. Pulmonary: positive breath sounds bilaterally, adequate air movement, no wheezing, rhonchi or rales. Gastrointestinal. Abdomen soft and non tender Skin. No rashes Musculoskeletal: no joint deformities     The results of significant diagnostics from this hospitalization (including imaging, microbiology, ancillary and laboratory) are listed below for reference.       Microbiology:        Recent Results (from the past 240 hour(s))  Resp Panel by RT-PCR (Flu A&B, Covid) Nasopharyngeal Swab     Status: None    Collection Time: 04/25/21  7:56 PM    Specimen: Nasopharyngeal Swab; Nasopharyngeal(NP) swabs in vial transport medium  Result Value Ref Range Status    SARS Coronavirus 2 by RT PCR NEGATIVE NEGATIVE Final      Comment: (NOTE) SARS-CoV-2 target nucleic acids are NOT DETECTED.   The SARS-CoV-2 RNA is generally detectable in upper respiratory specimens during the acute phase of infection.  The lowest concentration of SARS-CoV-2 viral copies this assay can detect is 138 copies/mL. A negative result does not preclude SARS-Cov-2 infection and should not be used as the sole basis for treatment or other patient management decisions. A negative result may occur with  improper specimen collection/handling, submission of specimen other than nasopharyngeal swab, presence of viral mutation(s) within the areas targeted by this assay, and inadequate number of viral copies(<138 copies/mL). A negative result  must be combined with clinical observations, patient history, and epidemiological information. The expected result is Negative.   Fact Sheet for Patients:  EntrepreneurPulse.com.au   Fact Sheet for Healthcare Providers:  IncredibleEmployment.be   This test is no t yet approved or cleared by the [place] FDA and  has been authorized for detection and/or diagnosis of SARS-CoV-2 by FDA under an Emergency Use Authorization (EUA). This EUA will remain  in effect (meaning this test can be used) for the duration of the COVID-19 declaration under Section 564(b)(1) of the Act, 21 U.S.C.section 360bbb-3(b)(1), unless the authorization is terminated  or revoked sooner.           Influenza A by PCR NEGATIVE NEGATIVE Final    Influenza B by PCR NEGATIVE NEGATIVE Final      Comment: (NOTE) The Xpert Xpress SARS-CoV-2/FLU/RSV plus assay is intended as an aid in the diagnosis of influenza from Nasopharyngeal swab specimens and should not be used as a sole basis for treatment. Nasal washings and aspirates are unacceptable for Xpert Xpress SARS-CoV-2/FLU/RSV testing.   Fact Sheet for Patients: EntrepreneurPulse.com.au   Fact Sheet for Healthcare Providers: IncredibleEmployment.be   This test is not yet approved or cleared by the [place] FDA and has been authorized for detection and/or diagnosis of SARS-CoV-2 by FDA under an Emergency Use Authorization (EUA). This EUA will remain in effect (meaning this test can be used) for the duration of the COVID-19 declaration under Section 564(b)(1) of the Act, 21 U.S.C. section 360bbb-3(b)(1), unless the authorization is terminated or revoked.   Performed at [facility], [address] [place], [place] [address]    MRSA Next Gen by PCR, Nasal     Status: None    Collection Time: 04/25/21 10:42 PM    Specimen: Nasal Mucosa; Nasal Swab  Result Value Ref Range Status     MRSA by PCR Next Gen NOT DETECTED NOT DETECTED Final      Comment: (NOTE) The GeneXpert MRSA Assay (FDA approved for NASAL specimens only), is one component of a comprehensive MRSA colonization surveillance program. It is not intended to diagnose MRSA infection nor to guide or monitor treatment for MRSA infections. Test performance is not FDA approved in patients less than 43 years old. Performed at [facility], [address], [place], [place] [address]        Labs: BNP (last 3 results) Recent Labs (within last 365 days)      Recent Labs    04/25/21 1714 05/01/21 0342  BNP 414.0* 527.0*      Basic Metabolic Panel: Last Labs           Recent Labs  Lab 04/28/21 0418 04/29/21 0436 04/30/21 0450 05/01/21 0342 05/02/21 0408 05/03/21 0507  NA 140 140 138  140 139 138 137  K 4.6 4.2 4.1  4.2 4.3 3.9 3.9  CL 102 99 100  101 100 99 100  CO2 26 31 29  31 30 31 31   GLUCOSE 110* 104* 92  92 110* 161* 113*  BUN 65*  60* 54*  54* 52* 52* 53*  CREATININE 2.10* 1.94* 1.74*  1.81* 1.76* 1.90* 2.00*  CALCIUM 8.0* 8.0* 7.9*  8.2* 8.2* 8.3* 8.2*  MG  --   --   --   --   --  2.2  PHOS 6.4*  --  4.6 3.9  --  3.8      Liver Function Tests: Last Labs        Recent Labs  Lab 04/28/21 0418 04/30/21 0450 05/01/21 0342  ALBUMIN 3.5 3.4* 3.3*      Last Labs   No results for input(s): LIPASE, AMYLASE in the last 168 hours.   Last Labs   No results for input(s): AMMONIA in the last 168 hours.   CBC: Last Labs          Recent Labs  Lab 04/28/21 0418 04/29/21 0436 04/30/21 0450 05/01/21 0342 05/03/21 0507  WBC 13.1* 10.9* 14.1* 13.2* 10.6*  HGB 8.6* 9.3* 9.3* 9.0* 8.4*  HCT 28.8* 30.6* 30.2* 29.8* 27.4*  MCV 96.3 96.5 94.7 95.2 96.1  PLT 243 226 212 221 183      Cardiac Enzymes: Last Labs   No results for input(s): CKTOTAL, CKMB, CKMBINDEX, TROPONINI in the last 168 hours.   BNP: Last Labs   Invalid input(s): POCBNP   CBG: Last Labs   No results for  input(s): GLUCAP in the last 168 hours.   D-Dimer Recent Labs (last 2 labs)   No results for input(s): DDIMER in the last 72 hours.   Hgb A1c Recent Labs (last 2 labs)   No results for input(s): HGBA1C in the last 72 hours.   Lipid Profile Recent Labs (last 2 labs)   No results for input(s): CHOL, HDL, LDLCALC, TRIG, CHOLHDL, LDLDIRECT in the last 72 hours.   Thyroid function studies  Recent Labs (last 2 labs)   No results for input(s): TSH, T4TOTAL, T3FREE, THYROIDAB in the last 72 hours.   Invalid input(s): FREET3   Anemia work up [employer] (last 2 labs)   No results for input(s): VITAMINB12, FOLATE, FERRITIN, TIBC, IRON, RETICCTPCT in the last 72 hours.   Urinalysis Labs (Brief)  No results found for: COLORURINE, APPEARANCEUR, LABSPEC, [place], GLUCOSEU, HGBUR, BILIRUBINUR, KETONESUR, PROTEINUR, UROBILINOGEN, NITRITE, LEUKOCYTESUR   Sepsis Labs Last Labs   Invalid input(s): PROCALCITONIN,  WBC,  LACTICIDVEN   Microbiology        Recent Results (from the past 240 hour(s))  Resp Panel by RT-PCR (Flu A&B, Covid) Nasopharyngeal Swab     Status: None    Collection Time: 04/25/21  7:56 PM    Specimen: Nasopharyngeal Swab; Nasopharyngeal(NP) swabs in vial transport medium  Result Value Ref Range Status    SARS Coronavirus 2 by RT PCR NEGATIVE NEGATIVE Final      Comment: (NOTE) SARS-CoV-2 target nucleic acids are NOT DETECTED.   The SARS-CoV-2 RNA is generally detectable in upper respiratory specimens during the acute phase of infection. The lowest concentration of SARS-CoV-2 viral copies this assay can detect is 138 copies/mL. A negative result does not preclude SARS-Cov-2 infection and should not be used as the sole basis for treatment or other patient management decisions. A negative result may occur with  improper specimen collection/handling, submission of specimen other than nasopharyngeal swab, presence of viral mutation(s) within the areas targeted by this  assay, and inadequate number of viral copies(<138 copies/mL). A negative result must be combined with clinical observations, patient history, and epidemiological information. The expected result is Negative.  Fact Sheet for Patients:  EntrepreneurPulse.com.au   Fact Sheet for Healthcare Providers:  IncredibleEmployment.be   This test is no t yet approved or cleared by the [place] FDA and  has been authorized for detection and/or diagnosis of SARS-CoV-2 by FDA under an Emergency Use Authorization (EUA). This EUA will remain  in effect (meaning this test can be used) for the duration of the COVID-19 declaration under Section 564(b)(1) of the Act, 21 U.S.C.section 360bbb-3(b)(1), unless the authorization is terminated  or revoked sooner.           Influenza A by PCR NEGATIVE NEGATIVE Final    Influenza B by PCR NEGATIVE NEGATIVE Final      Comment: (NOTE) The Xpert Xpress SARS-CoV-2/FLU/RSV plus assay is intended as an aid in the diagnosis of influenza from Nasopharyngeal swab specimens and should not be used as a sole basis for treatment. Nasal washings and aspirates are unacceptable for Xpert Xpress SARS-CoV-2/FLU/RSV testing.   Fact Sheet for Patients: EntrepreneurPulse.com.au   Fact Sheet for Healthcare Providers: IncredibleEmployment.be   This test is not yet approved or cleared by the [place] FDA and has been authorized for detection and/or diagnosis of SARS-CoV-2 by FDA under an Emergency Use Authorization (EUA). This EUA will remain in effect (meaning this test can be used) for the duration of the COVID-19 declaration under Section 564(b)(1) of the Act, 21 U.S.C. section 360bbb-3(b)(1), unless the authorization is terminated or revoked.   Performed at [facility], [address], [place], [place] [address]    MRSA Next Gen by PCR, Nasal     Status: None    Collection Time: 04/25/21  10:42 PM    Specimen: Nasal Mucosa; Nasal Swab  Result Value Ref Range Status    MRSA by PCR Next Gen NOT DETECTED NOT DETECTED Final      Comment: (NOTE) The GeneXpert MRSA Assay (FDA approved for NASAL specimens only), is one component of a comprehensive MRSA colonization surveillance program. It is not intended to diagnose MRSA infection nor to guide or monitor treatment for MRSA infections. Test performance is not FDA approved in patients less than 69 years old. Performed at [facility], [address], [place], [place] [address]          Time coordinating discharge: 45 minutes   SIGNED:     Tawni Millers, MD      [facility] 05/03/2021, 10:24 AM              Note Details  Author Cathlean Sauer, Jimmy Picket, MD File Time 05/03/2021 10:40 AM  Author Type Physician Status Signed  Last Editor Tawni Millers, [place] # [phone] [place] Date 04/25/2021

## 2021-05-03 NOTE — Progress Notes (Signed)
ANTICOAGULATION CONSULT NOTE -   Pharmacy Consult for Warfarin (home med) Indication: atrial fibrillation  Allergies  Allergen Reactions   Baclofen     Feels crazy   Niaspan [Niacin]    Flexeril [Cyclobenzaprine]     Feels crazy    Patient Measurements: Height: 5\' 4"  (162.6 cm) Weight: 71.2 kg (157 lb) IBW/kg (Calculated) : 54.7  Vital Signs: Temp: 97.9 F (36.6 C) (10/17 0727) Temp Source: Axillary (10/17 0727) BP: 124/96 (10/16 2129) Pulse Rate: 68 (10/17 0727)  Labs: Recent Labs    05/01/21 0342 05/02/21 0408 05/03/21 0507  HGB 9.0*  --  8.4*  HCT 29.8*  --  27.4*  PLT 221  --  183  LABPROT 26.8* 24.7* 26.7*  INR 2.5* 2.2* 2.5*  CREATININE 1.76* 1.90* 2.00*     Estimated Creatinine Clearance: 22.4 mL/min (A) (by C-G formula based on SCr of 2 mg/dL (H)).   Medical History: Past Medical History:  Diagnosis Date   Arthritis    COPD (chronic obstructive pulmonary disease) ([place])    spirometry 04/20/10 FEV1 1.38 (58%) with ration 70   Depression    Diastolic dysfunction    noted on echo w/some apparent volume issues in past but not severe    HLD (hyperlipidemia)    HOH (hard of hearing)    HTN (hypertension)    mild; pulmonary   Permanent atrial fibrillation (HCC)    Ruptured disk    Tobacco abuse    Uterine cancer (HCC)     Medications:  Medications Prior to Admission  Medication Sig Dispense Refill Last Dose   acetaminophen (TYLENOL) 325 MG tablet Take 650 mg by mouth every 6 (six) hours as needed for moderate pain.   Past Week   albuterol (PROVENTIL) (2.5 MG/3ML) 0.083% nebulizer solution INHALE CONTENTS OF 1 VIAL IN NEBULIZER EVERY 6 HOURS AS NEEDED FOR FOR WHEEZING OR SHORTNESS OF BREATH. (Patient taking differently: Take 2.5 mg by nebulization every 6 (six) hours as needed for shortness of breath.) 90 mL 5 Past Week   albuterol (VENTOLIN HFA) 108 (90 Base) MCG/ACT inhaler INHALE 1-2 PUFFS INTO THE LUNGS EVERY 4 TO 6 HOURS AS NEEDED FOR SHORTNESS  OF BREATH  (NEEDS TO BE SEEN BEFORE NEXT REFILL) 8.5 g 3 Past Week   amLODipine (NORVASC) 5 MG tablet TAKE ONE TABLET BY MOUTH ONCE DAILY (MORNING) 30 tablet 5 Past Week   atorvastatin (LIPITOR) 80 MG tablet TAKE ONE TABLET BY MOUTH DAILY AT 6 PM 30 tablet 0 Past Week   azithromycin (ZITHROMAX) 250 MG tablet Take 500 mg once, then 250 mg for four days 6 tablet 0 Past Week   busPIRone (BUSPAR) 5 MG tablet Take 1 tablet (5 mg total) by mouth 3 (three) times daily as needed. 90 tablet 2 Past Week   diazepam (VALIUM) 5 MG tablet Take 5 mg by mouth every 8 (eight) hours as needed for anxiety.   unknown   diclofenac sodium (VOLTAREN) 1 % GEL Apply 2 g topically 4 (four) times daily. 350 g 3 unknown   diltiazem (CARDIZEM) 30 MG tablet TAKE ONE TABLET BY MOUTH EVERY MORNING AND IN THE EVENING (MORNING,EVENING) 60 tablet 0 Past Week   DULoxetine (CYMBALTA) 60 MG capsule TAKE TWO (2) CAPSULES BY MOUTH DAILY (EVENING) (Patient taking differently: Take 60 mg by mouth See admin instructions. Take 120 mg by mouth every evening) 60 capsule 5 Past Week   esomeprazole (NEXIUM) 40 MG capsule TAKE ONE CAPSULE BY MOUTH ONCE DAILY (MORNING PER  PT) 30 capsule 5 Past Week   fluticasone (FLONASE) 50 MCG/ACT nasal spray USE ONE SPRAY IN EACH NOSTRIL ONCE DAILY. SHAKE GENTLY BEFORE USING. (Patient taking differently: Place 1 spray into both nostrils daily. SHAKE GENTLY BEFORE USING.) 16 g 2 unknown   Fluticasone-Umeclidin-Vilant (TRELEGY ELLIPTA) 100-62.5-25 MCG/INH AEPB Inhale 1 puff into the lungs daily. 3 each 4 Past Week   guaiFENesin (MUCINEX) 600 MG 12 hr tablet Take 1 tablet (600 mg total) by mouth 2 (two) times daily as needed. 60 tablet 1 Past Week   Magnesium Hydroxide (DULCOLAX PO) Take 25 mg by mouth daily.   unknown   Multiple Vitamins-Minerals (CENTRUM SILVER PO) Take 1 tablet by mouth daily.   Past Week   NON FORMULARY Inhale 5 L into the lungs daily. Oxygen 5 to 6 L every day   Past Week   nystatin cream  (MYCOSTATIN) Apply 1 application topically 2 (two) times daily. 30 g 6 unknown   oxyCODONE-acetaminophen (PERCOCET) 5-325 MG per tablet Take 2 tablets by mouth every 8 (eight) hours as needed.   Past Week   potassium chloride (KLOR-CON) 10 MEQ tablet TAKE ONE TABLET BY MOUTH EVERY MORNING 30 tablet 0 Past Week   predniSONE (STERAPRED UNI-PAK 21 TAB) 10 MG (21) TBPK tablet Use as directed 21 tablet 0 Past Week   torsemide (DEMADEX) 20 MG tablet TAKE 1 AND 1/2 TABLETS TO 2 TABLETS BY MOUTH EVERY MORNING ,1& ,1/2 IN PACK EXTRA IN BOTTLE IF NEEDED 45 tablet 0 Past Week   vitamin B-12 (CYANOCOBALAMIN) 1000 MCG tablet TAKE ONE TABLET BY MOUTH DAILY WITH BREAKFAST (MORNING) (Patient taking differently: Take 1,000 mcg by mouth daily.) 30 tablet 5 Past Week   Vitamin D, Ergocalciferol, (DRISDOL) 1.25 MG (50000 UNIT) CAPS capsule Take 1 capsule (50,000 Units total) by mouth every 7 (seven) days. 12 capsule 3 Past Week   Spacer/Aero-Holding Chambers DEVI 1 Device by Does not apply route in the morning and at bedtime. 1 each 1    warfarin (COUMADIN) 5 MG tablet TAKE 1 OR 2 TABLETS BY MOUTH AS DIRECTED BY WARFARIN CLINIC (,1-SU,M,W,TH,F,SA/ ,1 &,1/2-TU) 180 tablet 0 04/24/2021 at 0800    Assessment: 80 y.o. female with a history of chronic respiratory failure on 4 to 5 L home oxygen, COPD with pulmonary hypertension, chronic diastolic heart failure, hypertension, permanent A. fib on Coumadin for anticoagulation, essential hypertension, depression/anxiety. INR SUPRAtherapeutic on admission.  (Likely elevated d/t antibiotics)  INR 2.5 today, therapeutic. Hgb 8.9>9.3 plt 206>226  Goal of Therapy:  INR 2-3 Monitor platelets by anticoagulation protocol: Yes   Plan:  Warfarin 5mg  po x1 today Monitor INR daily Monitor for S/S of bleeding  Isac Sarna, BS Vena [place], BCPS Clinical Pharmacist Pager [phone] 05/03/2021,8:44 AM

## 2021-05-03 NOTE — TOC Transition Note (Signed)
Transition of Care [facility]) - CM/SW Discharge Note   Patient Details  Name: Melinda Collins MRN: [id number] Date of Birth: 08-29-1940  [facility] of [facility]) CM/SW Contact:  Shade Flood, LCSW Phone Number: 05/03/2021, 1:21 PM   Clinical Narrative:     Pt from home and stable for dc today per MD. [facility] and wheelchair orders were entered by MD. [facility] arranged with [place] at family request. Referral for w/c given to [facility] and will be delivered to pt's home.  There were no other TOC needs for dc.  Expected Discharge Plan: [place] Barriers to Discharge: Barriers Resolved   Patient Goals and CMS Choice Patient states their goals for this hospitalization and ongoing recovery are:: go home CMS Medicare.gov Compare Post Acute Care list provided to:: Patient Represenative (must comment) Choice offered to / list presented to : Adult Children  Expected Discharge Plan and Services Expected Discharge Plan: [place] In-house Referral: Clinical Social Work   Post Acute Care Choice: Home Health, Durable Medical Equipment Living arrangements for the past 2 months: [place] Expected Discharge Date: 05/03/21               DME Arranged: Wheelchair manual DME Agency: [facility] Date DME Agency Contacted: 05/03/21   Representative spoke with at DME Agency: Caryl Pina HH Arranged: RN, PT [facility] Agency: [place] ([place]) Date [place]: 05/03/21   Representative spoke with at [place]  Prior Living Arrangements/Services Living arrangements for the past 2 months: [place] Lives with:: Adult Children Patient language and need for interpreter reviewed:: Yes Do you feel safe going back to the place where you live?: Yes      Need for Family Participation in Patient Care: Yes (Comment) Care giver support system in place?: Yes (comment)   Criminal Activity/Legal Involvement Pertinent to Current  Situation/Hospitalization: No - Comment as needed  Activities of Daily Living Home Assistive Devices/Equipment: Wheelchair, Environmental consultant (specify type) (front wheel) ADL Screening (condition at time of admission) Patient's cognitive ability adequate to safely complete daily activities?: Yes Is the patient deaf or have difficulty hearing?: Yes Does the patient have difficulty seeing, even when wearing glasses/contacts?: No Does the patient have difficulty concentrating, remembering, or making decisions?: Yes Patient able to express need for assistance with ADLs?: Yes Does the patient have difficulty dressing or bathing?: No Independently performs ADLs?: No Communication: Independent Dressing (OT): Needs assistance Is this a change from baseline?: Pre-admission baseline Grooming: Needs assistance Is this a change from baseline?: Pre-admission baseline Feeding: Independent Bathing: Needs assistance Is this a change from baseline?: Pre-admission baseline Toileting: Needs assistance Is this a change from baseline?: Pre-admission baseline In/Out Bed: Needs assistance Is this a change from baseline?: Pre-admission baseline Walks in Home: Needs assistance Is this a change from baseline?: Pre-admission baseline Does the patient have difficulty walking or climbing stairs?: Yes Weakness of Legs: Both Weakness of Arms/Hands: Both  Permission Sought/Granted Permission sought to share information with : Chartered certified accountant granted to share information with : Yes, Verbal Permission Granted     Permission granted to share info w AGENCY: [facility], DME        Emotional Assessment       Orientation: : Oriented to Self, Oriented to Place Alcohol / Substance Use: Not Applicable Psych Involvement: No (comment)  Admission diagnosis:  COPD exacerbation ([place]) [J44.1] Atrial fibrillation with RVR (HCC) [I48.91] Acute on chronic respiratory failure ([place]) [J96.20] Patient Active  Problem List   Diagnosis Date Noted   COPD exacerbation ([place])    Acute on chronic respiratory failure ([place]) 04/25/2021   COPD with acute exacerbation ([place]) 04/25/2021   Hyperkalemia 04/25/2021   GAD (generalized anxiety disorder) 04/13/2021   Pulmonary emphysema ([place]) 08/24/2020   Vitamin D deficiency 06/21/2019   Depression, recurrent ([place]) 06/21/2019   Lumbar spondylosis with myelopathy 09/11/2017   Pulmonary nodules 02/08/2017   Aortic atherosclerosis ([place]) 04/22/2016   Permanent atrial fibrillation ([place]) 01/65/5374   Metabolic syndrome 82/70/7867   History of uterine cancer 12/03/2013   Chronic midline low back pain without sciatica 05/23/2013   High risk medication use 05/23/2013   Swelling of limb 05/03/2013   Hyperlipidemia 02/10/2009   TOBACCO ABUSE 02/10/2009   Pulmonary hypertension ([place]) 02/10/2009   Atrial fibrillation with RVR ([place]) 02/10/2009   PCP:  [name], FNP Pharmacy:   [facility], [place] [address] Phone: [phone] Fax: [phone]     Social Determinants of Health (SDOH) Interventions    Readmission Risk Interventions No flowsheet data found.   Final next level of care: [place] Barriers to Discharge: Barriers Resolved   Patient Goals and CMS Choice Patient states their goals for this hospitalization and ongoing recovery are:: go home CMS Medicare.gov Compare Post Acute Care list provided to:: Patient Represenative (must comment) Choice offered to / list presented to : Adult Children  Discharge Placement                       Discharge Plan and Services In-house Referral: Clinical Social Work   Post Acute Care Choice: Home Health, Durable Medical Equipment          DME Arranged: Wheelchair manual DME Agency: [facility] Date DME Agency Contacted: 05/03/21   Representative spoke with at DME Agency: Caryl Pina HH Arranged: RN, PT [facility] Agency: [place] ([place]) Date [place]: 05/03/21   Representative spoke with at [place]: [place] ([place]) Interventions     Readmission Risk Interventions No flowsheet data found.

## 2021-05-03 NOTE — Discharge Planning (Signed)
Physician Discharge Summary  Melinda Collins TDD:[id number] DOB: 16-Jan-1941 DOA: 04/25/2021  PCP: [name], FNP  Admit date: 04/25/2021 Discharge date: 05/03/2021  Admitted From: Home  Disposition:  home   Recommendations for Outpatient Follow-up and new medication changes:  Follow up with Evelina Dun FNP in 7 to 10 days.  Patient placed on diltiazem 120 mg daily and added metoprolol 50 mg bid for heart rate control. Continue anticoagulation with warfarin.  Follow up renal function as outpatient.  Albuterol changed to levalbulterol.  Discontinue amlodipine.    Home Health: yes   Equipment/Devices: home 02    Discharge Condition: stable  CODE STATUS: DNR  Diet recommendation:  heart healthy   Brief/Interim Summary: Melinda Collins was admitted to the hospital with the working diagnosis of atrial fibrillation with rapid ventricular response.  Hospitalization complicated by COPD exacerbation, pneumonia and metabolic encephalopathy with delirium.    80 year old female with past medical history for COPD, chronic hypoxic respiratory failure (4 to 5 L of oxygen per nasal cannula at home), pulmonary hypertension, diastolic heart failure, hypertension, and atrial fibrillation who presented with dyspnea.  Reported worsening symptoms over the last 7 days, decreased physical functional capacity, and positive productive cough.  Her symptoms were refractory to outpatient treatment with prednisone and azithromycin.  On her initial physical examination her blood pressure was 99/86, heart rate 93, temperature 97.6, respiratory 27, oxygen saturation 93%.  Her lungs had decreased breath sounds bilaterally with coarse wheezing, heart S1-S2 present, irregularly irregular, soft abdomen, no lower extremity edema.   Sodium 133, potassium 5.7, chloride 99, bicarb 26, glucose 115, BUN 37, creatinine 1.96, high-sensitivity troponin 12-12, white count 11.8, hemoglobin 9.0, hematocrit 39.3, platelets  252. SARS COVID-19 negative.   Chest radiograph with cardiomegaly positive hilar vascular congestion, bilateral lower lobe interstitial infiltrate, small left pleural effusion.    EKG 124 bpm left axis deviation, right bundle branch block, atrial fibrillation rhythm, poor R wave progression, no significant ST segment or T wave changes.   Patient was placed on intravenous diltiazem for rate control. Anticoagulation with warfarin. Eventually transitioned to oral diltiazem and metoprolol. For acute on chronic diastolic heart failure exacerbation received IV diuresis with with clinical improvement.   She was diagnosed with COPD exacerbation and pneumonia, received bronchodilator therapy and systemic corticosteroids. Unfortunately developed steroid-induced psychosis.   Prolonged hospitalization, slowly improving.  Follow up with home health services.   Chronic atrial fibrillation with acute RVR, complicated with acute on chronic diastolic heart failure exacerbation/acute on chronic hypoxic respiratory failure.  Hypotension.  Patient was hospitalized on the stepdown unit, she received a rate control with intravenous diltiazem and oral metoprolol.  For volume overload she was diuresed with intravenous furosemide. She did require noninvasive mechanical ventilation with BiPAP during hospitalization, eventually transition to supplemental oxygen per nasal cannula.  Further work-up with echocardiogram showed ejection fraction 55 to 62%, RV systolic function preserved recommended RSVP 59.6 m of mercury, consistent with pulmonary hypertension.  Bilateral atrial enlargement, moderate mitral valve regurgitation and moderate aortic stenosis. Positive atrial level shunting detected by color Doppler flow.  Patient achieved euvolemia, now transitioned to torsemide.  Continue rate control with diltiazem and metoprolol.. Anticoagulation with warfarin, discharge INR is 2.5.  2.  Acute kidney injury on chronic  kidney disease stage IV, hyperkalemia.  Patient was diuresed with intravenous furosemide, negative fluid balance was achieved.  She developed hypotension due to overdiuresis. At discharge sodium 137, potassium 3.9, chloride 100, bicarb 31, glucose 113, BUN 53, creatinine  2.0. Follow-up kidney function as an outpatient.  3.  COPD exacerbation, committee acquired pneumonia.  Patient was placed on supplemental oxygen per nasal cannula, received systemic corticosteroids and bronchodilator therapy. She completed a course of antibiotic therapy during hospitalization.  4.  Acute metabolic encephalopathy with delirium.  Depression/anxiety.  She developed delirium during hospitalization, exacerbated by systemic or steroids. She was placed on diazepam and as needed haloperidol. Her mentation slowly improved. Patient will continue duloxetine and diazepam at home. Follow-up as an outpatient.  5.  Dyslipidemia.  Continue statin therapy.  Discharge Diagnoses:  Principal Problem:   Acute on chronic respiratory failure (HCC) Active Problems:   Pulmonary hypertension (HCC)   Atrial fibrillation with RVR (HCC)   Permanent atrial fibrillation (HCC)   Pulmonary emphysema (HCC)   GAD (generalized anxiety disorder)   COPD with acute exacerbation (HCC)   Hyperkalemia    Discharge Instructions   Allergies as of 05/03/2021       Reactions   Baclofen    Feels crazy   Niaspan [niacin]    Flexeril [cyclobenzaprine]    Feels crazy        Medication List     STOP taking these medications    amLODipine 5 MG tablet Commonly known as: NORVASC   azithromycin 250 MG tablet Commonly known as: ZITHROMAX   diltiazem 30 MG tablet Commonly known as: CARDIZEM   potassium chloride 10 MEQ tablet Commonly known as: KLOR-CON   predniSONE 10 MG (21) Tbpk tablet Commonly known as: STERAPRED UNI-PAK 21 TAB       TAKE these medications    acetaminophen 325 MG tablet Commonly known as:  TYLENOL Take 650 mg by mouth every 6 (six) hours as needed for moderate pain.   albuterol 108 (90 Base) MCG/ACT inhaler Commonly known as: VENTOLIN HFA INHALE 1-2 PUFFS INTO THE LUNGS EVERY 4 TO 6 HOURS AS NEEDED FOR SHORTNESS OF BREATH  (NEEDS TO BE SEEN BEFORE NEXT REFILL)   atorvastatin 80 MG tablet Commonly known as: LIPITOR TAKE ONE TABLET BY MOUTH DAILY AT 6 PM   busPIRone 5 MG tablet Commonly known as: BUSPAR Take 1 tablet (5 mg total) by mouth 3 (three) times daily as needed.   CENTRUM SILVER PO Take 1 tablet by mouth daily.   diazepam 5 MG tablet Commonly known as: VALIUM Take 5 mg by mouth every 8 (eight) hours as needed for anxiety.   diclofenac sodium 1 % Gel Commonly known as: VOLTAREN Apply 2 g topically 4 (four) times daily.   diltiazem 180 MG 24 hr capsule Commonly known as: CARDIZEM CD Take 1 capsule (180 mg total) by mouth daily. Start taking on: May 04, 2021   DULCOLAX PO Take 25 mg by mouth daily.   DULoxetine 60 MG capsule Commonly known as: CYMBALTA Take 1 capsule (60 mg total) by mouth See admin instructions. Take 120 mg by mouth every evening   esomeprazole 40 MG capsule Commonly known as: NEXIUM TAKE ONE CAPSULE BY MOUTH ONCE DAILY (MORNING PER PT)   fluticasone 50 MCG/ACT nasal spray Commonly known as: FLONASE USE ONE SPRAY IN EACH NOSTRIL ONCE DAILY. SHAKE GENTLY BEFORE USING. What changed: See the new instructions.   guaiFENesin 600 MG 12 hr tablet Commonly known as: MUCINEX Take 1 tablet (600 mg total) by mouth 2 (two) times daily as needed.   levalbuterol 0.63 MG/3ML nebulizer solution Commonly known as: XOPENEX Take 3 mLs (0.63 mg total) by nebulization every 6 (six) hours as needed for wheezing  or shortness of breath.   loratadine 10 MG tablet Commonly known as: Allergy Relief TAKE ONE TABLET BY MOUTH EVERY DAY (,EVENING) What changed: See the new instructions.   metoprolol succinate 50 MG 24 hr tablet Commonly known as:  TOPROL-XL Take 1 tablet (50 mg total) by mouth every evening. Take with or immediately following a meal.   NON FORMULARY Inhale 5 L into the lungs daily. Oxygen 5 to 6 L every day   nystatin cream Commonly known as: MYCOSTATIN Apply 1 application topically 2 (two) times daily.   oxyCODONE-acetaminophen 5-325 MG tablet Commonly known as: PERCOCET/ROXICET Take 2 tablets by mouth every 8 (eight) hours as needed.   Spacer/Aero-Holding Dorise Bullion 1 Device by Does not apply route in the morning and at bedtime.   torsemide 20 MG tablet Commonly known as: DEMADEX TAKE 1 AND 1/2 TABLETS TO 2 TABLETS BY MOUTH EVERY MORNING ,1& ,1/2 IN PACK EXTRA IN BOTTLE IF NEEDED   Trelegy Ellipta 100-62.5-25 MCG/INH Aepb Generic drug: Fluticasone-Umeclidin-Vilant Inhale 1 puff into the lungs daily.   vitamin B-12 1000 MCG tablet Commonly known as: CYANOCOBALAMIN TAKE ONE TABLET BY MOUTH DAILY WITH BREAKFAST (MORNING) What changed: See the new instructions.   Vitamin D (Ergocalciferol) 1.25 MG (50000 UNIT) Caps capsule Commonly known as: DRISDOL Take 1 capsule (50,000 Units total) by mouth every 7 (seven) days.   warfarin 5 MG tablet Commonly known as: COUMADIN Take as directed. If you are unsure how to take this medication, talk to your nurse or doctor. Original instructions: TAKE 1 OR 2 TABLETS BY MOUTH AS DIRECTED BY [facility] (,1-SU,M,W,TH,F,SA/ ,1 &,1/2-TU)        Allergies  Allergen Reactions   Baclofen     Feels crazy   Niaspan [Niacin]    Flexeril [Cyclobenzaprine]     Feels crazy       Procedures/Studies: DG CHEST PORT 1 VIEW  Result Date: 05/02/2021 CLINICAL DATA:  Palpitations, shortness of breath EXAM: PORTABLE CHEST 1 VIEW COMPARISON:  04/30/2021 FINDINGS: No significant change in AP portable chest radiograph. Gross cardiomegaly diffuse bilateral interstitial pulmonary opacity and probable small layering pleural effusions. IMPRESSION: No significant change in  AP portable chest radiograph. Gross cardiomegaly with diffuse bilateral interstitial pulmonary opacity and probable small layering pleural effusions, consistent with edema or infection. Electronically Signed   By: Delanna Ahmadi M.D.   On: 05/02/2021 10:42   DG CHEST PORT 1 VIEW  Result Date: 04/30/2021 CLINICAL DATA:  Acute on chronic respiratory failure.  COPD. EXAM: PORTABLE CHEST 1 VIEW COMPARISON:  04/28/2021 FINDINGS: Cardiac enlargement. Prominent pulmonary vascularity. Diffuse interstitial infiltrates bilaterally have progressed significantly in the interval. This may represent edema or infection. Small right pleural effusion. IMPRESSION: Significant progression of diffuse bilateral infiltrates. Favor edema over pneumonia. Electronically Signed   By: Franchot Gallo M.D.   On: 04/30/2021 07:55   DG CHEST PORT 1 VIEW  Result Date: 04/28/2021 CLINICAL DATA:  Short of breath and cough EXAM: PORTABLE CHEST 1 VIEW COMPARISON:  04/25/2021 FINDINGS: Cardiac enlargement with mild vascular congestion. Negative for edema or effusion. Mild bibasilar airspace disease unchanged. IMPRESSION: No interval change from the prior study. Vascular congestion without edema. Mild bibasilar atelectasis/infiltrate. Electronically Signed   By: Franchot Gallo M.D.   On: 04/28/2021 12:28   DG Chest Port 1 View  Result Date: 04/25/2021 CLINICAL DATA:  Chest pain EXAM: PORTABLE CHEST 1 VIEW COMPARISON:  09/15/2020 FINDINGS: Cardiomegaly, vascular congestion. Mild hyperinflation of the lungs compatible with COPD. No  confluent opacities or effusions. No overt edema. IMPRESSION: Cardiomegaly, vascular congestion. COPD. Electronically Signed   By: Rolm Baptise M.D.   On: 04/25/2021 15:58   ECHOCARDIOGRAM COMPLETE  Result Date: 04/26/2021    ECHOCARDIOGRAM REPORT   Patient Name:   Melinda Collins Date of Exam: 04/26/2021 Medical Rec #:  [id number]          Height:       64.0 in Accession #:    [id number]         Weight:        159.4 lb Date of Birth:  07-09-1941         BSA:          1.776 m Patient Age:    71 years           BP:           111/72 mmHg Patient Gender: F                  HR:           89 bpm. Exam Location:  [name] Procedure: 2D Echo, Cardiac Doppler and Color Doppler Indications:    Atrial Fibrillation  History:        Patient has no prior history of Echocardiogram examinations.                 CHF, COPD, Arrythmias:Atrial Fibrillation; Risk                 Factors:Dyslipidemia. Tobacco Abuse.  Sonographer:    Wenda Low Referring Phys: [place]  1. Left ventricular ejection fraction, by estimation, is 55 to 60%. The left ventricle has normal function. The left ventricle has no regional wall motion abnormalities. There is mild left ventricular hypertrophy. Left ventricular diastolic parameters are indeterminate.  2. Right ventricular systolic function is normal. The right ventricular size is normal. There is moderately elevated pulmonary artery systolic pressure. The estimated right ventricular systolic pressure is 20.9 mmHg.  3. Left atrial size was severely dilated.  4. Right atrial size was severely dilated.  5. The mitral valve is degenerative. Moderate mitral annular calcification.     Moderate mitral valve regurgitation. Eccentric posterior directed jet, may be underestimating due to eccentric jet.  6. Tricuspid valve regurgitation is mild to moderate.  7. The aortic valve is tricuspid. There is moderate calcification of the aortic valve. Aortic valve regurgitation is mild. Moderate aortic valve stenosis. Vmax 2.9 m/s, MG 32mmHg, AVA 1.2 cm^2, DI 0.46  8. The inferior vena cava is dilated in size with <50% respiratory variability, suggesting right atrial pressure of 15 mmHg.  9. Evidence of atrial level shunting detected by color flow Doppler. FINDINGS  Left Ventricle: Left ventricular ejection fraction, by estimation, is 55 to 60%. The left ventricle has normal function. The left  ventricle has no regional wall motion abnormalities. The left ventricular internal cavity size was normal in size. There is  mild left ventricular hypertrophy. Left ventricular diastolic parameters are indeterminate. Right Ventricle: The right ventricular size is normal. No increase in right ventricular wall thickness. Right ventricular systolic function is normal. There is moderately elevated pulmonary artery systolic pressure. The tricuspid regurgitant velocity is 3.34 m/s, and with an assumed right atrial pressure of 15 mmHg, the estimated right ventricular systolic pressure is 47.0 mmHg. Left Atrium: Left atrial size was severely dilated. Right Atrium: Right atrial size was severely dilated. Pericardium: There is no evidence of pericardial  effusion. Mitral Valve: The mitral valve is degenerative in appearance. Moderate mitral annular calcification. Moderate mitral valve regurgitation. MV peak gradient, 12.5 mmHg. The mean mitral valve gradient is 3.0 mmHg. Tricuspid Valve: The tricuspid valve is normal in structure. Tricuspid valve regurgitation is mild to moderate. Aortic Valve: The aortic valve is tricuspid. There is moderate calcification of the aortic valve. Aortic valve regurgitation is mild. Moderate aortic stenosis is present. Aortic valve mean gradient measures 15.5 mmHg. Aortic valve peak gradient measures 28.9 mmHg. Aortic valve area, by VTI measures 1.62 cm. Pulmonic Valve: The pulmonic valve was not well visualized. Pulmonic valve regurgitation is trivial. Aorta: The aortic root and ascending aorta are structurally normal, with no evidence of dilitation. Venous: The inferior vena cava is dilated in size with less than 50% respiratory variability, suggesting right atrial pressure of 15 mmHg. IAS/Shunts: Evidence of atrial level shunting detected by color flow Doppler.  LEFT VENTRICLE PLAX 2D LVIDd:         5.00 cm   Diastology LVIDs:         3.40 cm   LV e' medial:    11.10 cm/s LV PW:         1.10  cm   LV E/e' medial:  15.3 LV IVS:        0.80 cm   LV e' lateral:   12.40 cm/s LVOT diam:     2.00 cm   LV E/e' lateral: 13.7 LV SV:         81 LV SV Index:   46 LVOT Area:     3.14 cm  RIGHT VENTRICLE RV Basal diam:  3.50 cm RV Mid diam:    3.00 cm RV S prime:     20.50 cm/s TAPSE (M-mode): 1.6 cm LEFT ATRIUM              Index        RIGHT ATRIUM           Index LA diam:        6.10 cm  3.43 cm/m   RA Area:     26.50 cm LA Vol (A2C):   168.0 ml 94.57 ml/m  RA Volume:   81.50 ml  45.88 ml/m LA Vol (A4C):   149.0 ml 83.87 ml/m LA Biplane Vol: 159.0 ml 89.50 ml/m  AORTIC VALVE                     PULMONIC VALVE AV Area (Vmax):    1.85 cm      PV Vmax:       0.92 m/s AV Area (Vmean):   1.55 cm      PV Peak grad:  3.4 mmHg AV Area (VTI):     1.62 cm AV Vmax:           268.75 cm/s AV Vmean:          180.250 cm/s AV VTI:            0.499 m AV Peak Grad:      28.9 mmHg AV Mean Grad:      15.5 mmHg LVOT Vmax:         158.67 cm/s LVOT Vmean:        88.700 cm/s LVOT VTI:          0.257 m LVOT/AV VTI ratio: 0.52  AORTA Ao Asc diam: 2.90 cm MITRAL VALVE  TRICUSPID VALVE MV Area (PHT): 4.99 cm     TR Peak grad:   44.6 mmHg MV Area VTI:   2.37 cm     TR Vmax:        334.00 cm/s MV Peak grad:  12.5 mmHg MV Mean grad:  3.0 mmHg     SHUNTS MV Vmax:       1.77 m/s     Systemic VTI:  0.26 m MV Vmean:      63.7 cm/s    Systemic Diam: 2.00 cm MV Decel Time: 152 msec MV E velocity: 170.00 cm/s Oswaldo Milian MD Electronically signed by Oswaldo Milian MD Signature Date/Time: 04/26/2021/11:29:14 AM    Final       Subjective: Patient is feeling better, her dyspnea is close to baseline, no nausea or vomiting, no chest pain.   Discharge Exam: Vitals:   05/03/21 0727 05/03/21 0859  BP:    Pulse: 68   Resp: 11   Temp: 97.9 F (36.6 C)   SpO2: 100% 94%   Vitals:   05/02/21 2200 05/03/21 0221 05/03/21 0727 05/03/21 0859  BP:      Pulse:   68   Resp:   11   Temp:   97.9 F (36.6 C)    TempSrc:   Axillary   SpO2: 97% 98% 100% 94%  Weight:      Height:        General: Not in pain or dyspnea  Neurology: Awake and alert, non focal  E ENT: no pallor, no icterus, oral mucosa moist Cardiovascular: No JVD. S1-S2 present, rhythmic, no gallops, rubs, positive murmur systolic 3/6 at the right upper sternal border. No lower extremity edema. Pulmonary: positive breath sounds bilaterally, adequate air movement, no wheezing, rhonchi or rales. Gastrointestinal. Abdomen soft and non tender Skin. No rashes Musculoskeletal: no joint deformities   The results of significant diagnostics from this hospitalization (including imaging, microbiology, ancillary and laboratory) are listed below for reference.     Microbiology: Recent Results (from the past 240 hour(s))  Resp Panel by RT-PCR (Flu A&B, Covid) Nasopharyngeal Swab     Status: None   Collection Time: 04/25/21  7:56 PM   Specimen: Nasopharyngeal Swab; Nasopharyngeal(NP) swabs in vial transport medium  Result Value Ref Range Status   SARS Coronavirus 2 by RT PCR NEGATIVE NEGATIVE Final    Comment: (NOTE) SARS-CoV-2 target nucleic acids are NOT DETECTED.  The SARS-CoV-2 RNA is generally detectable in upper respiratory specimens during the acute phase of infection. The lowest concentration of SARS-CoV-2 viral copies this assay can detect is 138 copies/mL. A negative result does not preclude SARS-Cov-2 infection and should not be used as the sole basis for treatment or other patient management decisions. A negative result may occur with  improper specimen collection/handling, submission of specimen other than nasopharyngeal swab, presence of viral mutation(s) within the areas targeted by this assay, and inadequate number of viral copies(<138 copies/mL). A negative result must be combined with clinical observations, patient history, and epidemiological information. The expected result is Negative.  Fact Sheet for Patients:   EntrepreneurPulse.com.au  Fact Sheet for Healthcare Providers:  IncredibleEmployment.be  This test is no t yet approved or cleared by the [place] FDA and  has been authorized for detection and/or diagnosis of SARS-CoV-2 by FDA under an Emergency Use Authorization (EUA). This EUA will remain  in effect (meaning this test can be used) for the duration of the COVID-19 declaration under Section 564(b)(1) of the Act, 21  U.S.C.section 360bbb-3(b)(1), unless the authorization is terminated  or revoked sooner.       Influenza A by PCR NEGATIVE NEGATIVE Final   Influenza B by PCR NEGATIVE NEGATIVE Final    Comment: (NOTE) The Xpert Xpress SARS-CoV-2/FLU/RSV plus assay is intended as an aid in the diagnosis of influenza from Nasopharyngeal swab specimens and should not be used as a sole basis for treatment. Nasal washings and aspirates are unacceptable for Xpert Xpress SARS-CoV-2/FLU/RSV testing.  Fact Sheet for Patients: EntrepreneurPulse.com.au  Fact Sheet for Healthcare Providers: IncredibleEmployment.be  This test is not yet approved or cleared by the [place] FDA and has been authorized for detection and/or diagnosis of SARS-CoV-2 by FDA under an Emergency Use Authorization (EUA). This EUA will remain in effect (meaning this test can be used) for the duration of the COVID-19 declaration under Section 564(b)(1) of the Act, 21 U.S.C. section 360bbb-3(b)(1), unless the authorization is terminated or revoked.  Performed at [facility], [address], [place], [place] [address]   MRSA Next Gen by PCR, Nasal     Status: None   Collection Time: 04/25/21 10:42 PM   Specimen: Nasal Mucosa; Nasal Swab  Result Value Ref Range Status   MRSA by PCR Next Gen NOT DETECTED NOT DETECTED Final    Comment: (NOTE) The GeneXpert MRSA Assay (FDA approved for NASAL specimens only), is one component of a  comprehensive MRSA colonization surveillance program. It is not intended to diagnose MRSA infection nor to guide or monitor treatment for MRSA infections. Test performance is not FDA approved in patients less than 35 years old. Performed at [facility], [address] [place], [place] [address]      Labs: BNP (last 3 results) Recent Labs    04/25/21 1714 05/01/21 0342  BNP 414.0* 970.2*   Basic Metabolic Panel: Recent Labs  Lab 04/28/21 0418 04/29/21 0436 04/30/21 0450 05/01/21 0342 05/02/21 0408 05/03/21 0507  NA 140 140 138  140 139 138 137  K 4.6 4.2 4.1  4.2 4.3 3.9 3.9  CL 102 99 100  101 100 99 100  CO2 26 31 29  31 30 31 31   GLUCOSE 110* 104* 92  92 110* 161* 113*  BUN 65* 60* 54*  54* 52* 52* 53*  CREATININE 2.10* 1.94* 1.74*  1.81* 1.76* 1.90* 2.00*  CALCIUM 8.0* 8.0* 7.9*  8.2* 8.2* 8.3* 8.2*  MG  --   --   --   --   --  2.2  PHOS 6.4*  --  4.6 3.9  --  3.8   Liver Function Tests: Recent Labs  Lab 04/28/21 0418 04/30/21 0450 05/01/21 0342  ALBUMIN 3.5 3.4* 3.3*   No results for input(s): LIPASE, AMYLASE in the last 168 hours. No results for input(s): AMMONIA in the last 168 hours. CBC: Recent Labs  Lab 04/28/21 0418 04/29/21 0436 04/30/21 0450 05/01/21 0342 05/03/21 0507  WBC 13.1* 10.9* 14.1* 13.2* 10.6*  HGB 8.6* 9.3* 9.3* 9.0* 8.4*  HCT 28.8* 30.6* 30.2* 29.8* 27.4*  MCV 96.3 96.5 94.7 95.2 96.1  PLT 243 226 212 221 183   Cardiac Enzymes: No results for input(s): CKTOTAL, CKMB, CKMBINDEX, TROPONINI in the last 168 hours. BNP: Invalid input(s): POCBNP CBG: No results for input(s): GLUCAP in the last 168 hours. D-Dimer No results for input(s): DDIMER in the last 72 hours. Hgb A1c No results for input(s): HGBA1C in the last 72 hours. Lipid Profile No results for input(s): CHOL, HDL, LDLCALC, TRIG, CHOLHDL, LDLDIRECT  in the last 72 hours. Thyroid function studies No results for input(s): TSH, T4TOTAL, T3FREE, THYROIDAB in the  last 72 hours.  Invalid input(s): FREET3 Anemia work up No results for input(s): VITAMINB12, FOLATE, FERRITIN, TIBC, IRON, RETICCTPCT in the last 72 hours. Urinalysis No results found for: COLORURINE, APPEARANCEUR, [place], [place], GLUCOSEU, [place], [place], [place], PROTEINUR, UROBILINOGEN, NITRITE, LEUKOCYTESUR Sepsis Labs Invalid input(s): PROCALCITONIN,  WBC,  LACTICIDVEN Microbiology Recent Results (from the past 240 hour(s))  Resp Panel by RT-PCR (Flu A&B, Covid) Nasopharyngeal Swab     Status: None   Collection Time: 04/25/21  7:56 PM   Specimen: Nasopharyngeal Swab; Nasopharyngeal(NP) swabs in vial transport medium  Result Value Ref Range Status   SARS Coronavirus 2 by RT PCR NEGATIVE NEGATIVE Final    Comment: (NOTE) SARS-CoV-2 target nucleic acids are NOT DETECTED.  The SARS-CoV-2 RNA is generally detectable in upper respiratory specimens during the acute phase of infection. The lowest concentration of SARS-CoV-2 viral copies this assay can detect is 138 copies/mL. A negative result does not preclude SARS-Cov-2 infection and should not be used as the sole basis for treatment or other patient management decisions. A negative result may occur with  improper specimen collection/handling, submission of specimen other than nasopharyngeal swab, presence of viral mutation(s) within the areas targeted by this assay, and inadequate number of viral copies(<138 copies/mL). A negative result must be combined with clinical observations, patient history, and epidemiological information. The expected result is Negative.  Fact Sheet for Patients:  EntrepreneurPulse.com.au  Fact Sheet for Healthcare Providers:  IncredibleEmployment.be  This test is no t yet approved or cleared by the [place] FDA and  has been authorized for detection and/or diagnosis of SARS-CoV-2 by FDA under an Emergency Use Authorization (EUA). This EUA will remain   in effect (meaning this test can be used) for the duration of the COVID-19 declaration under Section 564(b)(1) of the Act, 21 U.S.C.section 360bbb-3(b)(1), unless the authorization is terminated  or revoked sooner.       Influenza A by PCR NEGATIVE NEGATIVE Final   Influenza B by PCR NEGATIVE NEGATIVE Final    Comment: (NOTE) The Xpert Xpress SARS-CoV-2/FLU/RSV plus assay is intended as an aid in the diagnosis of influenza from Nasopharyngeal swab specimens and should not be used as a sole basis for treatment. Nasal washings and aspirates are unacceptable for Xpert Xpress SARS-CoV-2/FLU/RSV testing.  Fact Sheet for Patients: EntrepreneurPulse.com.au  Fact Sheet for Healthcare Providers: IncredibleEmployment.be  This test is not yet approved or cleared by the [place] FDA and has been authorized for detection and/or diagnosis of SARS-CoV-2 by FDA under an Emergency Use Authorization (EUA). This EUA will remain in effect (meaning this test can be used) for the duration of the COVID-19 declaration under Section 564(b)(1) of the Act, 21 U.S.C. section 360bbb-3(b)(1), unless the authorization is terminated or revoked.  Performed at [facility], [address] [place], [place] [address]   MRSA Next Gen by PCR, Nasal     Status: None   Collection Time: 04/25/21 10:42 PM   Specimen: Nasal Mucosa; Nasal Swab  Result Value Ref Range Status   MRSA by PCR Next Gen NOT DETECTED NOT DETECTED Final    Comment: (NOTE) The GeneXpert MRSA Assay (FDA approved for NASAL specimens only), is one component of a comprehensive MRSA colonization surveillance program. It is not intended to diagnose MRSA infection nor to guide or monitor treatment for MRSA infections. Test performance is not FDA approved in patients less than 2  years old. Performed at [facility], [address] [place], [place] [address]      Time coordinating discharge: 45  minutes  SIGNED:   Tawni Millers, MD  [facility] 05/03/2021, 10:24 AM

## 2021-05-03 NOTE — Care Management Important Message (Signed)
Important Message  Patient Details  Name: Melinda Collins MRN: [id number] Date of Birth: [birth year]   Medicare Important Message Given:  Yes (late entry, copy given 04/30/21)     Tommy Medal 05/03/2021, 11:41 AM

## 2021-05-04 ENCOUNTER — Telehealth: Payer: Self-pay

## 2021-05-04 ENCOUNTER — Telehealth: Payer: Self-pay | Admitting: *Deleted

## 2021-05-04 NOTE — Telephone Encounter (Signed)
Call from [place] w/ [facility] Admitted to [facility] services today Wanted to know if order for CMP can be done for couple weeks from now to recheck K+ level Please advise & call Otila Kluver back

## 2021-05-04 NOTE — Telephone Encounter (Signed)
Transition Care Management Follow-up Telephone Call Date of discharge and from where: [name] 05/03/21 Diagnosis: acute on chronic CHF How have you been since you were released from the hospital? Stable - still having lots of breathing issues and back pain - her daughter in law is staying with her (she is an Therapist, sports) Any questions or concerns? No  Items Reviewed: Did the pt receive and understand the discharge instructions provided? Yes  Medications obtained and verified? Yes  Other? No  Any new allergies since your discharge? No  Dietary orders reviewed? Yes Do you have support at home? Yes   Home Care and Equipment/Supplies: Were home health services ordered? yes If so, what is the name of the agency? [facility]  Has the agency set up a time to come to the patient's home? yes Were any new equipment or medical supplies ordered?  Yes: oxygen   What is the name of the medical supply agency? [facility] Were you able to get the supplies/equipment? yes Do you have any questions related to the use of the equipment or supplies? No  Functional Questionnaire: (I = Independent and D = Dependent) ADLs: I with assistance  Bathing/Dressing- I with some assistance  Meal Prep- D  Eating- I  Maintaining continence- I  Transferring/Ambulation- I - with walker  Managing Meds- I (for the most part - DIL and son monitor)  Follow up appointments reviewed:  PCP Hospital f/u appt confirmed?  She is going to call back tomorrow and confirm an appt - offered 05/17/21 Specialist Hospital f/u appt confirmed? Yes  Scheduled to see cardiology on 05/26/21 @ 1:20. Are transportation arrangements needed? No  If their condition worsens, is the pt aware to call PCP or go to the Emergency Dept.? Yes Was the patient provided with contact information for the PCP's office or ED? Yes Was to pt encouraged to call back with questions or concerns? Yes

## 2021-05-04 NOTE — Telephone Encounter (Signed)
Ok for CMP order.

## 2021-05-04 NOTE — Telephone Encounter (Signed)
Tina aware.

## 2021-05-07 ENCOUNTER — Other Ambulatory Visit: Payer: Self-pay | Admitting: *Deleted

## 2021-05-07 NOTE — Progress Notes (Signed)
[facility] call on pt:   Would like the following sent in:  Nystatin cream for rash on belly folds Thrush - would like magic mouthwash / dukes sent in for this  Just got home on 24 hour care - was recently in ICU   [facility] in [place]

## 2021-05-10 MED ORDER — NYSTATIN 100000 UNIT/GM EX POWD: 1.0000 "application " | Freq: Three times a day (TID) | CUTANEOUS | 1 refills | Status: DC

## 2021-05-10 MED ORDER — NYSTATIN 100000 UNIT/GM EX CREA: 1.0000 "application " | TOPICAL_CREAM | Freq: Two times a day (BID) | CUTANEOUS | 2 refills | Status: DC

## 2021-05-10 MED ORDER — NYSTATIN 100000 UNIT/ML MT SUSP: 5.0000 mL | Freq: Four times a day (QID) | OROMUCOSAL | 0 refills | Status: DC | PRN

## 2021-05-10 NOTE — Progress Notes (Signed)
Prescription sent to pharmacy.

## 2021-05-11 ENCOUNTER — Telehealth: Payer: Self-pay | Admitting: Family

## 2021-05-11 ENCOUNTER — Ambulatory Visit (INDEPENDENT_AMBULATORY_CARE_PROVIDER_SITE_OTHER): Payer: Medicare Other

## 2021-05-11 ENCOUNTER — Other Ambulatory Visit: Payer: Self-pay

## 2021-05-11 ENCOUNTER — Telehealth: Payer: Self-pay | Admitting: *Deleted

## 2021-05-11 ENCOUNTER — Other Ambulatory Visit: Payer: Self-pay | Admitting: *Deleted

## 2021-05-11 DIAGNOSIS — J439 Emphysema, unspecified: Secondary | ICD-10-CM | POA: Diagnosis not present

## 2021-05-11 DIAGNOSIS — G9341 Metabolic encephalopathy: Secondary | ICD-10-CM

## 2021-05-11 DIAGNOSIS — N184 Chronic kidney disease, stage 4 (severe): Secondary | ICD-10-CM

## 2021-05-11 DIAGNOSIS — J441 Chronic obstructive pulmonary disease with (acute) exacerbation: Secondary | ICD-10-CM

## 2021-05-11 DIAGNOSIS — I480 Paroxysmal atrial fibrillation: Secondary | ICD-10-CM

## 2021-05-11 DIAGNOSIS — N179 Acute kidney failure, unspecified: Secondary | ICD-10-CM

## 2021-05-11 DIAGNOSIS — I4821 Permanent atrial fibrillation: Secondary | ICD-10-CM

## 2021-05-11 DIAGNOSIS — M1990 Unspecified osteoarthritis, unspecified site: Secondary | ICD-10-CM

## 2021-05-11 DIAGNOSIS — J9621 Acute and chronic respiratory failure with hypoxia: Secondary | ICD-10-CM | POA: Diagnosis not present

## 2021-05-11 DIAGNOSIS — J189 Pneumonia, unspecified organism: Secondary | ICD-10-CM

## 2021-05-11 DIAGNOSIS — E785 Hyperlipidemia, unspecified: Secondary | ICD-10-CM

## 2021-05-11 DIAGNOSIS — Z9181 History of falling: Secondary | ICD-10-CM

## 2021-05-11 DIAGNOSIS — E875 Hyperkalemia: Secondary | ICD-10-CM

## 2021-05-11 DIAGNOSIS — I5033 Acute on chronic diastolic (congestive) heart failure: Secondary | ICD-10-CM

## 2021-05-11 DIAGNOSIS — F32A Depression, unspecified: Secondary | ICD-10-CM

## 2021-05-11 DIAGNOSIS — F411 Generalized anxiety disorder: Secondary | ICD-10-CM

## 2021-05-11 DIAGNOSIS — I130 Hypertensive heart and chronic kidney disease with heart failure and stage 1 through stage 4 chronic kidney disease, or unspecified chronic kidney disease: Secondary | ICD-10-CM

## 2021-05-11 DIAGNOSIS — I272 Pulmonary hypertension, unspecified: Secondary | ICD-10-CM

## 2021-05-11 MED ORDER — METOPROLOL SUCCINATE ER 50 MG PO TB24: 50.0000 mg | ORAL_TABLET | Freq: Every evening | ORAL | 1 refills | Status: DC

## 2021-05-11 MED ORDER — DILTIAZEM HCL ER COATED BEADS 180 MG PO CP24: 180.0000 mg | ORAL_CAPSULE | Freq: Every day | ORAL | 1 refills | Status: DC

## 2021-05-11 NOTE — Telephone Encounter (Signed)
Aware referral has been placed, then they will be in contact

## 2021-05-11 NOTE — Telephone Encounter (Signed)
Talked w/ Ardeen Fillers and called Otila Kluver w/ [facility] back, pt is to go to the ED

## 2021-05-11 NOTE — Telephone Encounter (Signed)
TC from [place] w/ [facility] TC about results she just received from [facility] on pt Hgb 7.8 & HCT 26.5 Pt's daughter says she has been having black stools and coughing up black mucous

## 2021-05-13 ENCOUNTER — Telehealth (INDEPENDENT_AMBULATORY_CARE_PROVIDER_SITE_OTHER): Payer: Medicare Other | Admitting: *Deleted

## 2021-05-13 DIAGNOSIS — Z7901 Long term (current) use of anticoagulants: Secondary | ICD-10-CM | POA: Diagnosis not present

## 2021-05-13 DIAGNOSIS — I480 Paroxysmal atrial fibrillation: Secondary | ICD-10-CM | POA: Diagnosis not present

## 2021-05-13 NOTE — Telephone Encounter (Signed)
Virtual Visit via Telephone Note  I connected with Melinda Collins on 05/13/21 at 11:53 AM by telephone and verified that I am speaking with the correct person using two identifiers. Melinda Collins is currently located at home and her daughter-in-law, Melinda Collins, is currently with her during this visit. The provider, Loman Brooklyn, FNP is located in their office at time of visit.  I discussed the limitations, risks, security and privacy concerns of performing an evaluation and management service by telephone and the availability of in person appointments. I also discussed with the patient that there may be a patient responsible charge related to this service. The patient expressed understanding and agreed to proceed.  Subjective: PCP: [name], FNP  Chief Complaint  Patient presents with   Results    INR faxed   Anticoagulation: Patient here for anticoagulation monitoring. Indication: atrial fibrillation Bleeding Signs/Symptoms:  None Thromboembolic Signs/Symptoms:  None  Missed Coumadin Doses:  None Medication Changes:  has recently been on antibiotics Dietary Changes:  no Bacterial/Viral Infection:  yes - on antibiotics   ROS: Per HPI  Current Outpatient Medications:    acetaminophen (TYLENOL) 325 MG tablet, Take 650 mg by mouth every 6 (six) hours as needed for moderate pain., Disp: , Rfl:    albuterol (VENTOLIN HFA) 108 (90 Base) MCG/ACT inhaler, INHALE 1-2 PUFFS INTO THE LUNGS EVERY 4 TO 6 HOURS AS NEEDED FOR SHORTNESS OF BREATH  (NEEDS TO BE SEEN BEFORE NEXT REFILL), Disp: 8.5 g, Rfl: 3   atorvastatin (LIPITOR) 80 MG tablet, TAKE ONE TABLET BY MOUTH DAILY AT 6 PM, Disp: 30 tablet, Rfl: 0   busPIRone (BUSPAR) 5 MG tablet, Take 1 tablet (5 mg total) by mouth 3 (three) times daily as needed., Disp: 90 tablet, Rfl: 2   diazepam (VALIUM) 5 MG tablet, Take 5 mg by mouth every 8 (eight) hours as needed for anxiety., Disp: , Rfl:    diclofenac sodium (VOLTAREN)  1 % GEL, Apply 2 g topically 4 (four) times daily., Disp: 350 g, Rfl: 3   diltiazem (CARDIZEM CD) 180 MG 24 hr capsule, Take 1 capsule (180 mg total) by mouth daily., Disp: 90 capsule, Rfl: 1   DULoxetine (CYMBALTA) 60 MG capsule, Take 1 capsule (60 mg total) by mouth See admin instructions. Take 120 mg by mouth every evening, Disp: , Rfl:    esomeprazole (NEXIUM) 40 MG capsule, TAKE ONE CAPSULE BY MOUTH ONCE DAILY (MORNING PER PT), Disp: 30 capsule, Rfl: 5   fluticasone (FLONASE) 50 MCG/ACT nasal spray, USE ONE SPRAY IN EACH NOSTRIL ONCE DAILY. SHAKE GENTLY BEFORE USING. (Patient taking differently: Place 1 spray into both nostrils daily. SHAKE GENTLY BEFORE USING.), Disp: 16 g, Rfl: 2   Fluticasone-Umeclidin-Vilant (TRELEGY ELLIPTA) 100-62.5-25 MCG/INH AEPB, Inhale 1 puff into the lungs daily., Disp: 3 each, Rfl: 4   guaiFENesin (MUCINEX) 600 MG 12 hr tablet, Take 1 tablet (600 mg total) by mouth 2 (two) times daily as needed., Disp: 60 tablet, Rfl: 1   levalbuterol (XOPENEX) 0.63 MG/3ML nebulizer solution, Take 3 mLs (0.63 mg total) by nebulization every 6 (six) hours as needed for wheezing or shortness of breath., Disp: 3 mL, Rfl: 0   loratadine (ALLERGY RELIEF) 10 MG tablet, TAKE ONE TABLET BY MOUTH EVERY DAY (,EVENING), Disp: 30 tablet, Rfl: 5   magic mouthwash (nystatin, lidocaine, diphenhydrAMINE) suspension, Take 5 mLs by mouth 4 (four) times daily as needed for mouth pain., Disp: 180 mL, Rfl: 0   Magnesium  Hydroxide (DULCOLAX PO), Take 25 mg by mouth daily., Disp: , Rfl:    metoprolol succinate (TOPROL-XL) 50 MG 24 hr tablet, Take 1 tablet (50 mg total) by mouth every evening. Take with or immediately following a meal., Disp: 90 tablet, Rfl: 1   Multiple Vitamins-Minerals (CENTRUM SILVER PO), Take 1 tablet by mouth daily., Disp: , Rfl:    NON FORMULARY, Inhale 5 L into the lungs daily. Oxygen 5 to 6 L every day, Disp: , Rfl:    nystatin (MYCOSTATIN/NYSTOP) powder, Apply 1 application  topically 3 (three) times daily., Disp: 45 g, Rfl: 1   nystatin cream (MYCOSTATIN), Apply 1 application topically 2 (two) times daily., Disp: 60 g, Rfl: 2   oxyCODONE-acetaminophen (PERCOCET) 5-325 MG per tablet, Take 2 tablets by mouth every 8 (eight) hours as needed., Disp: , Rfl:    Spacer/Aero-Holding Chambers DEVI, 1 Device by Does not apply route in the morning and at bedtime., Disp: 1 each, Rfl: 1   torsemide (DEMADEX) 20 MG tablet, TAKE 1 AND 1/2 TABLETS TO 2 TABLETS BY MOUTH EVERY MORNING ,1& ,1/2 IN PACK EXTRA IN BOTTLE IF NEEDED, Disp: 45 tablet, Rfl: 0   vitamin B-12 (CYANOCOBALAMIN) 1000 MCG tablet, TAKE ONE TABLET BY MOUTH DAILY WITH BREAKFAST (MORNING) (Patient taking differently: Take 1,000 mcg by mouth daily.), Disp: 30 tablet, Rfl: 5   Vitamin D, Ergocalciferol, (DRISDOL) 1.25 MG (50000 UNIT) CAPS capsule, Take 1 capsule (50,000 Units total) by mouth every 7 (seven) days., Disp: 12 capsule, Rfl: 3   warfarin (COUMADIN) 5 MG tablet, TAKE 1 OR 2 TABLETS BY MOUTH AS DIRECTED BY [facility] (,1-SU,M,W,TH,F,SA/ ,1 &,1/2-TU), Disp: 180 tablet, Rfl: 0  Allergies  Allergen Reactions   Baclofen     Feels crazy   Niaspan [Niacin]    Flexeril [Cyclobenzaprine]     Feels crazy   Past Medical History:  Diagnosis Date   Arthritis    COPD (chronic obstructive pulmonary disease) ([place])    spirometry 04/20/10 FEV1 1.38 (58%) with ration 70   Depression    Diastolic dysfunction    noted on echo w/some apparent volume issues in past but not severe    HLD (hyperlipidemia)    HOH (hard of hearing)    HTN (hypertension)    mild; pulmonary   Permanent atrial fibrillation (HCC)    Ruptured disk    Tobacco abuse    Uterine cancer (HCC)     Observations/Objective: A&O  No respiratory distress or wheezing audible over the phone Mood, judgement, and thought processes all WNL   Assessment and Plan: 1-2. Paroxysmal atrial fibrillation (HCC)/Chronic anticoagulation Description   INR  4.5 (goal 2.0-3.0)  Take 2.5 mg (1/2 tablet) today, then resume 5 mg daily except on Tuesdays when she is taking 7.5 mg (1.5 tablets). Recheck in 1 week.     Follow Up Instructions:  I discussed the assessment and treatment plan with the patient. The patient was provided an opportunity to ask questions and all were answered. The patient agreed with the plan and demonstrated an understanding of the instructions.   The patient was advised to call back or seek an in-person evaluation if the symptoms worsen or if the condition fails to improve as anticipated.  The above assessment and management plan was discussed with the patient. The patient verbalized understanding of and has agreed to the management plan. Patient is aware to call the clinic if symptoms persist or worsen. Patient is aware when to return to the clinic for  a follow-up visit. Patient educated on when it is appropriate to go to the emergency department.   Time call ended: 11:58 AM  I provided 5 minutes of non-face-to-face time during this encounter.  Hendricks Limes, MSN, APRN, FNP-C [place]

## 2021-05-13 NOTE — Telephone Encounter (Signed)
Fax received [facility]INR PT/INR self testing service Test date/time 05/11/21 1006 am INR 4.5  CALL PT [phone], if you are unable to get pt we could try calling her [place] [phone]

## 2021-05-13 NOTE — Addendum Note (Signed)
Addended by: Hendricks Limes F on: 05/13/2021 12:01 PM   Modules accepted: Level of Service

## 2021-05-18 ENCOUNTER — Other Ambulatory Visit: Payer: Self-pay | Admitting: Family Medicine

## 2021-05-18 DIAGNOSIS — J439 Emphysema, unspecified: Secondary | ICD-10-CM

## 2021-05-19 ENCOUNTER — Other Ambulatory Visit: Payer: Self-pay | Admitting: Family

## 2021-05-19 ENCOUNTER — Telehealth: Payer: Self-pay | Admitting: Family

## 2021-05-19 NOTE — Telephone Encounter (Signed)
Appt made, DIL aware

## 2021-05-26 ENCOUNTER — Encounter: Payer: Self-pay | Admitting: Cardiology

## 2021-05-26 ENCOUNTER — Ambulatory Visit (INDEPENDENT_AMBULATORY_CARE_PROVIDER_SITE_OTHER): Payer: Medicare Other | Admitting: Cardiology

## 2021-05-26 VITALS — BP 118/78 | HR 105 | Ht 64.0 in | Wt 145.2 lb

## 2021-05-26 DIAGNOSIS — D500 Iron deficiency anemia secondary to blood loss (chronic): Secondary | ICD-10-CM | POA: Diagnosis not present

## 2021-05-26 DIAGNOSIS — I350 Nonrheumatic aortic (valve) stenosis: Secondary | ICD-10-CM

## 2021-05-26 DIAGNOSIS — J439 Emphysema, unspecified: Secondary | ICD-10-CM | POA: Diagnosis not present

## 2021-05-26 DIAGNOSIS — I340 Nonrheumatic mitral (valve) insufficiency: Secondary | ICD-10-CM

## 2021-05-26 DIAGNOSIS — I4821 Permanent atrial fibrillation: Secondary | ICD-10-CM

## 2021-05-26 NOTE — Progress Notes (Signed)
Cardiology Office Note  Date: 05/26/2021   ID: Melinda Collins, DOB Feb 28, 1941, MRN [id number]  PCP:  [name], FNP  Cardiologist:  Rozann Lesches, MD Electrophysiologist:  None   Chief Complaint  Patient presents with   Hospitalization Follow-up     History of Present Illness: Melinda Collins is a 80 y.o. female long-term patient of Dr. Percival Spanish last seen in 2018 and now presenting to establish follow-up with me.  I reviewed her records and updated the chart.  She is here today with her daughter-in-law (nurse) for a hospital follow-up visit.  Record review finds hospitalization in October with COPD exacerbation and pneumonia complicated by rapid atrial fibrillation and metabolic encephalopathy.  I reviewed the discharge summary.  She is currently on Cardizem CD and Toprol-XL for heart rate control, has been receiving these medicines both in the evening recently, has tolerated this better in terms of weakness.  She does not report any sense of palpitations.  I personally reviewed her ECG today which shows atrial fibrillation at 105 bpm with right bundle branch block and left anterior fascicular block.  CHA2DS2-VASc score is 4.  She is on Coumadin with followed by PCP.  Last INR was 2.5.  She has had some intermittent dark stools and apparent GI bleeding with hemoglobin down to 7.9 although rechecked at 8.4 most recently.  Her daughter-in-law states that the stool changes have resolved.  She is now on an iron supplement.  INR had been supratherapeutic after recent antibiotic course.  Recent echocardiogram revealed LVEF 55 to 60% with indeterminate diastolic parameters, moderate to severe pulmonary hypertension with estimated RVSP 59 mmHg, severe biatrial enlargement, moderate mitral regurgitation, and moderate aortic stenosis with mean gradient 17 mmHg and dimensionless index 0.46.  I went over her medications today.  She will follow-up with her PCP this week.  Past  Medical History:  Diagnosis Date   Aortic stenosis    Arthritis    Chronic hypoxemic respiratory failure (HCC)    COPD (chronic obstructive pulmonary disease) (HCC)    Depression    HOH (hard of hearing)    Mitral regurgitation    Mixed hyperlipidemia    Permanent atrial fibrillation (HCC)    Ruptured disk    Uterine cancer [facility]     Past Surgical History:  Procedure Laterality Date   ABDOMINAL HYSTERECTOMY     total   CATARACT EXTRACTION W/PHACO Left 08/25/2014   Procedure: CATARACT EXTRACTION PHACO AND INTRAOCULAR LENS PLACEMENT ([place]);  Surgeon: Tonny Branch, MD;  Location: [facility];  Service: Ophthalmology;  Laterality: Left;  CDE 6.95   CATARACT EXTRACTION W/PHACO Right 09/11/2014   Procedure: CATARACT EXTRACTION PHACO AND INTRAOCULAR LENS PLACEMENT ([facility]);  Surgeon: Tonny Branch, MD;  Location: [facility];  Service: Ophthalmology;  Laterality: Right;  CDE:8.11   CERVICAL DISC SURGERY     x2   KYPHOPLASTY     SPINE SURGERY  2013   cervical fusion    Current Outpatient Medications  Medication Sig Dispense Refill   acetaminophen (TYLENOL) 325 MG tablet Take 650 mg by mouth every 6 (six) hours as needed for moderate pain.     albuterol (VENTOLIN HFA) 108 (90 Base) MCG/ACT inhaler INHALE 1-2 PUFFS INTO THE LUNGS EVERY 4 TO 6 HOURS AS NEEDED FOR SHORTNESS OF BREATH  (NEEDS TO BE SEEN BEFORE NEXT REFILL) 8.5 g 3   atorvastatin (LIPITOR) 80 MG tablet TAKE ONE TABLET BY MOUTH DAILY AT 6 PM 30 tablet 0   busPIRone (  BUSPAR) 5 MG tablet Take 1 tablet (5 mg total) by mouth 3 (three) times daily as needed. 90 tablet 2   diazepam (VALIUM) 5 MG tablet Take 5 mg by mouth every 8 (eight) hours as needed for anxiety.     diclofenac sodium (VOLTAREN) 1 % GEL Apply 2 g topically 4 (four) times daily. 350 g 3   diltiazem (CARDIZEM CD) 180 MG 24 hr capsule Take 1 capsule (180 mg total) by mouth daily. 90 capsule 1   DULoxetine (CYMBALTA) 60 MG capsule Take 1 capsule (60 mg total) by mouth See admin  instructions. Take 120 mg by mouth every evening     esomeprazole (NEXIUM) 40 MG capsule TAKE ONE CAPSULE BY MOUTH ONCE DAILY (MORNING PER PT) 30 capsule 5   fluticasone (FLONASE) 50 MCG/ACT nasal spray USE ONE SPRAY IN EACH NOSTRIL ONCE DAILY. SHAKE GENTLY BEFORE USING. 16 g 5   Fluticasone-Umeclidin-Vilant (TRELEGY ELLIPTA) 100-62.5-25 MCG/INH AEPB Inhale 1 puff into the lungs daily. 3 each 4   guaiFENesin (MUCINEX) 600 MG 12 hr tablet Take 1 tablet (600 mg total) by mouth 2 (two) times daily as needed. 60 tablet 1   levalbuterol (XOPENEX) 0.63 MG/3ML nebulizer solution Take 3 mLs (0.63 mg total) by nebulization every 6 (six) hours as needed for wheezing or shortness of breath. 3 mL 0   loratadine (ALLERGY RELIEF) 10 MG tablet TAKE ONE TABLET BY MOUTH EVERY DAY (,EVENING) 30 tablet 5   magic mouthwash (nystatin, lidocaine, diphenhydrAMINE) suspension Take 5 mLs by mouth 4 (four) times daily as needed for mouth pain. 180 mL 0   Magnesium Hydroxide (DULCOLAX PO) Take 25 mg by mouth daily.     metoprolol succinate (TOPROL-XL) 50 MG 24 hr tablet Take 1 tablet (50 mg total) by mouth every evening. Take with or immediately following a meal. 90 tablet 1   Multiple Vitamins-Minerals (CENTRUM SILVER PO) Take 1 tablet by mouth daily.     NON FORMULARY Inhale 5 L into the lungs daily. Oxygen 5 to 6 L every day     nystatin (MYCOSTATIN/NYSTOP) powder Apply 1 application topically 3 (three) times daily. 45 g 1   nystatin cream (MYCOSTATIN) Apply 1 application topically 2 (two) times daily. 60 g 2   oxyCODONE-acetaminophen (PERCOCET) 5-325 MG per tablet Take 2 tablets by mouth every 8 (eight) hours as needed.     Spacer/Aero-Holding Chambers DEVI 1 Device by Does not apply route in the morning and at bedtime. 1 each 1   torsemide (DEMADEX) 20 MG tablet TAKE 1 AND 1/2 TABLETS TO 2 TABLETS BY MOUTH EVERY MORNING ,1& ,1/2 IN PACK EXTRA IN BOTTLE IF NEEDED 45 tablet 0   vitamin B-12 (CYANOCOBALAMIN) 1000 MCG tablet  TAKE ONE TABLET BY MOUTH DAILY WITH BREAKFAST (MORNING) (Patient taking differently: Take 1,000 mcg by mouth daily.) 30 tablet 5   Vitamin D, Ergocalciferol, (DRISDOL) 1.25 MG (50000 UNIT) CAPS capsule Take 1 capsule (50,000 Units total) by mouth every 7 (seven) days. 12 capsule 3   warfarin (COUMADIN) 5 MG tablet TAKE 1 OR 2 TABLETS BY MOUTH AS DIRECTED BY WARFARIN CLINIC (,1-SU,M,W,TH,F,SA/ ,1 &,1/2-TU) 180 tablet 0   No current facility-administered medications for this visit.   Allergies:  Baclofen, Niaspan [niacin], and Flexeril [cyclobenzaprine]   Social History: The patient  reports that she has quit smoking. Her smoking use included cigarettes. She started smoking about 60 years ago. She smoked an average of 1 pack per day. She has never used smokeless tobacco. She  reports that she does not drink alcohol and does not use drugs.   Family History: The patient's family history includes COPD in her brother; Cancer in her brother, brother, and father; Coronary artery disease in an other family member; Diabetes in her mother; Heart attack in her father; Heart attack (age of onset: 1) in her brother; Heart disease in her brother and mother; Kidney disease in her father; Stroke in her mother.   ROS: Hearing loss, fatigue, wears oxygen supplementation via nasal cannula.  Physical Exam: VS:  BP 118/78   Pulse (!) 105   Ht 5\' 4"  (1.626 m)   Wt 145 lb 3.2 oz (65.9 kg)   SpO2 95%   BMI 24.92 kg/m , BMI Body mass index is 24.92 kg/m.  Wt Readings from Last 3 Encounters:  05/26/21 145 lb 3.2 oz (65.9 kg)  04/30/21 157 lb (71.2 kg)  04/08/21 158 lb (71.7 kg)    General: Chronically ill-appearing elderly woman seated in wheelchair wearing oxygen via nasal cannula.  No distress. HEENT: Conjunctiva and lids normal. Neck: Supple, no elevated JVP or carotid bruits, no thyromegaly. Lungs: Decreased breath sounds throughout without wheezing. Cardiac: Irregularly irregular, no S3, 5-4/6 systolic  murmur, no pericardial rub. Abdomen: Soft, nontender, bowel sounds present. Extremities: No pitting edema. Skin: Warm and dry. Musculoskeletal: Kyphosis. Neuropsychiatric: Alert and oriented x3, affect grossly appropriate.  Hearing loss evident.  ECG:  An ECG dated 05/02/2021 was personally reviewed today and demonstrated:  Atrial fibrillation with right bundle branch block and left anterior fascicular block.  Recent Labwork: 04/13/2021: ALT 14; AST 13 05/01/2021: B Natriuretic Peptide 527.0 05/03/2021: BUN 53; Creatinine, Ser 2.00; Hemoglobin 8.4; Magnesium 2.2; Platelets 183; Potassium 3.9; Sodium 137     Component Value Date/Time   CHOL 154 06/18/2019 1558   CHOL 121 08/06/2013 1123   TRIG 191 (H) 06/18/2019 1558   TRIG 302 (H) 02/16/2016 1247   TRIG 136 08/06/2013 1123   HDL 59 06/18/2019 1558   HDL 51 02/16/2016 1247   HDL 48 08/06/2013 1123   CHOLHDL 2.6 06/18/2019 1558   LDLCALC 64 06/18/2019 1558   LDLCALC 46 08/06/2013 1123  October 2022: Hemoglobin 8.4, platelets 244, fecal occult negative  Other Studies Reviewed Today:  Echocardiogram 04/26/2021:  1. Left ventricular ejection fraction, by estimation, is 55 to 60%. The  left ventricle has normal function. The left ventricle has no regional  wall motion abnormalities. There is mild left ventricular hypertrophy.  Left ventricular diastolic parameters  are indeterminate.   2. Right ventricular systolic function is normal. The right ventricular  size is normal. There is moderately elevated pulmonary artery systolic  pressure. The estimated right ventricular systolic pressure is 56.8 mmHg.   3. Left atrial size was severely dilated.   4. Right atrial size was severely dilated.   5. The mitral valve is degenerative. Moderate mitral annular  calcification.      Moderate mitral valve regurgitation. Eccentric posterior directed jet,  may be underestimating due to eccentric jet.   6. Tricuspid valve regurgitation is mild  to moderate.   7. The aortic valve is tricuspid. There is moderate calcification of the  aortic valve. Aortic valve regurgitation is mild. Moderate aortic valve  stenosis. Vmax 2.9 m/s, MG 43mmHg, AVA 1.2 cm^2, DI 0.46   8. The inferior vena cava is dilated in size with <50% respiratory  variability, suggesting right atrial pressure of 15 mmHg.   9. Evidence of atrial level shunting detected by color flow Doppler.  Assessment and Plan:  1.  Permanent atrial fibrillation with CHA2DS2-VASc score of 4.  She has had recent RVR in the setting of acute illness and hospitalization discussed above.  Heart rate around 100 today on combination of Cardizem CD 180 mg daily and Toprol-XL 50 mg daily.  Blood pressure initially low in the 80s, rechecked systolic at 833 by me.  Would keep both medications dosed in the evening.  Track blood pressure at home.  At this point she remains on Coumadin with follow-up by PCP with recent INR 2.5.  2.  Valvular heart disease including moderate calcific aortic stenosis with mean gradient 17 mmHg and also moderate mitral regurgitation.  3.  Apparent GI bleed with stool changes as discussed above and hemoglobin down to 7.9.  Recheck hemoglobin up to 8.4 and follow-up with PCP pending.  She is now on iron supplement and back on Coumadin.  Patient's daughter-in-law states that stool changes have completely resolved.  Recheck CBC for next visit.  4.  COPD with chronic hypoxic respiratory failure on supplemental oxygen.  5.  Moderate to severe pulmonary hypertension likely combination of COPD, diastolic dysfunction, and valvular heart disease.  Medication Adjustments/Labs and Tests Ordered: Current medicines are reviewed at length with the patient today.  Concerns regarding medicines are outlined above.   Tests Ordered: Orders Placed This Encounter  Procedures   EKG 12-Lead     Medication Changes: No orders of the defined types were placed in this  encounter.   Disposition:  Follow up  6 weeks.  Signed, Satira Sark, MD, [facility] 05/26/2021 2:09 PM    [facility] [place], [place], [place] [address] Phone: [phone]; Fax: [phone]

## 2021-05-26 NOTE — Patient Instructions (Addendum)
Medication Instructions:  Your physician recommends that you continue on your current medications as directed. Please refer to the Current Medication list given to you today.  Labwork: none  Testing/Procedures: none  Follow-Up: Your physician recommends that you schedule a follow-up appointment in: 6 weeks  Any Other Special Instructions Will Be Listed Below (If Applicable).  If you need a refill on your cardiac medications before your next appointment, please call your pharmacy. 

## 2021-05-27 ENCOUNTER — Other Ambulatory Visit: Payer: Self-pay

## 2021-05-27 ENCOUNTER — Encounter: Payer: Self-pay | Admitting: Family

## 2021-05-27 ENCOUNTER — Ambulatory Visit (INDEPENDENT_AMBULATORY_CARE_PROVIDER_SITE_OTHER): Payer: Medicare Other | Admitting: Family

## 2021-05-27 VITALS — BP 111/75 | HR 98 | Temp 96.7°F | Ht 64.0 in | Wt 145.0 lb

## 2021-05-27 DIAGNOSIS — J441 Chronic obstructive pulmonary disease with (acute) exacerbation: Secondary | ICD-10-CM | POA: Diagnosis not present

## 2021-05-27 DIAGNOSIS — I4891 Unspecified atrial fibrillation: Secondary | ICD-10-CM | POA: Diagnosis not present

## 2021-05-27 DIAGNOSIS — Z515 Encounter for palliative care: Secondary | ICD-10-CM

## 2021-05-27 DIAGNOSIS — J9610 Chronic respiratory failure, unspecified whether with hypoxia or hypercapnia: Secondary | ICD-10-CM

## 2021-05-27 DIAGNOSIS — F339 Major depressive disorder, recurrent, unspecified: Secondary | ICD-10-CM

## 2021-05-27 DIAGNOSIS — F411 Generalized anxiety disorder: Secondary | ICD-10-CM

## 2021-05-27 DIAGNOSIS — Z09 Encounter for follow-up examination after completed treatment for conditions other than malignant neoplasm: Secondary | ICD-10-CM

## 2021-05-27 DIAGNOSIS — D649 Anemia, unspecified: Secondary | ICD-10-CM

## 2021-05-27 MED ORDER — LEVALBUTEROL HCL 0.63 MG/3ML IN NEBU: 0.6300 mg | INHALATION_SOLUTION | Freq: Four times a day (QID) | RESPIRATORY_TRACT | 0 refills | Status: DC | PRN

## 2021-05-27 MED ORDER — ALPRAZOLAM 0.25 MG PO TABS: 0.2500 mg | ORAL_TABLET | Freq: Every evening | ORAL | 2 refills | Status: DC | PRN

## 2021-05-27 NOTE — Progress Notes (Signed)
Subjective:    Patient ID: Melinda Collins, female    DOB: 1941-04-03, 80 y.o.   MRN: [id number]  Chief Complaint  Patient presents with   Hospitalization Follow-up   PT presents to the office today for hospital follow up. She went to the ED on 04/25/21 with A Fib and COPD exacerbation. She saw her Cardiologists yesterday.   She had a palliative care nurse come to her home to discuss her care.  Hypertension This is a chronic problem. The current episode started more than 1 year ago. The problem has been resolved since onset. The problem is controlled. Associated symptoms include anxiety, malaise/fatigue and shortness of breath. Pertinent negatives include no headaches or peripheral edema. Risk factors for coronary artery disease include dyslipidemia and obesity. Past treatments include calcium channel blockers. The current treatment provides moderate improvement.  Cough This is a recurrent problem. The current episode started 1 to 4 weeks ago. The problem has been waxing and waning. The problem occurs every few minutes. The cough is Non-productive. Associated symptoms include myalgias, nasal congestion, shortness of breath and wheezing. Pertinent negatives include no chills, ear congestion, ear pain, fever or headaches. She has tried rest and OTC cough suppressant for the symptoms. The treatment provided mild relief.  Depression        This is a chronic problem.  The current episode started more than 1 year ago.   Associated symptoms include fatigue, helplessness, hopelessness, irritable, restlessness, myalgias and sad.  Associated symptoms include no headaches.  Past treatments include SNRIs - Serotonin and norepinephrine reuptake inhibitors.  Past medical history includes anxiety.   Anxiety Presents for follow-up visit. Symptoms include depressed mood, excessive worry, irritability, nervous/anxious behavior, restlessness and shortness of breath. Symptoms occur most days. The severity of  symptoms is moderate.   Her past medical history is significant for anemia.  Anemia Presents for follow-up visit. Symptoms include malaise/fatigue. There has been no bruising/bleeding easily or fever.     Review of Systems  Constitutional:  Positive for fatigue, irritability and malaise/fatigue. Negative for chills and fever.  HENT:  Negative for ear pain.   Respiratory:  Positive for cough, shortness of breath and wheezing.   Musculoskeletal:  Positive for myalgias.  Neurological:  Negative for headaches.  Hematological:  Does not bruise/bleed easily.  Psychiatric/Behavioral:  Positive for depression. The patient is nervous/anxious.   All other systems reviewed and are negative.     Objective:   Physical Exam Vitals reviewed.  Constitutional:      General: She is irritable. She is not in acute distress.    Appearance: She is well-developed. She is obese.  HENT:     Head: Normocephalic and atraumatic.     Right Ear: Tympanic membrane normal.     Left Ear: Tympanic membrane normal.  Eyes:     Pupils: Pupils are equal, round, and reactive to light.  Neck:     Thyroid: No thyromegaly.  Cardiovascular:     Rate and Rhythm: Normal rate and regular rhythm.     Heart sounds: Normal heart sounds. No murmur heard. Pulmonary:     Effort: Pulmonary effort is normal. No respiratory distress.     Breath sounds: Rhonchi present. No wheezing.     Comments: 4 L Abdominal:     General: Bowel sounds are normal. There is no distension.     Palpations: Abdomen is soft.     Tenderness: There is no abdominal tenderness.  Musculoskeletal:  General: No tenderness. Normal range of motion.     Cervical back: Normal range of motion and neck supple.  Skin:    General: Skin is warm and dry.  Neurological:     Mental Status: She is alert and oriented to person, place, and time.     Cranial Nerves: No cranial nerve deficit.     Deep Tendon Reflexes: Reflexes are normal and symmetric.   Psychiatric:        Behavior: Behavior normal.        Thought Content: Thought content normal.        Judgment: Judgment normal.      BP 111/75 Comment: could not get  Pulse 98   Temp (!) 96.7 F (35.9 C) (Temporal)   Ht _0  (1.626 m)   Wt 145 lb (65.8 kg)   SpO2 99%   BMI 24.89 kg/m      Assessment & Plan:  [name] G Amenta comes in today with chief complaint of Hospitalization Follow-up   Diagnosis and orders addressed:  1. COPD exacerbation ([place]) - CMP14+EGFR - CBC with Differential/Platelet  2. Atrial fibrillation with RVR (HCC) - CMP14+EGFR - CBC with Differential/Platelet  3. Hospital discharge follow-up - CMP14+EGFR - CBC with Differential/Platelet  4. GAD (generalized anxiety disorder) - CMP14+EGFR - CBC with Differential/Platelet - ALPRAZolam (XANAX) 0.25 MG tablet; Take 1 tablet (0.25 mg total) by mouth at bedtime as needed for anxiety.  Dispense: 30 tablet; Refill: 2  5. Depression, recurrent ([place]) - CMP14+EGFR - CBC with Differential/Platelet  6. Anemia, unspecified type  7. Chronic respiratory failure, unspecified whether with hypoxia or hypercapnia (HCC) Continue 4 L oxygen   8. Palliative care patient  Pt has stopped oxycodone, will start xanax as needed.  Discussed risks Continue with palliative care  Labs pending Health Maintenance reviewed Diet and exercise encouraged  Follow up plan: 3 months   Evelina Dun, FNP

## 2021-05-27 NOTE — Patient Instructions (Signed)
Chronic Obstructive Pulmonary Disease °Chronic obstructive pulmonary disease (COPD) is a long-term (chronic) condition that affects the lungs. COPD is a general term that can be used to describe many different lung problems that cause lung inflammation and limit airflow, including chronic bronchitis and emphysema. °If you have COPD, your lung function will probably never return to normal. In most cases, it gets worse over time. However, there are steps you can take to slow the progression of the disease and improve your quality of life. °What are the causes? °This condition may be caused by: °Smoking. This is the most common cause. °Certain genes passed down through families. °What increases the risk? °The following factors may make you more likely to develop this condition: °Being exposed to secondhand smoke from cigarettes, pipes, or cigars. °Being exposed to chemicals and other irritants, such as fumes and dust in the work environment. °Having chronic lung conditions or infections. °What are the signs or symptoms? °Symptoms of this condition include: °Shortness of breath, especially during physical activity. °Chronic cough with a large amount of thick mucus. Sometimes, the cough may not have any mucus (dry cough). °Wheezing and rapid breathing. °Gray or bluish discoloration (cyanosis) of the skin, especially in the fingers, toes, or lips. °Feeling tired (fatigue). °Weight loss. °Chest tightness. °Frequent infections. °Episodes when breathing symptoms become much worse (exacerbations). °At the later stages of this disease, you may have swelling in the ankles, feet, or legs. °How is this diagnosed? °This condition is diagnosed based on: °Your medical history. °A physical exam. °You may also have tests, including: °Lung (pulmonary) function tests. This may include a spirometry test, which measures your ability to exhale properly. °Chest X-ray. °CT scan. °Blood tests. °How is this treated? °This condition may be  treated with: °Medicines. These may include inhaled rescue medicines to treat acute exacerbations as well as medicines that you take long-term (maintenance medicines) to prevent flare-ups of COPD. °Bronchodilators help treat COPD by dilating the airways to allow increased airflow and make your breathing more comfortable. °Steroids can reduce airway inflammation and help prevent exacerbations. °Smoking cessation. If you smoke, your health care provider may ask you to quit, and may also recommend therapy or replacement products to help you quit. °Pulmonary rehabilitation. This may involve working with a team of health care providers and specialists, such as respiratory, occupational, and physical therapists. °Exercise and physical activity. These are beneficial for nearly all people with COPD. °Nutrition therapy to gain weight, if you are underweight. °Oxygen. Supplemental oxygen therapy is only helpful if you have a low oxygen level in your blood (hypoxemia). °Lung surgery or transplant. °Palliative care. This is to help people with COPD feel comfortable when treatment is no longer working. °Follow these instructions at home: °Medicines °Take over-the-counter and prescription medicines only as told by your health care provider. This includes inhaled medicines and pills. °Talk to your health care provider before taking any cough or allergy medicines. You may need to avoid certain medicines that dry out your airways. °Lifestyle °If you smoke, the most important thing that you can do is to stop smoking. Continuing to smoke will cause the disease to progress faster. °Do not use any products that contain nicotine or tobacco. These products include cigarettes, chewing tobacco, and vaping devices, such as e-cigarettes. If you need help quitting, ask your health care provider. °Avoid exposure to things that irritate your lungs, such as smoke, chemicals, and fumes. °Stay active, but balance activity with periods of rest.  Exercise and physical   activity will help you maintain your ability to do things you want to do. °Learn and use relaxation techniques to manage stress and to control your breathing. °Get the right amount of sleep and get quality sleep. Most adults need 7 or more hours per night. °Eat healthy foods. Eating smaller, more frequent meals and resting before meals may help you maintain your strength. °Controlled breathing °Learn and use controlled breathing techniques as directed by your health care provider. Controlled breathing techniques include: °Pursed lip breathing. Start by breathing in (inhaling) through your nose for 1 second. Then, purse your lips as if you were going to whistle and breathe out (exhale) through the pursed lips for 2 seconds. °Diaphragmatic breathing. Start by putting one hand on your abdomen just above your waist. Inhale slowly through your nose. The hand on your abdomen should move out. Then purse your lips and exhale slowly. You should be able to feel the hand on your abdomen moving in as you exhale. ° °Controlled coughing °Learn and use controlled coughing to clear mucus from your lungs. Controlled coughing is a series of short, progressive coughs. The steps of controlled coughing are: °Lean your head slightly forward. °Breathe in deeply using diaphragmatic breathing. °Try to hold your breath for 3 seconds. °Keep your mouth slightly open while coughing twice. °Spit any mucus out into a tissue. °Rest and repeat the steps once or twice as needed. °General instructions °Make sure you receive all the vaccines that your health care provider recommends, especially the pneumococcal and influenza vaccines. Preventing infection and hospitalization is very important when you have COPD. °Drink enough fluid to keep your urine pale yellow, unless you have a medical condition that requires fluid restriction. °Use oxygen therapy and pulmonary rehabilitation if told by your health care provider. If you  require home oxygen therapy, ask your health care provider whether you should purchase a pulse oximeter to measure your oxygen level at home. °Work with your health care provider to develop a COPD action plan. This will help you know what steps to take if your condition gets worse. °Keep other chronic health conditions under control as told by your health care provider. °Avoid extreme temperature and humidity changes. °Avoid contact with people who have an illness that spreads from person to person (is contagious), such as viral infections or pneumonia. °Keep all follow-up visits. This is important. °Contact a health care provider if: °You are coughing up more mucus than usual. °There is a change in the color or thickness of your mucus. °Your breathing is more labored than usual. °Your breathing is faster than usual. °You have difficulty sleeping. °You need to use your rescue medicines or inhalers more often than expected. °You have trouble doing routine activities such as getting dressed or walking around the house. °Get help right away if: °You have shortness of breath while you are resting. °You have shortness of breath that prevents you from: °Being able to talk. °Performing your usual physical activities. °You have chest pain lasting longer than 5 minutes. °Your skin color is more blue (cyanotic) than usual. °You measure low oxygen saturations for longer than 5 minutes with a pulse oximeter. °You have a fever. °You feel too tired to breathe normally. °These symptoms may represent a serious problem that is an emergency. Do not wait to see if the symptoms will go away. Get medical help right away. Call your local emergency services (911 in the [place] Do not drive yourself to the hospital. °Summary °Chronic obstructive pulmonary   disease (COPD) is a long-term (chronic) condition that affects the lungs. °Your lung function will probably never return to normal. In most cases, it gets worse over time. However, there  are steps you can take to slow the progression of the disease and improve your quality of life. °Treatment for COPD may include taking medicines, quitting smoking, pulmonary rehabilitation, and changes to diet and exercise. As the disease progresses, you may need oxygen therapy, a lung transplant, or palliative care. °To help manage your condition, do not smoke, avoid exposure to things that irritate your lungs, stay up to date on all vaccines, and follow your health care provider's instructions for taking medicines. °This information is not intended to replace advice given to you by your health care provider. Make sure you discuss any questions you have with your health care provider. °Document Revised: 05/12/2020 Document Reviewed: 05/12/2020 °Elsevier Patient Education © 2022 [employer] ° °

## 2021-05-28 ENCOUNTER — Telehealth: Payer: Self-pay | Admitting: Family

## 2021-05-28 LAB — CBC WITH DIFFERENTIAL/PLATELET
Basophils Absolute: 0 10*3/uL (ref 0.0–0.2)
Basos: 1 %
EOS (ABSOLUTE): 0.1 10*3/uL (ref 0.0–0.4)
Eos: 2 %
Hematocrit: 33.4 % — ABNORMAL LOW (ref 34.0–46.6)
Hemoglobin: 10.1 g/dL — ABNORMAL LOW (ref 11.1–15.9)
Immature Grans (Abs): 0 10*3/uL (ref 0.0–0.1)
Immature Granulocytes: 0 %
Lymphocytes Absolute: 1 10*3/uL (ref 0.7–3.1)
Lymphs: 14 %
MCH: 28.9 pg (ref 26.6–33.0)
MCHC: 30.2 g/dL — ABNORMAL LOW (ref 31.5–35.7)
MCV: 96 fL (ref 79–97)
Monocytes Absolute: 0.6 10*3/uL (ref 0.1–0.9)
Monocytes: 8 %
Neutrophils Absolute: 5.5 10*3/uL (ref 1.4–7.0)
Neutrophils: 75 %
Platelets: 292 10*3/uL (ref 150–450)
RBC: 3.49 x10E6/uL — ABNORMAL LOW (ref 3.77–5.28)
RDW: 16.1 % — ABNORMAL HIGH (ref 11.7–15.4)
WBC: 7.3 10*3/uL (ref 3.4–10.8)

## 2021-05-28 LAB — CMP14+EGFR
ALT: 17 IU/L (ref 0–32)
AST: 18 IU/L (ref 0–40)
Albumin/Globulin Ratio: 1.7 (ref 1.2–2.2)
Albumin: 3.6 g/dL — ABNORMAL LOW (ref 3.7–4.7)
Alkaline Phosphatase: 82 IU/L (ref 44–121)
BUN/Creatinine Ratio: 23 (ref 12–28)
BUN: 33 mg/dL — ABNORMAL HIGH (ref 8–27)
Bilirubin Total: 0.2 mg/dL (ref 0.0–1.2)
CO2: 26 mmol/L (ref 20–29)
Calcium: 8.9 mg/dL (ref 8.7–10.3)
Chloride: 104 mmol/L (ref 96–106)
Creatinine, Ser: 1.46 mg/dL — ABNORMAL HIGH (ref 0.57–1.00)
Globulin, Total: 2.1 g/dL (ref 1.5–4.5)
Glucose: 204 mg/dL — ABNORMAL HIGH (ref 70–99)
Potassium: 4.9 mmol/L (ref 3.5–5.2)
Sodium: 146 mmol/L — ABNORMAL HIGH (ref 134–144)
Total Protein: 5.7 g/dL — ABNORMAL LOW (ref 6.0–8.5)
eGFR: 37 mL/min/{1.73_m2} — ABNORMAL LOW (ref >59)

## 2021-05-28 NOTE — Telephone Encounter (Signed)
See Result note 

## 2021-05-28 NOTE — Telephone Encounter (Signed)
Calling about lab results. Please review and call back.

## 2021-05-31 LAB — HGB A1C W/O EAG: Hgb A1c MFr Bld: 5.1 % (ref 4.8–5.6)

## 2021-05-31 LAB — SPECIMEN STATUS REPORT

## 2021-06-07 ENCOUNTER — Other Ambulatory Visit: Payer: Self-pay | Admitting: Family

## 2021-06-08 NOTE — Telephone Encounter (Signed)
Ok to fill Vitamin D?

## 2021-06-09 ENCOUNTER — Inpatient Hospital Stay (HOSPITAL_COMMUNITY)
Admission: EM | Admit: 2021-06-09 | Discharge: 2021-06-12 | DRG: 871 | Disposition: A | Discharge status: Home / Self Care | Payer: Medicare Other | Attending: Internal Medicine | Admitting: Internal Medicine

## 2021-06-09 ENCOUNTER — Emergency Department (HOSPITAL_COMMUNITY): Payer: Medicare Other

## 2021-06-09 DIAGNOSIS — J181 Lobar pneumonia, unspecified organism: Secondary | ICD-10-CM | POA: Diagnosis not present

## 2021-06-09 DIAGNOSIS — R042 Hemoptysis: Secondary | ICD-10-CM | POA: Diagnosis present

## 2021-06-09 DIAGNOSIS — I4821 Permanent atrial fibrillation: Secondary | ICD-10-CM | POA: Diagnosis present

## 2021-06-09 DIAGNOSIS — J9620 Acute and chronic respiratory failure, unspecified whether with hypoxia or hypercapnia: Secondary | ICD-10-CM

## 2021-06-09 DIAGNOSIS — Y95 Nosocomial condition: Secondary | ICD-10-CM | POA: Diagnosis present

## 2021-06-09 DIAGNOSIS — Z7951 Long term (current) use of inhaled steroids: Secondary | ICD-10-CM

## 2021-06-09 DIAGNOSIS — J9611 Chronic respiratory failure with hypoxia: Secondary | ICD-10-CM | POA: Diagnosis not present

## 2021-06-09 DIAGNOSIS — Z79899 Other long term (current) drug therapy: Secondary | ICD-10-CM

## 2021-06-09 DIAGNOSIS — Z888 Allergy status to other drugs, medicaments and biological substances status: Secondary | ICD-10-CM

## 2021-06-09 DIAGNOSIS — R6520 Severe sepsis without septic shock: Secondary | ICD-10-CM | POA: Diagnosis present

## 2021-06-09 DIAGNOSIS — G9341 Metabolic encephalopathy: Secondary | ICD-10-CM | POA: Diagnosis not present

## 2021-06-09 DIAGNOSIS — N184 Chronic kidney disease, stage 4 (severe): Secondary | ICD-10-CM | POA: Diagnosis present

## 2021-06-09 DIAGNOSIS — I080 Rheumatic disorders of both mitral and aortic valves: Secondary | ICD-10-CM | POA: Diagnosis present

## 2021-06-09 DIAGNOSIS — Z87891 Personal history of nicotine dependence: Secondary | ICD-10-CM

## 2021-06-09 DIAGNOSIS — Z90710 Acquired absence of both cervix and uterus: Secondary | ICD-10-CM | POA: Diagnosis not present

## 2021-06-09 DIAGNOSIS — Z825 Family history of asthma and other chronic lower respiratory diseases: Secondary | ICD-10-CM

## 2021-06-09 DIAGNOSIS — I272 Pulmonary hypertension, unspecified: Secondary | ICD-10-CM | POA: Diagnosis present

## 2021-06-09 DIAGNOSIS — I5032 Chronic diastolic (congestive) heart failure: Secondary | ICD-10-CM | POA: Diagnosis present

## 2021-06-09 DIAGNOSIS — J439 Emphysema, unspecified: Secondary | ICD-10-CM | POA: Diagnosis present

## 2021-06-09 DIAGNOSIS — Z8542 Personal history of malignant neoplasm of other parts of uterus: Secondary | ICD-10-CM | POA: Diagnosis not present

## 2021-06-09 DIAGNOSIS — Z7901 Long term (current) use of anticoagulants: Secondary | ICD-10-CM

## 2021-06-09 DIAGNOSIS — I4891 Unspecified atrial fibrillation: Secondary | ICD-10-CM | POA: Diagnosis not present

## 2021-06-09 DIAGNOSIS — Z9981 Dependence on supplemental oxygen: Secondary | ICD-10-CM | POA: Diagnosis not present

## 2021-06-09 DIAGNOSIS — Z20822 Contact with and (suspected) exposure to covid-19: Secondary | ICD-10-CM | POA: Diagnosis present

## 2021-06-09 DIAGNOSIS — F32A Depression, unspecified: Secondary | ICD-10-CM | POA: Diagnosis present

## 2021-06-09 DIAGNOSIS — I130 Hypertensive heart and chronic kidney disease with heart failure and stage 1 through stage 4 chronic kidney disease, or unspecified chronic kidney disease: Secondary | ICD-10-CM | POA: Diagnosis present

## 2021-06-09 DIAGNOSIS — Z8249 Family history of ischemic heart disease and other diseases of the circulatory system: Secondary | ICD-10-CM

## 2021-06-09 DIAGNOSIS — J9621 Acute and chronic respiratory failure with hypoxia: Secondary | ICD-10-CM | POA: Diagnosis present

## 2021-06-09 DIAGNOSIS — E782 Mixed hyperlipidemia: Secondary | ICD-10-CM | POA: Diagnosis present

## 2021-06-09 DIAGNOSIS — J189 Pneumonia, unspecified organism: Secondary | ICD-10-CM | POA: Diagnosis present

## 2021-06-09 DIAGNOSIS — M1990 Unspecified osteoarthritis, unspecified site: Secondary | ICD-10-CM | POA: Diagnosis present

## 2021-06-09 DIAGNOSIS — A419 Sepsis, unspecified organism: Principal | ICD-10-CM

## 2021-06-09 DIAGNOSIS — Z841 Family history of disorders of kidney and ureter: Secondary | ICD-10-CM

## 2021-06-09 DIAGNOSIS — R791 Abnormal coagulation profile: Secondary | ICD-10-CM | POA: Diagnosis present

## 2021-06-09 LAB — CBC WITH DIFFERENTIAL/PLATELET
Abs Immature Granulocytes: 0.17 10*3/uL — ABNORMAL HIGH (ref 0.00–0.07)
Basophils Absolute: 0 10*3/uL (ref 0.0–0.1)
Basophils Relative: 0 %
Eosinophils Absolute: 0 10*3/uL (ref 0.0–0.5)
Eosinophils Relative: 0 %
HCT: 33.9 % — ABNORMAL LOW (ref 36.0–46.0)
Hemoglobin: 10.8 g/dL — ABNORMAL LOW (ref 12.0–15.0)
Immature Granulocytes: 1 %
Lymphocytes Relative: 4 %
Lymphs Abs: 0.8 10*3/uL (ref 0.7–4.0)
MCH: 31.1 pg (ref 26.0–34.0)
MCHC: 31.9 g/dL (ref 30.0–36.0)
MCV: 97.7 fL (ref 80.0–100.0)
Monocytes Absolute: 1.1 10*3/uL — ABNORMAL HIGH (ref 0.1–1.0)
Monocytes Relative: 6 %
Neutro Abs: 17.9 10*3/uL — ABNORMAL HIGH (ref 1.7–7.7)
Neutrophils Relative %: 89 %
Platelets: 162 10*3/uL (ref 150–400)
RBC: 3.47 MIL/uL — ABNORMAL LOW (ref 3.87–5.11)
RDW: 17 % — ABNORMAL HIGH (ref 11.5–15.5)
WBC: 20 10*3/uL — ABNORMAL HIGH (ref 4.0–10.5)
nRBC: 0 % (ref 0.0–0.2)

## 2021-06-09 LAB — COMPREHENSIVE METABOLIC PANEL
ALT: 27 U/L (ref 0–44)
AST: 24 U/L (ref 15–41)
Albumin: 3.2 g/dL — ABNORMAL LOW (ref 3.5–5.0)
Alkaline Phosphatase: 74 U/L (ref 38–126)
Anion gap: 12 (ref 5–15)
BUN: 30 mg/dL — ABNORMAL HIGH (ref 8–23)
CO2: 23 mmol/L (ref 22–32)
Calcium: 8.8 mg/dL — ABNORMAL LOW (ref 8.9–10.3)
Chloride: 100 mmol/L (ref 98–111)
Creatinine, Ser: 1.54 mg/dL — ABNORMAL HIGH (ref 0.44–1.00)
GFR, Estimated: 34 mL/min — ABNORMAL LOW (ref >60)
Glucose, Bld: 186 mg/dL — ABNORMAL HIGH (ref 70–99)
Potassium: 3.6 mmol/L (ref 3.5–5.1)
Sodium: 135 mmol/L (ref 135–145)
Total Bilirubin: 1.2 mg/dL (ref 0.3–1.2)
Total Protein: 6.4 g/dL — ABNORMAL LOW (ref 6.5–8.1)

## 2021-06-09 LAB — PROTIME-INR
INR: 2.7 — ABNORMAL HIGH (ref 0.8–1.2)
Prothrombin Time: 28.7 seconds — ABNORMAL HIGH (ref 11.4–15.2)

## 2021-06-09 LAB — RESP PANEL BY RT-PCR (FLU A&B, COVID) ARPGX2
Influenza A by PCR: NEGATIVE
Influenza B by PCR: NEGATIVE
SARS Coronavirus 2 by RT PCR: NEGATIVE

## 2021-06-09 LAB — URINALYSIS, ROUTINE W REFLEX MICROSCOPIC
Bilirubin Urine: NEGATIVE
Glucose, UA: NEGATIVE mg/dL
Hgb urine dipstick: NEGATIVE
Ketones, ur: NEGATIVE mg/dL
Leukocytes,Ua: NEGATIVE
Nitrite: NEGATIVE
Protein, ur: NEGATIVE mg/dL
Specific Gravity, Urine: 1.015 (ref 1.005–1.030)
pH: 5.5 (ref 5.0–8.0)

## 2021-06-09 LAB — LACTIC ACID, PLASMA
Lactic Acid, Venous: 4.2 mmol/L (ref 0.5–1.9)
Lactic Acid, Venous: 1.9 mmol/L (ref 0.5–1.9)
Lactic Acid, Venous: 2.3 mmol/L (ref 0.5–1.9)
Lactic Acid, Venous: 1.6 mmol/L (ref 0.5–1.9)

## 2021-06-09 LAB — APTT: aPTT: 44 seconds — ABNORMAL HIGH (ref 24–36)

## 2021-06-09 MED ORDER — LACTATED RINGERS IV BOLUS (SEPSIS): 250.0000 mL | Freq: Once | INTRAVENOUS | Status: DC

## 2021-06-09 MED ORDER — SODIUM CHLORIDE 0.9 % IV SOLN
2.0000 g | INTRAVENOUS | Status: DC
  Administered 2021-06-09: 2 g via INTRAVENOUS
  Filled 2021-06-09: qty 20

## 2021-06-09 MED ORDER — WARFARIN - PHARMACIST DOSING INPATIENT: Freq: Every day | Status: DC

## 2021-06-09 MED ORDER — LINEZOLID 600 MG/300ML IV SOLN
600.0000 mg | Freq: Two times a day (BID) | INTRAVENOUS | Status: DC
  Administered 2021-06-09 – 2021-06-10 (×3): 600 mg via INTRAVENOUS
  Filled 2021-06-09 (×11): qty 300

## 2021-06-09 MED ORDER — DIGOXIN 0.25 MG/ML IJ SOLN
0.2500 mg | INTRAMUSCULAR | Status: AC
  Administered 2021-06-09: 0.25 mg via INTRAVENOUS
  Filled 2021-06-09: qty 2

## 2021-06-09 MED ORDER — WARFARIN SODIUM 5 MG PO TABS
5.0000 mg | ORAL_TABLET | Freq: Once | ORAL | Status: AC
Start: 2021-06-09 — End: 2021-06-09
  Administered 2021-06-09: 5 mg via ORAL
  Filled 2021-06-09: qty 1

## 2021-06-09 MED ORDER — SODIUM CHLORIDE 0.9 % IV BOLUS
2500.0000 mL | Freq: Once | INTRAVENOUS | Status: AC
  Administered 2021-06-09: 2500 mL via INTRAVENOUS

## 2021-06-09 MED ORDER — SODIUM CHLORIDE 0.9 % IV SOLN
500.0000 mg | INTRAVENOUS | Status: DC
  Administered 2021-06-09 – 2021-06-10 (×2): 500 mg via INTRAVENOUS
  Filled 2021-06-09 (×2): qty 500

## 2021-06-09 MED ORDER — SODIUM CHLORIDE 0.9 % IV SOLN: 2.0000 g | INTRAVENOUS | Status: DC

## 2021-06-09 MED ORDER — DILTIAZEM LOAD VIA INFUSION
20.0000 mg | Freq: Once | INTRAVENOUS | Status: AC
  Administered 2021-06-09: 20 mg via INTRAVENOUS
  Filled 2021-06-09: qty 20

## 2021-06-09 MED ORDER — LACTATED RINGERS IV SOLN: INTRAVENOUS | Status: AC

## 2021-06-09 MED ORDER — ACETAMINOPHEN 325 MG PO TABS
650.0000 mg | ORAL_TABLET | Freq: Four times a day (QID) | ORAL | Status: DC | PRN
  Administered 2021-06-09: 650 mg via ORAL
  Administered 2021-06-10: 325 mg via ORAL
  Administered 2021-06-10 – 2021-06-11 (×3): 650 mg via ORAL
  Filled 2021-06-09 (×5): qty 2

## 2021-06-09 MED ORDER — ACETAMINOPHEN 500 MG PO TABS
1000.0000 mg | ORAL_TABLET | Freq: Once | ORAL | Status: AC
  Administered 2021-06-09: 1000 mg via ORAL
  Filled 2021-06-09: qty 2

## 2021-06-09 MED ORDER — PANTOPRAZOLE SODIUM 40 MG PO TBEC
40.0000 mg | DELAYED_RELEASE_TABLET | Freq: Every day | ORAL | Status: DC
  Administered 2021-06-10 – 2021-06-12 (×3): 40 mg via ORAL
  Filled 2021-06-09 (×3): qty 1

## 2021-06-09 MED ORDER — LACTATED RINGERS IV BOLUS (SEPSIS): 1000.0000 mL | Freq: Once | INTRAVENOUS | Status: DC

## 2021-06-09 MED ORDER — ACETAMINOPHEN 650 MG RE SUPP: 650.0000 mg | Freq: Four times a day (QID) | RECTAL | Status: DC | PRN

## 2021-06-09 MED ORDER — METOPROLOL SUCCINATE ER 50 MG PO TB24
50.0000 mg | ORAL_TABLET | Freq: Every evening | ORAL | Status: DC
  Administered 2021-06-09 – 2021-06-11 (×3): 50 mg via ORAL
  Filled 2021-06-09 (×3): qty 1

## 2021-06-09 MED ORDER — ALPRAZOLAM 0.25 MG PO TABS
0.2500 mg | ORAL_TABLET | Freq: Every evening | ORAL | Status: DC | PRN
  Administered 2021-06-09 – 2021-06-11 (×3): 0.25 mg via ORAL
  Filled 2021-06-09 (×3): qty 1

## 2021-06-09 MED ORDER — VANCOMYCIN HCL 1000 MG/200ML IV SOLN: 1000.0000 mg | Freq: Once | INTRAVENOUS | Status: DC

## 2021-06-09 MED ORDER — LEVALBUTEROL HCL 0.63 MG/3ML IN NEBU
0.6300 mg | INHALATION_SOLUTION | Freq: Four times a day (QID) | RESPIRATORY_TRACT | Status: DC | PRN
  Administered 2021-06-09: 0.63 mg via RESPIRATORY_TRACT
  Filled 2021-06-09: qty 3

## 2021-06-09 MED ORDER — DILTIAZEM HCL-DEXTROSE 125-5 MG/125ML-% IV SOLN (PREMIX)
5.0000 mg/h | INTRAVENOUS | Status: DC
  Administered 2021-06-09: 5 mg/h via INTRAVENOUS
  Filled 2021-06-09: qty 125

## 2021-06-09 MED ORDER — DILTIAZEM HCL ER COATED BEADS 180 MG PO CP24
180.0000 mg | ORAL_CAPSULE | Freq: Every day | ORAL | Status: DC
  Administered 2021-06-10 – 2021-06-12 (×3): 180 mg via ORAL
  Filled 2021-06-09 (×3): qty 1

## 2021-06-09 MED ORDER — SODIUM CHLORIDE 0.9 % IV SOLN
2.0000 g | Freq: Once | INTRAVENOUS | Status: AC
  Administered 2021-06-09: 2 g via INTRAVENOUS
  Filled 2021-06-09: qty 2

## 2021-06-09 MED ORDER — ATORVASTATIN CALCIUM 40 MG PO TABS
80.0000 mg | ORAL_TABLET | Freq: Every day | ORAL | Status: DC
  Administered 2021-06-10 – 2021-06-12 (×3): 80 mg via ORAL
  Filled 2021-06-09 (×3): qty 2

## 2021-06-09 NOTE — ED Provider Notes (Signed)
[facility] Provider Note   CSN: [id number] Arrival date & time: 06/09/21  1618     History Chief Complaint  Patient presents with   Altered Mental Status    Melinda Collins is a 80 y.o. female.  Pt presents to the ED today with sob, cp, and AMS.  Pt's daughter-in-law gives the hx.  Pt started getting sick yesterday.  She spit out her meds last night and today.  The pt did not get out of bed at all today.  She is normally alert and active.  She does have a hx of COPD and is on 3-4L oxygen at all times.      Past Medical History:  Diagnosis Date   Aortic stenosis    Arthritis    Chronic hypoxemic respiratory failure (HCC)    COPD (chronic obstructive pulmonary disease) (HCC)    Depression    HOH (hard of hearing)    Mitral regurgitation    Mixed hyperlipidemia    Permanent atrial fibrillation (HCC)    Ruptured disk    Uterine cancer [facility])     Patient Active Problem List   Diagnosis Date Noted   Sepsis ([place]) 06/09/2021   Palliative care patient 05/27/2021   COPD exacerbation ([place])    Acute on chronic respiratory failure ([place]) 04/25/2021   COPD with acute exacerbation ([place]) 04/25/2021   Hyperkalemia 04/25/2021   GAD (generalized anxiety disorder) 04/13/2021   Pulmonary emphysema ([place]) 08/24/2020   Vitamin D deficiency 06/21/2019   Depression, recurrent ([place]) 06/21/2019   Lumbar spondylosis with myelopathy 09/11/2017   Pulmonary nodules 02/08/2017   Aortic atherosclerosis ([place]) 04/22/2016   Permanent atrial fibrillation ([place]) 40/98/1191   Metabolic syndrome 47/82/9562   History of uterine cancer 12/03/2013   Chronic midline low back pain without sciatica 05/23/2013   High risk medication use 05/23/2013   Swelling of limb 05/03/2013   Hyperlipidemia 02/10/2009   TOBACCO ABUSE 02/10/2009   Pulmonary hypertension ([place]) 02/10/2009   Atrial fibrillation with RVR ([place]) 02/10/2009    Past Surgical History:  Procedure Laterality Date    ABDOMINAL HYSTERECTOMY     total   CATARACT EXTRACTION W/PHACO Left 08/25/2014   Procedure: CATARACT EXTRACTION PHACO AND INTRAOCULAR LENS PLACEMENT ([place]);  Surgeon: Tonny Branch, MD;  Location: [facility];  Service: Ophthalmology;  Laterality: Left;  CDE 6.95   CATARACT EXTRACTION W/PHACO Right 09/11/2014   Procedure: CATARACT EXTRACTION PHACO AND INTRAOCULAR LENS PLACEMENT ([facility]);  Surgeon: Tonny Branch, MD;  Location: [facility];  Service: Ophthalmology;  Laterality: Right;  CDE:8.11   CERVICAL DISC SURGERY     x2   KYPHOPLASTY     SPINE SURGERY  2013   cervical fusion     OB History   No obstetric history on file.     Family History  Problem Relation Age of Onset   Heart disease Mother    Stroke Mother    Diabetes Mother    Heart attack Father    Kidney disease Father    Cancer Father        prostate   Cancer Brother    Heart attack Brother 2   Cancer Brother        prostate   Heart disease Brother        CHF   COPD Brother    Coronary artery disease Other        fhx    Social History   Tobacco Use   Smoking status: Former  Packs/day: 1.00    Types: Cigarettes    Start date: 07/18/1960   Smokeless tobacco: Never   Tobacco comments:    Qut last week.  08/10/16 restarted smoking cigarettes  Vaping Use   Vaping Use: Never used  Substance Use Topics   Alcohol use: No    Alcohol/week: 0.0 standard drinks   Drug use: No    Home Medications Prior to Admission medications   Medication Sig Start Date End Date Taking? Authorizing Provider  Vitamin D, Ergocalciferol, (DRISDOL) 1.25 MG (50000 UNIT) CAPS capsule TAKE ONE CAPSULE BY MOUTH EVERY 7 DAYS. (SUNDAY MORNING) 06/08/21   [name], FNP  acetaminophen (TYLENOL) 325 MG tablet Take 650 mg by mouth every 6 (six) hours as needed for moderate pain.    [provider]  albuterol (VENTOLIN HFA) 108 (90 Base) MCG/ACT inhaler INHALE 1-2 PUFFS INTO THE LUNGS EVERY 4 TO 6 HOURS AS NEEDED FOR SHORTNESS OF BREATH   (NEEDS TO BE SEEN BEFORE NEXT REFILL) 04/13/21   [name], FNP  ALPRAZolam Duanne Moron) 0.25 MG tablet Take 1 tablet (0.25 mg total) by mouth at bedtime as needed for anxiety. 05/27/21   [name], FNP  atorvastatin (LIPITOR) 80 MG tablet TAKE ONE TABLET BY MOUTH DAILY AT 6 PM 05/19/21   Hawks, [name] A, FNP  busPIRone (BUSPAR) 5 MG tablet Take 1 tablet (5 mg total) by mouth 3 (three) times daily as needed. 04/13/21   [name], FNP  diazepam (VALIUM) 5 MG tablet Take 5 mg by mouth every 8 (eight) hours as needed for anxiety. 04/16/21   [provider]  diclofenac sodium (VOLTAREN) 1 % GEL Apply 2 g topically 4 (four) times daily. 05/20/19   Baruch Gouty, FNP  diltiazem (CARDIZEM CD) 180 MG 24 hr capsule Take 1 capsule (180 mg total) by mouth daily. 05/11/21 06/10/21  [name], FNP  DULoxetine (CYMBALTA) 60 MG capsule Take 1 capsule (60 mg total) by mouth See admin instructions. Take 120 mg by mouth every evening 05/03/21   Arrien, Jimmy Picket, MD  esomeprazole (NEXIUM) 40 MG capsule TAKE ONE CAPSULE BY MOUTH ONCE DAILY (MORNING PER PT) 04/14/21   [name], FNP  fluticasone (FLONASE) 50 MCG/ACT nasal spray USE ONE SPRAY IN EACH NOSTRIL ONCE DAILY. SHAKE GENTLY BEFORE USING. 05/19/21   [name], FNP  Fluticasone-Umeclidin-Vilant (TRELEGY ELLIPTA) 100-62.5-25 MCG/INH AEPB Inhale 1 puff into the lungs daily. 04/13/21   [name], FNP  guaiFENesin (MUCINEX) 600 MG 12 hr tablet Take 1 tablet (600 mg total) by mouth 2 (two) times daily as needed. 08/10/16   [name], MD  levalbuterol Penne Lash) 0.63 MG/3ML nebulizer solution Take 3 mLs (0.63 mg total) by nebulization every 6 (six) hours as needed for wheezing or shortness of breath. 05/27/21   [name], FNP  loratadine (ALLERGY RELIEF) 10 MG tablet TAKE ONE TABLET BY MOUTH EVERY DAY (,EVENING) 04/26/21   [name], FNP  magic mouthwash (nystatin, lidocaine, diphenhydrAMINE)  suspension Take 5 mLs by mouth 4 (four) times daily as needed for mouth pain. 05/10/21   [name], FNP  Magnesium Hydroxide (DULCOLAX PO) Take 25 mg by mouth daily.    [provider]  metoprolol succinate (TOPROL-XL) 50 MG 24 hr tablet Take 1 tablet (50 mg total) by mouth every evening. Take with or immediately following a meal. 05/11/21 06/10/21  [name], FNP  Multiple Vitamins-Minerals (CENTRUM SILVER PO) Take 1 tablet  by mouth daily.    [provider]  NON FORMULARY Inhale 5 L into the lungs daily. Oxygen 5 to 6 L every day    [provider]  nystatin (MYCOSTATIN/NYSTOP) powder Apply 1 application topically 3 (three) times daily. 05/10/21   [name], FNP  nystatin cream (MYCOSTATIN) Apply 1 application topically 2 (two) times daily. 05/10/21   [name], FNP  Spacer/Aero-Holding Chambers DEVI 1 Device by Does not apply route in the morning and at bedtime. 04/13/21   [name], FNP  torsemide (DEMADEX) 20 MG tablet TAKE 1 AND 1/2 TABLETS TO 2 TABLETS BY MOUTH EVERY MORNING, 1& 1/2 IN PACK EXTRA IN BOTTLE IF NEEDED 06/08/21   [name], FNP  vitamin B-12 (CYANOCOBALAMIN) 1000 MCG tablet TAKE ONE TABLET BY MOUTH DAILY WITH BREAKFAST (MORNING) Patient taking differently: Take 1,000 mcg by mouth daily. 04/14/21   [name], FNP  warfarin (COUMADIN) 5 MG tablet TAKE 1 OR 2 TABLETS BY MOUTH AS DIRECTED BY WARFARIN CLINIC (,1-SU,M,W,TH,F,SA/ ,1 &,1/2-TU) 04/14/21   [name], FNP    Allergies    Baclofen, Niaspan [niacin], and Flexeril [cyclobenzaprine]  Review of Systems   Review of Systems  Constitutional:  Positive for activity change.  Respiratory:  Positive for cough.   Neurological:  Positive for weakness.  All other systems reviewed and are negative.  Physical Exam Updated Vital Signs BP (!) 81/49   Pulse (!) 111   Temp (!) 103 F (39.4 C) (Rectal)   Resp 18   SpO2 99%   Physical Exam Vitals and  nursing note reviewed.  Constitutional:      Appearance: She is ill-appearing.  HENT:     Head: Normocephalic and atraumatic.     Right Ear: External ear normal.     Left Ear: External ear normal.     Nose: Nose normal.     Mouth/Throat:     Mouth: Mucous membranes are dry.  Eyes:     Extraocular Movements: Extraocular movements intact.     Conjunctiva/sclera: Conjunctivae normal.     Pupils: Pupils are equal, round, and reactive to light.  Cardiovascular:     Rate and Rhythm: Tachycardia present. Rhythm irregular.     Pulses: Normal pulses.     Heart sounds: Normal heart sounds.  Pulmonary:     Breath sounds: Wheezing and rhonchi present.  Abdominal:     General: Abdomen is flat. Bowel sounds are normal.     Palpations: Abdomen is soft.  Musculoskeletal:        General: Normal range of motion.     Cervical back: Normal range of motion and neck supple.  Skin:    General: Skin is warm.     Capillary Refill: Capillary refill takes less than 2 seconds.  Neurological:     Mental Status: She is alert. She is disoriented.  Psychiatric:     Comments: Unable to assess due to MS    ED Results / Procedures / Treatments   Labs (all labs ordered are listed, but only abnormal results are displayed) Labs Reviewed  LACTIC ACID, PLASMA - Abnormal; Notable for the following components:      Result Value   Lactic Acid, Venous 4.2 (*)    All other components within normal limits  COMPREHENSIVE METABOLIC PANEL - Abnormal; Notable for the following components:   Glucose, Bld 186 (*)    BUN 30 (*)    Creatinine, Ser 1.54 (*)  Calcium 8.8 (*)    Total Protein 6.4 (*)    Albumin 3.2 (*)    GFR, Estimated 34 (*)    All other components within normal limits  CBC WITH DIFFERENTIAL/PLATELET - Abnormal; Notable for the following components:   WBC 20.0 (*)    RBC 3.47 (*)    Hemoglobin 10.8 (*)    HCT 33.9 (*)    RDW 17.0 (*)    Neutro Abs 17.9 (*)    Monocytes Absolute 1.1 (*)    Abs  Immature Granulocytes 0.17 (*)    All other components within normal limits  PROTIME-INR - Abnormal; Notable for the following components:   Prothrombin Time 28.7 (*)    INR 2.7 (*)    All other components within normal limits  APTT - Abnormal; Notable for the following components:   aPTT 44 (*)    All other components within normal limits  RESP PANEL BY RT-PCR (FLU A&B, COVID) ARPGX2  CULTURE, BLOOD (ROUTINE X 2)  CULTURE, BLOOD (ROUTINE X 2)  URINE CULTURE  URINALYSIS, ROUTINE W REFLEX MICROSCOPIC  LACTIC ACID, PLASMA    EKG EKG Interpretation  Date/Time:  Wednesday June 09 2021 16:33:40 EST Ventricular Rate:  152 PR Interval:  68 QRS Duration: 130 QT Interval:  297 QTC Calculation: 473 R Axis:   -65 Text Interpretation: afib with rvr Since last tracing rate faster Confirmed by Isla Pence [phone]) on 06/09/2021 5:24:10 PM  Radiology DG Chest Port 1 View  Result Date: 06/09/2021 CLINICAL DATA:  Questionable sepsis EXAM: PORTABLE CHEST 1 VIEW COMPARISON:  Portable exam 6213 hours compared to 05/02/2021 FINDINGS: Enlargement of cardiac silhouette. Atherosclerotic calcification aorta. Progressive airspace consolidation in RIGHT upper lobe, less in the mid to lower LEFT lung and at RIGHT base. Associated pleural effusion/thickening in the RIGHT chest. No pneumothorax. Bones demineralized. IMPRESSION: Enlargement of cardiac silhouette. BILATERAL pulmonary infiltrates progressive in RIGHT upper lobe, favor pneumonia. Radiographic follow-up until resolution recommended; if these fail to resolve or if patient has worsening symptoms, recommend CT assessment as these infiltrates have been present for greater than 1 month. Electronically Signed   By: Lavonia Dana M.D.   On: 06/09/2021 17:29    Procedures Procedures   Medications Ordered in ED Medications  lactated ringers infusion ( Intravenous Not Given 06/09/21 1734)  cefTRIAXone (ROCEPHIN) 2 g in sodium chloride 0.9 % 100 mL  IVPB (2 g Intravenous New Bag/Given 06/09/21 1825)  azithromycin (ZITHROMAX) 500 mg in sodium chloride 0.9 % 250 mL IVPB (0 mg Intravenous Stopped 06/09/21 1820)  diltiazem (CARDIZEM) 1 mg/mL load via infusion 20 mg (20 mg Intravenous Bolus from Bag 06/09/21 1724)    And  diltiazem (CARDIZEM) 125 mg in dextrose 5% 125 mL (1 mg/mL) infusion (7.5 mg/hr Intravenous Rate/Dose Change 06/09/21 1821)  acetaminophen (TYLENOL) tablet 1,000 mg (1,000 mg Oral Given 06/09/21 1723)  sodium chloride 0.9 % bolus 2,500 mL (0 mLs Intravenous Stopped 06/09/21 1827)    ED Course  I have reviewed the triage vital signs and the nursing notes.  Pertinent labs & imaging results that were available during my care of the patient were reviewed by me and considered in my medical decision making (see chart for details).    MDM Rules/Calculators/A&P                           Code sepsis called upon arrival.  Pt given tylenol and rocephin/zithromax and sepsis fluids.  She was  given NS instead of LR because the LR is not compatible with abx or cardizem.  CXR does show pna.  Covid/flu neg.  Pt is also in afib with RVR and is started on cardizem.  HR has improved, but is still elevated.  MS has improved with treatment and she is much more alert.    Pt d/w Dr. [facility] (triad) for admission.  [name] G Oliver was evaluated in Emergency Department on 06/09/2021 for the symptoms described in the history of present illness. She was evaluated in the context of the global COVID-19 pandemic, which necessitated consideration that the patient might be at risk for infection with the SARS-CoV-2 virus that causes COVID-19. Institutional protocols and algorithms that pertain to the evaluation of patients at risk for COVID-19 are in a state of rapid change based on information released by regulatory bodies including the CDC and federal and state organizations. These policies and algorithms were followed during the patient's care in  the ED.   CRITICAL CARE Performed by: Isla Pence   Total critical care time: 45 minutes  Critical care time was exclusive of separately billable procedures and treating other patients.  Critical care was necessary to treat or prevent imminent or life-threatening deterioration.  Critical care was time spent personally by me on the following activities: development of treatment plan with patient and/or surrogate as well as nursing, discussions with consultants, evaluation of patient's response to treatment, examination of patient, obtaining history from patient or surrogate, ordering and performing treatments and interventions, ordering and review of laboratory studies, ordering and review of radiographic studies, pulse oximetry and re-evaluation of patient's condition.  Final Clinical Impression(s) / ED Diagnoses Final diagnoses:  Sepsis, due to unspecified organism, unspecified whether acute organ dysfunction present ([place])  Pneumonia of both lungs due to infectious organism, unspecified part of lung  Metabolic encephalopathy  Atrial fibrillation with RVR [facility])    Rx / DC Orders ED Discharge Orders     None        Isla Pence, MD 06/09/21 1840

## 2021-06-09 NOTE — H&P (Signed)
TRH H&P   Patient Demographics:    Melinda Collins, is a 80 y.o. female  MRN: [id number]   DOB - 08-Oct-1940  Admit Date - 06/09/2021  Outpatient Primary MD for the patient is [name], FNP  Referring MD/NP/PA: Dr Gilford Raid   Patient coming from: home  Chief Complaint  Patient presents with   Altered Mental Status      HPI:    Melinda Collins  is a 80 y.o. female, with past medical history for COPD, chronic hypoxic respiratory failure (4 to 5 L of oxygen per nasal cannula at home), pulmonary hypertension, diastolic heart failure, hypertension, and atrial fibrillation on warfarin, patient was brought by her daughter for altered mental status, she did report some dyspnea, chest pain, patient daughter at bedside gives the history, patient has been feeling sick since yesterday, and she has been refusing her medication including Cardizem yesterday and today, she is normally alert and active. - in ED patient was febrile at 103, hypotensive, and A. fib with RVR, elevated lactic acid at 4.2, with leukocytosis of 20, chest x-ray significant for pneumonia, she was started on Cardizem drip, broad-spectrum antibiotics, and [facility] hospital[facility] consulted to admit.    Review of systems:    In addition to the HPI above,  Reports fever and chills  No Headache, No changes with Vision or hearing, No problems swallowing food or Liquids, she reports shortness of breath and cough No Abdominal pain, No Nausea or Vommitting, Bowel movements are regular, No Blood in stool or Urine, No dysuria, No new skin rashes or bruises, No new joints pains-aches,  No new weakness, tingling, numbness in any extremity, No recent weight gain or loss, No polyuria, polydypsia or polyphagia, No significant Mental Stressors.  A full 10 point Review of Systems was done, except as stated above, all other  Review of Systems were negative.   With Past History of the following :    Past Medical History:  Diagnosis Date   Aortic stenosis    Arthritis    Chronic hypoxemic respiratory failure (HCC)    COPD (chronic obstructive pulmonary disease) (HCC)    Depression    HOH (hard of hearing)    Mitral regurgitation    Mixed hyperlipidemia    Permanent atrial fibrillation (HCC)    Ruptured disk    Uterine cancer [facility])       Past Surgical History:  Procedure Laterality Date   ABDOMINAL HYSTERECTOMY     total   CATARACT EXTRACTION W/PHACO Left 08/25/2014   Procedure: CATARACT EXTRACTION PHACO AND INTRAOCULAR LENS PLACEMENT ([place]);  Surgeon: Tonny Branch, MD;  Location: [facility];  Service: Ophthalmology;  Laterality: Left;  CDE 6.95   CATARACT EXTRACTION W/PHACO Right 09/11/2014   Procedure: CATARACT EXTRACTION PHACO AND INTRAOCULAR LENS PLACEMENT ([facility]);  Surgeon: Tonny Branch, MD;  Location: [facility];  Service: Ophthalmology;  Laterality: Right;  CDE:8.11  CERVICAL DISC SURGERY     x2   KYPHOPLASTY     SPINE SURGERY  2013   cervical fusion      Social History:     Social History   Tobacco Use   Smoking status: Former    Packs/day: 1.00    Types: Cigarettes    Start date: 07/18/1960   Smokeless tobacco: Never   Tobacco comments:    Qut last week.  08/10/16 restarted smoking cigarettes  Substance Use Topics   Alcohol use: No    Alcohol/week: 0.0 standard drinks       Family History :     Family History  Problem Relation Age of Onset   Heart disease Mother    Stroke Mother    Diabetes Mother    Heart attack Father    Kidney disease Father    Cancer Father        prostate   Cancer Brother    Heart attack Brother 109   Cancer Brother        prostate   Heart disease Brother        CHF   COPD Brother    Coronary artery disease Other        fhx     Home Medications:   Prior to Admission medications   Medication Sig Start Date End Date Taking? Authorizing Provider   acetaminophen (TYLENOL) 325 MG tablet Take 650 mg by mouth every 6 (six) hours as needed for moderate pain.   Yes [provider]  ALPRAZolam (XANAX) 0.25 MG tablet Take 1 tablet (0.25 mg total) by mouth at bedtime as needed for anxiety. 05/27/21  Yes Hawks, Christy A, FNP  atorvastatin (LIPITOR) 80 MG tablet TAKE ONE TABLET BY MOUTH DAILY AT 6 PM Patient taking differently: Take 80 mg by mouth daily. 05/19/21  Yes Hawks, Christy A, FNP  bismuth subsalicylate (PEPTO BISMOL) 262 MG/15ML suspension Take 30 mLs by mouth every 6 (six) hours as needed.   Yes [provider]  diclofenac sodium (VOLTAREN) 1 % GEL Apply 2 g topically 4 (four) times daily. 05/20/19  Yes Rakes, Connye Burkitt, FNP  diltiazem (CARDIZEM CD) 180 MG 24 hr capsule Take 1 capsule (180 mg total) by mouth daily. 05/11/21 06/10/21 Yes Hawks, Christy A, FNP  DULoxetine (CYMBALTA) 60 MG capsule Take 1 capsule (60 mg total) by mouth See admin instructions. Take 120 mg by mouth every evening Patient taking differently: Take 60 mg by mouth 2 (two) times daily. Take 120 mg by mouth every evening 05/03/21  Yes Arrien, Jimmy Picket, MD  esomeprazole (NEXIUM) 40 MG capsule TAKE ONE CAPSULE BY MOUTH ONCE DAILY (MORNING PER PT) 04/14/21  Yes Hawks, Christy A, FNP  fluticasone (FLONASE) 50 MCG/ACT nasal spray USE ONE SPRAY IN EACH NOSTRIL ONCE DAILY. SHAKE GENTLY BEFORE USING. 05/19/21  Yes Hawks, Christy A, FNP  Fluticasone-Umeclidin-Vilant (TRELEGY ELLIPTA) 100-62.5-25 MCG/INH AEPB Inhale 1 puff into the lungs daily. 04/13/21  Yes Hawks, Christy A, FNP  guaiFENesin (MUCINEX) 600 MG 12 hr tablet Take 1 tablet (600 mg total) by mouth 2 (two) times daily as needed. 08/10/16  Yes [name], MD  levalbuterol Penne Lash) 0.63 MG/3ML nebulizer solution Take 3 mLs (0.63 mg total) by nebulization every 6 (six) hours as needed for wheezing or shortness of breath. 05/27/21  Yes Hawks, [name] A, FNP  loratadine (ALLERGY RELIEF) 10 MG tablet  TAKE ONE TABLET BY MOUTH EVERY DAY (,EVENING) 04/26/21  Yes [name], FNP  Magnesium  Hydroxide (DULCOLAX PO) Take 25 mg by mouth daily.   Yes [provider]  metoprolol succinate (TOPROL-XL) 50 MG 24 hr tablet Take 1 tablet (50 mg total) by mouth every evening. Take with or immediately following a meal. 05/11/21 06/10/21 Yes Hawks, Christy A, FNP  NON FORMULARY Inhale 2-4 L into the lungs daily. oxygen   Yes [provider]  nystatin (MYCOSTATIN/NYSTOP) powder Apply 1 application topically 3 (three) times daily. 05/10/21  Yes Hawks, Christy A, FNP  nystatin cream (MYCOSTATIN) Apply 1 application topically 2 (two) times daily. 05/10/21  Yes Hawks, Christy A, FNP  torsemide (DEMADEX) 20 MG tablet TAKE 1 AND 1/2 TABLETS TO 2 TABLETS BY MOUTH EVERY MORNING, 1& 1/2 IN PACK EXTRA IN BOTTLE IF NEEDED Patient taking differently: TAKE 1 AND 1/2 TABLETS TABLETS BY MOUTH EVERY MORNING, 1& 1/2 IN PACK EXTRA IN BOTTLE IF NEEDED 06/08/21  Yes Hawks, Christy A, FNP  vitamin B-12 (CYANOCOBALAMIN) 1000 MCG tablet TAKE ONE TABLET BY MOUTH DAILY WITH BREAKFAST (MORNING) Patient taking differently: Take 1,000 mcg by mouth daily. 04/14/21  Yes Hawks, Christy A, FNP  Vitamin D, Ergocalciferol, (DRISDOL) 1.25 MG (50000 UNIT) CAPS capsule TAKE ONE CAPSULE BY MOUTH EVERY 7 DAYS. (SUNDAY MORNING) Patient taking differently: Take 50,000 Units by mouth every 7 (seven) days. Mondays 06/08/21  Yes Hawks, Christy A, FNP  warfarin (COUMADIN) 5 MG tablet TAKE 1 OR 2 TABLETS BY MOUTH AS DIRECTED BY WARF[facility]IN CLINIC (,1-SU,M,W,TH,F,SA/ ,1 &,1/2-TU) Patient taking differently: [place] (,1-SU,M,W,TH,F,SA/ ,1 & 1/2-TU) 04/14/21  Yes Hawks, Christy A, FNP  albuterol (VENTOLIN HFA) 108 (90 Base) MCG/ACT inhaler INHALE 1-2 PUFFS INTO THE LUNGS EVERY 4 TO 6 HOURS AS NEEDED FOR SHORTNESS OF BREATH  (NEEDS TO BE SEEN BEFORE NEXT REFILL) Patient not taking: Reported on 06/09/2021 04/13/21   [name], FNP   busPIRone (BUSPAR) 5 MG tablet Take 1 tablet (5 mg total) by mouth 3 (three) times daily as needed. Patient not taking: Reported on 06/09/2021 04/13/21   [name], FNP  diazepam (VALIUM) 5 MG tablet Take 5 mg by mouth every 8 (eight) hours as needed for anxiety. 04/16/21   [provider]  magic mouthwash (nystatin, lidocaine, diphenhydrAMINE) suspension Take 5 mLs by mouth 4 (four) times daily as needed for mouth pain. 05/10/21   [name], FNP  Multiple Vitamins-Minerals (CENTRUM SILVER PO) Take 1 tablet by mouth daily. Patient not taking: Reported on 06/09/2021    [provider]  Spacer/Aero-Holding Chambers DEVI 1 Device by Does not apply route in the morning and at bedtime. 04/13/21   [name], FNP     Allergies:     Allergies  Allergen Reactions   Baclofen     Feels crazy   Niaspan [Niacin]    Flexeril [Cyclobenzaprine]     Feels crazy     Physical Exam:   Vitals  Blood pressure (!) 81/49, pulse (!) 111, temperature (!) 103 F (39.4 C), temperature source Rectal, resp. rate 18, SpO2 99 %.   1. General frail elderly female, laying in bed in no apparent distress  2. Normal affect and insight, Not Suicidal or Homicidal, Awake Alert, Oriented X 3.  3. No F.N deficits, ALL C.Nerves Intact, Strength 5/5 all 4 extremities, Sensation intact all 4 extremities, Plantars down going.  4. Ears and Eyes appear Normal, Conjunctivae clear, PERRLA. Moist Oral Mucosa.  5. Supple Neck, No JVD, No cervical lymphadenopathy appriciated, No Carotid Bruits.  6. Symmetrical Chest  wall movement, Good air movement bilaterally, scattered  Rales.  7.  Regular regular, tachycardic, No Gallops, Rubs or Murmurs, No Parasternal Heave.  8. Positive Bowel Sounds, Abdomen Soft, No tenderness, No organomegaly appriciated,No rebound -guarding or rigidity.  9.  No Cyanosis, Normal Skin Turgor, No Skin Rash or Bruise.  10. Good muscle tone,  joints appear normal  , no effusions, Normal ROM.  11. No Palpable Lymph Nodes in Neck or Axillae     Data Review:    CBC Recent Labs  Lab 06/09/21 1640  WBC 20.0*  HGB 10.8*  HCT 33.9*  PLT 162  MCV 97.7  MCH 31.1  MCHC 31.9  RDW 17.0*  LYMPHSABS 0.8  MONOABS 1.1*  EOSABS 0.0  BASOSABS 0.0   ------------------------------------------------------------------------------------------------------------------  Chemistries  Recent Labs  Lab 06/09/21 1640  NA 135  K 3.6  CL 100  CO2 23  GLUCOSE 186*  BUN 30*  CREATININE 1.54*  CALCIUM 8.8*  AST 24  ALT 27  ALKPHOS 74  BILITOT 1.2   ------------------------------------------------------------------------------------------------------------------ estimated creatinine clearance is 28.1 mL/min (A) (by C-G formula based on SCr of 1.54 mg/dL (H)). ------------------------------------------------------------------------------------------------------------------ No results for input(s): TSH, T4TOTAL, T3FREE, THYROIDAB in the last 72 hours.  Invalid input(s): FREET3  Coagulation profile Recent Labs  Lab 06/09/21 1640  INR 2.7*   ------------------------------------------------------------------------------------------------------------------- No results for input(s): DDIMER in the last 72 hours. -------------------------------------------------------------------------------------------------------------------  Cardiac Enzymes No results for input(s): CKMB, TROPONINI, MYOGLOBIN in the last 168 hours.  Invalid input(s): CK ------------------------------------------------------------------------------------------------------------------    Component Value Date/Time   BNP 527.0 (H) 05/01/2021 0342     ---------------------------------------------------------------------------------------------------------------  Urinalysis    Component Value Date/Time   COLORURINE YELLOW 06/09/2021 1649   APPEARANCEUR CLEAR 06/09/2021 1649    LABSPEC 1.015 06/09/2021 1649   PHURINE 5.5 06/09/2021 1649   GLUCOSEU NEGATIVE 06/09/2021 1649   HGBUR NEGATIVE 06/09/2021 1649   BILIRUBINUR NEGATIVE 06/09/2021 1649   KETONESUR NEGATIVE 06/09/2021 1649   PROTEINUR NEGATIVE 06/09/2021 1649   NITRITE NEGATIVE 06/09/2021 1649   LEUKOCYTESUR NEGATIVE 06/09/2021 1649    ----------------------------------------------------------------------------------------------------------------   Imaging Results:    DG Chest Port 1 View  Result Date: 06/09/2021 CLINICAL DATA:  Questionable sepsis EXAM: PORTABLE CHEST 1 VIEW COMPARISON:  Portable exam 1716 hours compared to 05/02/2021 FINDINGS: Enlargement of cardiac silhouette. Atherosclerotic calcification aorta. Progressive airspace consolidation in RIGHT upper lobe, less in the mid to lower LEFT lung and at RIGHT base. Associated pleural effusion/thickening in the RIGHT chest. No pneumothorax. Bones demineralized. IMPRESSION: Enlargement of cardiac silhouette. BILATERAL pulmonary infiltrates progressive in RIGHT upper lobe, favor pneumonia. Radiographic follow-up until resolution recommended; if these fail to resolve or if patient has worsening symptoms, recommend CT assessment as these infiltrates have been present for greater than 1 month. Electronically Signed   By: Lavonia Dana M.D.   On: 06/09/2021 17:29    My personal review of EKG: Rhythm, A. fib with RVR with heart rate of 152.  QTC of 473.     Assessment & Plan:    Principal Problem:   Sepsis ([place]) Active Problems:   Atrial fibrillation with RVR (HCC)   Pulmonary emphysema (HCC)   Acute on chronic respiratory failure (HCC)    Sepsis due to HCAP -Present on admission, hypotensive, febrile  of 103, tachycardic, with significant leukocytosis, worsening hypoxia and lactic acid of 4.2. -Patient with recent hospitalization, she will treated for H CAP, sepsis/pneumonia order set has been utilized, broad-spectrum antibiotic, will keep on  azithromycin as  well, will check sputum cultures, strep and Legionella antigen -We will use IV fluids judiciously given some mild edema on x-ray -Follow-up blood cultures.  A. fib with RVR -Continue with Cardizem drip for now, will resume Cardizem CD in a.m., will give metoprolol this evening. -Give 1 dose digoxin for better heart rate control in the setting of soft blood pressure -On warfarin, pharmacy to dose  Chronic hypoxic respiratory failure/history of COPD -He is on 3 to 4 L nasal cannula, she is currently on 4-5, this is in the setting of COPD -No active wheezing, so we will hold on IV steroids, continue with Xopenex as needed and home medications  CKD stage IV -Renal function at baseline, continue to monitor closely      DVT Prophylaxis on warfarin  AM Labs Ordered, also please review Full Orders  Family Communication: Admission, patients condition and plan of care including tests being ordered have been discussed with the patient and daughter at bedside who indicate understanding and agree with the plan and Code Status.  Code Status Full  Likely DC to  Home  Condition GUARDED    Consults called: none    Admission status: inpatient    Time spent in minutes : 60 minutes   Phillips Climes M.D on 06/09/2021 at 8:28 PM   [facility] - Office  [phone]

## 2021-06-09 NOTE — Progress Notes (Addendum)
Pharmacy Antibiotic/Coumadin Note  Melinda Collins is a 80 y.o. female admitted on 06/09/2021 with pneumonia.  Pharmacy has been consulted for vanc/cefepime dosing.  Pt presented with AMS. CXR showed bilateral infiltrates. Vanc/cefepime ordered empirically. Ok to change vanc to linezolid due to her CKD  Scr 1.54  Pt is on coumadin for afib. INR 2.7 on home dose.  PTA coumadin 5mg  qday except 7.5mg  Tue  Plan: Cefepime 2g IV q24 Linezolid 600mg  IV q12 F/u with MRSA PCR Coumadin 5mg  PO x1 Daily INR  Temp (24hrs), Avg:103 F (39.4 C), Min:103 F (39.4 C), Max:103 F (39.4 C)  Recent Labs  Lab 06/09/21 1640  WBC 20.0*  CREATININE 1.54*  LATICACIDVEN 4.2*    Estimated Creatinine Clearance: 28.1 mL/min (A) (by C-G formula based on SCr of 1.54 mg/dL (H)).    Allergies  Allergen Reactions   Baclofen     Feels crazy   Niaspan [Niacin]    Flexeril [Cyclobenzaprine]     Feels crazy    Antimicrobials this admission: 11/23 cefepime>> 11/23 azith>>x5 11/23 linezolid>>  Dose adjustments this admission:   Microbiology results: 11/23 blood>> 11/23 urine>> 11/23 urinary strep/legionella>>  Onnie Boer, PharmD, BCIDP, AAHIVP, CPP Infectious Disease Pharmacist 06/09/2021 8:29 PM

## 2021-06-09 NOTE — Sepsis Progress Note (Signed)
eLink is following this Code Sepsis. °

## 2021-06-09 NOTE — ED Triage Notes (Signed)
Presents from home with SOB, CP, AMS, and weakness since yesterday. H/o COPD, on 3-4L baseline, afib.

## 2021-06-10 ENCOUNTER — Encounter (HOSPITAL_COMMUNITY): Payer: Self-pay | Admitting: Internal Medicine

## 2021-06-10 ENCOUNTER — Other Ambulatory Visit: Payer: Self-pay

## 2021-06-10 DIAGNOSIS — J189 Pneumonia, unspecified organism: Secondary | ICD-10-CM

## 2021-06-10 DIAGNOSIS — I4891 Unspecified atrial fibrillation: Secondary | ICD-10-CM | POA: Diagnosis not present

## 2021-06-10 DIAGNOSIS — J9611 Chronic respiratory failure with hypoxia: Secondary | ICD-10-CM

## 2021-06-10 DIAGNOSIS — A419 Sepsis, unspecified organism: Secondary | ICD-10-CM | POA: Diagnosis not present

## 2021-06-10 DIAGNOSIS — G9341 Metabolic encephalopathy: Secondary | ICD-10-CM

## 2021-06-10 DIAGNOSIS — J181 Lobar pneumonia, unspecified organism: Secondary | ICD-10-CM

## 2021-06-10 LAB — BASIC METABOLIC PANEL
Anion gap: 7 (ref 5–15)
BUN: 28 mg/dL — ABNORMAL HIGH (ref 8–23)
CO2: 24 mmol/L (ref 22–32)
Calcium: 8.4 mg/dL — ABNORMAL LOW (ref 8.9–10.3)
Chloride: 107 mmol/L (ref 98–111)
Creatinine, Ser: 1.22 mg/dL — ABNORMAL HIGH (ref 0.44–1.00)
GFR, Estimated: 45 mL/min — ABNORMAL LOW (ref >60)
Glucose, Bld: 108 mg/dL — ABNORMAL HIGH (ref 70–99)
Potassium: 3.7 mmol/L (ref 3.5–5.1)
Sodium: 138 mmol/L (ref 135–145)

## 2021-06-10 LAB — CBC
HCT: 29.9 % — ABNORMAL LOW (ref 36.0–46.0)
Hemoglobin: 8.9 g/dL — ABNORMAL LOW (ref 12.0–15.0)
MCH: 29.7 pg (ref 26.0–34.0)
MCHC: 29.8 g/dL — ABNORMAL LOW (ref 30.0–36.0)
MCV: 99.7 fL (ref 80.0–100.0)
Platelets: 130 10*3/uL — ABNORMAL LOW (ref 150–400)
RBC: 3 MIL/uL — ABNORMAL LOW (ref 3.87–5.11)
RDW: 16.8 % — ABNORMAL HIGH (ref 11.5–15.5)
WBC: 13.6 10*3/uL — ABNORMAL HIGH (ref 4.0–10.5)
nRBC: 0 % (ref 0.0–0.2)

## 2021-06-10 LAB — STREP PNEUMONIAE URINARY ANTIGEN: Strep Pneumo Urinary Antigen: NEGATIVE

## 2021-06-10 LAB — PROTIME-INR
INR: 2.6 — ABNORMAL HIGH (ref 0.8–1.2)
Prothrombin Time: 28.2 seconds — ABNORMAL HIGH (ref 11.4–15.2)

## 2021-06-10 LAB — MRSA NEXT GEN BY PCR, NASAL: MRSA by PCR Next Gen: NOT DETECTED

## 2021-06-10 LAB — PROCALCITONIN: Procalcitonin: 1.71 ng/mL

## 2021-06-10 MED ORDER — GUAIFENESIN ER 600 MG PO TB12
600.0000 mg | ORAL_TABLET | Freq: Two times a day (BID) | ORAL | Status: DC
  Administered 2021-06-10 – 2021-06-12 (×5): 600 mg via ORAL
  Filled 2021-06-10 (×5): qty 1

## 2021-06-10 MED ORDER — WARFARIN SODIUM 5 MG PO TABS
5.0000 mg | ORAL_TABLET | Freq: Once | ORAL | Status: AC
  Administered 2021-06-10: 5 mg via ORAL
  Filled 2021-06-10: qty 1

## 2021-06-10 MED ORDER — GUAIFENESIN-DM 100-10 MG/5ML PO SYRP
5.0000 mL | ORAL_SOLUTION | ORAL | Status: DC | PRN
  Administered 2021-06-10 – 2021-06-11 (×4): 5 mL via ORAL
  Filled 2021-06-10 (×4): qty 5

## 2021-06-10 MED ORDER — LEVALBUTEROL HCL 0.63 MG/3ML IN NEBU
0.6300 mg | INHALATION_SOLUTION | Freq: Three times a day (TID) | RESPIRATORY_TRACT | Status: DC
Start: 2021-06-10 — End: 2021-06-10
  Administered 2021-06-10: 0.63 mg via RESPIRATORY_TRACT
  Filled 2021-06-10: qty 3

## 2021-06-10 MED ORDER — SODIUM CHLORIDE 0.9 % IV SOLN
2.0000 g | Freq: Two times a day (BID) | INTRAVENOUS | Status: DC
  Administered 2021-06-10 – 2021-06-12 (×5): 2 g via INTRAVENOUS
  Filled 2021-06-10 (×6): qty 2

## 2021-06-10 MED ORDER — ARFORMOTEROL TARTRATE 15 MCG/2ML IN NEBU
15.0000 ug | INHALATION_SOLUTION | Freq: Two times a day (BID) | RESPIRATORY_TRACT | Status: DC
  Administered 2021-06-10 – 2021-06-12 (×5): 15 ug via RESPIRATORY_TRACT
  Filled 2021-06-10 (×5): qty 2

## 2021-06-10 MED ORDER — CHLORHEXIDINE GLUCONATE CLOTH 2 % EX PADS
6.0000 | MEDICATED_PAD | Freq: Every day | CUTANEOUS | Status: DC
  Administered 2021-06-10: 6 via TOPICAL

## 2021-06-10 MED ORDER — LEVALBUTEROL HCL 0.63 MG/3ML IN NEBU
0.6300 mg | INHALATION_SOLUTION | Freq: Four times a day (QID) | RESPIRATORY_TRACT | Status: DC
  Administered 2021-06-10 – 2021-06-11 (×6): 0.63 mg via RESPIRATORY_TRACT
  Filled 2021-06-10 (×6): qty 3

## 2021-06-10 MED ORDER — BUDESONIDE 0.5 MG/2ML IN SUSP
0.5000 mg | Freq: Two times a day (BID) | RESPIRATORY_TRACT | Status: DC
  Administered 2021-06-10 – 2021-06-12 (×5): 0.5 mg via RESPIRATORY_TRACT
  Filled 2021-06-10 (×5): qty 2

## 2021-06-10 MED ORDER — IPRATROPIUM BROMIDE 0.02 % IN SOLN
0.5000 mg | Freq: Four times a day (QID) | RESPIRATORY_TRACT | Status: DC
  Administered 2021-06-10 – 2021-06-11 (×7): 0.5 mg via RESPIRATORY_TRACT
  Filled 2021-06-10 (×7): qty 2.5

## 2021-06-10 NOTE — Progress Notes (Signed)
PROGRESS NOTE  Melinda Collins UEK:[id number] DOB: [birth year] DOA: 06/09/2021 PCP: [name], FNP  Brief History:  80 year old female with a history of COPD, chronic respiratory failure on 3-4 L, diastolic CHF, pulmonary hypertension, chronic atrial fibrillation, CKD stage IV presenting with altered mental status for 1 to 2 days.  The patient had some associated shortness of breath and worsening coughing.  The patient is a difficult historian.  History is supplemented by the patient's daughter-in-law.  The patient quit smoking about a year ago.  At baseline, the patient lives by herself and able to ambulate " holding onto furniture".  Her family lives next door.  The patient also has caregiver during the daytime.  There is been no reports of nausea, vomiting or diarrhea, abdominal pain.  The patient denies any current headache, chest pain, mopped assist.  There is no hematochezia, melena, dysuria, hematuria.  Notably, the patient was recently admitted to the hospital from 04/25/2021 to 05/03/2021.  During that hospitalization, the patient was treated for pneumonia, COPD exacerbation, and acute on chronic diastolic CHF.  She also had an episode of atrial fibrillation with RVR. In the emergency department, the patient had a fever up to 103.0 F.  She was tachycardic up to 120s.  She was hypotensive initially with a blood pressure of 81/49.  BMP showed a sodium of 135 and serum creatinine 1.54.  LFTs were unremarkable.  WBC 20.0, hemoglobin 10.8, platelets 162,000.  UA was negative for pyuria.  Chest x-ray showed right greater than left infiltrates.  The patient was started on linezolid, cefepime, azithromycin.  Lactic acid was  Assessment/Plan: Severe sepsis -Present on admission -Presented with fever, tachycardia, WBC 20.0 -Lactic acid 4.2>> 1.6 -Check PCT -Continue linezolid, cefepime, azithromycin  Lobar pneumonia -Personally reviewed chest x-ray--R>L infiltrates -Continue  linezolid, cefepime, azithromycin -MRSA screen  Chronic atrial fibrillation with RVR -Initially started on diltiazem drip -Transition to oral diltiazem -Continue metoprolol succinate  Acute metabolic encephalopathy -Secondary to sepsis -Daughter-in-law at bedside states that mentation is much improved today  Chronic diastolic CHF -Appears clinically euvolemic today -Hold torsemide today and reevaluate for restart 06/11/2021  Chronic respiratory failure with hypoxia -Chronically on 3-4 L at home  COPD -Start Pulmicort and Brovana -Continue Xopenex -Add Atrovent  CKD stage IV -Baseline creatinine 1.4-1.6  Hyperlipidemia -Continue statin      Status is: Inpatient  Remains inpatient appropriate because: hemodynamic instability; severity of illness requiring IV antibiotics        Family Communication: daughter in law at bedside 11/24  Consultants:  none  Code Status:  FULL   DVT Prophylaxis:  warfarin   Procedures: As Listed in Progress Note Above  Antibiotics: Zyvox 1123>> Cefepime 11/23>> Azithro x 5      Subjective: Patient denies f/c, cp, n/v/d.  She has sob but better than yesterday.  Denies abd pain.  Objective: Vitals:   06/10/21 0026 06/10/21 0453 06/10/21 0718 06/10/21 0755  BP:      Pulse: (!) 102  (!) 45   Resp:   16   Temp:  (!) 97.5 F (36.4 C) (!) 97 F (36.1 C)   TempSrc:  Oral Axillary   SpO2: 99%  96% 100%  Weight: 69.7 kg     Height: 5\' 4"  (1.626 m)       Intake/Output Summary (Last 24 hours) at 06/10/2021 0818 Last data filed at 06/10/2021 0026 Gross per 24 hour  Intake 679.56 ml  Output 13 ml  Net 666.56 ml   Weight change:  Exam:  General:  Pt is alert, follows commands appropriately, not in acute distress HEENT: No icterus, No thrush, No neck mass, [place]/AT Cardiovascular: IRRR, S1/S2, no rubs, no gallops Respiratory: bilateral rales. Upper airway wheeze Abdomen: Soft/+BS, non tender, non distended, no  guarding Extremities: No edema, No lymphangitis, No petechiae, No rashes, no synovitis   Data Reviewed: I have personally reviewed following labs and imaging studies Basic Metabolic Panel: Recent Labs  Lab 06/09/21 1640 06/10/21 0530  NA 135 138  K 3.6 3.7  CL 100 107  CO2 23 24  GLUCOSE 186* 108*  BUN 30* 28*  CREATININE 1.54* 1.22*  CALCIUM 8.8* 8.4*   Liver Function Tests: Recent Labs  Lab 06/09/21 1640  AST 24  ALT 27  ALKPHOS 74  BILITOT 1.2  PROT 6.4*  ALBUMIN 3.2*   No results for input(s): LIPASE, AMYLASE in the last 168 hours. No results for input(s): AMMONIA in the last 168 hours. Coagulation Profile: Recent Labs  Lab 06/09/21 1640 06/10/21 0530  INR 2.7* 2.6*   CBC: Recent Labs  Lab 06/09/21 1640 06/10/21 0530  WBC 20.0* 13.6*  NEUTROABS 17.9*  --   HGB 10.8* 8.9*  HCT 33.9* 29.9*  MCV 97.7 99.7  PLT 162 130*   Cardiac Enzymes: No results for input(s): CKTOTAL, CKMB, CKMBINDEX, TROPONINI in the last 168 hours. BNP: Invalid input(s): POCBNP CBG: No results for input(s): GLUCAP in the last 168 hours. HbA1C: No results for input(s): HGBA1C in the last 72 hours. Urine analysis:    Component Value Date/Time   COLORURINE YELLOW 06/09/2021 1649   APPEARANCEUR CLEAR 06/09/2021 1649   LABSPEC 1.015 06/09/2021 1649   PHURINE 5.5 06/09/2021 1649   GLUCOSEU NEGATIVE 06/09/2021 1649   HGBUR NEGATIVE 06/09/2021 1649   BILIRUBINUR NEGATIVE 06/09/2021 1649   KETONESUR NEGATIVE 06/09/2021 1649   PROTEINUR NEGATIVE 06/09/2021 1649   NITRITE NEGATIVE 06/09/2021 1649   LEUKOCYTESUR NEGATIVE 06/09/2021 1649   Sepsis Labs: @LABRCNTIP (procalcitonin:4,lacticidven:4) ) Recent Results (from the past 240 hour(s))  Resp Panel by RT-PCR (Flu A&B, Covid) Nasopharyngeal Swab     Status: None   Collection Time: 06/09/21  4:39 PM   Specimen: Nasopharyngeal Swab; Nasopharyngeal(NP) swabs in vial transport medium  Result Value Ref Range Status   SARS  Coronavirus 2 by RT PCR NEGATIVE NEGATIVE Final    Comment: (NOTE) SARS-CoV-2 target nucleic acids are NOT DETECTED.  The SARS-CoV-2 RNA is generally detectable in upper respiratory specimens during the acute phase of infection. The lowest concentration of SARS-CoV-2 viral copies this assay can detect is 138 copies/mL. A negative result does not preclude SARS-Cov-2 infection and should not be used as the sole basis for treatment or other patient management decisions. A negative result may occur with  improper specimen collection/handling, submission of specimen other than nasopharyngeal swab, presence of viral mutation(s) within the areas targeted by this assay, and inadequate number of viral copies(<138 copies/mL). A negative result must be combined with clinical observations, patient history, and epidemiological information. The expected result is Negative.  Fact Sheet for Patients:  EntrepreneurPulse.com.au  Fact Sheet for Healthcare Providers:  IncredibleEmployment.be  This test is no t yet approved or cleared by the [place] FDA and  has been authorized for detection and/or diagnosis of SARS-CoV-2 by FDA under an Emergency Use Authorization (EUA). This EUA will remain  in effect (meaning this test can be used) for the duration of the COVID-19 declaration  under Section 564(b)(1) of the Act, 21 U.S.C.section 360bbb-3(b)(1), unless the authorization is terminated  or revoked sooner.       Influenza A by PCR NEGATIVE NEGATIVE Final   Influenza B by PCR NEGATIVE NEGATIVE Final    Comment: (NOTE) The Xpert Xpress SARS-CoV-2/FLU/RSV plus assay is intended as an aid in the diagnosis of influenza from Nasopharyngeal swab specimens and should not be used as a sole basis for treatment. Nasal washings and aspirates are unacceptable for Xpert Xpress SARS-CoV-2/FLU/RSV testing.  Fact Sheet for  Patients: EntrepreneurPulse.com.au  Fact Sheet for Healthcare Providers: IncredibleEmployment.be  This test is not yet approved or cleared by the [place] FDA and has been authorized for detection and/or diagnosis of SARS-CoV-2 by FDA under an Emergency Use Authorization (EUA). This EUA will remain in effect (meaning this test can be used) for the duration of the COVID-19 declaration under Section 564(b)(1) of the Act, 21 U.S.C. section 360bbb-3(b)(1), unless the authorization is terminated or revoked.  Performed at [facility], [address] [place], [place] [address]   Blood Culture (routine x 2)     Status: None (Preliminary result)   Collection Time: 06/09/21  5:04 PM   Specimen: BLOOD RIGHT HAND  Result Value Ref Range Status   Specimen Description BLOOD RIGHT HAND  Final   Special Requests   Final    BOTTLES DRAWN AEROBIC AND ANAEROBIC Blood Culture adequate volume   Culture   Final    NO GROWTH < 12 HOURS Performed at [facility], [address] [place], [place] [address]    Report Status PENDING  Incomplete  Blood Culture (routine x 2)     Status: None (Preliminary result)   Collection Time: 06/09/21  5:04 PM   Specimen: Right Antecubital; Blood  Result Value Ref Range Status   Specimen Description RIGHT ANTECUBITAL  Final   Special Requests   Final    BOTTLES DRAWN AEROBIC AND ANAEROBIC Blood Culture adequate volume   Culture   Final    NO GROWTH < 12 HOURS Performed at [facility], [address], [place], [place] [address]    Report Status PENDING  Incomplete     Scheduled Meds:  arformoterol  15 mcg Nebulization BID   atorvastatin  80 mg Oral Daily   budesonide (PULMICORT) nebulizer solution  0.5 mg Nebulization BID   Chlorhexidine Gluconate Cloth  6 each Topical Daily   diltiazem  180 mg Oral Daily   guaiFENesin  600 mg Oral BID   ipratropium  0.5 mg Nebulization Q6H   levalbuterol  0.63 mg Nebulization Q6H    metoprolol succinate  50 mg Oral QPM   pantoprazole  40 mg Oral Daily   Warfarin - Pharmacist Dosing Inpatient   Does not apply q1600   Continuous Infusions:  azithromycin Stopped (06/09/21 1807)   ceFEPime (MAXIPIME) IV     diltiazem (CARDIZEM) infusion 5 mg/hr (06/10/21 0026)   lactated ringers     linezolid (ZYVOX) IV Stopped (06/09/21 2352)    Procedures/Studies: DG Chest Port 1 View  Result Date: 06/09/2021 CLINICAL DATA:  Questionable sepsis EXAM: PORTABLE CHEST 1 VIEW COMPARISON:  Portable exam 1716 hours compared to 05/02/2021 FINDINGS: Enlargement of cardiac silhouette. Atherosclerotic calcification aorta. Progressive airspace consolidation in RIGHT upper lobe, less in the mid to lower LEFT lung and at RIGHT base. Associated pleural effusion/thickening in the RIGHT chest. No pneumothorax. Bones demineralized. IMPRESSION: Enlargement of cardiac silhouette. BILATERAL pulmonary infiltrates progressive in RIGHT upper lobe, favor pneumonia.  Radiographic follow-up until resolution recommended; if these fail to resolve or if patient has worsening symptoms, recommend CT assessment as these infiltrates have been present for greater than 1 month. Electronically Signed   By: Lavonia Dana M.D.   On: 06/09/2021 17:29    Orson Eva, DO  [facility]  If 7PM-7AM, please contact night-coverage [email or web address] Password [email or web address] 06/10/2021, 8:18 AM   LOS: 1 day

## 2021-06-10 NOTE — Progress Notes (Signed)
ANTICOAGULATION CONSULT NOTE - Initial Consult  Pharmacy Consult for warfarin Indication: atrial fibrillation  Allergies  Allergen Reactions   Baclofen     Feels crazy   Niaspan [Niacin]    Flexeril [Cyclobenzaprine]     Feels crazy    Patient Measurements: Height: 5\' 4"  (162.6 cm) Weight: 69.7 kg (153 lb 10.6 oz) IBW/kg (Calculated) : 54.7 Heparin Dosing Weight:   Vital Signs: Temp: 97 F (36.1 C) (11/24 0718) Temp Source: Axillary (11/24 0718) BP: 122/83 (11/24 0000) Pulse Rate: 45 (11/24 0718)  Labs: Recent Labs    06/09/21 1640 06/10/21 0530  HGB 10.8* 8.9*  HCT 33.9* 29.9*  PLT 162 130*  APTT 44*  --   LABPROT 28.7* 28.2*  INR 2.7* 2.6*  CREATININE 1.54* 1.22*    Estimated Creatinine Clearance: 36.4 mL/min (A) (by C-G formula based on SCr of 1.22 mg/dL (H)).   Medical History: Past Medical History:  Diagnosis Date   Aortic stenosis    Arthritis    Chronic hypoxemic respiratory failure (HCC)    COPD (chronic obstructive pulmonary disease) (HCC)    Depression    HOH (hard of hearing)    Mitral regurgitation    Mixed hyperlipidemia    Permanent atrial fibrillation (HCC)    Ruptured disk    Uterine cancer (HCC)     Medications:  Medications Prior to Admission  Medication Sig Dispense Refill Last Dose   acetaminophen (TYLENOL) 325 MG tablet Take 650 mg by mouth every 6 (six) hours as needed for moderate pain.   06/09/2021   ALPRAZolam (XANAX) 0.25 MG tablet Take 1 tablet (0.25 mg total) by mouth at bedtime as needed for anxiety. 30 tablet 2 06/08/2021   atorvastatin (LIPITOR) 80 MG tablet TAKE ONE TABLET BY MOUTH DAILY AT 6 PM (Patient taking differently: Take 80 mg by mouth daily.) 30 tablet 0 03/88/8280   bismuth subsalicylate (PEPTO BISMOL) 262 MG/15ML suspension Take 30 mLs by mouth every 6 (six) hours as needed.   unknown   diclofenac sodium (VOLTAREN) 1 % GEL Apply 2 g topically 4 (four) times daily. 350 g 3 unknown   diltiazem (CARDIZEM CD)  180 MG 24 hr capsule Take 1 capsule (180 mg total) by mouth daily. 90 capsule 1 Past Week   DULoxetine (CYMBALTA) 60 MG capsule Take 1 capsule (60 mg total) by mouth See admin instructions. Take 120 mg by mouth every evening (Patient taking differently: Take 60 mg by mouth 2 (two) times daily. Take 120 mg by mouth every evening)   06/08/2021   esomeprazole (NEXIUM) 40 MG capsule TAKE ONE CAPSULE BY MOUTH ONCE DAILY (MORNING PER PT) 30 capsule 5 06/08/2021   fluticasone (FLONASE) 50 MCG/ACT nasal spray USE ONE SPRAY IN EACH NOSTRIL ONCE DAILY. SHAKE GENTLY BEFORE USING. 16 g 5 Past Week   Fluticasone-Umeclidin-Vilant (TRELEGY ELLIPTA) 100-62.5-25 MCG/INH AEPB Inhale 1 puff into the lungs daily. 3 each 4 06/08/2021   guaiFENesin (MUCINEX) 600 MG 12 hr tablet Take 1 tablet (600 mg total) by mouth 2 (two) times daily as needed. 60 tablet 1 Past Week   levalbuterol (XOPENEX) 0.63 MG/3ML nebulizer solution Take 3 mLs (0.63 mg total) by nebulization every 6 (six) hours as needed for wheezing or shortness of breath. 3 mL 0 06/09/2021   loratadine (ALLERGY RELIEF) 10 MG tablet TAKE ONE TABLET BY MOUTH EVERY DAY (,EVENING) 30 tablet 5 06/08/2021   Magnesium Hydroxide (DULCOLAX PO) Take 25 mg by mouth daily.   unknown   metoprolol succinate (  TOPROL-XL) 50 MG 24 hr tablet Take 1 tablet (50 mg total) by mouth every evening. Take with or immediately following a meal. 90 tablet 1 Past Week at 1800   NON FORMULARY Inhale 2-4 L into the lungs daily. oxygen   06/09/2021   nystatin (MYCOSTATIN/NYSTOP) powder Apply 1 application topically 3 (three) times daily. 45 g 1 unknown   nystatin cream (MYCOSTATIN) Apply 1 application topically 2 (two) times daily. 60 g 2 unknown   torsemide (DEMADEX) 20 MG tablet TAKE 1 AND 1/2 TABLETS TO 2 TABLETS BY MOUTH EVERY MORNING, 1& 1/2 IN PACK EXTRA IN BOTTLE IF NEEDED (Patient taking differently: TAKE 1 AND 1/2 TABLETS TABLETS BY MOUTH EVERY MORNING, 1& 1/2 IN PACK EXTRA IN BOTTLE IF  NEEDED) 45 tablet 0 06/08/2021   vitamin B-12 (CYANOCOBALAMIN) 1000 MCG tablet TAKE ONE TABLET BY MOUTH DAILY WITH BREAKFAST (MORNING) (Patient taking differently: Take 1,000 mcg by mouth daily.) 30 tablet 5 Past Week   Vitamin D, Ergocalciferol, (DRISDOL) 1.25 MG (50000 UNIT) CAPS capsule TAKE ONE CAPSULE BY MOUTH EVERY 7 DAYS. (SUNDAY MORNING) (Patient taking differently: Take 50,000 Units by mouth every 7 (seven) days. Mondays) 12 capsule 3 Past Week   warfarin (COUMADIN) 5 MG tablet TAKE 1 OR 2 TABLETS BY MOUTH AS DIRECTED BY WARFARIN CLINIC (,1-SU,M,W,TH,F,SA/ ,1 &,1/2-TU) (Patient taking differently: Advance (,1-SU,M,W,TH,F,SA/ ,1 & 1/2-TU)) 180 tablet 0 06/07/2021 at 1800   albuterol (VENTOLIN HFA) 108 (90 Base) MCG/ACT inhaler INHALE 1-2 PUFFS INTO THE LUNGS EVERY 4 TO 6 HOURS AS NEEDED FOR SHORTNESS OF BREATH  (NEEDS TO BE SEEN BEFORE NEXT REFILL) (Patient not taking: Reported on 06/09/2021) 8.5 g 3 Not Taking   busPIRone (BUSPAR) 5 MG tablet Take 1 tablet (5 mg total) by mouth 3 (three) times daily as needed. (Patient not taking: Reported on 06/09/2021) 90 tablet 2 Not Taking   diazepam (VALIUM) 5 MG tablet Take 5 mg by mouth every 8 (eight) hours as needed for anxiety.      magic mouthwash (nystatin, lidocaine, diphenhydrAMINE) suspension Take 5 mLs by mouth 4 (four) times daily as needed for mouth pain. 180 mL 0    Multiple Vitamins-Minerals (CENTRUM SILVER PO) Take 1 tablet by mouth daily. (Patient not taking: Reported on 06/09/2021)   Not Taking   Spacer/Aero-Holding Chambers DEVI 1 Device by Does not apply route in the morning and at bedtime. 1 each 1     Assessment: Pharmacy consulted to dose warfarin in patient with atrial fibrillation.  Patient's home dose listed as 7.5 mg on Tue and 5 mg ROW.  INR 2.6 Hgb 8.9  Goal of Therapy:  INR 2-3 Monitor platelets by anticoagulation protocol: Yes   Plan:  Warfarin 5 mg x 1 dose. Monitor daily INR and s/s of bleeding.  Margot Ables, PharmD Clinical Pharmacist 06/10/2021 9:44 AM

## 2021-06-11 ENCOUNTER — Inpatient Hospital Stay (HOSPITAL_COMMUNITY): Payer: Medicare Other

## 2021-06-11 DIAGNOSIS — A419 Sepsis, unspecified organism: Secondary | ICD-10-CM | POA: Diagnosis not present

## 2021-06-11 DIAGNOSIS — R791 Abnormal coagulation profile: Secondary | ICD-10-CM

## 2021-06-11 DIAGNOSIS — I4891 Unspecified atrial fibrillation: Secondary | ICD-10-CM | POA: Diagnosis not present

## 2021-06-11 DIAGNOSIS — J9611 Chronic respiratory failure with hypoxia: Secondary | ICD-10-CM | POA: Diagnosis not present

## 2021-06-11 DIAGNOSIS — J181 Lobar pneumonia, unspecified organism: Secondary | ICD-10-CM | POA: Diagnosis not present

## 2021-06-11 LAB — CBC
HCT: 30.5 % — ABNORMAL LOW (ref 36.0–46.0)
Hemoglobin: 9.1 g/dL — ABNORMAL LOW (ref 12.0–15.0)
MCH: 30.4 pg (ref 26.0–34.0)
MCHC: 29.8 g/dL — ABNORMAL LOW (ref 30.0–36.0)
MCV: 102 fL — ABNORMAL HIGH (ref 80.0–100.0)
Platelets: 140 10*3/uL — ABNORMAL LOW (ref 150–400)
RBC: 2.99 MIL/uL — ABNORMAL LOW (ref 3.87–5.11)
RDW: 17 % — ABNORMAL HIGH (ref 11.5–15.5)
WBC: 7.2 10*3/uL (ref 4.0–10.5)
nRBC: 0 % (ref 0.0–0.2)

## 2021-06-11 LAB — BASIC METABOLIC PANEL
Anion gap: 8 (ref 5–15)
BUN: 29 mg/dL — ABNORMAL HIGH (ref 8–23)
CO2: 22 mmol/L (ref 22–32)
Calcium: 8.1 mg/dL — ABNORMAL LOW (ref 8.9–10.3)
Chloride: 111 mmol/L (ref 98–111)
Creatinine, Ser: 1.45 mg/dL — ABNORMAL HIGH (ref 0.44–1.00)
GFR, Estimated: 37 mL/min — ABNORMAL LOW (ref >60)
Glucose, Bld: 106 mg/dL — ABNORMAL HIGH (ref 70–99)
Potassium: 3.9 mmol/L (ref 3.5–5.1)
Sodium: 141 mmol/L (ref 135–145)

## 2021-06-11 LAB — PROTIME-INR
INR: 3.7 — ABNORMAL HIGH (ref 0.8–1.2)
Prothrombin Time: 36.4 seconds — ABNORMAL HIGH (ref 11.4–15.2)

## 2021-06-11 LAB — MAGNESIUM: Magnesium: 2 mg/dL (ref 1.7–2.4)

## 2021-06-11 LAB — URINE CULTURE: Culture: NO GROWTH

## 2021-06-11 MED ORDER — DOXYCYCLINE HYCLATE 100 MG PO TABS
100.0000 mg | ORAL_TABLET | Freq: Two times a day (BID) | ORAL | Status: DC
  Administered 2021-06-11 – 2021-06-12 (×3): 100 mg via ORAL
  Filled 2021-06-11 (×3): qty 1

## 2021-06-11 MED ORDER — IPRATROPIUM BROMIDE 0.02 % IN SOLN
0.5000 mg | Freq: Three times a day (TID) | RESPIRATORY_TRACT | Status: DC
  Administered 2021-06-12: 0.5 mg via RESPIRATORY_TRACT
  Filled 2021-06-11: qty 2.5

## 2021-06-11 MED ORDER — LEVALBUTEROL HCL 0.63 MG/3ML IN NEBU
0.6300 mg | INHALATION_SOLUTION | Freq: Three times a day (TID) | RESPIRATORY_TRACT | Status: DC
  Administered 2021-06-12: 0.63 mg via RESPIRATORY_TRACT
  Filled 2021-06-11: qty 3

## 2021-06-11 NOTE — Care Management Important Message (Signed)
Important Message  Patient Details  Name: Melinda Collins MRN: [id number] Date of Birth: [birth year]   Medicare Important Message Given:  Other (see comment)  Attempted to reach patient via room phone to review Medicare IM.  Unable to reach at this time.   Dannette Barbara 06/11/2021, 4:06 PM

## 2021-06-11 NOTE — Progress Notes (Signed)
PROGRESS NOTE  Melinda Collins SWN:[id number] DOB: 30-May-1941 DOA: 06/09/2021 PCP: [name], FNP  Brief History:  80 year old female with a history of COPD, chronic respiratory failure on 3-4 L, diastolic CHF, pulmonary hypertension, chronic atrial fibrillation, CKD stage IV presenting with altered mental status for 1 to 2 days.  The patient had some associated shortness of breath and worsening coughing.  The patient is a difficult historian.  History is supplemented by the patient's daughter-in-law.  The patient quit smoking about a year ago.  At baseline, the patient lives by herself and able to ambulate " holding onto furniture".  Her family lives next door.  The patient also has caregiver during the daytime.  There is been no reports of nausea, vomiting or diarrhea, abdominal pain.  The patient denies any current headache, chest pain, mopped assist.  There is no hematochezia, melena, dysuria, hematuria.  Notably, the patient was recently admitted to the hospital from 04/25/2021 to 05/03/2021.  During that hospitalization, the patient was treated for pneumonia, COPD exacerbation, and acute on chronic diastolic CHF.  She also had an episode of atrial fibrillation with RVR. In the emergency department, the patient had a fever up to 103.0 F.  She was tachycardic up to 120s.  She was hypotensive initially with a blood pressure of 81/49.  BMP showed a sodium of 135 and serum creatinine 1.54.  LFTs were unremarkable.  WBC 20.0, hemoglobin 10.8, platelets 162,000.  UA was negative for pyuria.  Chest x-ray showed right greater than left infiltrates.  The patient was started on linezolid, cefepime, azithromycin.  Lactic acid was   Assessment/Plan: Severe sepsis -Present on admission -Presented with fever, tachycardia, WBC 20.0 -Lactic acid 4.2>> 1.6 -Check PCT--1.71 -initally on linezolid, cefepime, azithromycin -d/c linezolid -sepsis physiology resolved   Lobar  pneumonia -Personally reviewed chest x-ray--R>L infiltrates -Continue linezolid, cefepime, azithromycin -MRSA screen--neg -d/c linezolid  Supratherapeutic INR -hold warfarin today -likely due drug-drug interaction with azithro  Hemoptysis -due to pneumonia and supratherapeutic INR -monitor H/H   Chronic atrial fibrillation with RVR -Initially started on diltiazem drip -Transitioned to oral diltiazem -Continue metoprolol succinate -now rate controlled   Acute metabolic encephalopathy -Secondary to sepsis -Daughter-in-law at bedside states that mentation is much improved today -11/25--mental status back to baseline   Chronic diastolic CHF -Appears clinically euvolemic today -Hold torsemide today and reevaluate for restart 06/11/2021   Chronic respiratory failure with hypoxia -Chronically on 3-4 L at home   COPD -Continue Pulmicort and Brovana -Continue Xopenex -Add Atrovent   CKD stage IV -Baseline creatinine 1.4-1.6   Hyperlipidemia -Continue statin           Status is: Inpatient   Remains inpatient appropriate because: hemodynamic instability; severity of illness requiring IV antibiotics               Family Communication: daughter in law at bedside 11/25   Consultants:  none   Code Status:  FULL    DVT Prophylaxis:  warfarin     Procedures: As Listed in Progress Note Above   Antibiotics: Zyvox 1123>> Cefepime 11/23>> Azithro>>doxy  Total time spent 35 minutes.  Greater than 50% spent face to face counseling and coordinating care.    Subjective: Patient is feeling better.  She is cough up blood tinged sputum, small to mod amount.  Denies cp, n/v/d, abd pain.    Objective: Vitals:   06/11/21 0700 06/11/21 0800 06/11/21 0817 06/11/21 0841  BP: (!) 141/84 (!) 139/94    Pulse:  (!) 43 (!) 55   Resp: 14 (!) 22 17   Temp:    97.8 F (36.6 C)  TempSrc:    Oral  SpO2:  100% 96%   Weight:      Height:        Intake/Output Summary  (Last 24 hours) at 06/11/2021 1154 Last data filed at 06/11/2021 0800 Gross per 24 hour  Intake 1359.49 ml  Output --  Net 1359.49 ml   Weight change:  Exam:  General:  Pt is alert, follows commands appropriately, not in acute distress HEENT: No icterus, No thrush, No neck mass, [place]/AT Cardiovascular: IRRR, S1/S2, no rubs, no gallops Respiratory: diminished BS.  Bilateral rales. Abdomen: Soft/+BS, non tender, non distended, no guarding Extremities: No edema, No lymphangitis, No petechiae, No rashes, no synovitis   Data Reviewed: I have personally reviewed following labs and imaging studies Basic Metabolic Panel: Recent Labs  Lab 06/09/21 1640 06/10/21 0530 06/11/21 0426  NA 135 138 141  K 3.6 3.7 3.9  CL 100 107 111  CO2 23 24 22   GLUCOSE 186* 108* 106*  BUN 30* 28* 29*  CREATININE 1.54* 1.22* 1.45*  CALCIUM 8.8* 8.4* 8.1*  MG  --   --  2.0   Liver Function Tests: Recent Labs  Lab 06/09/21 1640  AST 24  ALT 27  ALKPHOS 74  BILITOT 1.2  PROT 6.4*  ALBUMIN 3.2*   No results for input(s): LIPASE, AMYLASE in the last 168 hours. No results for input(s): AMMONIA in the last 168 hours. Coagulation Profile: Recent Labs  Lab 06/09/21 1640 06/10/21 0530 06/11/21 0426  INR 2.7* 2.6* 3.7*   CBC: Recent Labs  Lab 06/09/21 1640 06/10/21 0530 06/11/21 0426  WBC 20.0* 13.6* 7.2  NEUTROABS 17.9*  --   --   HGB 10.8* 8.9* 9.1*  HCT 33.9* 29.9* 30.5*  MCV 97.7 99.7 102.0*  PLT 162 130* 140*   Cardiac Enzymes: No results for input(s): CKTOTAL, CKMB, CKMBINDEX, TROPONINI in the last 168 hours. BNP: Invalid input(s): POCBNP CBG: No results for input(s): GLUCAP in the last 168 hours. HbA1C: No results for input(s): HGBA1C in the last 72 hours. Urine analysis:    Component Value Date/Time   COLORURINE YELLOW 06/09/2021 1649   APPEARANCEUR CLEAR 06/09/2021 1649   LABSPEC 1.015 06/09/2021 1649   PHURINE 5.5 06/09/2021 1649   GLUCOSEU NEGATIVE 06/09/2021 1649    HGBUR NEGATIVE 06/09/2021 1649   BILIRUBINUR NEGATIVE 06/09/2021 1649   KETONESUR NEGATIVE 06/09/2021 1649   PROTEINUR NEGATIVE 06/09/2021 1649   NITRITE NEGATIVE 06/09/2021 1649   LEUKOCYTESUR NEGATIVE 06/09/2021 1649   Sepsis Labs: @LABRCNTIP (procalcitonin:4,lacticidven:4) ) Recent Results (from the past 240 hour(s))  Resp Panel by RT-PCR (Flu A&B, Covid) Nasopharyngeal Swab     Status: None   Collection Time: 06/09/21  4:39 PM   Specimen: Nasopharyngeal Swab; Nasopharyngeal(NP) swabs in vial transport medium  Result Value Ref Range Status   SARS Coronavirus 2 by RT PCR NEGATIVE NEGATIVE Final    Comment: (NOTE) SARS-CoV-2 target nucleic acids are NOT DETECTED.  The SARS-CoV-2 RNA is generally detectable in upper respiratory specimens during the acute phase of infection. The lowest concentration of SARS-CoV-2 viral copies this assay can detect is 138 copies/mL. A negative result does not preclude SARS-Cov-2 infection and should not be used as the sole basis for treatment or other patient management decisions. A negative result may occur with  improper specimen collection/handling, submission  of specimen other than nasopharyngeal swab, presence of viral mutation(s) within the areas targeted by this assay, and inadequate number of viral copies(<138 copies/mL). A negative result must be combined with clinical observations, patient history, and epidemiological information. The expected result is Negative.  Fact Sheet for Patients:  EntrepreneurPulse.com.au  Fact Sheet for Healthcare Providers:  IncredibleEmployment.be  This test is no t yet approved or cleared by the [place] FDA and  has been authorized for detection and/or diagnosis of SARS-CoV-2 by FDA under an Emergency Use Authorization (EUA). This EUA will remain  in effect (meaning this test can be used) for the duration of the COVID-19 declaration under Section 564(b)(1) of  the Act, 21 U.S.C.section 360bbb-3(b)(1), unless the authorization is terminated  or revoked sooner.       Influenza A by PCR NEGATIVE NEGATIVE Final   Influenza B by PCR NEGATIVE NEGATIVE Final    Comment: (NOTE) The Xpert Xpress SARS-CoV-2/FLU/RSV plus assay is intended as an aid in the diagnosis of influenza from Nasopharyngeal swab specimens and should not be used as a sole basis for treatment. Nasal washings and aspirates are unacceptable for Xpert Xpress SARS-CoV-2/FLU/RSV testing.  Fact Sheet for Patients: EntrepreneurPulse.com.au  Fact Sheet for Healthcare Providers: IncredibleEmployment.be  This test is not yet approved or cleared by the [place] FDA and has been authorized for detection and/or diagnosis of SARS-CoV-2 by FDA under an Emergency Use Authorization (EUA). This EUA will remain in effect (meaning this test can be used) for the duration of the COVID-19 declaration under Section 564(b)(1) of the Act, 21 U.S.C. section 360bbb-3(b)(1), unless the authorization is terminated or revoked.  Performed at [facility], [address] [place], [place] [address]   Urine Culture     Status: None   Collection Time: 06/09/21  4:49 PM   Specimen: In/Out Cath Urine  Result Value Ref Range Status   Specimen Description   Final    IN/OUT CATH URINE Performed at [facility], [address], [place], [place] [address]    Special Requests   Final    NONE Performed at [facility], [address] [place], [place] [address]    Culture   Final    NO GROWTH Performed at [facility], [place] [address], [place], [place] [address]    Report Status 06/11/2021 FINAL  Final  Blood Culture (routine x 2)     Status: None (Preliminary result)   Collection Time: 06/09/21  5:04 PM   Specimen: BLOOD RIGHT HAND  Result Value Ref Range Status   Specimen Description BLOOD RIGHT HAND  Final   Special Requests   Final    BOTTLES DRAWN AEROBIC  AND ANAEROBIC Blood Culture adequate volume   Culture   Final    NO GROWTH 2 DAYS Performed at [facility], [address], [place], [place] [address]    Report Status PENDING  Incomplete  Blood Culture (routine x 2)     Status: None (Preliminary result)   Collection Time: 06/09/21  5:04 PM   Specimen: Right Antecubital; Blood  Result Value Ref Range Status   Specimen Description RIGHT ANTECUBITAL  Final   Special Requests   Final    BOTTLES DRAWN AEROBIC AND ANAEROBIC Blood Culture adequate volume   Culture   Final    NO GROWTH 2 DAYS Performed at [facility], [address] [place],  [address]    Report Status PENDING  Incomplete  MRSA Next Gen by PCR, Nasal  Status: None   Collection Time: 06/09/21  8:50 PM   Specimen: Nasal Mucosa; Nasal Swab  Result Value Ref Range Status   MRSA by PCR Next Gen NOT DETECTED NOT DETECTED Final    Comment: (NOTE) The GeneXpert MRSA Assay (FDA approved for NASAL specimens only), is one component of a comprehensive MRSA colonization surveillance program. It is not intended to diagnose MRSA infection nor to guide or monitor treatment for MRSA infections. Test performance is not FDA approved in patients less than 1 years old. Performed at [facility], [address] [place], [place] [address]      Scheduled Meds:  arformoterol  15 mcg Nebulization BID   atorvastatin  80 mg Oral Daily   budesonide (PULMICORT) nebulizer solution  0.5 mg Nebulization BID   Chlorhexidine Gluconate Cloth  6 each Topical Daily   diltiazem  180 mg Oral Daily   guaiFENesin  600 mg Oral BID   ipratropium  0.5 mg Nebulization Q6H   levalbuterol  0.63 mg Nebulization Q6H   metoprolol succinate  50 mg Oral QPM   pantoprazole  40 mg Oral Daily   Warfarin - Pharmacist Dosing Inpatient   Does not apply q1600   Continuous Infusions:  ceFEPime (MAXIPIME) IV 2 g (06/11/21 1111)   diltiazem (CARDIZEM) infusion Stopped (06/10/21 1030)   linezolid (ZYVOX) IV  300 mL/hr at 06/10/21 2255    Procedures/Studies: DG Chest Port 1 View  Result Date: 06/09/2021 CLINICAL DATA:  Questionable sepsis EXAM: PORTABLE CHEST 1 VIEW COMPARISON:  Portable exam 1716 hours compared to 05/02/2021 FINDINGS: Enlargement of cardiac silhouette. Atherosclerotic calcification aorta. Progressive airspace consolidation in RIGHT upper lobe, less in the mid to lower LEFT lung and at RIGHT base. Associated pleural effusion/thickening in the RIGHT chest. No pneumothorax. Bones demineralized. IMPRESSION: Enlargement of cardiac silhouette. BILATERAL pulmonary infiltrates progressive in RIGHT upper lobe, favor pneumonia. Radiographic follow-up until resolution recommended; if these fail to resolve or if patient has worsening symptoms, recommend CT assessment as these infiltrates have been present for greater than 1 month. Electronically Signed   By: Lavonia Dana M.D.   On: 06/09/2021 17:29    Orson Eva, DO  [facility]  If 7PM-7AM, please contact night-coverage [email or web address] Password [email or web address] 06/11/2021, 11:54 AM   LOS: 2 days

## 2021-06-11 NOTE — Progress Notes (Signed)
Pt pupils noted to be unequal during shift assessment L > R. Pupils are both reactive to light and pt reports no visual deficits. No focal abnormalities noted. Pt is A&Ox4. Pt family reports that they noticed her L pupil becoming dilated over the last 2-3 days. MD notified of these findings, CT head ordered. Will continue to monitor.

## 2021-06-11 NOTE — Progress Notes (Signed)
ANTICOAGULATION CONSULT NOTE -   Pharmacy Consult for warfarin Indication: atrial fibrillation  Allergies  Allergen Reactions   Baclofen     Feels crazy   Niaspan [Niacin]    Flexeril [Cyclobenzaprine]     Feels crazy    Patient Measurements: Height: 5\' 4"  (162.6 cm) Weight: 69.7 kg (153 lb 10.6 oz) IBW/kg (Calculated) : 54.7 Heparin Dosing Weight:   Vital Signs: Temp: 98 F (36.7 C) (11/24 2237) Temp Source: Oral (11/24 2237) BP: 121/94 (11/24 2300) Pulse Rate: 96 (11/24 2300)  Labs: Recent Labs    06/09/21 1640 06/10/21 0530 06/11/21 0426  HGB 10.8* 8.9* 9.1*  HCT 33.9* 29.9* 30.5*  PLT 162 130* 140*  APTT 44*  --   --   LABPROT 28.7* 28.2* 36.4*  INR 2.7* 2.6* 3.7*  CREATININE 1.54* 1.22* 1.45*     Estimated Creatinine Clearance: 30.6 mL/min (A) (by C-G formula based on SCr of 1.45 mg/dL (H)).   Medical History: Past Medical History:  Diagnosis Date   Aortic stenosis    Arthritis    Chronic hypoxemic respiratory failure (HCC)    COPD (chronic obstructive pulmonary disease) (HCC)    Depression    HOH (hard of hearing)    Mitral regurgitation    Mixed hyperlipidemia    Permanent atrial fibrillation (HCC)    Ruptured disk    Uterine cancer (HCC)     Medications:  Medications Prior to Admission  Medication Sig Dispense Refill Last Dose   acetaminophen (TYLENOL) 325 MG tablet Take 650 mg by mouth every 6 (six) hours as needed for moderate pain.   06/09/2021   ALPRAZolam (XANAX) 0.25 MG tablet Take 1 tablet (0.25 mg total) by mouth at bedtime as needed for anxiety. 30 tablet 2 06/08/2021   atorvastatin (LIPITOR) 80 MG tablet TAKE ONE TABLET BY MOUTH DAILY AT 6 PM (Patient taking differently: Take 80 mg by mouth daily.) 30 tablet 0 44/07/270   bismuth subsalicylate (PEPTO BISMOL) 262 MG/15ML suspension Take 30 mLs by mouth every 6 (six) hours as needed.   unknown   diclofenac sodium (VOLTAREN) 1 % GEL Apply 2 g topically 4 (four) times daily. 350 g 3  unknown   diltiazem (CARDIZEM CD) 180 MG 24 hr capsule Take 1 capsule (180 mg total) by mouth daily. 90 capsule 1 Past Week   DULoxetine (CYMBALTA) 60 MG capsule Take 1 capsule (60 mg total) by mouth See admin instructions. Take 120 mg by mouth every evening (Patient taking differently: Take 60 mg by mouth 2 (two) times daily. Take 120 mg by mouth every evening)   06/08/2021   esomeprazole (NEXIUM) 40 MG capsule TAKE ONE CAPSULE BY MOUTH ONCE DAILY (MORNING PER PT) 30 capsule 5 06/08/2021   fluticasone (FLONASE) 50 MCG/ACT nasal spray USE ONE SPRAY IN EACH NOSTRIL ONCE DAILY. SHAKE GENTLY BEFORE USING. 16 g 5 Past Week   Fluticasone-Umeclidin-Vilant (TRELEGY ELLIPTA) 100-62.5-25 MCG/INH AEPB Inhale 1 puff into the lungs daily. 3 each 4 06/08/2021   guaiFENesin (MUCINEX) 600 MG 12 hr tablet Take 1 tablet (600 mg total) by mouth 2 (two) times daily as needed. 60 tablet 1 Past Week   levalbuterol (XOPENEX) 0.63 MG/3ML nebulizer solution Take 3 mLs (0.63 mg total) by nebulization every 6 (six) hours as needed for wheezing or shortness of breath. 3 mL 0 06/09/2021   loratadine (ALLERGY RELIEF) 10 MG tablet TAKE ONE TABLET BY MOUTH EVERY DAY (,EVENING) 30 tablet 5 06/08/2021   Magnesium Hydroxide (DULCOLAX PO) Take 25  mg by mouth daily.   unknown   metoprolol succinate (TOPROL-XL) 50 MG 24 hr tablet Take 1 tablet (50 mg total) by mouth every evening. Take with or immediately following a meal. 90 tablet 1 Past Week at 1800   NON FORMULARY Inhale 2-4 L into the lungs daily. oxygen   06/09/2021   nystatin (MYCOSTATIN/NYSTOP) powder Apply 1 application topically 3 (three) times daily. 45 g 1 unknown   nystatin cream (MYCOSTATIN) Apply 1 application topically 2 (two) times daily. 60 g 2 unknown   torsemide (DEMADEX) 20 MG tablet TAKE 1 AND 1/2 TABLETS TO 2 TABLETS BY MOUTH EVERY MORNING, 1& 1/2 IN PACK EXTRA IN BOTTLE IF NEEDED (Patient taking differently: TAKE 1 AND 1/2 TABLETS TABLETS BY MOUTH EVERY MORNING, 1&  1/2 IN PACK EXTRA IN BOTTLE IF NEEDED) 45 tablet 0 06/08/2021   vitamin B-12 (CYANOCOBALAMIN) 1000 MCG tablet TAKE ONE TABLET BY MOUTH DAILY WITH BREAKFAST (MORNING) (Patient taking differently: Take 1,000 mcg by mouth daily.) 30 tablet 5 Past Week   Vitamin D, Ergocalciferol, (DRISDOL) 1.25 MG (50000 UNIT) CAPS capsule TAKE ONE CAPSULE BY MOUTH EVERY 7 DAYS. (SUNDAY MORNING) (Patient taking differently: Take 50,000 Units by mouth every 7 (seven) days. Mondays) 12 capsule 3 Past Week   warfarin (COUMADIN) 5 MG tablet TAKE 1 OR 2 TABLETS BY MOUTH AS DIRECTED BY WARFARIN CLINIC (,1-SU,M,W,TH,F,SA/ ,1 &,1/2-TU) (Patient taking differently: [place] (,1-SU,M,W,TH,F,SA/ ,1 & 1/2-TU)) 180 tablet 0 06/07/2021 at 1800   albuterol (VENTOLIN HFA) 108 (90 Base) MCG/ACT inhaler INHALE 1-2 PUFFS INTO THE LUNGS EVERY 4 TO 6 HOURS AS NEEDED FOR SHORTNESS OF BREATH  (NEEDS TO BE SEEN BEFORE NEXT REFILL) (Patient not taking: Reported on 06/09/2021) 8.5 g 3 Not Taking   busPIRone (BUSPAR) 5 MG tablet Take 1 tablet (5 mg total) by mouth 3 (three) times daily as needed. (Patient not taking: Reported on 06/09/2021) 90 tablet 2 Not Taking   diazepam (VALIUM) 5 MG tablet Take 5 mg by mouth every 8 (eight) hours as needed for anxiety.      magic mouthwash (nystatin, lidocaine, diphenhydrAMINE) suspension Take 5 mLs by mouth 4 (four) times daily as needed for mouth pain. 180 mL 0    Multiple Vitamins-Minerals (CENTRUM SILVER PO) Take 1 tablet by mouth daily. (Patient not taking: Reported on 06/09/2021)   Not Taking   Spacer/Aero-Holding Chambers DEVI 1 Device by Does not apply route in the morning and at bedtime. 1 each 1     Assessment: Pharmacy consulted to dose warfarin in patient with atrial fibrillation.  Patient's home dose listed as 7.5 mg on Tue and 5 mg ROW.  INR 2.6> 3.7 Hgb 9.1  Goal of Therapy:  INR 2-3 Monitor platelets by anticoagulation protocol: Yes   Plan:  Hold warfarin x 1 dose. Monitor  daily INR and s/s of bleeding.  Margot Ables, PharmD Clinical Pharmacist 06/11/2021 7:52 AM

## 2021-06-12 DIAGNOSIS — J9611 Chronic respiratory failure with hypoxia: Secondary | ICD-10-CM | POA: Diagnosis not present

## 2021-06-12 DIAGNOSIS — A419 Sepsis, unspecified organism: Secondary | ICD-10-CM | POA: Diagnosis not present

## 2021-06-12 DIAGNOSIS — J181 Lobar pneumonia, unspecified organism: Secondary | ICD-10-CM | POA: Diagnosis not present

## 2021-06-12 DIAGNOSIS — I4891 Unspecified atrial fibrillation: Secondary | ICD-10-CM | POA: Diagnosis not present

## 2021-06-12 LAB — LEGIONELLA PNEUMOPHILA SEROGP 1 UR AG: L. pneumophila Serogp 1 Ur Ag: NEGATIVE

## 2021-06-12 LAB — CBC
HCT: 30 % — ABNORMAL LOW (ref 36.0–46.0)
Hemoglobin: 9 g/dL — ABNORMAL LOW (ref 12.0–15.0)
MCH: 30.3 pg (ref 26.0–34.0)
MCHC: 30 g/dL (ref 30.0–36.0)
MCV: 101 fL — ABNORMAL HIGH (ref 80.0–100.0)
Platelets: 176 10*3/uL (ref 150–400)
RBC: 2.97 MIL/uL — ABNORMAL LOW (ref 3.87–5.11)
RDW: 16.7 % — ABNORMAL HIGH (ref 11.5–15.5)
WBC: 6.4 10*3/uL (ref 4.0–10.5)
nRBC: 0 % (ref 0.0–0.2)

## 2021-06-12 LAB — PROTIME-INR
INR: 3.2 — ABNORMAL HIGH (ref 0.8–1.2)
Prothrombin Time: 32.7 seconds — ABNORMAL HIGH (ref 11.4–15.2)

## 2021-06-12 LAB — BASIC METABOLIC PANEL
Anion gap: 5 (ref 5–15)
BUN: 24 mg/dL — ABNORMAL HIGH (ref 8–23)
CO2: 23 mmol/L (ref 22–32)
Calcium: 8.7 mg/dL — ABNORMAL LOW (ref 8.9–10.3)
Chloride: 112 mmol/L — ABNORMAL HIGH (ref 98–111)
Creatinine, Ser: 1.21 mg/dL — ABNORMAL HIGH (ref 0.44–1.00)
GFR, Estimated: 46 mL/min — ABNORMAL LOW (ref >60)
Glucose, Bld: 99 mg/dL (ref 70–99)
Potassium: 4.1 mmol/L (ref 3.5–5.1)
Sodium: 140 mmol/L (ref 135–145)

## 2021-06-12 LAB — MAGNESIUM: Magnesium: 2 mg/dL (ref 1.7–2.4)

## 2021-06-12 MED ORDER — DOXYCYCLINE HYCLATE 100 MG PO TABS: 100.0000 mg | ORAL_TABLET | Freq: Two times a day (BID) | ORAL | 0 refills | Status: DC

## 2021-06-12 MED ORDER — CEFDINIR 300 MG PO CAPS: 300.0000 mg | ORAL_CAPSULE | Freq: Two times a day (BID) | ORAL | 0 refills | Status: DC

## 2021-06-12 NOTE — Progress Notes (Signed)
Discharge paperwork and instructions provided to patient and patient's granddaughter. PIV's removed, sites clean, dry, and intact. All belongings returned to patient's satisfaction. Pt transported via wheelchair to main entrance with NT.

## 2021-06-12 NOTE — Discharge Summary (Signed)
Physician Discharge Summary  Melinda Collins IWL:[id number] DOB: 1940/09/27 DOA: 06/09/2021  PCP: [name], FNP  Admit date: 06/09/2021 Discharge date: 06/12/2021  Admitted From: Home Disposition:  Home   Recommendations for Outpatient Follow-up:  Follow up with PCP in 1-2 weeks Please obtain BMP/CBC in one week Please check INR on 06/14/21 and adjust warfarin accordingly for INR 2-3 (anticipate INR variability with doxy)     Discharge Condition: Stable CODE STATUS:FULL Diet recommendation: Heart Healthy    Brief/Interim Summary: 80 year old female with a history of COPD, chronic respiratory failure on 3-4 L, diastolic CHF, pulmonary hypertension, chronic atrial fibrillation, CKD stage IV presenting with altered mental status for 1 to 2 days.  The patient had some associated shortness of breath and worsening coughing.  The patient is a difficult historian.  History is supplemented by the patient's daughter-in-law.  The patient quit smoking about a year ago.  At baseline, the patient lives by herself and able to ambulate " holding onto furniture".  Her family lives next door.  The patient also has caregiver during the daytime.  There is been no reports of nausea, vomiting or diarrhea, abdominal pain.  The patient denies any current headache, chest pain, mopped assist.  There is no hematochezia, melena, dysuria, hematuria.  Notably, the patient was recently admitted to the hospital from 04/25/2021 to 05/03/2021.  During that hospitalization, the patient was treated for pneumonia, COPD exacerbation, and acute on chronic diastolic CHF.  She also had an episode of atrial fibrillation with RVR. In the emergency department, the patient had a fever up to 103.0 F.  She was tachycardic up to 120s.  She was hypotensive initially with a blood pressure of 81/49.  BMP showed a sodium of 135 and serum creatinine 1.54.  LFTs were unremarkable.  WBC 20.0, hemoglobin 10.8, platelets 162,000.  UA  was negative for pyuria.  Chest x-ray showed right greater than left infiltrates.  The patient was started on linezolid, cefepime, azithromycin.  Lactic acid was 4.2    Discharge Diagnoses:   Severe sepsis -Present on admission -Presented with fever, tachycardia, WBC 20.0 -Lactic acid 4.2>> 1.6 -Check PCT--1.71 -initally on linezolid, cefepime, azithromycin -d/c linezolid -sepsis physiology resolved   Lobar pneumonia -Personally reviewed chest x-ray--R>L infiltrates -Continue linezolid, cefepime, azithromycin>>doxy -MRSA screen--neg -d/c linezolid -d/c home with cefdinir and doxy x 4 more days   Supratherapeutic INR -hold warfarin 11/25 -likely due drug-drug interaction with azithro -INR 3.7>>3.2 -hold warfarin 11/26 and restart 11/27 -recheck INR 11/28 which daughter in-law has been monitoring   Hemoptysis -due to pneumonia and supratherapeutic INR -monitor H/H>>hgb remains stable -no further hemoptysis at time of d/c   Chronic atrial fibrillation with RVR -Initially started on diltiazem drip -Transitioned to oral diltiazem -Continue metoprolol succinate -now rate controlled   Acute metabolic encephalopathy -Secondary to sepsis -Daughter-in-law at bedside states that mentation is much improved today -11/25--mental status back to baseline   Chronic diastolic CHF -Appears clinically euvolemic today -Hold torsemide temporarily>>restart after d/c  Chronic respiratory failure with hypoxia -Chronically on 3-4 L at home -stable on 3L presently   COPD -Continue Pulmicort and Brovana -Continue Xopenex -Add Atrovent   CKD stage IV -Baseline creatinine 1.4-1.6 -serum creatinine 1.21 at time of d/c   Hyperlipidemia -Continue statin    Discharge Instructions   Allergies as of 06/12/2021       Reactions   Baclofen    Feels crazy   Niaspan [niacin]    Flexeril [cyclobenzaprine]  Feels crazy        Medication List     STOP taking these medications     albuterol 108 (90 Base) MCG/ACT inhaler Commonly known as: VENTOLIN HFA   busPIRone 5 MG tablet Commonly known as: BUSPAR   CENTRUM SILVER PO   magic mouthwash (nystatin, lidocaine, diphenhydrAMINE) suspension       TAKE these medications    acetaminophen 325 MG tablet Commonly known as: TYLENOL Take 650 mg by mouth every 6 (six) hours as needed for moderate pain.   ALPRAZolam 0.25 MG tablet Commonly known as: Xanax Take 1 tablet (0.25 mg total) by mouth at bedtime as needed for anxiety.   atorvastatin 80 MG tablet Commonly known as: LIPITOR TAKE ONE TABLET BY MOUTH DAILY AT 6 PM What changed: See the new instructions.   bismuth subsalicylate 814 GY/18HU suspension Commonly known as: PEPTO BISMOL Take 30 mLs by mouth every 6 (six) hours as needed.   cefdinir 300 MG capsule Commonly known as: OMNICEF Take 1 capsule (300 mg total) by mouth 2 (two) times daily.   diazepam 5 MG tablet Commonly known as: VALIUM Take 5 mg by mouth every 8 (eight) hours as needed for anxiety.   diclofenac sodium 1 % Gel Commonly known as: VOLTAREN Apply 2 g topically 4 (four) times daily.   diltiazem 180 MG 24 hr capsule Commonly known as: CARDIZEM CD Take 1 capsule (180 mg total) by mouth daily.   doxycycline 100 MG tablet Commonly known as: VIBRA-TABS Take 1 tablet (100 mg total) by mouth every 12 (twelve) hours.   DULCOLAX PO Take 25 mg by mouth daily.   DULoxetine 60 MG capsule Commonly known as: CYMBALTA Take 1 capsule (60 mg total) by mouth See admin instructions. Take 120 mg by mouth every evening What changed: when to take this   esomeprazole 40 MG capsule Commonly known as: NEXIUM TAKE ONE CAPSULE BY MOUTH ONCE DAILY (MORNING PER PT)   fluticasone 50 MCG/ACT nasal spray Commonly known as: FLONASE USE ONE SPRAY IN EACH NOSTRIL ONCE DAILY. SHAKE GENTLY BEFORE USING.   guaiFENesin 600 MG 12 hr tablet Commonly known as: MUCINEX Take 1 tablet (600 mg total) by  mouth 2 (two) times daily as needed.   levalbuterol 0.63 MG/3ML nebulizer solution Commonly known as: XOPENEX Take 3 mLs (0.63 mg total) by nebulization every 6 (six) hours as needed for wheezing or shortness of breath.   loratadine 10 MG tablet Commonly known as: Allergy Relief TAKE ONE TABLET BY MOUTH EVERY DAY (,EVENING)   metoprolol succinate 50 MG 24 hr tablet Commonly known as: TOPROL-XL Take 1 tablet (50 mg total) by mouth every evening. Take with or immediately following a meal.   NON FORMULARY Inhale 2-4 L into the lungs daily. oxygen   nystatin powder Commonly known as: MYCOSTATIN/NYSTOP Apply 1 application topically 3 (three) times daily.   nystatin cream Commonly known as: MYCOSTATIN Apply 1 application topically 2 (two) times daily.   Spacer/Aero-Holding Dorise Bullion 1 Device by Does not apply route in the morning and at bedtime.   torsemide 20 MG tablet Commonly known as: DEMADEX TAKE 1 AND 1/2 TABLETS TO 2 TABLETS BY MOUTH EVERY MORNING, 1& 1/2 IN PACK EXTRA IN BOTTLE IF NEEDED What changed: additional instructions   Trelegy Ellipta 100-62.5-25 MCG/ACT Aepb Generic drug: Fluticasone-Umeclidin-Vilant Inhale 1 puff into the lungs daily.   vitamin B-12 1000 MCG tablet Commonly known as: CYANOCOBALAMIN TAKE ONE TABLET BY MOUTH DAILY WITH BREAKFAST (MORNING) What  changed: See the new instructions.   Vitamin D (Ergocalciferol) 1.25 MG (50000 UNIT) Caps capsule Commonly known as: DRISDOL TAKE ONE CAPSULE BY MOUTH EVERY 7 DAYS. (SUNDAY MORNING) What changed: See the new instructions.   warfarin 5 MG tablet Commonly known as: COUMADIN Take as directed. If you are unsure how to take this medication, talk to your nurse or doctor. Original instructions: TAKE 1 OR 2 TABLETS BY MOUTH AS DIRECTED BY WARFARIN CLINIC (,1-SU,M,W,TH,F,SA/ ,1 &,1/2-TU) What changed: additional instructions        Allergies  Allergen Reactions   Baclofen     Feels crazy    Niaspan [Niacin]    Flexeril [Cyclobenzaprine]     Feels crazy    Consultations: none   Procedures/Studies: CT HEAD WO CONTRAST (5MM)  Result Date: 06/11/2021 CLINICAL DATA:  Dilated pupil EXAM: CT HEAD WITHOUT CONTRAST TECHNIQUE: Contiguous axial images were obtained from the base of the skull through the vertex without intravenous contrast. COMPARISON:  CT brain 09/15/2020 FINDINGS: Brain: No acute territorial infarction, hemorrhage or intracranial mass. Mild atrophy. Patchy white matter hypodensity consistent with chronic small vessel ischemic change. The ventricles are stable in size. Vascular: No hyperdense vessels.  Carotid vascular calcification Skull: Normal. Negative for fracture or focal lesion. Sinuses/Orbits: No acute finding. Other: None IMPRESSION: 1. No CT evidence for acute intracranial abnormality. 2. Atrophy and mild chronic small vessel ischemic changes of the white matter Electronically Signed   By: Donavan Foil M.D.   On: 06/11/2021 22:27   DG Chest Port 1 View  Result Date: 06/09/2021 CLINICAL DATA:  Questionable sepsis EXAM: PORTABLE CHEST 1 VIEW COMPARISON:  Portable exam 1716 hours compared to 05/02/2021 FINDINGS: Enlargement of cardiac silhouette. Atherosclerotic calcification aorta. Progressive airspace consolidation in RIGHT upper lobe, less in the mid to lower LEFT lung and at RIGHT base. Associated pleural effusion/thickening in the RIGHT chest. No pneumothorax. Bones demineralized. IMPRESSION: Enlargement of cardiac silhouette. BILATERAL pulmonary infiltrates progressive in RIGHT upper lobe, favor pneumonia. Radiographic follow-up until resolution recommended; if these fail to resolve or if patient has worsening symptoms, recommend CT assessment as these infiltrates have been present for greater than 1 month. Electronically Signed   By: Lavonia Dana M.D.   On: 06/09/2021 17:29        Discharge Exam: Vitals:   06/12/21 0828 06/12/21 0910  BP:    Pulse: 65    Resp: (!) 21   Temp:    SpO2: 100% 98%   Vitals:   06/12/21 0730 06/12/21 0800 06/12/21 0828 06/12/21 0910  BP:  (!) 137/110    Pulse:  (!) 53 65   Resp: (!) 28 (!) 24 (!) 21   Temp: 98.2 F (36.8 C)     TempSrc: Oral     SpO2:  100% 100% 98%  Weight:      Height:        General: Pt is alert, awake, not in acute distress Cardiovascular: RRR, S1/S2 +, no rubs, no gallops Respiratory: scattered bilateral rales. No wheeze Abdominal: Soft, NT, ND, bowel sounds + Extremities: no edema, no cyanosis   The results of significant diagnostics from this hospitalization (including imaging, microbiology, ancillary and laboratory) are listed below for reference.    Significant Diagnostic Studies: CT HEAD WO CONTRAST (5MM)  Result Date: 06/11/2021 CLINICAL DATA:  Dilated pupil EXAM: CT HEAD WITHOUT CONTRAST TECHNIQUE: Contiguous axial images were obtained from the base of the skull through the vertex without intravenous contrast. COMPARISON:  CT brain 09/15/2020 FINDINGS:  Brain: No acute territorial infarction, hemorrhage or intracranial mass. Mild atrophy. Patchy white matter hypodensity consistent with chronic small vessel ischemic change. The ventricles are stable in size. Vascular: No hyperdense vessels.  Carotid vascular calcification Skull: Normal. Negative for fracture or focal lesion. Sinuses/Orbits: No acute finding. Other: None IMPRESSION: 1. No CT evidence for acute intracranial abnormality. 2. Atrophy and mild chronic small vessel ischemic changes of the white matter Electronically Signed   By: Donavan Foil M.D.   On: 06/11/2021 22:27   DG Chest Port 1 View  Result Date: 06/09/2021 CLINICAL DATA:  Questionable sepsis EXAM: PORTABLE CHEST 1 VIEW COMPARISON:  Portable exam 1716 hours compared to 05/02/2021 FINDINGS: Enlargement of cardiac silhouette. Atherosclerotic calcification aorta. Progressive airspace consolidation in RIGHT upper lobe, less in the mid to lower LEFT lung and at  RIGHT base. Associated pleural effusion/thickening in the RIGHT chest. No pneumothorax. Bones demineralized. IMPRESSION: Enlargement of cardiac silhouette. BILATERAL pulmonary infiltrates progressive in RIGHT upper lobe, favor pneumonia. Radiographic follow-up until resolution recommended; if these fail to resolve or if patient has worsening symptoms, recommend CT assessment as these infiltrates have been present for greater than 1 month. Electronically Signed   By: Lavonia Dana M.D.   On: 06/09/2021 17:29    Microbiology: Recent Results (from the past 240 hour(s))  Resp Panel by RT-PCR (Flu A&B, Covid) Nasopharyngeal Swab     Status: None   Collection Time: 06/09/21  4:39 PM   Specimen: Nasopharyngeal Swab; Nasopharyngeal(NP) swabs in vial transport medium  Result Value Ref Range Status   SARS Coronavirus 2 by RT PCR NEGATIVE NEGATIVE Final    Comment: (NOTE) SARS-CoV-2 target nucleic acids are NOT DETECTED.  The SARS-CoV-2 RNA is generally detectable in upper respiratory specimens during the acute phase of infection. The lowest concentration of SARS-CoV-2 viral copies this assay can detect is 138 copies/mL. A negative result does not preclude SARS-Cov-2 infection and should not be used as the sole basis for treatment or other patient management decisions. A negative result may occur with  improper specimen collection/handling, submission of specimen other than nasopharyngeal swab, presence of viral mutation(s) within the areas targeted by this assay, and inadequate number of viral copies(<138 copies/mL). A negative result must be combined with clinical observations, patient history, and epidemiological information. The expected result is Negative.  Fact Sheet for Patients:  EntrepreneurPulse.com.au  Fact Sheet for Healthcare Providers:  IncredibleEmployment.be  This test is no t yet approved or cleared by the [place] FDA and  has been  authorized for detection and/or diagnosis of SARS-CoV-2 by FDA under an Emergency Use Authorization (EUA). This EUA will remain  in effect (meaning this test can be used) for the duration of the COVID-19 declaration under Section 564(b)(1) of the Act, 21 U.S.C.section 360bbb-3(b)(1), unless the authorization is terminated  or revoked sooner.       Influenza A by PCR NEGATIVE NEGATIVE Final   Influenza B by PCR NEGATIVE NEGATIVE Final    Comment: (NOTE) The Xpert Xpress SARS-CoV-2/FLU/RSV plus assay is intended as an aid in the diagnosis of influenza from Nasopharyngeal swab specimens and should not be used as a sole basis for treatment. Nasal washings and aspirates are unacceptable for Xpert Xpress SARS-CoV-2/FLU/RSV testing.  Fact Sheet for Patients: EntrepreneurPulse.com.au  Fact Sheet for Healthcare Providers: IncredibleEmployment.be  This test is not yet approved or cleared by the [place] FDA and has been authorized for detection and/or diagnosis of SARS-CoV-2 by FDA under an Emergency Use Authorization (EUA).  This EUA will remain in effect (meaning this test can be used) for the duration of the COVID-19 declaration under Section 564(b)(1) of the Act, 21 U.S.C. section 360bbb-3(b)(1), unless the authorization is terminated or revoked.  Performed at [facility], [address] [place], [place] [address]   Urine Culture     Status: None   Collection Time: 06/09/21  4:49 PM   Specimen: In/Out Cath Urine  Result Value Ref Range Status   Specimen Description   Final    IN/OUT CATH URINE Performed at [facility], [address] [place], [place] [address]    Special Requests   Final    NONE Performed at [facility], [address] [place], [place] [address]    Culture   Final    NO GROWTH Performed at [facility], [address] [place], [place] [address]    Report Status 06/11/2021 FINAL  Final  Blood Culture  (routine x 2)     Status: None (Preliminary result)   Collection Time: 06/09/21  5:04 PM   Specimen: BLOOD RIGHT HAND  Result Value Ref Range Status   Specimen Description BLOOD RIGHT HAND  Final   Special Requests   Final    BOTTLES DRAWN AEROBIC AND ANAEROBIC Blood Culture adequate volume   Culture   Final    NO GROWTH 3 DAYS Performed at [facility], [address], [place], [place] [address]    Report Status PENDING  Incomplete  Blood Culture (routine x 2)     Status: None (Preliminary result)   Collection Time: 06/09/21  5:04 PM   Specimen: Right Antecubital; Blood  Result Value Ref Range Status   Specimen Description RIGHT ANTECUBITAL  Final   Special Requests   Final    BOTTLES DRAWN AEROBIC AND ANAEROBIC Blood Culture adequate volume   Culture   Final    NO GROWTH 3 DAYS Performed at [facility], [address] [place], [place] [address]    Report Status PENDING  Incomplete  MRSA Next Gen by PCR, Nasal     Status: None   Collection Time: 06/09/21  8:50 PM   Specimen: Nasal Mucosa; Nasal Swab  Result Value Ref Range Status   MRSA by PCR Next Gen NOT DETECTED NOT DETECTED Final    Comment: (NOTE) The GeneXpert MRSA Assay (FDA approved for NASAL specimens only), is one component of a comprehensive MRSA colonization surveillance program. It is not intended to diagnose MRSA infection nor to guide or monitor treatment for MRSA infections. Test performance is not FDA approved in patients less than 55 years old. Performed at [facility], [address], [place],  [address]      Labs: Basic Metabolic Panel: Recent Labs  Lab 06/09/21 1640 06/10/21 0530 06/11/21 0426 06/12/21 0411  NA 135 138 141 140  K 3.6 3.7 3.9 4.1  CL 100 107 111 112*  CO2 23 24 22 23   GLUCOSE 186* 108* 106* 99  BUN 30* 28* 29* 24*  CREATININE 1.54* 1.22* 1.45* 1.21*  CALCIUM 8.8* 8.4* 8.1* 8.7*  MG  --   --  2.0 2.0   Liver Function Tests: Recent Labs  Lab 06/09/21 1640  AST 24   ALT 27  ALKPHOS 74  BILITOT 1.2  PROT 6.4*  ALBUMIN 3.2*   No results for input(s): LIPASE, AMYLASE in the last 168 hours. No results for input(s): AMMONIA in the last 168 hours. CBC: Recent Labs  Lab 06/09/21 1640 06/10/21 0530  06/11/21 0426 06/12/21 0411  WBC 20.0* 13.6* 7.2 6.4  NEUTROABS 17.9*  --   --   --   HGB 10.8* 8.9* 9.1* 9.0*  HCT 33.9* 29.9* 30.5* 30.0*  MCV 97.7 99.7 102.0* 101.0*  PLT 162 130* 140* 176   Cardiac Enzymes: No results for input(s): CKTOTAL, CKMB, CKMBINDEX, TROPONINI in the last 168 hours. BNP: Invalid input(s): POCBNP CBG: No results for input(s): GLUCAP in the last 168 hours.  Time coordinating discharge:  36 minutes  Signed:  Orson Eva, DO [facility] Pager: [phone] 06/12/2021, 9:52 AM

## 2021-06-12 NOTE — Progress Notes (Signed)
ANTICOAGULATION CONSULT NOTE -   Pharmacy Consult for warfarin Indication: atrial fibrillation  Allergies  Allergen Reactions   Baclofen     Feels crazy   Niaspan [Niacin]    Flexeril [Cyclobenzaprine]     Feels crazy    Patient Measurements: Height: 5\' 4"  (162.6 cm) Weight: 69.7 kg (153 lb 10.6 oz) IBW/kg (Calculated) : 54.7 Heparin Dosing Weight:   Vital Signs: Temp: 97.8 F (36.6 C) (11/26 0430) Temp Source: Oral (11/26 0430) BP: 127/62 (11/26 0400) Pulse Rate: 83 (11/26 0400)  Labs: Recent Labs    06/09/21 1640 06/10/21 0530 06/11/21 0426 06/12/21 0411  HGB 10.8* 8.9* 9.1* 9.0*  HCT 33.9* 29.9* 30.5* 30.0*  PLT 162 130* 140* 176  APTT 44*  --   --   --   LABPROT 28.7* 28.2* 36.4* 32.7*  INR 2.7* 2.6* 3.7* 3.2*  CREATININE 1.54* 1.22* 1.45* 1.21*     Estimated Creatinine Clearance: 36.7 mL/min (A) (by C-G formula based on SCr of 1.21 mg/dL (H)).   Medical History: Past Medical History:  Diagnosis Date   Aortic stenosis    Arthritis    Chronic hypoxemic respiratory failure (HCC)    COPD (chronic obstructive pulmonary disease) (HCC)    Depression    HOH (hard of hearing)    Mitral regurgitation    Mixed hyperlipidemia    Permanent atrial fibrillation (HCC)    Ruptured disk    Uterine cancer (HCC)     Medications:  Medications Prior to Admission  Medication Sig Dispense Refill Last Dose   acetaminophen (TYLENOL) 325 MG tablet Take 650 mg by mouth every 6 (six) hours as needed for moderate pain.   06/09/2021   ALPRAZolam (XANAX) 0.25 MG tablet Take 1 tablet (0.25 mg total) by mouth at bedtime as needed for anxiety. 30 tablet 2 06/08/2021   atorvastatin (LIPITOR) 80 MG tablet TAKE ONE TABLET BY MOUTH DAILY AT 6 PM (Patient taking differently: Take 80 mg by mouth daily.) 30 tablet 0 27/12/2374   bismuth subsalicylate (PEPTO BISMOL) 262 MG/15ML suspension Take 30 mLs by mouth every 6 (six) hours as needed.   unknown   diclofenac sodium (VOLTAREN) 1 %  GEL Apply 2 g topically 4 (four) times daily. 350 g 3 unknown   diltiazem (CARDIZEM CD) 180 MG 24 hr capsule Take 1 capsule (180 mg total) by mouth daily. 90 capsule 1 Past Week   DULoxetine (CYMBALTA) 60 MG capsule Take 1 capsule (60 mg total) by mouth See admin instructions. Take 120 mg by mouth every evening (Patient taking differently: Take 60 mg by mouth 2 (two) times daily. Take 120 mg by mouth every evening)   06/08/2021   esomeprazole (NEXIUM) 40 MG capsule TAKE ONE CAPSULE BY MOUTH ONCE DAILY (MORNING PER PT) 30 capsule 5 06/08/2021   fluticasone (FLONASE) 50 MCG/ACT nasal spray USE ONE SPRAY IN EACH NOSTRIL ONCE DAILY. SHAKE GENTLY BEFORE USING. 16 g 5 Past Week   Fluticasone-Umeclidin-Vilant (TRELEGY ELLIPTA) 100-62.5-25 MCG/INH AEPB Inhale 1 puff into the lungs daily. 3 each 4 06/08/2021   guaiFENesin (MUCINEX) 600 MG 12 hr tablet Take 1 tablet (600 mg total) by mouth 2 (two) times daily as needed. 60 tablet 1 Past Week   levalbuterol (XOPENEX) 0.63 MG/3ML nebulizer solution Take 3 mLs (0.63 mg total) by nebulization every 6 (six) hours as needed for wheezing or shortness of breath. 3 mL 0 06/09/2021   loratadine (ALLERGY RELIEF) 10 MG tablet TAKE ONE TABLET BY MOUTH EVERY DAY (,EVENING) 30  tablet 5 06/08/2021   Magnesium Hydroxide (DULCOLAX PO) Take 25 mg by mouth daily.   unknown   metoprolol succinate (TOPROL-XL) 50 MG 24 hr tablet Take 1 tablet (50 mg total) by mouth every evening. Take with or immediately following a meal. 90 tablet 1 Past Week at 1800   NON FORMULARY Inhale 2-4 L into the lungs daily. oxygen   06/09/2021   nystatin (MYCOSTATIN/NYSTOP) powder Apply 1 application topically 3 (three) times daily. 45 g 1 unknown   nystatin cream (MYCOSTATIN) Apply 1 application topically 2 (two) times daily. 60 g 2 unknown   torsemide (DEMADEX) 20 MG tablet TAKE 1 AND 1/2 TABLETS TO 2 TABLETS BY MOUTH EVERY MORNING, 1& 1/2 IN PACK EXTRA IN BOTTLE IF NEEDED (Patient taking differently:  TAKE 1 AND 1/2 TABLETS TABLETS BY MOUTH EVERY MORNING, 1& 1/2 IN PACK EXTRA IN BOTTLE IF NEEDED) 45 tablet 0 06/08/2021   vitamin B-12 (CYANOCOBALAMIN) 1000 MCG tablet TAKE ONE TABLET BY MOUTH DAILY WITH BREAKFAST (MORNING) (Patient taking differently: Take 1,000 mcg by mouth daily.) 30 tablet 5 Past Week   Vitamin D, Ergocalciferol, (DRISDOL) 1.25 MG (50000 UNIT) CAPS capsule TAKE ONE CAPSULE BY MOUTH EVERY 7 DAYS. (SUNDAY MORNING) (Patient taking differently: Take 50,000 Units by mouth every 7 (seven) days. Mondays) 12 capsule 3 Past Week   warfarin (COUMADIN) 5 MG tablet TAKE 1 OR 2 TABLETS BY MOUTH AS DIRECTED BY WARFARIN CLINIC (,1-SU,M,W,TH,F,SA/ ,1 &,1/2-TU) (Patient taking differently: [facility] (,1-SU,M,W,TH,F,SA/ ,1 & 1/2-TU)) 180 tablet 0 06/07/2021 at 1800   albuterol (VENTOLIN HFA) 108 (90 Base) MCG/ACT inhaler INHALE 1-2 PUFFS INTO THE LUNGS EVERY 4 TO 6 HOURS AS NEEDED FOR SHORTNESS OF BREATH  (NEEDS TO BE SEEN BEFORE NEXT REFILL) (Patient not taking: Reported on 06/09/2021) 8.5 g 3 Not Taking   busPIRone (BUSPAR) 5 MG tablet Take 1 tablet (5 mg total) by mouth 3 (three) times daily as needed. (Patient not taking: Reported on 06/09/2021) 90 tablet 2 Not Taking   diazepam (VALIUM) 5 MG tablet Take 5 mg by mouth every 8 (eight) hours as needed for anxiety.      magic mouthwash (nystatin, lidocaine, diphenhydrAMINE) suspension Take 5 mLs by mouth 4 (four) times daily as needed for mouth pain. 180 mL 0    Multiple Vitamins-Minerals (CENTRUM SILVER PO) Take 1 tablet by mouth daily. (Patient not taking: Reported on 06/09/2021)   Not Taking   Spacer/Aero-Holding Chambers DEVI 1 Device by Does not apply route in the morning and at bedtime. 1 each 1     Assessment: Pharmacy consulted to dose warfarin in patient with atrial fibrillation.  Patient's home dose listed as 7.5 mg on Tue and 5 mg ROW.  INR 2.6> 3.7> 3.2 Hgb 9.1  Goal of Therapy:  INR 2-3 Monitor platelets by anticoagulation  protocol: Yes   Plan:  Hold warfarin x 1 dose. Monitor daily INR and s/s of bleeding.  Margot Ables, PharmD Clinical Pharmacist 06/12/2021 7:39 AM

## 2021-06-14 ENCOUNTER — Telehealth: Payer: Self-pay

## 2021-06-14 ENCOUNTER — Telehealth: Payer: Self-pay | Admitting: *Deleted

## 2021-06-14 ENCOUNTER — Other Ambulatory Visit: Payer: Self-pay | Admitting: Family

## 2021-06-14 LAB — CULTURE, BLOOD (ROUTINE X 2)
Culture: NO GROWTH
Culture: NO GROWTH
Special Requests: ADEQUATE
Special Requests: ADEQUATE

## 2021-06-14 NOTE — Progress Notes (Signed)
Description   INR 3.2 (goal 2.0-3.0) Too thin  Hold today's dose (06/14/21) , then decrease to  5 mg daily Recheck in 1 week.

## 2021-06-14 NOTE — Telephone Encounter (Signed)
Transition Care Management Follow-up Telephone Call Date of discharge and from where: 06/12/21 [facility] Diagnosis: Sepsis. How have you been since you were released from the hospital? Spoke with pt's daughter in law, [name] and she states pt is doing okay. Pt has around the clock care.  Any questions or concerns? No  Items Reviewed: Did the pt receive and understand the discharge instructions provided? Yes  Medications obtained and verified? Yes  Other? No  Any new allergies since your discharge? No  Dietary orders reviewed? Yes Do you have support at home? Yes   Home Care and Equipment/Supplies: Were home health services ordered? not applicable If so, what is the name of the agency?   Has the agency set up a time to come to the patient's home? not applicable Were any new equipment or medical supplies ordered?  No What is the name of the medical supply agency?  Were you able to get the supplies/equipment? not applicable Do you have any questions related to the use of the equipment or supplies? No  Functional Questionnaire: (I = Independent and D = Dependent) ADLs: D  Bathing/Dressing- D  Meal Prep- D  Eating- D  Maintaining continence- D  Transferring/Ambulation- D  Managing Meds- D  Follow up appointments reviewed:  PCP Hospital f/u appt confirmed? Yes  Scheduled to see Darla Lesches, FNP on 06/22/21 @ 10:35. [facility] f/u appt confirmed?  N/A   Are transportation arrangements needed? No  If their condition worsens, is the pt aware to call PCP or go to the Emergency Dept.? Yes Was the patient provided with contact information for the PCP's office or ED? Yes Was to pt encouraged to call back with questions or concerns? Yes

## 2021-06-14 NOTE — Progress Notes (Signed)
LMTCB

## 2021-06-14 NOTE — Telephone Encounter (Signed)
Fax received mdINR PT/INR self testing service Test date/time 06/12/21 1013 am INR 3.2

## 2021-06-18 ENCOUNTER — Other Ambulatory Visit: Payer: Self-pay | Admitting: Family

## 2021-06-22 ENCOUNTER — Inpatient Hospital Stay: Payer: Medicare Other | Admitting: Family Medicine

## 2021-06-24 ENCOUNTER — Inpatient Hospital Stay: Payer: Medicare Other | Admitting: Family Medicine

## 2021-07-05 ENCOUNTER — Telehealth: Payer: Self-pay | Admitting: *Deleted

## 2021-07-05 NOTE — Telephone Encounter (Signed)
Fax received mdINR PT/INR self testing service Test date/time 07/03/21 1137 am INR 2.3

## 2021-07-05 NOTE — Telephone Encounter (Signed)
Pt aware.

## 2021-07-05 NOTE — Telephone Encounter (Signed)
Description   INR  2.3 (goal 2.0-3.0) Perfect  Continue current dose of   5 mg daily Recheck in 4 week.

## 2021-07-06 ENCOUNTER — Other Ambulatory Visit: Payer: Self-pay | Admitting: Family

## 2021-07-07 NOTE — Progress Notes (Deleted)
Cardiology Office Note    Date:  07/07/2021   ID:  KLA BILY, DOB [birth year], MRN [id number]  PCP:  [name], FNP  Cardiologist: Rozann Lesches, MD    No chief complaint on file.   History of Present Illness:    Melinda Collins is a 80 y.o. female with past medical history of COPD/chronic hypoxic respiratory failure, permanent atrial fibrillation, aortic stenosis and history of uterine cancer who presents to the office today for 6-week follow-up.  She was last examined Dr. Domenic Polite in 05/2021 following a recent hospitalization for a COPD exacerbation and pneumonia which was complicated by rapid atrial fibrillation and metabolic encephalopathy. Recent echocardiogram had shown a preserved EF with moderate to severe pulmonary hypertension, moderate MR and moderate aortic stenosis.  Her heart rate was in the low 100's at time of her visit but she denied any associated symptoms, therefore she was continued on a combination of Cardizem CD 180 mg daily along with Toprol-XL 50 mg daily for rate control and Coumadin for anticoagulation with her INR values being followed by her PCP.  In the interim, she was hospitalized again from 11/23 - 06/12/2021 for sepsis in the setting of recurrent pneumonia and was appropriately treated with antibiotic therapy.  She did have some hemoptysis in the setting of pneumonia and supratherapeutic INR but this had resolved at the time of discharge.  Past Medical History:  Diagnosis Date   Aortic stenosis    Arthritis    Chronic hypoxemic respiratory failure (HCC)    COPD (chronic obstructive pulmonary disease) (HCC)    Depression    HOH (hard of hearing)    Mitral regurgitation    Mixed hyperlipidemia    Permanent atrial fibrillation (HCC)    Ruptured disk    Uterine cancer [facility])     Past Surgical History:  Procedure Laterality Date   ABDOMINAL HYSTERECTOMY     total   CATARACT EXTRACTION W/PHACO Left 08/25/2014   Procedure:  CATARACT EXTRACTION PHACO AND INTRAOCULAR LENS PLACEMENT ([place]);  Surgeon: Tonny Branch, MD;  Location: [facility];  Service: Ophthalmology;  Laterality: Left;  CDE 6.95   CATARACT EXTRACTION W/PHACO Right 09/11/2014   Procedure: CATARACT EXTRACTION PHACO AND INTRAOCULAR LENS PLACEMENT ([facility]);  Surgeon: Tonny Branch, MD;  Location: [facility];  Service: Ophthalmology;  Laterality: Right;  CDE:8.11   CERVICAL DISC SURGERY     x2   KYPHOPLASTY     SPINE SURGERY  2013   cervical fusion    Current Medications: Outpatient Medications Prior to Visit  Medication Sig Dispense Refill   acetaminophen (TYLENOL) 325 MG tablet Take 650 mg by mouth every 6 (six) hours as needed for moderate pain.     ALPRAZolam (XANAX) 0.25 MG tablet Take 1 tablet (0.25 mg total) by mouth at bedtime as needed for anxiety. 30 tablet 2   atorvastatin (LIPITOR) 80 MG tablet TAKE ONE TABLET BY MOUTH DAILY AT 6 PM 30 tablet 0   bismuth subsalicylate (PEPTO BISMOL) 262 MG/15ML suspension Take 30 mLs by mouth every 6 (six) hours as needed.     cefdinir (OMNICEF) 300 MG capsule Take 1 capsule (300 mg total) by mouth 2 (two) times daily. 8 capsule 0   diazepam (VALIUM) 5 MG tablet Take 5 mg by mouth every 8 (eight) hours as needed for anxiety.     diclofenac sodium (VOLTAREN) 1 % GEL Apply 2 g topically 4 (four) times daily. 350 g 3   diltiazem (CARDIZEM CD) 180 MG  24 hr capsule Take 1 capsule (180 mg total) by mouth daily. 90 capsule 1   doxycycline (VIBRA-TABS) 100 MG tablet Take 1 tablet (100 mg total) by mouth every 12 (twelve) hours. 8 tablet 0   DULoxetine (CYMBALTA) 60 MG capsule Take 1 capsule (60 mg total) by mouth See admin instructions. Take 120 mg by mouth every evening (Patient taking differently: Take 60 mg by mouth 2 (two) times daily. Take 120 mg by mouth every evening)     esomeprazole (NEXIUM) 40 MG capsule TAKE ONE CAPSULE BY MOUTH ONCE DAILY (MORNING PER PT) 30 capsule 5   fluticasone (FLONASE) 50 MCG/ACT nasal spray USE ONE  SPRAY IN EACH NOSTRIL ONCE DAILY. SHAKE GENTLY BEFORE USING. 16 g 5   Fluticasone-Umeclidin-Vilant (TRELEGY ELLIPTA) 100-62.5-25 MCG/INH AEPB Inhale 1 puff into the lungs daily. 3 each 4   guaiFENesin (MUCINEX) 600 MG 12 hr tablet Take 1 tablet (600 mg total) by mouth 2 (two) times daily as needed. 60 tablet 1   levalbuterol (XOPENEX) 0.63 MG/3ML nebulizer solution Take 3 mLs (0.63 mg total) by nebulization every 6 (six) hours as needed for wheezing or shortness of breath. 3 mL 0   loratadine (ALLERGY RELIEF) 10 MG tablet TAKE ONE TABLET BY MOUTH EVERY DAY (,EVENING) 30 tablet 5   Magnesium Hydroxide (DULCOLAX PO) Take 25 mg by mouth daily.     metoprolol succinate (TOPROL-XL) 50 MG 24 hr tablet Take 1 tablet (50 mg total) by mouth every evening. Take with or immediately following a meal. 90 tablet 1   NON FORMULARY Inhale 2-4 L into the lungs daily. oxygen     nystatin (MYCOSTATIN/NYSTOP) powder Apply 1 application topically 3 (three) times daily. 45 g 1   nystatin cream (MYCOSTATIN) Apply 1 application topically 2 (two) times daily. 60 g 2   Spacer/Aero-Holding Chambers DEVI 1 Device by Does not apply route in the morning and at bedtime. 1 each 1   torsemide (DEMADEX) 20 MG tablet TAKE 1 AND 1/2 TABLETS TO 2 TABLETS BY MOUTH EVERY MORNING, 1& 1/2 IN PACK EXTRA IN BOTTLE IF NEEDED 45 tablet 0   vitamin B-12 (CYANOCOBALAMIN) 1000 MCG tablet TAKE ONE TABLET BY MOUTH DAILY WITH BREAKFAST (MORNING) (Patient taking differently: Take 1,000 mcg by mouth daily.) 30 tablet 5   Vitamin D, Ergocalciferol, (DRISDOL) 1.25 MG (50000 UNIT) CAPS capsule TAKE ONE CAPSULE BY MOUTH EVERY 7 DAYS. (SUNDAY MORNING) (Patient taking differently: Take 50,000 Units by mouth every 7 (seven) days. Mondays) 12 capsule 3   warfarin (COUMADIN) 5 MG tablet TAKE 1 OR 2 TABLETS BY MOUTH AS DIRECTED BY [facility] (,1-SU,M,W,TH,F,SA/ ,1 &,1/2-TU) (Patient taking differently: [facility] (,1-SU,M,W,TH,F,SA/ ,1 & 1/2-TU)) 180  tablet 0   No facility-administered medications prior to visit.     Allergies:   Baclofen, Niaspan [niacin], and Flexeril [cyclobenzaprine]   Social History   Socioeconomic History   Marital status: Married    Spouse name: Not on file   Number of children: 1   Years of education: Not on file   Highest education level: Not on file  Occupational History   Occupation: retired  Tobacco Use   Smoking status: Former    Packs/day: 1.00    Types: Cigarettes    Start date: 07/18/1960   Smokeless tobacco: Never   Tobacco comments:    Qut last week.  08/10/16 restarted smoking cigarettes  Vaping Use   Vaping Use: Never used  Substance and Sexual Activity   Alcohol use: No  Alcohol/week: 0.0 standard drinks   Drug use: No   Sexual activity: Not on file  Other Topics Concern   Not on file  Social History Narrative   Single; disabled.    Son lives near and daughter in law is Therapist, sports - they help her daily   Social Determinants of Health   Financial Resource Strain: Low Risk    Difficulty of Paying Living Expenses: Not very hard  Food Insecurity: No Food Insecurity   Worried About Charity fundraiser in the Last Year: Never true   Arboriculturist in the Last Year: Never true  Transportation Needs: No Transportation Needs   Lack of Transportation (Medical): No   Lack of Transportation (Non-Medical): No  Physical Activity: Inactive   Days of Exercise per Week: 0 days   Minutes of Exercise per Session: 0 min  Stress: Stress Concern Present   Feeling of Stress : Rather much  Social Connections: Socially Isolated   Frequency of Communication with Friends and Family: Three times a week   Frequency of Social Gatherings with Friends and Family: Three times a week   Attends Religious Services: Never   Active Member of Clubs or Organizations: No   Attends Music therapist: Never   Marital Status: Never married     Family History:  The patient's ***family history includes  COPD in her brother; Cancer in her brother, brother, and father; Coronary artery disease in an other family member; Diabetes in her mother; Heart attack in her father; Heart attack (age of onset: 20) in her brother; Heart disease in her brother and mother; Kidney disease in her father; Stroke in her mother.   Review of Systems:    Please see the history of present illness.     All other systems reviewed and are otherwise negative except as noted above.   Physical Exam:    VS:  There were no vitals taken for this visit.   General: Well developed, well nourished,female appearing in no acute distress. Head: Normocephalic, atraumatic. Neck: No carotid bruits. JVD not elevated.  Lungs: Respirations regular and unlabored, without wheezes or rales.  Heart: ***Regular rate and rhythm. No S3 or S4.  No murmur, no rubs, or gallops appreciated. Abdomen: Appears non-distended. No obvious abdominal masses. Msk:  Strength and tone appear normal for age. No obvious joint deformities or effusions. Extremities: No clubbing or cyanosis. No edema.  Distal pedal pulses are 2+ bilaterally. Neuro: Alert and oriented X 3. Moves all extremities spontaneously. No focal deficits noted. Psych:  Responds to questions appropriately with a normal affect. Skin: No rashes or lesions noted  Wt Readings from Last 3 Encounters:  06/10/21 153 lb 10.6 oz (69.7 kg)  05/27/21 145 lb (65.8 kg)  05/26/21 145 lb 3.2 oz (65.9 kg)        Studies/Labs Reviewed:   EKG:  EKG is*** ordered today.  The ekg ordered today demonstrates ***  Recent Labs: 05/01/2021: B Natriuretic Peptide 527.0 06/09/2021: ALT 27 06/12/2021: BUN 24; Creatinine, Ser 1.21; Hemoglobin 9.0; Magnesium 2.0; Platelets 176; Potassium 4.1; Sodium 140   Lipid Panel    Component Value Date/Time   CHOL 154 06/18/2019 1558   CHOL 121 08/06/2013 1123   TRIG 191 (H) 06/18/2019 1558   TRIG 302 (H) 02/16/2016 1247   TRIG 136 08/06/2013 1123   HDL 59  06/18/2019 1558   HDL 51 02/16/2016 1247   HDL 48 08/06/2013 1123   CHOLHDL 2.6 06/18/2019 1558  LDLCALC 64 06/18/2019 1558   LDLCALC 46 08/06/2013 1123    Additional studies/ records that were reviewed today include:   Echocardiogram: 04/2021 IMPRESSIONS     1. Left ventricular ejection fraction, by estimation, is 55 to 60%. The  left ventricle has normal function. The left ventricle has no regional  wall motion abnormalities. There is mild left ventricular hypertrophy.  Left ventricular diastolic parameters  are indeterminate.   2. Right ventricular systolic function is normal. The right ventricular  size is normal. There is moderately elevated pulmonary artery systolic  pressure. The estimated right ventricular systolic pressure is 09.3 mmHg.   3. Left atrial size was severely dilated.   4. Right atrial size was severely dilated.   5. The mitral valve is degenerative. Moderate mitral annular  calcification.      Moderate mitral valve regurgitation. Eccentric posterior directed jet,  may be underestimating due to eccentric jet.   6. Tricuspid valve regurgitation is mild to moderate.   7. The aortic valve is tricuspid. There is moderate calcification of the  aortic valve. Aortic valve regurgitation is mild. Moderate aortic valve  stenosis. Vmax 2.9 m/s, MG 77mmHg, AVA 1.2 cm^2, DI 0.46   8. The inferior vena cava is dilated in size with <50% respiratory  variability, suggesting right atrial pressure of 15 mmHg.   9. Evidence of atrial level shunting detected by color flow Doppler.   Assessment:    No diagnosis found.   Plan:   In order of problems listed above:  ***    Shared Decision Making/Informed Consent:   {Are you ordering a CV Procedure (e.g. stress test, cath, DCCV, TEE, etc)?   Press F2        :[id number]}    Medication Adjustments/Labs and Tests Ordered: Current medicines are reviewed at length with the patient today.  Concerns regarding medicines are  outlined above.  Medication changes, Labs and Tests ordered today are listed in the Patient Instructions below. There are no Patient Instructions on file for this visit.   Signed, Erma Heritage, PA-C  07/07/2021 11:52 AM    [place] [address] [place],  [address] Phone: [phone] Fax: [phone]

## 2021-07-08 ENCOUNTER — Ambulatory Visit: Payer: Medicare Other | Admitting: Student

## 2021-07-09 ENCOUNTER — Encounter (HOSPITAL_COMMUNITY): Payer: Self-pay | Admitting: Radiology

## 2021-07-15 ENCOUNTER — Telehealth: Payer: Self-pay | Admitting: *Deleted

## 2021-07-15 NOTE — Telephone Encounter (Signed)
Fax received mdINR PT/INR self testing service Test date/time 07/11/21 928 am INR 2.2

## 2021-07-15 NOTE — Telephone Encounter (Signed)
Pts daughter in law made aware.

## 2021-07-15 NOTE — Telephone Encounter (Signed)
Description   INR 2.2 (at goal)  Continue 5 mg daily.

## 2021-07-16 ENCOUNTER — Other Ambulatory Visit: Payer: Self-pay | Admitting: Family

## 2021-07-21 ENCOUNTER — Telehealth: Payer: Self-pay | Admitting: *Deleted

## 2021-07-21 NOTE — Telephone Encounter (Signed)
Fax received mdINR PT/INR self testing service Test date/time 07/18/21 1011 am INR 2.4

## 2021-07-21 NOTE — Telephone Encounter (Signed)
Description   INR 2.4 (at goal)  Continue 5 mg daily.  Recheck in 1 to 2 weeks, does home INRs    Caryl Pina, MD [facility] 07/21/2021, 3:38 PM

## 2021-07-21 NOTE — Telephone Encounter (Signed)
Lmtcb.

## 2021-07-27 ENCOUNTER — Other Ambulatory Visit: Payer: Self-pay | Admitting: Orthopaedic Surgery

## 2021-07-27 DIAGNOSIS — M4326 Fusion of spine, lumbar region: Secondary | ICD-10-CM

## 2021-07-27 DIAGNOSIS — M5116 Intervertebral disc disorders with radiculopathy, lumbar region: Secondary | ICD-10-CM

## 2021-07-27 NOTE — Telephone Encounter (Signed)
[name] made aware and understood.

## 2021-07-28 ENCOUNTER — Telehealth: Payer: Self-pay | Admitting: *Deleted

## 2021-07-28 NOTE — Telephone Encounter (Signed)
Carol aware  

## 2021-07-28 NOTE — Telephone Encounter (Signed)
Will close encounter

## 2021-07-28 NOTE — Telephone Encounter (Signed)
Description   INR 2.6 (at goal)  Continue 5 mg daily.  Recheck in 1 to 2 weeks, does home INRs

## 2021-07-28 NOTE — Telephone Encounter (Signed)
Fax received mdINR PT/INR self testing service Test date/time 07/25/21 1018 am INR 2.6

## 2021-08-04 ENCOUNTER — Other Ambulatory Visit: Payer: Self-pay | Admitting: Family

## 2021-08-04 DIAGNOSIS — J441 Chronic obstructive pulmonary disease with (acute) exacerbation: Secondary | ICD-10-CM

## 2021-08-06 ENCOUNTER — Telehealth: Payer: Self-pay | Admitting: *Deleted

## 2021-08-06 NOTE — Telephone Encounter (Signed)
Fax received mdINR PT/INR self testing service Test date/time 08/01/21 1028 am INR 2.7

## 2021-08-06 NOTE — Telephone Encounter (Signed)
Left detailed message per Daughter inlaw who takes care of her inr

## 2021-08-06 NOTE — Telephone Encounter (Signed)
Description   INR 2.7 (at goal)  Continue 5 mg daily.  Recheck in 1 to 2 weeks, does home INRs

## 2021-08-13 ENCOUNTER — Other Ambulatory Visit: Payer: Self-pay | Admitting: Family

## 2021-08-16 ENCOUNTER — Telehealth: Payer: Self-pay | Admitting: *Deleted

## 2021-08-16 NOTE — Telephone Encounter (Signed)
Patient aware and verbalized understanding. °

## 2021-08-16 NOTE — Telephone Encounter (Signed)
Description   INR 2.5  (at goal)  Continue 5 mg daily.  Recheck in 1 to 2 weeks, does home INRs

## 2021-08-16 NOTE — Telephone Encounter (Signed)
Fax received mdINR PT/INR self testing service Test date/time 08/15/21 1110am INR 2.5

## 2021-08-25 ENCOUNTER — Ambulatory Visit (INDEPENDENT_AMBULATORY_CARE_PROVIDER_SITE_OTHER): Payer: Medicare Other | Admitting: Family

## 2021-08-25 ENCOUNTER — Encounter: Payer: Self-pay | Admitting: Family

## 2021-08-25 VITALS — BP 120/69 | HR 66 | Temp 97.5°F | Ht 64.0 in | Wt 158.6 lb

## 2021-08-25 DIAGNOSIS — G8929 Other chronic pain: Secondary | ICD-10-CM

## 2021-08-25 DIAGNOSIS — J9611 Chronic respiratory failure with hypoxia: Secondary | ICD-10-CM

## 2021-08-25 DIAGNOSIS — E782 Mixed hyperlipidemia: Secondary | ICD-10-CM

## 2021-08-25 DIAGNOSIS — Z515 Encounter for palliative care: Secondary | ICD-10-CM

## 2021-08-25 DIAGNOSIS — J439 Emphysema, unspecified: Secondary | ICD-10-CM | POA: Diagnosis not present

## 2021-08-25 DIAGNOSIS — M545 Low back pain, unspecified: Secondary | ICD-10-CM

## 2021-08-25 DIAGNOSIS — I700 Atherosclerosis of aorta: Secondary | ICD-10-CM

## 2021-08-25 DIAGNOSIS — I4891 Unspecified atrial fibrillation: Secondary | ICD-10-CM

## 2021-08-25 DIAGNOSIS — F339 Major depressive disorder, recurrent, unspecified: Secondary | ICD-10-CM

## 2021-08-25 DIAGNOSIS — F411 Generalized anxiety disorder: Secondary | ICD-10-CM

## 2021-08-25 MED ORDER — TRAMADOL HCL 50 MG PO TABS: 50.0000 mg | ORAL_TABLET | Freq: Three times a day (TID) | ORAL | 2 refills | Status: AC | PRN

## 2021-08-25 NOTE — Progress Notes (Signed)
Subjective:    Patient ID: Melinda Collins, female    DOB: [birth year], 81 y.o.   MRN: [id number]  Chief Complaint  Patient presents with   Medical Management of Chronic Issues   Back Pain   Pt presents to the office today for chronic follow up. She is followed by Palliative care. She is followed by Ortho and getting back injections and is scheduled for a MRI tomorrow. She is followed by Cardiologists.    She reports her breathing is better and only using her oxygen only at night. She has COPD and quit smoking two months ago.    She has aortic atherosclerosis and takes Lipitor daily.   Pain in her back is worse. She can not tolerate the oxycodone. She has only been taking tylenol. Only takes xanax as needed.  Back Pain This is a chronic problem. The current episode started more than 1 year ago. The problem occurs intermittently. The problem has been waxing and waning since onset. The pain is present in the lumbar spine. The quality of the pain is described as aching. The pain is at a severity of 10/10. The pain is moderate. Associated symptoms include leg pain. She has tried analgesics for the symptoms. The treatment provided mild relief.  Hyperlipidemia This is a chronic problem. The current episode started more than 1 year ago. The problem is controlled. Exacerbating diseases include obesity. Associated symptoms include leg pain. Current antihyperlipidemic treatment includes statins. The current treatment provides mild improvement of lipids. Risk factors for coronary artery disease include dyslipidemia, hypertension, a sedentary lifestyle and post-menopausal.  Anxiety Presents for follow-up visit. Symptoms include depressed mood, excessive worry, irritability and restlessness. Symptoms occur occasionally. The severity of symptoms is moderate.    Depression        This is a chronic problem.  The current episode started more than 1 year ago.   The onset quality is gradual.   The problem  occurs intermittently.  Associated symptoms include fatigue, irritable, restlessness and sad.  Associated symptoms include no helplessness, no hopelessness and no decreased interest.  Past medical history includes anxiety.      Review of Systems  Constitutional:  Positive for fatigue and irritability.  Musculoskeletal:  Positive for back pain.  Psychiatric/Behavioral:  Positive for depression.   All other systems reviewed and are negative.     Objective:   Physical Exam Vitals reviewed.  Constitutional:      General: She is irritable. She is not in acute distress.    Appearance: She is well-developed. She is obese.  HENT:     Head: Normocephalic and atraumatic.     Right Ear: Tympanic membrane normal.     Left Ear: Tympanic membrane normal.  Eyes:     Pupils: Pupils are equal, round, and reactive to light.  Neck:     Thyroid: No thyromegaly.  Cardiovascular:     Rate and Rhythm: Normal rate and regular rhythm.     Heart sounds: Murmur heard.  Pulmonary:     Effort: Pulmonary effort is normal. No respiratory distress.     Breath sounds: Rhonchi present. No wheezing.  Abdominal:     General: Bowel sounds are normal. There is no distension.     Palpations: Abdomen is soft.     Tenderness: There is no abdominal tenderness.  Musculoskeletal:        General: Tenderness present.     Cervical back: Normal range of motion and neck supple.  Comments: Pain in lumbar with flexion and extension  Skin:    General: Skin is warm and dry.  Neurological:     Mental Status: She is alert and oriented to person, place, and time.     Cranial Nerves: No cranial nerve deficit.     Motor: Weakness present.     Gait: Gait abnormal (wheelchair).     Deep Tendon Reflexes: Reflexes are normal and symmetric.  Psychiatric:        Behavior: Behavior normal.        Thought Content: Thought content normal.        Judgment: Judgment normal.     BP 120/69    Pulse 66    Temp (!) 97.5 F (36.4  C) (Temporal)    Ht '5\' 4"'  (1.626 m)    Wt 158 lb 9.6 oz (71.9 kg)    BMI 27.22 kg/m       Assessment & Plan:  Melinda Collins comes in today with chief complaint of Medical Management of Chronic Issues and Back Pain   Diagnosis and orders addressed:  1. Aortic atherosclerosis (HCC) - CMP14+EGFR - CBC with Differential/Platelet  2. Atrial fibrillation with RVR (HCC) - CMP14+EGFR - CBC with Differential/Platelet  3. Chronic respiratory failure with hypoxia (HCC) - CMP14+EGFR - CBC with Differential/Platelet  4. Pulmonary emphysema, unspecified emphysema type ([place]) - CMP14+EGFR - CBC with Differential/Platelet  5. Chronic midline low back pain without sciatica Stop oxycodone and start Ultram as needed Tylenol as needed  - traMADol (ULTRAM) 50 MG tablet; Take 1-2 tablets (50-100 mg total) by mouth every 8 (eight) hours as needed for up to 5 days.  Dispense: 90 tablet; Refill: 2 - CMP14+EGFR - CBC with Differential/Platelet  6. Depression, recurrent ([place]) - CMP14+EGFR - CBC with Differential/Platelet  7. GAD (generalized anxiety disorder) - CMP14+EGFR - CBC with Differential/Platelet  8. Mixed hyperlipidemia - CMP14+EGFR - CBC with Differential/Platelet  9. Palliative care patient - traMADol (ULTRAM) 50 MG tablet; Take 1-2 tablets (50-100 mg total) by mouth every 8 (eight) hours as needed for up to 5 days.  Dispense: 90 tablet; Refill: 2 - CMP14+EGFR - CBC with Differential/Platelet   Labs pending Health Maintenance reviewed Diet and exercise encouraged  Follow up plan: 3 months    Evelina Dun, FNP

## 2021-08-25 NOTE — Patient Instructions (Signed)

## 2021-08-26 ENCOUNTER — Telehealth: Payer: Self-pay | Admitting: *Deleted

## 2021-08-26 ENCOUNTER — Ambulatory Visit
Admission: RE | Admit: 2021-08-26 | Discharge: 2021-08-26 | Disposition: A | Discharge status: Home / Self Care | Payer: Medicare Other | Source: Ambulatory Visit | Attending: Orthopaedic Surgery | Admitting: Orthopaedic Surgery

## 2021-08-26 ENCOUNTER — Other Ambulatory Visit: Payer: Self-pay | Admitting: Family

## 2021-08-26 ENCOUNTER — Other Ambulatory Visit: Payer: Self-pay | Admitting: Orthopaedic Surgery

## 2021-08-26 DIAGNOSIS — M4326 Fusion of spine, lumbar region: Secondary | ICD-10-CM

## 2021-08-26 DIAGNOSIS — M5116 Intervertebral disc disorders with radiculopathy, lumbar region: Secondary | ICD-10-CM

## 2021-08-26 LAB — CMP14+EGFR
ALT: 18 IU/L (ref 0–32)
AST: 14 IU/L (ref 0–40)
Albumin/Globulin Ratio: 1.9 (ref 1.2–2.2)
Albumin: 3.8 g/dL (ref 3.7–4.7)
Alkaline Phosphatase: 107 IU/L (ref 44–121)
BUN/Creatinine Ratio: 24 (ref 12–28)
BUN: 41 mg/dL — ABNORMAL HIGH (ref 8–27)
Bilirubin Total: 0.2 mg/dL (ref 0.0–1.2)
CO2: 21 mmol/L (ref 20–29)
Calcium: 9.2 mg/dL (ref 8.7–10.3)
Chloride: 108 mmol/L — ABNORMAL HIGH (ref 96–106)
Creatinine, Ser: 1.74 mg/dL — ABNORMAL HIGH (ref 0.57–1.00)
Globulin, Total: 2 g/dL (ref 1.5–4.5)
Glucose: 140 mg/dL — ABNORMAL HIGH (ref 70–99)
Potassium: 4.8 mmol/L (ref 3.5–5.2)
Sodium: 144 mmol/L (ref 134–144)
Total Protein: 5.8 g/dL — ABNORMAL LOW (ref 6.0–8.5)
eGFR: 29 mL/min/{1.73_m2} — ABNORMAL LOW (ref >59)

## 2021-08-26 LAB — CBC WITH DIFFERENTIAL/PLATELET
Basophils Absolute: 0 10*3/uL (ref 0.0–0.2)
Basos: 1 %
EOS (ABSOLUTE): 0.2 10*3/uL (ref 0.0–0.4)
Eos: 3 %
Hematocrit: 40.3 % (ref 34.0–46.6)
Hemoglobin: 12.9 g/dL (ref 11.1–15.9)
Immature Grans (Abs): 0 10*3/uL (ref 0.0–0.1)
Immature Granulocytes: 0 %
Lymphocytes Absolute: 1.2 10*3/uL (ref 0.7–3.1)
Lymphs: 16 %
MCH: 28.7 pg (ref 26.6–33.0)
MCHC: 32 g/dL (ref 31.5–35.7)
MCV: 90 fL (ref 79–97)
Monocytes Absolute: 0.6 10*3/uL (ref 0.1–0.9)
Monocytes: 8 %
Neutrophils Absolute: 5.5 10*3/uL (ref 1.4–7.0)
Neutrophils: 72 %
Platelets: 218 10*3/uL (ref 150–450)
RBC: 4.49 x10E6/uL (ref 3.77–5.28)
RDW: 14.8 % (ref 11.7–15.4)
WBC: 7.6 10*3/uL (ref 3.4–10.8)

## 2021-08-26 NOTE — Progress Notes (Signed)
Sent to my chart per patient

## 2021-08-26 NOTE — Progress Notes (Signed)
Description   INR 2.6  (at goal)  Continue 5 mg daily.  Recheck in 1 to 2 weeks, does home INRs

## 2021-08-26 NOTE — Progress Notes (Signed)
Cardiology Office Note    Date:  08/27/2021   ID:  Melinda Collins, DOB [birth year], MRN [id number]  PCP:  [name], FNP  Cardiologist: Rozann Lesches, MD    Chief Complaint  Patient presents with   Follow-up    6 week visit    History of Present Illness:    Melinda Collins is a 81 y.o. female with past medical history of COPD/chronic hypoxic respiratory failure, permanent atrial fibrillation, aortic stenosis and history of uterine cancer who presents to the office today for 6-week follow-up.   She was last examined by Dr. Domenic Polite in 05/2021 following a recent hospitalization for a COPD exacerbation and pneumonia which was complicated by rapid atrial fibrillation and metabolic encephalopathy. Recent echocardiogram had shown a preserved EF with moderate to severe pulmonary hypertension, moderate MR and moderate aortic stenosis. Her heart rate was in the low 100's at time of her visit but she denied any associated symptoms, therefore she was continued on a combination of Cardizem CD 180 mg daily along with Toprol-XL 50 mg daily for rate control and Coumadin for anticoagulation with her INR values being followed by her PCP.   In the interim, she was hospitalized again from 11/23 - 06/12/2021 for sepsis in the setting of recurrent pneumonia and was appropriately treated with antibiotic therapy. She did have some hemoptysis in the setting of pneumonia and supratherapeutic INR but this had resolved at the time of discharge.  In talking with the patient and her daughter-in-law today, her main issue at this time is worsening back pain and she is being followed by [facility] in [place]. Reports having an MRI earlier this week and is awaiting the results. She does have baseline dyspnea on exertion but is not overly active at baseline due to her back pain. No specific orthopnea or PND. No recent chest pain. She does experience occasional palpitations which are typically  most notable at night. She has been taking her Cardizem CD at nighttime with improvement in her symptoms.  Past Medical History:  Diagnosis Date   Aortic stenosis    Arthritis    Chronic hypoxemic respiratory failure (HCC)    COPD (chronic obstructive pulmonary disease) (HCC)    Depression    HOH (hard of hearing)    Mitral regurgitation    Mixed hyperlipidemia    Permanent atrial fibrillation (HCC)    Ruptured disk    Uterine cancer [facility])     Past Surgical History:  Procedure Laterality Date   ABDOMINAL HYSTERECTOMY     total   CATARACT EXTRACTION W/PHACO Left 08/25/2014   Procedure: CATARACT EXTRACTION PHACO AND INTRAOCULAR LENS PLACEMENT (Central);  Surgeon: Tonny Branch, MD;  Location: [facility];  Service: Ophthalmology;  Laterality: Left;  CDE 6.95   CATARACT EXTRACTION W/PHACO Right 09/11/2014   Procedure: CATARACT EXTRACTION PHACO AND INTRAOCULAR LENS PLACEMENT (IOC);  Surgeon: Tonny Branch, MD;  Location: [facility];  Service: Ophthalmology;  Laterality: Right;  CDE:8.11   CERVICAL DISC SURGERY     x2   KYPHOPLASTY     SPINE SURGERY  2013   cervical fusion    Current Medications: Outpatient Medications Prior to Visit  Medication Sig Dispense Refill   acetaminophen (TYLENOL) 325 MG tablet Take 650 mg by mouth every 6 (six) hours as needed for moderate pain.     ALPRAZolam (XANAX) 0.25 MG tablet Take 1 tablet (0.25 mg total) by mouth at bedtime as needed for anxiety. 30 tablet 2  atorvastatin (LIPITOR) 80 MG tablet TAKE ONE TABLET BY MOUTH DAILY AT 6 PM 30 tablet 0   bismuth subsalicylate (PEPTO BISMOL) 262 MG/15ML suspension Take 30 mLs by mouth every 6 (six) hours as needed.     diclofenac sodium (VOLTAREN) 1 % GEL Apply 2 g topically 4 (four) times daily. 350 g 3   diltiazem (CARDIZEM CD) 180 MG 24 hr capsule Take 1 capsule (180 mg total) by mouth daily. 90 capsule 1   DULoxetine (CYMBALTA) 60 MG capsule Take 1 capsule (60 mg total) by mouth See admin instructions. Take 120 mg by  mouth every evening (Patient taking differently: Take 60 mg by mouth 2 (two) times daily. Take 120 mg by mouth every evening)     esomeprazole (NEXIUM) 40 MG capsule TAKE ONE CAPSULE BY MOUTH ONCE DAILY (MORNING PER PT) 30 capsule 5   fluticasone (FLONASE) 50 MCG/ACT nasal spray USE ONE SPRAY IN EACH NOSTRIL ONCE DAILY. SHAKE GENTLY BEFORE USING. 16 g 5   Fluticasone-Umeclidin-Vilant (TRELEGY ELLIPTA) 100-62.5-25 MCG/INH AEPB Inhale 1 puff into the lungs daily. 3 each 4   guaiFENesin (MUCINEX) 600 MG 12 hr tablet Take 1 tablet (600 mg total) by mouth 2 (two) times daily as needed. 60 tablet 1   levalbuterol (XOPENEX) 0.63 MG/3ML nebulizer solution INHALE CONTENTS OF 1 VIAL IN NEBULIZER EVERY 6 HOURS AS NEEDED FOR WHEEZING OR SHORTNESS OF BREATH 120 mL 5   loratadine (ALLERGY RELIEF) 10 MG tablet TAKE ONE TABLET BY MOUTH EVERY DAY (,EVENING) 30 tablet 5   Magnesium Hydroxide (DULCOLAX PO) Take 25 mg by mouth daily.     NON FORMULARY Inhale 2-4 L into the lungs daily. oxygen     nystatin (MYCOSTATIN/NYSTOP) powder Apply 1 application topically 3 (three) times daily. 45 g 1   nystatin cream (MYCOSTATIN) Apply 1 application topically 2 (two) times daily. 60 g 2   Spacer/Aero-Holding Chambers DEVI 1 Device by Does not apply route in the morning and at bedtime. 1 each 1   torsemide (DEMADEX) 20 MG tablet TAKE 1 AND 1/2 TABLETS TO 2 TABLETS BY MOUTH EVERY MORNING, 1&1/2 IN PACK; EXTRA IN BOTTLE IF NEEDED 45 tablet 0   traMADol (ULTRAM) 50 MG tablet Take 1-2 tablets (50-100 mg total) by mouth every 8 (eight) hours as needed for up to 5 days. 90 tablet 2   vitamin B-12 (CYANOCOBALAMIN) 1000 MCG tablet TAKE ONE TABLET BY MOUTH DAILY WITH BREAKFAST (MORNING) (Patient taking differently: Take 1,000 mcg by mouth daily.) 30 tablet 5   Vitamin D, Ergocalciferol, (DRISDOL) 1.25 MG (50000 UNIT) CAPS capsule TAKE ONE CAPSULE BY MOUTH EVERY 7 DAYS. (SUNDAY MORNING) (Patient taking differently: Take 50,000 Units by mouth  every 7 (seven) days. Mondays) 12 capsule 3   warfarin (COUMADIN) 5 MG tablet TAKE 1 OR 2 TABLETS BY MOUTH AS DIRECTED BY [facility] (,1-SU,M,W,TH,F,SA/ ,1 &,1/2-TU) (Patient taking differently: [facility] (,1-SU,M,W,TH,F,SA/ ,1 & 1/2-TU)) 180 tablet 0   metoprolol succinate (TOPROL-XL) 50 MG 24 hr tablet Take 1 tablet (50 mg total) by mouth every evening. Take with or immediately following a meal. 90 tablet 1   No facility-administered medications prior to visit.     Allergies:   Baclofen, Niaspan [niacin], and Flexeril [cyclobenzaprine]   Social History   Socioeconomic History   Marital status: Married    Spouse name: Not on file   Number of children: 1   Years of education: Not on file   Highest education level: Not on file  Occupational  History   Occupation: retired  Tobacco Use   Smoking status: Former    Packs/day: 1.00    Types: Cigarettes    Start date: 07/18/1960   Smokeless tobacco: Never   Tobacco comments:    Qut last week.  08/10/16 restarted smoking cigarettes  Vaping Use   Vaping Use: Never used  Substance and Sexual Activity   Alcohol use: No    Alcohol/week: 0.0 standard drinks   Drug use: No   Sexual activity: Not on file  Other Topics Concern   Not on file  Social History Narrative   Single; disabled.    Son lives near and daughter in law is Therapist, sports - they help her daily   Social Determinants of Health   Financial Resource Strain: Low Risk    Difficulty of Paying Living Expenses: Not very hard  Food Insecurity: No Food Insecurity   Worried About Charity fundraiser in the Last Year: Never true   Arboriculturist in the Last Year: Never true  Transportation Needs: No Transportation Needs   Lack of Transportation (Medical): No   Lack of Transportation (Non-Medical): No  Physical Activity: Inactive   Days of Exercise per Week: 0 days   Minutes of Exercise per Session: 0 min  Stress: Stress Concern Present   Feeling of Stress : Rather much   Social Connections: Socially Isolated   Frequency of Communication with Friends and Family: Three times a week   Frequency of Social Gatherings with Friends and Family: Three times a week   Attends Religious Services: Never   Active Member of Clubs or Organizations: No   Attends Music therapist: Never   Marital Status: Never married     Family History:  The patient's family history includes COPD in her brother; Cancer in her brother, brother, and father; Coronary artery disease in an other family member; Diabetes in her mother; Heart attack in her father; Heart attack (age of onset: 62) in her brother; Heart disease in her brother and mother; Kidney disease in her father; Stroke in her mother.   Review of Systems:    Please see the history of present illness.     All other systems reviewed and are otherwise negative except as noted above.   Physical Exam:    VS:  BP 120/70    Pulse (!) 54    Ht 5\' 4"  (1.626 m)    Wt 161 lb (73 kg)    SpO2 98%    BMI 27.64 kg/m    General: Pleasant elderly female appearing in no acute distress. Sitting in wheelchair.  Head: Normocephalic, atraumatic. Neck: No carotid bruits. JVD not elevated.  Lungs: Respirations regular and unlabored, without wheezes or rales.  Heart: Irregularly irregular. 2/6 SEM along RUSB.  Abdomen: Appears non-distended. No obvious abdominal masses. Msk:  Strength and tone appear normal for age. No obvious joint deformities or effusions. Extremities: No clubbing or cyanosis. No pitting edema.  Distal pedal pulses are 2+ bilaterally. Neuro: Alert and oriented X 3. Moves all extremities spontaneously. No focal deficits noted. Psych:  Responds to questions appropriately with a normal affect. Skin: No rashes or lesions noted  Wt Readings from Last 3 Encounters:  08/27/21 161 lb (73 kg)  08/25/21 158 lb 9.6 oz (71.9 kg)  06/10/21 153 lb 10.6 oz (69.7 kg)     Studies/Labs Reviewed:   EKG:  EKG is not ordered  today.   Recent Labs: 05/01/2021: B Natriuretic Peptide 527.0  06/12/2021: Magnesium 2.0 08/25/2021: ALT 18; BUN 41; Creatinine, Ser 1.74; Hemoglobin 12.9; Platelets 218; Potassium 4.8; Sodium 144   Lipid Panel    Component Value Date/Time   CHOL 154 06/18/2019 1558   CHOL 121 08/06/2013 1123   TRIG 191 (H) 06/18/2019 1558   TRIG 302 (H) 02/16/2016 1247   TRIG 136 08/06/2013 1123   HDL 59 06/18/2019 1558   HDL 51 02/16/2016 1247   HDL 48 08/06/2013 1123   CHOLHDL 2.6 06/18/2019 1558   LDLCALC 64 06/18/2019 1558   LDLCALC 46 08/06/2013 1123    Additional studies/ records that were reviewed today include:   Echocardiogram: 04/2021 IMPRESSIONS     1. Left ventricular ejection fraction, by estimation, is 55 to 60%. The  left ventricle has normal function. The left ventricle has no regional  wall motion abnormalities. There is mild left ventricular hypertrophy.  Left ventricular diastolic parameters  are indeterminate.   2. Right ventricular systolic function is normal. The right ventricular  size is normal. There is moderately elevated pulmonary artery systolic  pressure. The estimated right ventricular systolic pressure is 15.9 mmHg.   3. Left atrial size was severely dilated.   4. Right atrial size was severely dilated.   5. The mitral valve is degenerative. Moderate mitral annular  calcification.      Moderate mitral valve regurgitation. Eccentric posterior directed jet,  may be underestimating due to eccentric jet.   6. Tricuspid valve regurgitation is mild to moderate.   7. The aortic valve is tricuspid. There is moderate calcification of the  aortic valve. Aortic valve regurgitation is mild. Moderate aortic valve  stenosis. Vmax 2.9 m/s, MG 70mmHg, AVA 1.2 cm^2, DI 0.46   8. The inferior vena cava is dilated in size with <50% respiratory  variability, suggesting right atrial pressure of 15 mmHg.   9. Evidence of atrial level shunting detected by color flow Doppler.    Assessment:    1. Permanent atrial fibrillation ([place])   2. Nonrheumatic aortic valve stenosis   3. Pulmonary emphysema, unspecified emphysema type ([place])   4. Pulmonary hypertension ([place])   5. Mixed hyperlipidemia      Plan:   In order of problems listed above:  1. Permanent Atrial Fibrillation - Her rates have significantly improved since her last office visit and are currently in the 50's to 60's today. She does continue to have some palpitations which are typically most notable at night but I recommended that we not further titrate her AV nodal blocking agents given her current heart rate. Continue current medication regimen with Toprol-XL 50 mg daily and Cardizem CD 180 mg daily. - She remains on Coumadin for anticoagulation and does check her INR values at home and reports back to her PCP for management. INR 2.6 earlier this week. No reports of active bleeding. Hgb had normalized to 12.9 by recent labs earlier this week.   2. Aortic stenosis - This was moderate by most recent echocardiogram in 04/2021.  Would anticipate repeat imaging later this year.  3. COPD/Chronic Hypoxic Respiratory Failure/Pulmonary HTN - She reports significant improvement in her dyspnea as she did quit smoking last year following her recurrent hospitalizations. She did have moderately elevated PASP of 59.6 mmHg by echocardiogram in 04/2021 and felt to be secondary to her underlying lung issues. She is no longer having to use supplemental oxygen. Remains on Trelegy along with PRN Xopenex.  4. HLD - Followed by her PCP. She remains on Atorvastatin 80 mg daily.  Medication Adjustments/Labs and Tests Ordered: Current medicines are reviewed at length with the patient today.  Concerns regarding medicines are outlined above.  Medication changes, Labs and Tests ordered today are listed in the Patient Instructions below. Patient Instructions  Medication Instructions:   Continue current medication regimen.     Testing/Procedures:  None at this time.    Follow-Up: At [facility], you and your health needs are our priority.  As part of our continuing mission to provide you with exceptional heart care, we have created designated Provider Care Teams.  These Care Teams include your primary Cardiologist (physician) and Advanced Practice Providers (APPs -  Physician Assistants and Nurse Practitioners) who all work together to provide you with the care you need, when you need it.  We recommend signing up for the patient portal called "MyChart".  Sign up information is provided on this After Visit Summary.  MyChart is used to connect with patients for Virtual Visits (Telemedicine).  Patients are able to view lab/test results, encounter notes, upcoming appointments, etc.  Non-urgent messages can be sent to your provider as well.   To learn more about what you can do with MyChart, go to [email or web address].    Your next appointment:   6 month(s)  The format for your next appointment:   In Person  Provider:   You may see Rozann Lesches, MD or one of the following Advanced Practice Providers on your designated Care Team:   Bernerd Pho, PA-C  Ermalinda Barrios, PA-C {     Signed, Waynetta Pean  08/27/2021 4:50 PM    [place]. [address] [place], [place] [address] Phone: [phone] Fax: [phone]

## 2021-08-26 NOTE — Telephone Encounter (Signed)
Fax received mdINR PT/INR self testing service Test date/time 08/22/21 1108 am INR 2.6

## 2021-08-27 ENCOUNTER — Other Ambulatory Visit: Payer: Self-pay | Admitting: Family

## 2021-08-27 ENCOUNTER — Ambulatory Visit (INDEPENDENT_AMBULATORY_CARE_PROVIDER_SITE_OTHER): Payer: Medicare Other | Admitting: Student

## 2021-08-27 ENCOUNTER — Encounter: Payer: Self-pay | Admitting: Student

## 2021-08-27 ENCOUNTER — Other Ambulatory Visit: Payer: Self-pay

## 2021-08-27 VITALS — BP 120/70 | HR 54 | Ht 64.0 in | Wt 161.0 lb

## 2021-08-27 DIAGNOSIS — I4821 Permanent atrial fibrillation: Secondary | ICD-10-CM

## 2021-08-27 DIAGNOSIS — E782 Mixed hyperlipidemia: Secondary | ICD-10-CM

## 2021-08-27 DIAGNOSIS — I350 Nonrheumatic aortic (valve) stenosis: Secondary | ICD-10-CM | POA: Diagnosis not present

## 2021-08-27 DIAGNOSIS — J439 Emphysema, unspecified: Secondary | ICD-10-CM | POA: Diagnosis not present

## 2021-08-27 DIAGNOSIS — I272 Pulmonary hypertension, unspecified: Secondary | ICD-10-CM

## 2021-08-27 NOTE — Patient Instructions (Signed)
Medication Instructions:   Continue current medication regimen.    Testing/Procedures:  None at this time.    Follow-Up: At [facility], you and your health needs are our priority.  As part of our continuing mission to provide you with exceptional heart care, we have created designated Provider Care Teams.  These Care Teams include your primary Cardiologist (physician) and Advanced Practice Providers (APPs -  Physician Assistants and Nurse Practitioners) who all work together to provide you with the care you need, when you need it.  We recommend signing up for the patient portal called "MyChart".  Sign up information is provided on this After Visit Summary.  MyChart is used to connect with patients for Virtual Visits (Telemedicine).  Patients are able to view lab/test results, encounter notes, upcoming appointments, etc.  Non-urgent messages can be sent to your provider as well.   To learn more about what you can do with MyChart, go to [email or web address].    Your next appointment:   6 month(s)  The format for your next appointment:   In Person  Provider:   You may see Rozann Lesches, MD or one of the following Advanced Practice Providers on your designated Care Team:   Bernerd Pho, PA-C  Ermalinda Barrios, [name] {

## 2021-08-30 ENCOUNTER — Telehealth: Payer: Self-pay | Admitting: *Deleted

## 2021-08-30 NOTE — Telephone Encounter (Signed)
Description   INR 2.4  (at goal)  Continue 5 mg daily.  Recheck in 1 to 2 weeks, does home INRs

## 2021-08-30 NOTE — Telephone Encounter (Signed)
Fax received mdINR PT/INR self testing service Test date/time 08/29/21 1150 am INR 2.4

## 2021-08-30 NOTE — Telephone Encounter (Signed)
Pt aware.

## 2021-09-09 ENCOUNTER — Other Ambulatory Visit: Payer: Self-pay | Admitting: Family

## 2021-09-09 ENCOUNTER — Telehealth: Payer: Self-pay | Admitting: *Deleted

## 2021-09-09 NOTE — Progress Notes (Signed)
Sent through [employer] per daughter inlaw

## 2021-09-09 NOTE — Progress Notes (Signed)
Description   INR 2.8 (at goal)  Continue 5 mg daily.  Recheck in 1 to 2 weeks, does home INRs

## 2021-09-09 NOTE — Telephone Encounter (Signed)
Fax received mdINR PT/INR self testing service Test date/time 09/03/21 1043 am INR 2.8

## 2021-09-10 ENCOUNTER — Other Ambulatory Visit: Payer: Self-pay | Admitting: Family

## 2021-09-10 DIAGNOSIS — J439 Emphysema, unspecified: Secondary | ICD-10-CM

## 2021-09-14 ENCOUNTER — Telehealth: Payer: Self-pay | Admitting: *Deleted

## 2021-09-14 NOTE — Telephone Encounter (Signed)
Description   INR 2.7 (at goal)  Continue 5 mg daily.  Recheck in 1 to 2 weeks, does home INRs

## 2021-09-14 NOTE — Telephone Encounter (Signed)
Sent through [employer] per Daughter inlaw

## 2021-09-14 NOTE — Telephone Encounter (Signed)
Fax received mdINR PT/INR self testing service Test date/time 09/12/21 1107 am INR 2.7

## 2021-09-21 ENCOUNTER — Telehealth: Payer: Self-pay | Admitting: *Deleted

## 2021-09-21 NOTE — Telephone Encounter (Signed)
Fax received ?mdINR PT/INR self testing service ?Test date/time 09/19/21 834 am ?INR 2.5 ?

## 2021-09-21 NOTE — Telephone Encounter (Signed)
Description   ?INR 2.5 (at goal) ? ?Continue 5 mg daily.  ?Recheck in 1 to 2 weeks, does home INRs ?  ? ? ?

## 2021-09-21 NOTE — Telephone Encounter (Signed)
Sent through [employer] per daughter inlaw ?

## 2021-09-22 ENCOUNTER — Other Ambulatory Visit: Payer: Self-pay | Admitting: Family

## 2021-09-22 DIAGNOSIS — F339 Major depressive disorder, recurrent, unspecified: Secondary | ICD-10-CM

## 2021-09-22 DIAGNOSIS — G8929 Other chronic pain: Secondary | ICD-10-CM

## 2021-09-22 DIAGNOSIS — M4716 Other spondylosis with myelopathy, lumbar region: Secondary | ICD-10-CM

## 2021-09-28 ENCOUNTER — Telehealth: Payer: Self-pay | Admitting: *Deleted

## 2021-09-28 NOTE — Telephone Encounter (Signed)
Description   ?INR 2.8 (at goal) ? ?Continue 5 mg daily.  ?Recheck in 1 to 2 weeks, does home INRs ?  ? ? ?

## 2021-09-28 NOTE — Telephone Encounter (Signed)
Fax received ?mdINR PT/INR self testing service ?Test date/time 09/26/21 1151 am ?INR 2.8 ?

## 2021-09-28 NOTE — Telephone Encounter (Signed)
Sent through [employer] per Daughter inlaw ?

## 2021-10-01 ENCOUNTER — Ambulatory Visit: Payer: Medicare Other | Admitting: Family

## 2021-10-07 ENCOUNTER — Telehealth: Payer: Self-pay | Admitting: *Deleted

## 2021-10-07 NOTE — Telephone Encounter (Signed)
Description   ?INR 2.4 (at goal) ? ?Continue 5 mg daily.  ?Recheck in 1 to 2 weeks, does home INRs ?  ? ? ?

## 2021-10-07 NOTE — Telephone Encounter (Signed)
PT AWARE OF RESULTS AND RECOMMENDATIONS ?

## 2021-10-07 NOTE — Telephone Encounter (Signed)
Fax received ?mdINR PT/INR self testing service ?Test date/time 10/03/21 1134 ?INR 2.4 ?

## 2021-10-12 ENCOUNTER — Telehealth: Payer: Self-pay | Admitting: *Deleted

## 2021-10-12 NOTE — Telephone Encounter (Signed)
Fax received ?mdINR PT/INR self testing service ?Test date/time 10/10/21 1100 am ?INR 2.4 ?

## 2021-10-12 NOTE — Telephone Encounter (Signed)
INR sent to mychart per patient daughter inlaw  ?

## 2021-10-12 NOTE — Telephone Encounter (Signed)
Description   ?INR 2.4 (at goal) ? ?Continue 5 mg daily.  ?Recheck in 1 to 2 weeks, does home INRs ?  ? ? ?

## 2021-10-14 ENCOUNTER — Other Ambulatory Visit: Payer: Self-pay | Admitting: Family

## 2021-10-15 ENCOUNTER — Encounter: Payer: Self-pay | Admitting: Family

## 2021-10-15 ENCOUNTER — Ambulatory Visit (INDEPENDENT_AMBULATORY_CARE_PROVIDER_SITE_OTHER): Payer: Medicare Other | Admitting: Family

## 2021-10-15 VITALS — BP 119/87 | HR 73 | Temp 98.1°F | Ht 64.0 in | Wt 167.2 lb

## 2021-10-15 DIAGNOSIS — G8929 Other chronic pain: Secondary | ICD-10-CM | POA: Diagnosis not present

## 2021-10-15 DIAGNOSIS — I509 Heart failure, unspecified: Secondary | ICD-10-CM | POA: Insufficient documentation

## 2021-10-15 DIAGNOSIS — N184 Chronic kidney disease, stage 4 (severe): Secondary | ICD-10-CM | POA: Diagnosis not present

## 2021-10-15 DIAGNOSIS — M545 Low back pain, unspecified: Secondary | ICD-10-CM | POA: Diagnosis not present

## 2021-10-15 NOTE — Patient Instructions (Signed)
Chronic Kidney Disease, Adult Chronic kidney disease (CKD) occurs when the kidneys are slowly and permanently damaged over a long period of time. The kidneys are a pair of organs that do many important jobs in the body, including: Removing waste and extra fluid from the blood to make urine. Making hormones that maintain the amount of fluid in tissues and blood vessels. Maintaining the right amount of fluids and chemicals in the body. A small amount of kidney damage may not cause problems, but a large amount of damage may make it hard or impossible for the kidneys to work right. Steps must be taken to slow kidney damage or to stop it from getting worse. If steps are not taken, the kidneys may stop working permanently (end-stage renal disease, or ESRD). Most of the time, CKD does not go away, but it can often becontrolled. People who have CKD are usually able to live full lives. What are the causes? The most common causes of this condition are diabetes and high blood pressure (hypertension). Other causes include: Cardiovascular diseases. These affect the heart and blood vessels. Kidney diseases. These include: Glomerulonephritis, or inflammation of the tiny filters in the kidneys. Interstitial nephritis. This is swelling of the small tubes of the kidneys and of the surrounding structures. Polycystic kidney disease, in which clusters of fluid-filled sacs form within the kidneys. Renal vascular disease. This includes disorders that affect the arteries and veins of the kidneys. Diseases that affect the body's defense system (immune system). A problem with urine flow. This may be caused by: Kidney stones. Cancer. An enlarged prostate, in males. A kidney infection or urinary tract infection (UTI) that keeps coming back. Vasculitis. This is swelling or inflammation of the blood vessels. What increases the risk? Your chances of having kidney disease increase with age. The following factors may make  you more likely to develop this condition: A family history of kidney disease or kidney failure. Kidney failure means the kidneys can no longer work right. Certain genetic diseases. Taking medicines often that are damaging to the kidneys. Being around or being in contact with toxic substances. Obesity. A history of tobacco use. What are the signs or symptoms? Symptoms of this condition include: Feeling very tired (lethargic) and having less energy. Swelling, or edema, of the face, legs, ankles, or feet. Nausea or vomiting, or loss of appetite. Confusion or trouble concentrating. Muscle twitches and cramps, especially in the legs. Dry, itchy skin. A metallic taste in the mouth. Producing less urine, or producing more urine (especially at night). Shortness of breath. Trouble sleeping. CKD may also result in not having enough red blood cells or hemoglobin in the blood (anemia) or having weak bones (bone disease). Symptoms develop slowly and may not be obvious until the kidney damage becomessevere. It is possible to have kidney disease for years without having symptoms. How is this diagnosed? This condition may be diagnosed based on: Blood tests. Urine tests. Imaging tests, such as an ultrasound or a CT scan. A kidney biopsy. This involves removing a sample of kidney tissue to be looked at under a microscope. Results from these tests will help to determine how serious the CKD is. How is this treated? There is no cure for most cases of this condition, but treatment usually relieves symptoms and prevents or slows the worsening of the disease. Treatment may include: Diet changes, which may require you to avoid alcohol and foods that are high in salt, potassium, phosphorous, and protein. Medicines. These may: Lower blood   pressure. Control blood sugar (glucose). Relieve anemia. Relieve swelling. Protect your bones. Improve the balance of salts and minerals in your blood  (electrolytes). Dialysis, which is a type of treatment that removes toxic waste from the body. It may be needed if you have kidney failure. Managing any other conditions that are causing your CKD or making it worse. Follow these instructions at home: Medicines Take over-the-counter and prescription medicines only as told by your health care provider. The amount of some medicines that you take may need to be changed. Do not take any new medicines unless approved by your health care provider. Many medicines can make kidney damage worse. Do not take any vitamin and mineral supplements unless approved by your health care provider. Many nutritional supplements can make kidney damage worse. Lifestyle  Do not use any products that contain nicotine or tobacco, such as cigarettes, e-cigarettes, and chewing tobacco. If you need help quitting, ask your health care provider. If you drink alcohol: Limit how much you use to: 0-1 drink a day for women who are not pregnant. 0-2 drinks a day for men. Know how much alcohol is in your drink. In the [place] one drink equals one 12 oz bottle of beer (355 mL), one 5 oz glass of wine (148 mL), or one 1 oz glass of hard liquor (44 mL). Maintain a healthy weight. If you need help, ask your health care provider.  General instructions  Follow instructions from your health care provider about eating or drinking restrictions, including any prescribed diet. Track your blood pressure at home. Report changes in your blood pressure as told. If you are being treated for diabetes, track your blood glucose levels as told. Start or continue an exercise plan. Exercise at least 30 minutes a day, 5 days a week. Keep your immunizations up to date as told. Keep all follow-up visits. This is important.  Where to find more information American Association of Kidney Patients: www.aakp.org National Kidney Foundation: www.kidney.org American Kidney Fund: www.akfinc.org Life Options:  www.lifeoptions.org Kidney School: www.kidneyschool.org Contact a health care provider if: Your symptoms get worse. You develop new symptoms. Get help right away if: You develop symptoms of ESRD. These include: Headaches. Numbness in your hands or feet. Easy bruising. Frequent hiccups. Chest pain. Shortness of breath. Lack of menstrual periods, in women. You have a fever. You are producing less urine than usual. You have pain or bleeding when you urinate or when you have a bowel movement. These symptoms may represent a serious problem that is an emergency. Do not wait to see if the symptoms will go away. Get medical help right away. Call your local emergency services (911 in the [place] Do not drive yourself to the hospital. Summary Chronic kidney disease (CKD) occurs when the kidneys become damaged slowly over a long period of time. The most common causes of this condition are diabetes and high blood pressure (hypertension). There is no cure for most cases of CKD, but treatment usually relieves symptoms and prevents or slows the worsening of the disease. Treatment may include a combination of lifestyle changes, medicines, and dialysis. This information is not intended to replace advice given to you by your health care provider. Make sure you discuss any questions you have with your healthcare provider. Document Revised: 10/09/2019 Document Reviewed: 10/09/2019 Elsevier Patient Education  2022 Elsevier Inc.  

## 2021-10-15 NOTE — Progress Notes (Signed)
? ?Subjective:  ? ? Patient ID: Melinda Collins, female    DOB: [birth year], 81 y.o.   MRN: [id number] ? ?Chief Complaint  ?Patient presents with  ? Follow-up  ?  Per last lab Creatinine elevated- Avoid NSAID's, if no fluid may need to decrease demadex to once a day. Follow up in 1 month.  ?CBC stable   ? ?PT presents to the office today for follow up on elevated creatinine. We decreased her demadex just as needed. She has only taken 3 times in the last month. She has gained 9 lb, but states she has been increasing her eating. Denies any fluid.  ? ?She has an appointment with Neurosurgeon for back injection. States her pain is 10 out 10 and can not get up and walk.  ?Congestive Heart Failure ?Presents for follow-up visit. Associated symptoms include fatigue. Pertinent negatives include no edema, near-syncope or palpitations. The symptoms have been stable.  ? ? ? ?Review of Systems  ?Constitutional:  Positive for fatigue.  ?Cardiovascular:  Negative for palpitations and near-syncope.  ?All other systems reviewed and are negative. ? ?   ?Objective:  ? Physical Exam ?Vitals reviewed.  ?Constitutional:   ?   General: She is not in acute distress. ?   Appearance: She is well-developed.  ?HENT:  ?   Head: Normocephalic and atraumatic.  ?   Right Ear: Tympanic membrane normal.  ?   Left Ear: Tympanic membrane normal.  ?Eyes:  ?   Pupils: Pupils are equal, round, and reactive to light.  ?Neck:  ?   Thyroid: No thyromegaly.  ?Cardiovascular:  ?   Rate and Rhythm: Normal rate and regular rhythm.  ?   Heart sounds: Normal heart sounds. No murmur heard. ?Pulmonary:  ?   Effort: Pulmonary effort is normal. No respiratory distress.  ?   Breath sounds: Normal breath sounds. No wheezing.  ?Abdominal:  ?   General: Bowel sounds are normal. There is no distension.  ?   Palpations: Abdomen is soft.  ?   Tenderness: There is no abdominal tenderness.  ?Musculoskeletal:     ?   General: No tenderness. Normal range of motion.  ?    Cervical back: Normal range of motion and neck supple.  ?   Comments: No swelling noted  ?Skin: ?   General: Skin is warm and dry.  ?Neurological:  ?   Mental Status: She is alert and oriented to person, place, and time.  ?   Cranial Nerves: No cranial nerve deficit.  ?   Motor: Weakness (wheelchair bound) present.  ?   Gait: Gait abnormal.  ?   Deep Tendon Reflexes: Reflexes are normal and symmetric.  ?Psychiatric:     ?   Behavior: Behavior normal.     ?   Thought Content: Thought content normal.     ?   Judgment: Judgment normal.  ? ? ? ? ?BP 119/87   Pulse 73   Temp 98.1 ?F (36.7 ?C) (Temporal)   Ht '5\' 4"'  (1.626 m)   Wt 167 lb 3.2 oz (75.8 kg)   BMI 28.70 kg/m?  ? ?   ?Assessment & Plan:  ?Melinda Collins comes in today with chief complaint of Follow-up (Per last lab Creatinine elevated- Avoid NSAID's, if no fluid may need to decrease demadex to once a day. Follow up in 1 month. /CBC stable ) ? ? ?Diagnosis and orders addressed: ? ?1. Chronic midline low back pain without sciatica ?- BMP8+EGFR ? ?  2. Chronic kidney disease, stage 4 (severe) (HCC) ?- BMP8+EGFR ? ?3. Congenital heart failure ([place]) ?Continue Demadex as needed  ?Low salt diet ? ? ?Labs pending ?Health Maintenance reviewed ?Diet and exercise encouraged ? ?Follow up plan: ?3 months  ? ?Evelina Dun, FNP ? ? ?

## 2021-10-16 LAB — BMP8+EGFR
BUN/Creatinine Ratio: 16 (ref 12–28)
BUN: 21 mg/dL (ref 8–27)
CO2: 20 mmol/L (ref 20–29)
Calcium: 8.7 mg/dL (ref 8.7–10.3)
Chloride: 106 mmol/L (ref 96–106)
Creatinine, Ser: 1.35 mg/dL — ABNORMAL HIGH (ref 0.57–1.00)
Glucose: 156 mg/dL — ABNORMAL HIGH (ref 70–99)
Potassium: 4.4 mmol/L (ref 3.5–5.2)
Sodium: 145 mmol/L — ABNORMAL HIGH (ref 134–144)
eGFR: 40 mL/min/{1.73_m2} — ABNORMAL LOW (ref >59)

## 2021-10-20 ENCOUNTER — Other Ambulatory Visit: Payer: Self-pay | Admitting: Family

## 2021-10-25 ENCOUNTER — Other Ambulatory Visit: Payer: Self-pay | Admitting: Family

## 2021-10-25 ENCOUNTER — Other Ambulatory Visit: Payer: Self-pay | Admitting: Nurse Practitioner

## 2021-10-25 ENCOUNTER — Telehealth: Payer: Self-pay | Admitting: Family

## 2021-10-25 DIAGNOSIS — Z515 Encounter for palliative care: Secondary | ICD-10-CM

## 2021-10-25 DIAGNOSIS — G8929 Other chronic pain: Secondary | ICD-10-CM

## 2021-10-25 NOTE — Progress Notes (Signed)
INR 2.1 (at goal)  ?Continue 5 mg daily ?Recheck in 1-2 weeks. ?Home INRs ?

## 2021-10-25 NOTE — Telephone Encounter (Signed)
Fax received ?mdINR PT/INR self testing service ?Test date/time 10/17/21 9:27am ?INR 2.1  ? ?Results came through the fax on 04/07 ?

## 2021-10-26 NOTE — Telephone Encounter (Signed)
Per daughter in law wants sent to [place] message sent  ?

## 2021-11-01 ENCOUNTER — Telehealth: Payer: Self-pay | Admitting: *Deleted

## 2021-11-01 NOTE — Telephone Encounter (Signed)
Fax received ?mdINR PT/INR self testing service ?Test date/time 10/31/21 10:00 am ?INR 2.8 ?

## 2021-11-01 NOTE — Telephone Encounter (Signed)
Continue coumadin as is °

## 2021-11-01 NOTE — Telephone Encounter (Signed)
Sent to [employer] per daughter inlaw  ? ?

## 2021-11-05 ENCOUNTER — Other Ambulatory Visit: Payer: Self-pay | Admitting: Family

## 2021-11-05 DIAGNOSIS — F339 Major depressive disorder, recurrent, unspecified: Secondary | ICD-10-CM

## 2021-11-05 DIAGNOSIS — G8929 Other chronic pain: Secondary | ICD-10-CM

## 2021-11-05 DIAGNOSIS — M4716 Other spondylosis with myelopathy, lumbar region: Secondary | ICD-10-CM

## 2021-11-15 ENCOUNTER — Ambulatory Visit (INDEPENDENT_AMBULATORY_CARE_PROVIDER_SITE_OTHER): Payer: Medicare Other | Admitting: Family Medicine

## 2021-11-15 ENCOUNTER — Encounter: Payer: Self-pay | Admitting: Family Medicine

## 2021-11-15 ENCOUNTER — Telehealth: Payer: Self-pay | Admitting: Family

## 2021-11-15 VITALS — BP 118/72 | HR 73 | Temp 97.4°F | Ht 64.0 in

## 2021-11-15 DIAGNOSIS — H6501 Acute serous otitis media, right ear: Secondary | ICD-10-CM | POA: Diagnosis not present

## 2021-11-15 MED ORDER — AMOXICILLIN-POT CLAVULANATE 875-125 MG PO TABS
1.0000 | ORAL_TABLET | Freq: Two times a day (BID) | ORAL | 0 refills | Status: AC
Start: 2021-11-15 — End: 2021-11-22

## 2021-11-15 NOTE — Progress Notes (Signed)
? ?  Acute Office Visit ? ?Subjective:  ? ?  ?Patient ID: Melinda Collins, female    DOB: [birth year], 81 y.o.   MRN: [id number] ? ?Chief Complaint  ?Patient presents with  ? Ear Pain  ? ? ?HPI ?Here with daughter. Patient is in today for right ear pain x 2 days. She reprots intermittent sharp shooting pains. She has not a runny nose for some time. She reports a headache on the left side as well. Denies fever, chills, cough, or drainage from the ear. She has been taking tylenol and mucinex.  ? ?ROS ?As per HPI.  ? ?   ?Objective:  ?  ?BP 118/72   Pulse 73   Temp (!) 97.4 ?F (36.3 ?C) (Temporal)   Ht '5\' 4"'$  (1.626 m)   SpO2 95%   BMI 28.70 kg/m?  ? ? ?Physical Exam ?Vitals and nursing note reviewed.  ?Constitutional:   ?   General: She is not in acute distress. ?   Appearance: She is not ill-appearing, toxic-appearing or diaphoretic.  ?HENT:  ?   Right Ear: Ear canal and external ear normal. A middle ear effusion is present. Tympanic membrane is bulging. Tympanic membrane is not perforated, erythematous or retracted.  ?   Left Ear: Ear canal and external ear normal. A middle ear effusion is present. Tympanic membrane is not perforated, erythematous, retracted or bulging.  ?   Nose: Congestion present.  ?   Mouth/Throat:  ?   Mouth: Mucous membranes are moist.  ?   Pharynx: Oropharynx is clear.  ?Cardiovascular:  ?   Rate and Rhythm: Normal rate and regular rhythm.  ?   Heart sounds: Normal heart sounds. No murmur heard. ?Pulmonary:  ?   Effort: Pulmonary effort is normal. No respiratory distress.  ?   Breath sounds: Normal breath sounds.  ?Skin: ?   General: Skin is warm and dry.  ?Neurological:  ?   Mental Status: She is alert and oriented to person, place, and time.  ?   Gait: Gait abnormal (arrives in wheelchair).  ?Psychiatric:     ?   Mood and Affect: Mood normal.     ?   Behavior: Behavior normal.  ? ? ?No results found for any visits on 11/15/21. ? ? ?   ?Assessment & Plan:  ? ?Melinda Collins was seen today for  ear pain. ? ?Diagnoses and all orders for this visit: ? ?Non-recurrent acute serous otitis media of right ear ?Augmentin as below. Tylenol prn for pain.  ?-     amoxicillin-clavulanate (AUGMENTIN) 875-125 MG tablet; Take 1 tablet by mouth 2 (two) times daily for 7 days. ? ?Return if symptoms worsen or fail to improve. ? ?The patient indicates understanding of these issues and agrees with the plan. ? ?Gwenlyn Perking, FNP ? ? ?

## 2021-11-15 NOTE — Patient Instructions (Signed)

## 2021-11-15 NOTE — Telephone Encounter (Signed)
Fax received ?mdINR PT/INR self testing service ?Test date/time 11/07/21 8:23am ?INR 2.2  ?

## 2021-11-15 NOTE — Telephone Encounter (Signed)
Description   ?INR 2.5 (at goal) ? ?Continue 5 mg daily.  ?Recheck in 1 to 2 weeks, does home INRs ?  ? ? ?

## 2021-11-15 NOTE — Telephone Encounter (Signed)
Fax received ?mdINR PT/INR self testing service ?Test date/time 11/14/21 8:23 ?INR 2.5 ? ?INR results from 04/23 and 04/30  ?

## 2021-11-15 NOTE — Telephone Encounter (Signed)
LMOVM to [name] to continue current 5 mg dose & rck in 1-2 wks, call back if she has any questions ?

## 2021-11-18 ENCOUNTER — Other Ambulatory Visit: Payer: Self-pay | Admitting: Family

## 2021-11-18 DIAGNOSIS — M545 Low back pain, unspecified: Secondary | ICD-10-CM

## 2021-11-18 DIAGNOSIS — Z515 Encounter for palliative care: Secondary | ICD-10-CM

## 2021-11-23 ENCOUNTER — Telehealth: Payer: Self-pay | Admitting: *Deleted

## 2021-11-23 NOTE — Telephone Encounter (Signed)
Description   ?INR 2.3 (at goal) ? ?Continue 5 mg daily.  ?Recheck in 1 to 2 weeks, does home INRs ?  ? ? ?

## 2021-11-23 NOTE — Telephone Encounter (Signed)
Fax received ?mdINR PT/INR self testing service ?Test date/time 11/21/21 931 am ?INR 2.3 ?

## 2021-11-23 NOTE — Telephone Encounter (Signed)
Left message to return call 

## 2021-11-30 ENCOUNTER — Telehealth: Payer: Self-pay | Admitting: *Deleted

## 2021-11-30 NOTE — Telephone Encounter (Signed)
Description   ?INR 2.6 (at goal) ? ?Continue 5 mg daily.  ?Recheck in 1 to 2 weeks, does home INRs ?  ? ? ?

## 2021-11-30 NOTE — Telephone Encounter (Signed)
Left message informing pt of results and recommendations. Advised to call back if needed. ?

## 2021-11-30 NOTE — Telephone Encounter (Signed)
Fax received ?mdINR PT/INR self testing service ?Test date/time 11/28/21 1029 am ?INR 2.6 ?

## 2021-12-07 ENCOUNTER — Telehealth: Payer: Self-pay | Admitting: *Deleted

## 2021-12-07 NOTE — Telephone Encounter (Signed)
INR 2.5 (Goal 2-3) Continue 5 mg daily Recheck in 1 to 2 weeks

## 2021-12-07 NOTE — Telephone Encounter (Signed)
Fax received mdINR PT/INR self testing service Test date/time 12/05/21 840 am INR 2.5

## 2021-12-08 ENCOUNTER — Other Ambulatory Visit: Payer: Self-pay | Admitting: Family

## 2021-12-08 DIAGNOSIS — M4716 Other spondylosis with myelopathy, lumbar region: Secondary | ICD-10-CM

## 2021-12-08 DIAGNOSIS — F339 Major depressive disorder, recurrent, unspecified: Secondary | ICD-10-CM

## 2021-12-08 DIAGNOSIS — M545 Low back pain, unspecified: Secondary | ICD-10-CM

## 2021-12-14 ENCOUNTER — Telehealth: Payer: Self-pay | Admitting: *Deleted

## 2021-12-14 NOTE — Telephone Encounter (Signed)
Fax received 12/14/21 at 945am  mdINR PT/INR self testing service Test date/time 12/12/21@ 942 am  INR 2.3

## 2021-12-14 NOTE — Telephone Encounter (Signed)
Description   INR  2.3 (Goal 2-3) Continue 5 mg daily Recheck in 1 to 2 weeks     

## 2021-12-20 NOTE — Telephone Encounter (Signed)
Old INR result

## 2021-12-24 ENCOUNTER — Telehealth: Payer: Self-pay | Admitting: *Deleted

## 2021-12-24 NOTE — Telephone Encounter (Signed)
Description   INR  2.3 (Goal 2-3) Continue 5 mg daily Recheck in 1 to 2 weeks     

## 2021-12-24 NOTE — Telephone Encounter (Signed)
Fax received mdINR PT/INR self testing service Test date/time 12/19/21 913 am INR 2.3

## 2021-12-24 NOTE — Telephone Encounter (Signed)
Patient aware and verbalizes understanding. 

## 2021-12-29 ENCOUNTER — Telehealth: Payer: Self-pay | Admitting: *Deleted

## 2021-12-29 NOTE — Telephone Encounter (Signed)
Fax received mdINR PT/INR self testing service Test date/time 12/26/21 1045 am INR 2.4

## 2021-12-29 NOTE — Telephone Encounter (Signed)
LM to [facility]  

## 2021-12-29 NOTE — Telephone Encounter (Signed)
Attempts to contact pt without return call in over 3 days, will close encounter. 

## 2021-12-31 ENCOUNTER — Other Ambulatory Visit: Payer: Self-pay | Admitting: Family

## 2021-12-31 DIAGNOSIS — J439 Emphysema, unspecified: Secondary | ICD-10-CM

## 2022-01-03 ENCOUNTER — Other Ambulatory Visit: Payer: Self-pay | Admitting: Family

## 2022-01-03 DIAGNOSIS — F339 Major depressive disorder, recurrent, unspecified: Secondary | ICD-10-CM

## 2022-01-03 DIAGNOSIS — G8929 Other chronic pain: Secondary | ICD-10-CM

## 2022-01-03 DIAGNOSIS — M4716 Other spondylosis with myelopathy, lumbar region: Secondary | ICD-10-CM

## 2022-01-04 NOTE — Telephone Encounter (Signed)
No answer, mailbox full

## 2022-01-04 NOTE — Telephone Encounter (Signed)
Appt 7-10 with [facility]

## 2022-01-04 NOTE — Telephone Encounter (Signed)
[facility] NTBS 30 days given 12/09/21

## 2022-01-05 ENCOUNTER — Telehealth: Payer: Self-pay | Admitting: *Deleted

## 2022-01-05 MED ORDER — ATORVASTATIN CALCIUM 80 MG PO TABS
ORAL_TABLET | ORAL | 0 refills | Status: DC
Start: 2022-01-05 — End: 2022-01-24

## 2022-01-05 MED ORDER — DULOXETINE HCL 60 MG PO CPEP: 120.0000 mg | ORAL_CAPSULE | Freq: Every day | ORAL | 0 refills | Status: DC

## 2022-01-05 MED ORDER — VITAMIN B-12 1000 MCG PO TABS: 1000.0000 ug | ORAL_TABLET | Freq: Every day | ORAL | 0 refills | Status: DC

## 2022-01-05 MED ORDER — ESOMEPRAZOLE MAGNESIUM 40 MG PO CPDR: DELAYED_RELEASE_CAPSULE | ORAL | 0 refills | Status: DC

## 2022-01-05 NOTE — Telephone Encounter (Signed)
Patient is at goal (2-3)  Continue current dose 5 mg  Follow up as scheduled

## 2022-01-05 NOTE — Addendum Note (Signed)
Addended by: Antonietta Barcelona D on: 01/05/2022 09:09 AM   Modules accepted: Orders

## 2022-01-05 NOTE — Telephone Encounter (Signed)
Fax received mdINR PT/INR self testing service Test date/time 01/02/22 918 am INR 2.6

## 2022-01-11 NOTE — Telephone Encounter (Signed)
Still unable to reach patient, she is now due for her normal 2 week recheck so encounter will be closed.

## 2022-01-12 ENCOUNTER — Telehealth: Payer: Self-pay | Admitting: *Deleted

## 2022-01-12 NOTE — Telephone Encounter (Signed)
Fax received mdINR PT/INR self testing service Test date/time 01/09/22 10 am INR 2.5

## 2022-01-12 NOTE — Telephone Encounter (Signed)
Attempted to contact patient - NA °

## 2022-01-19 NOTE — Telephone Encounter (Signed)
Patient aware.

## 2022-01-24 ENCOUNTER — Ambulatory Visit (INDEPENDENT_AMBULATORY_CARE_PROVIDER_SITE_OTHER): Payer: Medicare Other | Admitting: Family

## 2022-01-24 ENCOUNTER — Telehealth: Payer: Self-pay | Admitting: *Deleted

## 2022-01-24 ENCOUNTER — Encounter: Payer: Self-pay | Admitting: Family

## 2022-01-24 VITALS — BP 118/81 | HR 83 | Temp 98.0°F | Ht 64.0 in | Wt 173.0 lb

## 2022-01-24 DIAGNOSIS — J441 Chronic obstructive pulmonary disease with (acute) exacerbation: Secondary | ICD-10-CM

## 2022-01-24 DIAGNOSIS — M545 Low back pain, unspecified: Secondary | ICD-10-CM

## 2022-01-24 DIAGNOSIS — G8929 Other chronic pain: Secondary | ICD-10-CM

## 2022-01-24 DIAGNOSIS — I700 Atherosclerosis of aorta: Secondary | ICD-10-CM

## 2022-01-24 DIAGNOSIS — F339 Major depressive disorder, recurrent, unspecified: Secondary | ICD-10-CM

## 2022-01-24 DIAGNOSIS — J439 Emphysema, unspecified: Secondary | ICD-10-CM

## 2022-01-24 DIAGNOSIS — Z515 Encounter for palliative care: Secondary | ICD-10-CM

## 2022-01-24 DIAGNOSIS — E782 Mixed hyperlipidemia: Secondary | ICD-10-CM

## 2022-01-24 DIAGNOSIS — F411 Generalized anxiety disorder: Secondary | ICD-10-CM

## 2022-01-24 DIAGNOSIS — M4716 Other spondylosis with myelopathy, lumbar region: Secondary | ICD-10-CM

## 2022-01-24 MED ORDER — TRELEGY ELLIPTA 100-62.5-25 MCG/ACT IN AEPB: INHALATION_SPRAY | RESPIRATORY_TRACT | 2 refills | Status: DC

## 2022-01-24 MED ORDER — TORSEMIDE 20 MG PO TABS: ORAL_TABLET | ORAL | 0 refills | Status: DC

## 2022-01-24 MED ORDER — ALPRAZOLAM 0.25 MG PO TABS: 0.2500 mg | ORAL_TABLET | Freq: Every evening | ORAL | 2 refills | Status: DC | PRN

## 2022-01-24 MED ORDER — ESOMEPRAZOLE MAGNESIUM 40 MG PO CPDR: DELAYED_RELEASE_CAPSULE | ORAL | 0 refills | Status: DC

## 2022-01-24 MED ORDER — TRAMADOL HCL 50 MG PO TABS: 50.0000 mg | ORAL_TABLET | Freq: Three times a day (TID) | ORAL | 1 refills | Status: DC | PRN

## 2022-01-24 MED ORDER — METOPROLOL SUCCINATE ER 50 MG PO TB24: ORAL_TABLET | ORAL | 1 refills | Status: DC

## 2022-01-24 MED ORDER — ATORVASTATIN CALCIUM 80 MG PO TABS: ORAL_TABLET | ORAL | 0 refills | Status: DC

## 2022-01-24 MED ORDER — DULOXETINE HCL 60 MG PO CPEP
120.0000 mg | ORAL_CAPSULE | Freq: Every day | ORAL | 0 refills | Status: DC
Start: 2022-01-24 — End: 2022-03-01

## 2022-01-24 MED ORDER — DILTIAZEM HCL ER COATED BEADS 180 MG PO CP24: ORAL_CAPSULE | ORAL | 2 refills | Status: DC

## 2022-01-24 NOTE — Telephone Encounter (Signed)
Description   INR   2.7 Goal 2-3) Continue 5 mg daily Recheck in 1 to 2 weeks     

## 2022-01-24 NOTE — Telephone Encounter (Signed)
Patient aware and verbalizes understanding. 

## 2022-01-24 NOTE — Patient Instructions (Signed)

## 2022-01-24 NOTE — Telephone Encounter (Signed)
Fax received mdINR PT/INR self testing service Test date/time 01/23/22 1101 am INR 2.7

## 2022-01-24 NOTE — Progress Notes (Signed)
Subjective:    Patient ID: Melinda Collins, female    DOB: March 29, 1941, 81 y.o.   MRN: [id number]  Chief Complaint  Patient presents with   Medical Management of Chronic Issues   PT presents to the office today for chronic follow up.  We decreased her demadex just as needed. She has  taken 2 times a week. She has gained 9 lb, but states she has been increasing her eating. Denies any fluid.   She is followed by Palliative care. She is followed by Ortho and getting back injections. She is followed by Cardiologists.    She reports her breathing is better and only using her oxygen only at night. She has COPD and quit smoking 07/2020.    She has aortic atherosclerosis and takes Lipitor daily.    Pain in her back is worse. She can not tolerate the oxycodone. She has only been taking tylenol. Only takes xanax as needed.    She has an appointment with Neurosurgeon for back injection. States her pain is 10 out 10 and can not get up and walk.  Hypertension This is a chronic problem. The current episode started more than 1 year ago. The problem has been resolved since onset. The problem is controlled. Associated symptoms include anxiety, malaise/fatigue and shortness of breath. Pertinent negatives include no peripheral edema. Risk factors for coronary artery disease include dyslipidemia and obesity. The current treatment provides moderate improvement. Hypertensive end-organ damage includes CAD/MI. There is no history of heart failure.  Hyperlipidemia This is a chronic problem. The current episode started more than 1 year ago. Exacerbating diseases include obesity. Associated symptoms include shortness of breath. Current antihyperlipidemic treatment includes statins. The current treatment provides moderate improvement of lipids. Risk factors for coronary artery disease include dyslipidemia, hypertension and a sedentary lifestyle.  Anxiety Presents for follow-up visit. Symptoms include depressed  mood, excessive worry, nervous/anxious behavior and shortness of breath. Symptoms occur occasionally. The severity of symptoms is moderate.    Depression        This is a chronic problem.  The current episode started more than 1 year ago.   The onset quality is gradual.   The problem occurs intermittently.  Associated symptoms include fatigue.  Associated symptoms include no helplessness, no hopelessness and not sad.  Past medical history includes anxiety.   Back Pain This is a chronic problem. The current episode started more than 1 year ago. The problem occurs intermittently. The problem has been waxing and waning since onset. The pain is present in the lumbar spine. The quality of the pain is described as aching. The pain is at a severity of 10/10. The pain is moderate. She has tried bed rest and walking for the symptoms. The treatment provided mild relief.      Review of Systems  Constitutional:  Positive for fatigue and malaise/fatigue.  Respiratory:  Positive for shortness of breath.   Musculoskeletal:  Positive for back pain.  Psychiatric/Behavioral:  Positive for depression. The patient is nervous/anxious.   All other systems reviewed and are negative.      Objective:   Physical Exam Vitals reviewed.  Constitutional:      General: She is not in acute distress.    Appearance: She is well-developed. She is obese.  HENT:     Head: Normocephalic and atraumatic.     Right Ear: Tympanic membrane normal.     Left Ear: Tympanic membrane normal.  Eyes:     Pupils: Pupils are equal,  round, and reactive to light.  Neck:     Thyroid: No thyromegaly.  Cardiovascular:     Rate and Rhythm: Normal rate and regular rhythm.     Heart sounds: Normal heart sounds. No murmur heard. Pulmonary:     Effort: Pulmonary effort is normal. No respiratory distress.     Breath sounds: Normal breath sounds. No wheezing.  Abdominal:     General: Bowel sounds are normal. There is no distension.      Palpations: Abdomen is soft.     Tenderness: There is no abdominal tenderness.  Musculoskeletal:        General: No tenderness. Normal range of motion.     Cervical back: Normal range of motion and neck supple.  Skin:    General: Skin is warm and dry.  Neurological:     Mental Status: She is alert and oriented to person, place, and time.     Cranial Nerves: No cranial nerve deficit.     Deep Tendon Reflexes: Reflexes are normal and symmetric.  Psychiatric:        Behavior: Behavior normal.        Thought Content: Thought content normal.        Judgment: Judgment normal.       BP 118/81   Pulse 83   Temp 98 F (36.7 C)   Ht '5\' 4"'  (1.626 m)   Wt 173 lb (78.5 kg)   SpO2 93%   BMI 29.70 kg/m      Assessment & Plan:  Melinda Collins comes in today with chief complaint of Medical Management of Chronic Issues   Diagnosis and orders addressed:  1. Aortic atherosclerosis (HCC) - CMP14+EGFR - CBC with Differential/Platelet  2. COPD exacerbation (HCC) - CMP14+EGFR - CBC with Differential/Platelet - For home use only DME lightweight manual wheelchair with seat cushion  3. Mixed hyperlipidemia - CMP14+EGFR - CBC with Differential/Platelet  4. Chronic midline low back pain without sciatica - traMADol (ULTRAM) 50 MG tablet; Take 1-2 tablets (50-100 mg total) by mouth every 8 (eight) hours as needed.  Dispense: 30 tablet; Refill: 1 - CMP14+EGFR - CBC with Differential/Platelet - DULoxetine (CYMBALTA) 60 MG capsule; Take 2 capsules (120 mg total) by mouth daily. Per daughter, 1 am, 1 pm 06-07-21/MP  Dispense: 60 capsule; Refill: 0 - Ambulatory referral to [facility] - For home use only DME lightweight manual wheelchair with seat cushion  5. Depression, recurrent ([place]) - CMP14+EGFR - CBC with Differential/Platelet - DULoxetine (CYMBALTA) 60 MG capsule; Take 2 capsules (120 mg total) by mouth daily. Per daughter, 1 am, 1 pm 06-07-21/MP  Dispense: 60 capsule; Refill:  0  6. GAD (generalized anxiety disorder) - ALPRAZolam (XANAX) 0.25 MG tablet; Take 1 tablet (0.25 mg total) by mouth at bedtime as needed for anxiety.  Dispense: 30 tablet; Refill: 2 - CMP14+EGFR - CBC with Differential/Platelet  7. Pulmonary emphysema, unspecified emphysema type ([place]) - CMP14+EGFR - CBC with Differential/Platelet - Fluticasone-Umeclidin-Vilant (TRELEGY ELLIPTA) 100-62.5-25 MCG/ACT AEPB; INHALE ONE PUFF INTO THE LUNGS ONCE DAILY  Dispense: 60 each; Refill: 2 - For home use only DME lightweight manual wheelchair with seat cushion  8. Palliative care patient - CMP14+EGFR - CBC with Differential/Platelet  9. Lumbar spondylosis with myelopathy - DULoxetine (CYMBALTA) 60 MG capsule; Take 2 capsules (120 mg total) by mouth daily. Per daughter, 1 am, 1 pm 06-07-21/MP  Dispense: 60 capsule; Refill: 0   Labs pending Health Maintenance reviewed Diet and exercise encouraged  Follow up plan:  3 months    Evelina Dun, FNP

## 2022-01-25 ENCOUNTER — Ambulatory Visit: Payer: Self-pay | Admitting: *Deleted

## 2022-01-25 NOTE — Chronic Care Management (AMB) (Signed)
  Chronic Care Management   Note  01/25/2022 Name: Melinda Collins MRN: [id number] DOB: 01/09/1941   Patient has not recently engaged with the Chronic Care Management RN Care Manager. Removing RN Care Manager from Care Team and closing [place]. If patient is currently engaged with another CCM team member I will forward this encounter to inform them of my case closure. Patient may be eligible for re-engagement with RN Care Manager in the future if necessary and can discuss this with their PCP.  Chong Sicilian, BSN, RN-BC Embedded Chronic Care Manager [facility] Direct Dial: [phone]

## 2022-02-02 ENCOUNTER — Telehealth: Payer: Self-pay | Admitting: *Deleted

## 2022-02-02 ENCOUNTER — Other Ambulatory Visit: Payer: Self-pay | Admitting: Family

## 2022-02-02 ENCOUNTER — Ambulatory Visit: Payer: Self-pay | Admitting: Family Medicine

## 2022-02-02 LAB — POCT INR: INR: 2.3 (ref 2.0–3.0)

## 2022-02-02 NOTE — Telephone Encounter (Signed)
Patient and daughter in law [name] aware

## 2022-02-02 NOTE — Telephone Encounter (Signed)
Fax received  mdINR PT/INR self testing service Test date/time 01/30/22 '@959'$  am INR 2.3

## 2022-02-02 NOTE — Progress Notes (Signed)
INR 2.3 (Goal 2-3) Continue 5 mg daily Recheck in 1 to 2 weeks

## 2022-02-03 ENCOUNTER — Telehealth: Payer: Self-pay | Admitting: Family

## 2022-02-03 NOTE — Telephone Encounter (Signed)
Ok

## 2022-02-04 ENCOUNTER — Ambulatory Visit (INDEPENDENT_AMBULATORY_CARE_PROVIDER_SITE_OTHER): Payer: Medicare Other

## 2022-02-04 DIAGNOSIS — J9611 Chronic respiratory failure with hypoxia: Secondary | ICD-10-CM

## 2022-02-04 DIAGNOSIS — I272 Pulmonary hypertension, unspecified: Secondary | ICD-10-CM

## 2022-02-04 DIAGNOSIS — J439 Emphysema, unspecified: Secondary | ICD-10-CM

## 2022-02-04 DIAGNOSIS — E782 Mixed hyperlipidemia: Secondary | ICD-10-CM

## 2022-02-04 DIAGNOSIS — G8929 Other chronic pain: Secondary | ICD-10-CM | POA: Diagnosis not present

## 2022-02-04 DIAGNOSIS — I4821 Permanent atrial fibrillation: Secondary | ICD-10-CM

## 2022-02-04 DIAGNOSIS — M4716 Other spondylosis with myelopathy, lumbar region: Secondary | ICD-10-CM

## 2022-02-04 DIAGNOSIS — F411 Generalized anxiety disorder: Secondary | ICD-10-CM

## 2022-02-04 DIAGNOSIS — I509 Heart failure, unspecified: Secondary | ICD-10-CM

## 2022-02-04 DIAGNOSIS — I110 Hypertensive heart disease with heart failure: Secondary | ICD-10-CM | POA: Diagnosis not present

## 2022-02-04 DIAGNOSIS — E559 Vitamin D deficiency, unspecified: Secondary | ICD-10-CM

## 2022-02-04 DIAGNOSIS — I700 Atherosclerosis of aorta: Secondary | ICD-10-CM

## 2022-02-04 DIAGNOSIS — F339 Major depressive disorder, recurrent, unspecified: Secondary | ICD-10-CM

## 2022-02-10 ENCOUNTER — Telehealth: Payer: Self-pay | Admitting: *Deleted

## 2022-02-10 NOTE — Telephone Encounter (Signed)
Fax received mdINR PT/INR self testing service Test date/time 02/06/22 840 am INR 2.7

## 2022-02-10 NOTE — Telephone Encounter (Signed)
Description   INR   2.7 Goal 2-3) Continue 5 mg daily Recheck in 1 to 2 weeks     

## 2022-02-10 NOTE — Telephone Encounter (Signed)
No answer, mailbox full

## 2022-02-17 ENCOUNTER — Telehealth: Payer: Self-pay | Admitting: *Deleted

## 2022-02-17 NOTE — Telephone Encounter (Signed)
Fax received mdINR PT/INR self testing service Test date/time 02/13/22 932 am INR 2.4

## 2022-02-17 NOTE — Telephone Encounter (Signed)
Description   INR   2.4 Goal 2-3) Continue 5 mg daily Recheck in 1 to 2 weeks     

## 2022-02-17 NOTE — Telephone Encounter (Signed)
Sent through my chart per care giver

## 2022-02-22 ENCOUNTER — Telehealth: Payer: Self-pay | Admitting: *Deleted

## 2022-02-22 NOTE — Telephone Encounter (Signed)
Description   INR 2.1 (Goal 2-3) Continue 5 mg daily Recheck in 1 to 2 weeks

## 2022-02-22 NOTE — Telephone Encounter (Signed)
Sent to [employer] per care giver

## 2022-02-22 NOTE — Telephone Encounter (Signed)
Fax received mdINR PT/INR self testing service Test date/time 02/20/22 825 am INR 2.1  MyChart messages can be sent in this encounter as well if pt prefers this method of communication.

## 2022-03-01 ENCOUNTER — Other Ambulatory Visit: Payer: Self-pay | Admitting: Family

## 2022-03-01 ENCOUNTER — Telehealth: Payer: Self-pay | Admitting: *Deleted

## 2022-03-01 DIAGNOSIS — G8929 Other chronic pain: Secondary | ICD-10-CM

## 2022-03-01 DIAGNOSIS — M4716 Other spondylosis with myelopathy, lumbar region: Secondary | ICD-10-CM

## 2022-03-01 DIAGNOSIS — F339 Major depressive disorder, recurrent, unspecified: Secondary | ICD-10-CM

## 2022-03-01 NOTE — Telephone Encounter (Signed)
Description   INR 2.1 (Goal 2-3) Continue 5 mg daily Recheck in 1 to 2 weeks

## 2022-03-01 NOTE — Telephone Encounter (Signed)
Sent to [employer] per caregiver

## 2022-03-01 NOTE — Telephone Encounter (Signed)
Fax received mdINR PT/INR self testing service Test date/time 02/27/22 947 am INR 2.1

## 2022-03-08 ENCOUNTER — Telehealth: Payer: Self-pay | Admitting: *Deleted

## 2022-03-08 NOTE — Telephone Encounter (Signed)
Description   INR   2.4 Goal 2-3) Continue 5 mg daily Recheck in 1 to 2 weeks     

## 2022-03-08 NOTE — Telephone Encounter (Signed)
Fax received mdINR PT/INR self testing service Test date/time 03/06/22 919 am INR 2.4

## 2022-03-08 NOTE — Telephone Encounter (Signed)
Left message to return call, unable to leave detailed message.

## 2022-03-14 ENCOUNTER — Telehealth: Payer: Self-pay | Admitting: *Deleted

## 2022-03-14 NOTE — Telephone Encounter (Signed)
Description   INR   2.7 Goal 2-3) Continue 5 mg daily Recheck in 1 to 2 weeks     

## 2022-03-14 NOTE — Telephone Encounter (Signed)
Fax received mdINR PT/INR self testing service Test date/time 03/13/22 1051 am INR 2.7

## 2022-03-14 NOTE — Telephone Encounter (Signed)
Sent to [employer] per cargiver

## 2022-03-15 ENCOUNTER — Other Ambulatory Visit: Payer: Self-pay | Admitting: Family

## 2022-03-22 ENCOUNTER — Encounter: Payer: Self-pay | Admitting: Physician Assistant

## 2022-03-22 NOTE — Progress Notes (Addendum)
Cardiology Office Note    Date:  03/25/2022   ID:  [name], DOB October 28, 1940, MRN [id number]  PCP:  [name], FNP  Cardiologist:  Rozann Lesches, MD  Electrophysiologist:  None   Chief Complaint: SOB  History of Present Illness:   Melinda Collins is a 81 y.o. female with history of permanent atrial fibrillation, aortic stenosis, mitral regurgitation, COPD, chronic respiratory failure, moderate pulm HTN by echo 2022, CKD stage 3a by labs, uterine CA, arthritis, depression who is seen for follow-up. Her afib has been treated with a strategy of rate control and anticoagulation, with PCP following INR levels on Coumadin. Last echocardiogram 04/2021 showed EF 55-60%, mild LVH, moderately elevated PASP, severe BAE, moderate MR (degenerative MV), mild-moderate TR, mild AI, moderate AS.  She is accompanied today by her daughter in law. Over the last several months she's had worsening SOB. She gets SOB with even minimal movement. Daughter in law reports in the past they had to scale back the torsemide due to kidney problems so she is only taking 2-4 tablets per week, I.e. 2 days per week. They usually give it when she has leg swelling. She has not been on any recent potassium. She also sounds to have a chronic deep productive cough. She complains of occasional heartburn at night but no exertional dyspnea. She initially refused blood pressure cuff on the arm and labwork but then was agreeable. We discussed the abnormal CXR she had last year that recommended CT if symptoms persisted. It sounds like she may or may not be agreeable. She also suffers from chronic back pain and anxiety. We discussed following up with PCP as well. During the visit she shifted herself in the wheelchair and even just the simple movement made her very SOB and anxious. It took a minute or so for her breathing to calm down. Her daughter in law says this happens occasionally.   Labwork independently  reviewed: 09/2021 K 4.4, Cr 1.35 08/2021 Hgb 12.9, plt ok, AST, ALT OK 2022 Mg 2.0, A1C 5.1 2020 TSH wnl Lipids historically followed in primary care  Cardiology Studies:   Studies reviewed are outlined and summarized above. Reports included below if pertinent.   2d echo 04/2021  IMPRESSIONS     1. Left ventricular ejection fraction, by estimation, is 55 to 60%. The  left ventricle has normal function. The left ventricle has no regional  wall motion abnormalities. There is mild left ventricular hypertrophy.  Left ventricular diastolic parameters  are indeterminate.   2. Right ventricular systolic function is normal. The right ventricular  size is normal. There is moderately elevated pulmonary artery systolic  pressure. The estimated right ventricular systolic pressure is 81.8 mmHg.   3. Left atrial size was severely dilated.   4. Right atrial size was severely dilated.   5. The mitral valve is degenerative. Moderate mitral annular  calcification.      Moderate mitral valve regurgitation. Eccentric posterior directed jet,  may be underestimating due to eccentric jet.   6. Tricuspid valve regurgitation is mild to moderate.   7. The aortic valve is tricuspid. There is moderate calcification of the  aortic valve. Aortic valve regurgitation is mild. Moderate aortic valve  stenosis. Vmax 2.9 m/s, MG 67mHg, AVA 1.2 cm^2, DI 0.46   8. The inferior vena cava is dilated in size with <50% respiratory  variability, suggesting right atrial pressure of 15 mmHg.   9. Evidence of atrial level shunting detected by color flow  Doppler.   Monitor 2017 - see scan    Past Medical History:  Diagnosis Date   Aortic insufficiency    Aortic stenosis    Arthritis    Chronic hypoxemic respiratory failure (HCC)    Chronic kidney disease, stage 3a (HCC)    COPD (chronic obstructive pulmonary disease) (HCC)    Depression    HOH (hard of hearing)    Mitral regurgitation    Mixed hyperlipidemia     Permanent atrial fibrillation (HCC)    Pulmonary hypertension (HCC)    Ruptured disk    Tricuspid regurgitation    Uterine cancer [facility])     Past Surgical History:  Procedure Laterality Date   ABDOMINAL HYSTERECTOMY     total   CATARACT EXTRACTION W/PHACO Left 08/25/2014   Procedure: CATARACT EXTRACTION PHACO AND INTRAOCULAR LENS PLACEMENT (IOC);  Surgeon: Tonny Branch, MD;  Location: [facility];  Service: Ophthalmology;  Laterality: Left;  CDE 6.95   CATARACT EXTRACTION W/PHACO Right 09/11/2014   Procedure: CATARACT EXTRACTION PHACO AND INTRAOCULAR LENS PLACEMENT (IOC);  Surgeon: Tonny Branch, MD;  Location: [facility];  Service: Ophthalmology;  Laterality: Right;  CDE:8.11   CERVICAL DISC SURGERY     x2   KYPHOPLASTY     SPINE SURGERY  2013   cervical fusion    Current Medications: Current Meds  Medication Sig   acetaminophen (TYLENOL) 325 MG tablet Take 650 mg by mouth every 6 (six) hours as needed for moderate pain.   ALPRAZolam (XANAX) 0.25 MG tablet Take 1 tablet (0.25 mg total) by mouth at bedtime as needed for anxiety.   atorvastatin (LIPITOR) 80 MG tablet TAKE ONE TABLET BY MOUTH DAILY AT 6 PM   bismuth subsalicylate (PEPTO BISMOL) 262 MG/15ML suspension Take 30 mLs by mouth every 6 (six) hours as needed.   diltiazem (CARDIZEM CD) 180 MG 24 hr capsule TAKE ONE CAPSULE BY MOUTH DAILY (,,EVENING)   DULoxetine (CYMBALTA) 60 MG capsule TAKE TWO (2) CAPSULES BY MOUTH DAILY. ((PER DAUGHTER, ,1 ,AM AND ,1 ,PM))   esomeprazole (NEXIUM) 40 MG capsule TAKE ONE CAPSULE BY MOUTH ONCE DAILY (MORNING PER PT.)   fluticasone (FLONASE) 50 MCG/ACT nasal spray USE ONE SPRAY IN EACH NOSTRIL ONCE DAILY. SHAKE GENTLY BEFORE USING.   guaiFENesin (MUCINEX) 600 MG 12 hr tablet Take 1 tablet (600 mg total) by mouth 2 (two) times daily as needed.   ketoconazole (NIZORAL) 2 % cream Apply topically daily.   levalbuterol (XOPENEX) 0.63 MG/3ML nebulizer solution INHALE CONTENTS OF 1 VIAL IN NEBULIZER EVERY 6 HOURS AS  NEEDED FOR WHEEZING OR SHORTNESS OF BREATH   loratadine (ALLERGY RELIEF) 10 MG tablet TAKE ONE TABLET BY MOUTH DAILY (,EVENING)   Magnesium Hydroxide (DULCOLAX PO) Take 25 mg by mouth daily.   metoprolol succinate (TOPROL-XL) 50 MG 24 hr tablet TAKE ONE TABLET BY MOUTH IN THE EVENING. TAKE WITH OR IMMEDIATELY FOLLOWING A MEAL. (,EVENING)   NON FORMULARY Inhale 2-4 L into the lungs daily. oxygen   nystatin (MYCOSTATIN/NYSTOP) powder Apply 1 application topically 3 (three) times daily.   nystatin cream (MYCOSTATIN) Apply 1 application topically 2 (two) times daily.   Spacer/Aero-Holding Chambers DEVI 1 Device by Does not apply route in the morning and at bedtime.   traMADol (ULTRAM) 50 MG tablet Take 1-2 tablets (50-100 mg total) by mouth every 8 (eight) hours as needed.   TRELEGY ELLIPTA 100-62.5-25 MCG/ACT AEPB INHALE ONE PUFF INTO THE LUNGS ONCE DAILY   vitamin B-12 (CYANOCOBALAMIN) 1000 MCG tablet TAKE  ONE TABLET BY MOUTH DAILY WITH BREAKFAST   Vitamin D, Ergocalciferol, (DRISDOL) 1.25 MG (50000 UNIT) CAPS capsule TAKE ONE CAPSULE BY MOUTH EVERY 7 DAYS. (SUNDAY MORNING) (Patient taking differently: Take 50,000 Units by mouth every 7 (seven) days. Mondays)   warfarin (COUMADIN) 5 MG tablet TAKE 1 OR 2 TABLETS BY MOUTH AS DIRECTED BY WARFARIN CLINIC (,1-SU,M,W,TH,F,SA/,1&1/2-TU)   [DISCONTINUED] torsemide (DEMADEX) 20 MG tablet TAKE 1 AND 1/2 TABLETS TO 2 TABLETS BY MOUTH EVERY MORNING, (NEEDS TO BE SEEN BEFORE NEXT REFILL) (Patient taking differently: Take 20 mg by mouth as needed. TAKE 1 AND 1/2 TABLETS TO 2 TABLETS BY MOUTH EVERY MORNING, (NEEDS TO BE SEEN BEFORE NEXT REFILL))      Allergies:   Baclofen, Niaspan [niacin], and Flexeril [cyclobenzaprine]   Social History   Socioeconomic History   Marital status: Married    Spouse name: Not on file   Number of children: 1   Years of education: Not on file   Highest education level: Not on file  Occupational History   Occupation: retired   Tobacco Use   Smoking status: Former    Packs/day: 1.00    Types: Cigarettes    Start date: 07/18/1960   Smokeless tobacco: Never   Tobacco comments:    Qut last week.  08/10/16 restarted smoking cigarettes  Vaping Use   Vaping Use: Never used  Substance and Sexual Activity   Alcohol use: No    Alcohol/week: 0.0 standard drinks of alcohol   Drug use: No   Sexual activity: Not on file  Other Topics Concern   Not on file  Social History Narrative   Single; disabled.    Son lives near and daughter in law is Therapist, sports - they help her daily   Social Determinants of Health   Financial Resource Strain: Low Risk  (04/08/2021)   Overall Financial Resource Strain (CARDIA)    Difficulty of Paying Living Expenses: Not very hard  Food Insecurity: No Food Insecurity (04/08/2021)   Hunger Vital Sign    Worried About Running Out of Food in the Last Year: Never true    Ran Out of Food in the Last Year: Never true  Transportation Needs: No Transportation Needs (04/08/2021)   PRAPARE - Hydrologist (Medical): No    Lack of Transportation (Non-Medical): No  Physical Activity: Inactive (04/08/2021)   Exercise Vital Sign    Days of Exercise per Week: 0 days    Minutes of Exercise per Session: 0 min  Stress: Stress Concern Present (04/08/2021)   [place]    Feeling of Stress : Rather much  Social Connections: Socially Isolated (04/08/2021)   Social Connection and Isolation Panel [NHANES]    Frequency of Communication with Friends and Family: Three times a week    Frequency of Social Gatherings with Friends and Family: Three times a week    Attends Religious Services: Never    Active Member of Clubs or Organizations: No    Attends Music therapist: Never    Marital Status: Never married     Family History:  The patient's family history includes COPD in her brother; Cancer in her brother,  brother, and father; Coronary artery disease in an other family member; Diabetes in her mother; Heart attack in her father; Heart attack (age of onset: 51) in her brother; Heart disease in her brother and mother; Kidney disease in her father; Stroke in her mother.  ROS:   Please see the history of present illness.  All other systems are reviewed and otherwise negative.    EKG(s)/Additional Labs   EKG:  EKG is ordered today, personally reviewed, demonstrating atrial fibrillation 65bpm, IRBBB QRS 141m, prior inferior infarct pattern nonspecific STTW changes. Similar to prior except rate improved.  Recent Labs: 05/01/2021: B Natriuretic Peptide 527.0 06/12/2021: Magnesium 2.0 08/25/2021: ALT 18; Hemoglobin 12.9; Platelets 218 10/15/2021: BUN 21; Creatinine, Ser 1.35; Potassium 4.4; Sodium 145  Recent Lipid Panel    Component Value Date/Time   CHOL 154 06/18/2019 1558   CHOL 121 08/06/2013 1123   TRIG 191 (H) 06/18/2019 1558   TRIG 302 (H) 02/16/2016 1247   TRIG 136 08/06/2013 1123   HDL 59 06/18/2019 1558   HDL 51 02/16/2016 1247   HDL 48 08/06/2013 1123   CHOLHDL 2.6 06/18/2019 1558   LDLCALC 64 06/18/2019 1558   LDLCALC 46 08/06/2013 1123    PHYSICAL EXAM:    VS:  BP 118/64   Pulse 63   Ht '5\' 4"'$  (1.626 m)   Wt 174 lb 9.6 oz (79.2 kg)   SpO2 99%   BMI 29.97 kg/m   BMI: Body mass index is 29.97 kg/m.  GEN: Elderly WFfemale in no acute distress HEENT: normocephalic, atraumatic Neck: no JVD, carotid bruits, or masses Cardiac: distant heart sounds, irregularly irregular, no murmurs, rubs, or gallops, no edema  Respiratory:  diffusely diminished bilaterally, normal work of breathing but has increased WOB with minimal movement GI: soft, nontender, nondistended, + BS MS: no deformity or atrophy Skin: warm and dry, no rash, venous discoloration of R foot but extremity is warm Neuro:  Alert and Oriented x 3, Strength and sensation are intact, follows commands Psych: euthymic  mood, full affect  Wt Readings from Last 3 Encounters:  03/25/22 174 lb 9.6 oz (79.2 kg)  01/24/22 173 lb (78.5 kg)  10/15/21 167 lb 3.2 oz (75.8 kg)     ASSESSMENT & PLAN:   1. Dyspnea - suspect multifactorial due to severe COPD, also likely component of deconditioning, valvular disease, and pulmonary HTN. She appeared intermittently SOB here in the office with minimal movement but she declined to go to the hospital. It sounds like she is not thrilled about specific interventions though wants to find out why she is so short of breath. Her CXR in 2022 showed abnormalities raising question of needing additional CT imaging if symptoms persisted. I have recommended noncontrasted CT of the chest but will leave it up to her whether she would like to pursue this. I have also recommended CBC, BMET, BNP and update 2D echocardiogram. Given stable O2 sat on home O2, no tachycardia, and chronic anticoagulation, VTE felt less likely. Recommended referral to pulmonology and they declined, stating PCP follows COPD. Close primary care f/u encouraged.  2. Permanent atrial fibrillation - rate controlled on diltiazem and metoprolol. She is anticoagulated with warfarin. I inquired why warfarin over DOAC. Daughter in law states at one point there was question of whether she'd had a valve replaced but they later realized that wasn't the case, but have stuck with Coumadin since that time. Will check BMET/CBC today and reach out to Dr. MDomenic Politeto inquire if he is OK with a switch to Eliquis.  3. Moderate aortic stenosis, mild aortic insufficiency, moderate mitral regurgitation, mild-moderate tricuspid regurgitation - heart sounds challenging to hear. Will repeat echocardiogram given worsening dyspnea.  4. Pulmonary hypertension - suspect contributing to dyspnea, likely made worse by chronic  hypoxic respiratory failure and COPD. Will recheck echocardiogram along with labs above. Daughter in law is dosing torsemide PRN at  this juncture, will await labs to help guide next steps.    Disposition: F/u with Dr. Domenic Polite or APP in 1 month.   Medication Adjustments/Labs and Tests Ordered: Current medicines are reviewed at length with the patient today.  Concerns regarding medicines are outlined above. Medication changes, Labs and Tests ordered today are summarized above and listed in the Patient Instructions accessible in Encounters.    Signed, [name], PA-C  03/25/2022 1:47 PM    [facility] [place]. [place], [place] [address] Ph: [phone]; Fax [phone]

## 2022-03-23 ENCOUNTER — Telehealth: Payer: Self-pay | Admitting: Family Medicine

## 2022-03-23 NOTE — Telephone Encounter (Signed)
Fax received mdINR PT/INR self testing service Test date/time 03/20/22 10:14am INR 2.5

## 2022-03-23 NOTE — Telephone Encounter (Signed)
Continue coumadin as is °

## 2022-03-23 NOTE — Telephone Encounter (Signed)
[name] aware

## 2022-03-24 ENCOUNTER — Other Ambulatory Visit: Payer: Self-pay | Admitting: Family

## 2022-03-24 DIAGNOSIS — J439 Emphysema, unspecified: Secondary | ICD-10-CM

## 2022-03-25 ENCOUNTER — Ambulatory Visit: Payer: Medicare Other | Attending: Physician Assistant | Admitting: Physician Assistant

## 2022-03-25 ENCOUNTER — Encounter: Payer: Self-pay | Admitting: Physician Assistant

## 2022-03-25 ENCOUNTER — Other Ambulatory Visit (HOSPITAL_COMMUNITY)
Admission: RE | Admit: 2022-03-25 | Discharge: 2022-03-25 | Disposition: A | Discharge status: Home / Self Care | Payer: Medicare Other | Source: Ambulatory Visit | Attending: Physician Assistant | Admitting: Physician Assistant

## 2022-03-25 ENCOUNTER — Telehealth: Payer: Self-pay

## 2022-03-25 VITALS — BP 118/64 | HR 63 | Ht 64.0 in | Wt 174.6 lb

## 2022-03-25 DIAGNOSIS — R0602 Shortness of breath: Secondary | ICD-10-CM

## 2022-03-25 DIAGNOSIS — I350 Nonrheumatic aortic (valve) stenosis: Secondary | ICD-10-CM | POA: Diagnosis not present

## 2022-03-25 DIAGNOSIS — I272 Pulmonary hypertension, unspecified: Secondary | ICD-10-CM

## 2022-03-25 DIAGNOSIS — E785 Hyperlipidemia, unspecified: Secondary | ICD-10-CM

## 2022-03-25 DIAGNOSIS — I4821 Permanent atrial fibrillation: Secondary | ICD-10-CM

## 2022-03-25 DIAGNOSIS — I340 Nonrheumatic mitral (valve) insufficiency: Secondary | ICD-10-CM

## 2022-03-25 DIAGNOSIS — I071 Rheumatic tricuspid insufficiency: Secondary | ICD-10-CM

## 2022-03-25 DIAGNOSIS — I351 Nonrheumatic aortic (valve) insufficiency: Secondary | ICD-10-CM

## 2022-03-25 LAB — BASIC METABOLIC PANEL
Anion gap: 8 (ref 5–15)
BUN: 37 mg/dL — ABNORMAL HIGH (ref 8–23)
CO2: 21 mmol/L — ABNORMAL LOW (ref 22–32)
Calcium: 8.9 mg/dL (ref 8.9–10.3)
Chloride: 109 mmol/L (ref 98–111)
Creatinine, Ser: 2.06 mg/dL — ABNORMAL HIGH (ref 0.44–1.00)
GFR, Estimated: 24 mL/min — ABNORMAL LOW (ref >60)
Glucose, Bld: 123 mg/dL — ABNORMAL HIGH (ref 70–99)
Potassium: 5.4 mmol/L — ABNORMAL HIGH (ref 3.5–5.1)
Sodium: 138 mmol/L (ref 135–145)

## 2022-03-25 LAB — CBC
HCT: 42.3 % (ref 36.0–46.0)
Hemoglobin: 13.2 g/dL (ref 12.0–15.0)
MCH: 30 pg (ref 26.0–34.0)
MCHC: 31.2 g/dL (ref 30.0–36.0)
MCV: 96.1 fL (ref 80.0–100.0)
Platelets: 215 10*3/uL (ref 150–400)
RBC: 4.4 MIL/uL (ref 3.87–5.11)
RDW: 15.8 % — ABNORMAL HIGH (ref 11.5–15.5)
WBC: 7.2 10*3/uL (ref 4.0–10.5)
nRBC: 0 % (ref 0.0–0.2)

## 2022-03-25 LAB — BRAIN NATRIURETIC PEPTIDE: B Natriuretic Peptide: 166 pg/mL — ABNORMAL HIGH (ref 0.0–100.0)

## 2022-03-25 MED ORDER — TORSEMIDE 20 MG PO TABS: 20.0000 mg | ORAL_TABLET | ORAL | 0 refills | Status: DC

## 2022-03-25 NOTE — Telephone Encounter (Signed)
Pt's daughter in law notified and verbalized understanding. Pt's daughter in law had no questions at this time.

## 2022-03-25 NOTE — Patient Instructions (Signed)
Medication Instructions:  Your physician recommends that you continue on your current medications as directed. Please refer to the Current Medication list given to you today.   Labwork: BMET BNP CBC  Testing/Procedures: Your physician has requested that you have an echocardiogram. Echocardiography is a painless test that uses sound waves to create images of your heart. It provides your doctor with information about the size and shape of your heart and how well your heart's chambers and valves are working. This procedure takes approximately one hour. There are no restrictions for this procedure.  Non-Contrasted Chest CT  Follow-Up: Follow up in 1 month with APP or Dr. Domenic Polite.   Any Other Special Instructions Will Be Listed Below (If Applicable).     If you need a refill on your cardiac medications before your next appointment, please call your pharmacy.

## 2022-03-25 NOTE — Telephone Encounter (Signed)
-----   Message from [name], [name] sent at 03/25/2022  3:54 PM EDT ----- Patient seen in the office today, had visible SOB with mild movement. Initially declined to go to the hospital but labwork would suggest she needs to go. Her kidney function and potassium are more abnormal. I do not have any specific medicines we can stop to help this - was only taking torsemide 2x/week anyway and was recently having trouble with worsening SOB/fluid per daughter in law. Needs expedited workup. Recommend going to the ER for worsening kidney function and SOB.

## 2022-03-25 NOTE — Addendum Note (Signed)
Addended by: Levonne Hubert on: 03/25/2022 03:29 PM   Modules accepted: Orders

## 2022-03-29 ENCOUNTER — Ambulatory Visit: Payer: Medicare Other | Attending: Physician Assistant

## 2022-03-29 ENCOUNTER — Telehealth: Payer: Self-pay | Admitting: *Deleted

## 2022-03-29 ENCOUNTER — Other Ambulatory Visit: Payer: Self-pay | Admitting: Family

## 2022-03-29 DIAGNOSIS — R0602 Shortness of breath: Secondary | ICD-10-CM

## 2022-03-29 NOTE — Telephone Encounter (Signed)
Fax received mdINR PT/INR self testing service Test date/time 03/27/22 957 am INR 2.4

## 2022-03-29 NOTE — Telephone Encounter (Signed)
Description   INR   2.4 Goal 2-3) Continue 5 mg daily Recheck in 1 to 2 weeks     

## 2022-03-29 NOTE — Telephone Encounter (Signed)
Lm making pt aware

## 2022-03-30 LAB — ECHOCARDIOGRAM COMPLETE
AR max vel: 0.76 cm2
AV Area VTI: 0.92 cm2
AV Area mean vel: 0.98 cm2
AV Mean grad: 23 mmHg
AV Peak grad: 73.3 mmHg
AV Vena cont: 0.35 cm
Ao pk vel: 4.28 m/s
Area-P 1/2: 3.76 cm2
Calc EF: 55.3 %
MV M vel: 4.94 m/s
MV Peak grad: 97.6 mmHg
P 1/2 time: 561 msec
Radius: 0.4 cm
S' Lateral: 2.75 cm
Single Plane A2C EF: 50.8 %
Single Plane A4C EF: 55.6 %

## 2022-04-01 ENCOUNTER — Telehealth: Payer: Self-pay

## 2022-04-01 DIAGNOSIS — N289 Disorder of kidney and ureter, unspecified: Secondary | ICD-10-CM

## 2022-04-01 NOTE — Telephone Encounter (Signed)
-----   Message from [name], [name] sent at 04/01/2022  9:16 AM EDT ----- OK to refer to nephrology. Would also have close f/u with PCP in the next few days as well if she had not done htat

## 2022-04-01 NOTE — Telephone Encounter (Signed)
Referral sent to Dr. Toya Smothers office in [place].

## 2022-04-04 ENCOUNTER — Telehealth: Payer: Self-pay | Admitting: *Deleted

## 2022-04-04 NOTE — Telephone Encounter (Signed)
Fax received mdINR PT/INR self testing service Test date/time 04/03/22 1112 am INR 2.8

## 2022-04-04 NOTE — Telephone Encounter (Signed)
Description   INR  2.8 (Goal 2-3) Continue 5 mg daily Recheck in 1 to 2 weeks

## 2022-04-07 ENCOUNTER — Other Ambulatory Visit: Payer: Self-pay | Admitting: Family

## 2022-04-11 ENCOUNTER — Other Ambulatory Visit: Payer: Self-pay | Admitting: Family

## 2022-04-11 ENCOUNTER — Ambulatory Visit (INDEPENDENT_AMBULATORY_CARE_PROVIDER_SITE_OTHER): Payer: Medicare Other

## 2022-04-11 DIAGNOSIS — Z0000 Encounter for general adult medical examination without abnormal findings: Secondary | ICD-10-CM

## 2022-04-11 DIAGNOSIS — J441 Chronic obstructive pulmonary disease with (acute) exacerbation: Secondary | ICD-10-CM

## 2022-04-11 NOTE — Progress Notes (Signed)
MEDICARE ANNUAL WELLNESS VISIT  04/11/2022  Telephone Visit Disclaimer This Medicare AWV was conducted by telephone due to national recommendations for restrictions regarding the COVID-19 Pandemic (e.g. social distancing).  I verified, using two identifiers, that I am speaking with [place] or their authorized healthcare agent. I discussed the limitations, risks, security, and privacy concerns of performing an evaluation and management service by telephone and the potential availability of an in-person appointment in the future. The patient expressed understanding and agreed to proceed.  Location of Patient: Home Location of Provider (nurse):  [facility]  Subjective:    Melinda Collins is a 81 y.o. female patient of Hawks, Theador Hawthorne, FNP who had a [employer] Visit today via telephone. Melinda is Retired and lives with their family. she has one children. she reports that she is socially active and does interact with friends/family regularly. she is minimally physically active and enjoys reading and watching television.  Patient Care Team: [name], FNP as PCP - General (Family Medicine) Satira Sark, MD as PCP - Cardiology (Cardiology)     04/11/2022    2:44 PM 06/10/2021   12:26 AM 04/25/2021    3:14 PM 04/08/2021    3:50 PM 09/15/2020    6:14 PM 08/25/2014   10:50 AM 08/20/2014    2:16 PM  Advanced Directives  Does Patient Have a Medical Advance Directive? No No No No No Yes No  Copy of Healthcare Power of Attorney in Chart?      Yes   Would patient like information on creating a medical advance directive? No - Patient declined No - Patient declined No - Patient declined No - Patient declined   No - patient declined information    Hospital Utilization Over the Past 12 Months: # of hospitalizations or ER visits: 2 # of surgeries: 0  Review of Systems    Patient reports that her overall health is unchanged compared to last year.  History  obtained from chart review and the patient  Patient Reported Readings (BP, Pulse, CBG, Weight, etc) none  Pain Assessment Pain : 0-10 Pain Score: 10-Worst pain ever Pain Location: Back Pain Orientation: Lower Pain Descriptors / Indicators: Constant, Aching Pain Onset: More than a month ago Pain Frequency: Constant Pain Relieving Factors: Tylenol  Pain Relieving Factors: Tylenol  Current Medications & Allergies (verified) Allergies as of 04/11/2022       Reactions   Baclofen    Feels crazy   Niaspan [niacin]    Flexeril [cyclobenzaprine]    Feels crazy        Medication List        Accurate as of April 11, 2022  2:50 PM. If you have any questions, ask your nurse or doctor.          acetaminophen 325 MG tablet Commonly known as: TYLENOL Take 650 mg by mouth every 6 (six) hours as needed for moderate pain.   albuterol 108 (90 Base) MCG/ACT inhaler Commonly known as: VENTOLIN HFA SMARTSIG:1-2 Puff(s) Via Inhaler Every 4-6 Hours PRN   ALPRAZolam 0.25 MG tablet Commonly known as: Xanax Take 1 tablet (0.25 mg total) by mouth at bedtime as needed for anxiety.   atorvastatin 80 MG tablet Commonly known as: LIPITOR TAKE ONE TABLET BY MOUTH DAILY AT 6 PM   bismuth subsalicylate 354 TG/25WL suspension Commonly known as: PEPTO BISMOL Take 30 mLs by mouth every 6 (six) hours as needed.   cyanocobalamin 1000 MCG tablet  Commonly known as: VITAMIN B12 TAKE ONE TABLET BY MOUTH DAILY WITH BREAKFAST   diclofenac sodium 1 % Gel Commonly known as: VOLTAREN Apply 2 g topically 4 (four) times daily.   diltiazem 180 MG 24 hr capsule Commonly known as: CARDIZEM CD TAKE ONE CAPSULE BY MOUTH DAILY (,,EVENING)   DULCOLAX PO Take 25 mg by mouth daily.   DULoxetine 60 MG capsule Commonly known as: CYMBALTA TAKE TWO (2) CAPSULES BY MOUTH DAILY. ((PER DAUGHTER, ,1 ,AM AND ,1 ,PM))   esomeprazole 40 MG capsule Commonly known as: NEXIUM TAKE ONE CAPSULE BY MOUTH ONCE  DAILY (MORNING PER PT.)   fluticasone 50 MCG/ACT nasal spray Commonly known as: FLONASE USE ONE SPRAY IN EACH NOSTRIL ONCE DAILY. SHAKE GENTLY BEFORE USING.   guaiFENesin 600 MG 12 hr tablet Commonly known as: MUCINEX Take 1 tablet (600 mg total) by mouth 2 (two) times daily as needed.   ketoconazole 2 % cream Commonly known as: NIZORAL Apply topically daily.   levalbuterol 0.63 MG/3ML nebulizer solution Commonly known as: XOPENEX INHALE CONTENTS OF 1 VIAL IN NEBULIZER EVERY 6 HOURS AS NEEDED FOR WHEEZING OR SHORTNESS OF BREATH   loratadine 10 MG tablet Commonly known as: Allergy Relief TAKE ONE TABLET BY MOUTH DAILY (,EVENING)   metoprolol succinate 50 MG 24 hr tablet Commonly known as: TOPROL-XL TAKE ONE TABLET BY MOUTH IN THE EVENING. TAKE WITH OR IMMEDIATELY FOLLOWING A MEAL. (,EVENING)   NON FORMULARY Inhale 2-4 L into the lungs daily. oxygen   nystatin powder Commonly known as: MYCOSTATIN/NYSTOP Apply 1 application topically 3 (three) times daily.   nystatin cream Commonly known as: MYCOSTATIN Apply 1 application topically 2 (two) times daily.   Spacer/Aero-Holding Dorise Bullion 1 Device by Does not apply route in the morning and at bedtime.   torsemide 20 MG tablet Commonly known as: DEMADEX Take 1 tablet (20 mg total) by mouth 2 (two) times a week. What changed:  when to take this reasons to take this   traMADol 50 MG tablet Commonly known as: ULTRAM Take 1-2 tablets (50-100 mg total) by mouth every 8 (eight) hours as needed.   Trelegy Ellipta 100-62.5-25 MCG/ACT Aepb Generic drug: Fluticasone-Umeclidin-Vilant INHALE ONE PUFF INTO THE LUNGS ONCE DAILY   Vitamin D (Ergocalciferol) 1.25 MG (50000 UNIT) Caps capsule Commonly known as: DRISDOL TAKE ONE CAPSULE BY MOUTH EVERY 7 DAYS. (SUNDAY MORNING) What changed: See the new instructions.   warfarin 5 MG tablet Commonly known as: COUMADIN Take as directed by the anticoagulation clinic. If you are  unsure how to take this medication, talk to your nurse or doctor. Original instructions: TAKE 1 OR 2 TABLETS BY MOUTH AS DIRECTED BY WARFARIN CLINIC (T1,TABLET DAILY EXCEPT ON TUESDAY TAKE 1 AND 1/2 TABLETS)        History (reviewed): Past Medical History:  Diagnosis Date   Aortic insufficiency    Aortic stenosis    Arthritis    Chronic hypoxemic respiratory failure (HCC)    Chronic kidney disease, stage 3a (HCC)    COPD (chronic obstructive pulmonary disease) (HCC)    Depression    HOH (hard of hearing)    Mitral regurgitation    Mixed hyperlipidemia    Permanent atrial fibrillation (HCC)    Pulmonary hypertension (HCC)    Ruptured disk    Tricuspid regurgitation    Uterine cancer (HCC)    Past Surgical History:  Procedure Laterality Date   ABDOMINAL HYSTERECTOMY     total   CATARACT EXTRACTION W/PHACO Left  08/25/2014   Procedure: CATARACT EXTRACTION PHACO AND INTRAOCULAR LENS PLACEMENT (IOC);  Surgeon: Tonny Branch, MD;  Location: [facility];  Service: Ophthalmology;  Laterality: Left;  CDE 6.95   CATARACT EXTRACTION W/PHACO Right 09/11/2014   Procedure: CATARACT EXTRACTION PHACO AND INTRAOCULAR LENS PLACEMENT (IOC);  Surgeon: Tonny Branch, MD;  Location: [facility];  Service: Ophthalmology;  Laterality: Right;  CDE:8.11   CERVICAL DISC SURGERY     x2   KYPHOPLASTY     SPINE SURGERY  2013   cervical fusion   Family History  Problem Relation Age of Onset   Heart disease Mother    Stroke Mother    Diabetes Mother    Heart attack Father    Kidney disease Father    Cancer Father        prostate   Cancer Brother    Heart attack Brother 52   Cancer Brother        prostate   Heart disease Brother        CHF   COPD Brother    Coronary artery disease Other        fhx   Social History   Socioeconomic History   Marital status: Married    Spouse name: Not on file   Number of children: 1   Years of education: Not on file   Highest education level: Not on file  Occupational  History   Occupation: retired  Tobacco Use   Smoking status: Former    Packs/day: 1.00    Types: Cigarettes    Start date: 07/18/1960   Smokeless tobacco: Never   Tobacco comments:    Qut last week.  08/10/16 restarted smoking cigarettes  Vaping Use   Vaping Use: Never used  Substance and Sexual Activity   Alcohol use: No    Alcohol/week: 0.0 standard drinks of alcohol   Drug use: No   Sexual activity: Not on file  Other Topics Concern   Not on file  Social History Narrative   Single; disabled.    Son lives near and daughter in law is Therapist, sports - they help her daily   Social Determinants of Health   Financial Resource Strain: Low Risk  (04/08/2021)   Overall Financial Resource Strain (CARDIA)    Difficulty of Paying Living Expenses: Not very hard  Food Insecurity: No Food Insecurity (04/08/2021)   Hunger Vital Sign    Worried About Running Out of Food in the Last Year: Never true    Ran Out of Food in the Last Year: Never true  Transportation Needs: No Transportation Needs (04/08/2021)   PRAPARE - Hydrologist (Medical): No    Lack of Transportation (Non-Medical): No  Physical Activity: Inactive (04/08/2021)   Exercise Vital Sign    Days of Exercise per Week: 0 days    Minutes of Exercise per Session: 0 min  Stress: Stress Concern Present (04/08/2021)   [place]    Feeling of Stress : Rather much  Social Connections: Socially Isolated (04/08/2021)   Social Connection and Isolation Panel [NHANES]    Frequency of Communication with Friends and Family: Three times a week    Frequency of Social Gatherings with Friends and Family: Three times a week    Attends Religious Services: Never    Active Member of Clubs or Organizations: No    Attends Archivist Meetings: Never    Marital Status: Never married  Activities of Daily Living    04/11/2022    2:44 PM 06/10/2021   12:26 AM   In your present state of health, do you have any difficulty performing the following activities:  Hearing? 1 0  Vision? 0 0  Difficulty concentrating or making decisions? 1 1  Walking or climbing stairs? 1 1  Dressing or bathing? 1 1  Doing errands, shopping? 1 1  Preparing Food and eating ? Y   Using the Toilet? Y   In the past six months, have you accidently leaked urine? N   Do you have problems with loss of bowel control? N   Managing your Medications? Y   Managing your Finances? Y   Housekeeping or managing your Housekeeping? Y    Patient does report that her hearing has declined but she does wear hearing aids which has helped.  She lives with her daughter and has a full time aide who both help her with activities of daily living.  Patient Education/ Literacy How often do you need to have someone help you when you read instructions, pamphlets, or other written materials from your doctor or pharmacy?: 1 - Never What is the last grade level you completed in school?: 8th grade  Exercise Current Exercise Habits: The patient does not participate in regular exercise at present, Exercise limited by: orthopedic condition(s)  Diet Patient reports consuming 3 meals a day and 1 snack(s) a day Patient reports that her primary diet is: Regular Patient reports that she does have regular access to food.   Depression Screen    01/24/2022   10:40 AM 08/25/2021   11:26 AM 04/08/2021    3:37 PM 12/28/2020    9:21 AM 10/05/2020    3:30 PM 08/24/2020   11:29 AM 02/18/2020   11:06 AM  PHQ 2/9 Scores  PHQ - 2 Score '5 1 2 '$ 0 3 3 0  PHQ- 9 Score '18 2 9  9 9      '$ Fall Risk    04/11/2022    2:49 PM 01/24/2022   10:43 AM 08/25/2021   11:25 AM 04/08/2021    3:50 PM 12/28/2020    9:21 AM  Fall Risk   Falls in the past year? '1 1 1 1 '$ 0  Number falls in past yr: '1 1 1 1   '$ Injury with Fall? 0 0 0 0   Risk for fall due to : History of fall(s) Impaired mobility;Impaired balance/gait History of fall(s)  History of fall(s);Impaired balance/gait;Impaired mobility;Orthopedic patient;Mental status change   Follow up Falls evaluation completed Falls prevention discussed Education provided Education provided;Falls prevention discussed      Objective:  Melinda Collins seemed alert and oriented and she participated appropriately during our telephone visit.  Blood Pressure Weight BMI  BP Readings from Last 3 Encounters:  03/25/22 118/64  01/24/22 118/81  11/15/21 118/72   Wt Readings from Last 3 Encounters:  03/25/22 174 lb 9.6 oz (79.2 kg)  01/24/22 173 lb (78.5 kg)  10/15/21 167 lb 3.2 oz (75.8 kg)   BMI Readings from Last 1 Encounters:  03/25/22 29.97 kg/m    *Unable to obtain current vital signs, weight, and BMI due to telephone visit type  Hearing/Vision  Melinda did not seem to have difficulty with hearing/understanding during the telephone conversation Reports that she has had a formal eye exam by an eye care professional within the past year Reports that she has not had a formal hearing evaluation within the past year *Unable  to fully assess hearing and vision during telephone visit type  Cognitive Function:    04/11/2022    2:46 PM 04/08/2021    3:39 PM  6CIT Screen  What Year? 0 points 0 points  What month? 0 points 0 points  What time? 0 points 0 points  Count back from 20 0 points 0 points  Months in reverse 4 points 4 points  Repeat phrase 2 points 6 points  Total Score 6 points 10 points   (Normal:0-7, Significant for Dysfunction: >8)  Normal Cognitive Function Screening: No:     Immunization & Health Maintenance Record Immunization History  Administered Date(s) Administered   Influenza, High Dose Seasonal PF 05/18/2016, 05/01/2017, 05/07/2018   Influenza,inj,Quad PF,6+ Mos 04/09/2013, 04/14/2014, 05/06/2015, 05/10/2019   Influenza-Unspecified 06/17/2020   Moderna Sars-Covid-2 Vaccination 09/12/2019, 10/08/2019, 07/21/2020   Pneumococcal Conjugate-13  07/22/2013   Pneumococcal Polysaccharide-23 05/20/2019   Tdap 08/01/2014   Zoster, Live 08/05/2013    Health Maintenance  Topic Date Due   DEXA SCAN  Never done   COVID-19 Vaccine (4 - Moderna risk series) 09/15/2020   Zoster Vaccines- Shingrix (1 of 2) 04/26/2022 (Originally 07/04/1960)   INFLUENZA VACCINE  10/16/2022 (Originally 02/15/2022)   TETANUS/TDAP  08/01/2024   Pneumonia Vaccine 37+ Years old  Completed   Hepatitis C Screening  Completed   HPV VACCINES  Aged Out       Assessment  This is a routine wellness examination for Melinda Collins.  Health Maintenance: Due or Overdue Health Maintenance Due  Topic Date Due   DEXA SCAN  Never done   COVID-19 Vaccine (4 - Moderna risk series) 09/15/2020    Melinda Collins does not need a referral for [employer] Assistance: Care Management:   no Social Work:    no Prescription Assistance:  no Nutrition/Diabetes Education:  no   Plan:  Personalized Goals  Goals Addressed             This Visit's Progress    Patient Stated       04/11/2022 AWV Goal: Fall Prevention  Over the next year, patient will decrease their risk for falls by: Using assistive devices, such as a cane or walker, as needed Identifying fall risks within their home and correcting them by: Removing throw rugs Adding handrails to stairs or ramps Removing clutter and keeping a clear pathway throughout the home Increasing light, especially at night Adding shower handles/bars Raising toilet seat Identifying potential personal risk factors for falls: Medication side effects Incontinence/urgency Vestibular dysfunction Hearing loss Musculoskeletal disorders Neurological disorders Orthostatic hypotension         Personalized Health Maintenance & Screening Recommendations  Influenza vaccine Bone densitometry screening  Lung Cancer Screening Recommended: yes (Low Dose CT Chest recommended if Age 84-80 years, 30 pack-year currently smoking  OR have quit w/in past 15 years) Hepatitis C Screening recommended: no HIV Screening recommended: no  Advanced Directives: Written information was not prepared per patient's request.  Referrals & Orders No orders of the defined types were placed in this encounter.   Follow-up Plan Follow-up with [name], FNP as planned    I have personally reviewed and noted the following in the patient's chart:   Medical and social history Use of alcohol, tobacco or illicit drugs  Current medications and supplements Functional ability and status Nutritional status Physical activity Advanced directives List of other physicians Hospitalizations, surgeries, and ER visits in previous 12 months Vitals Screenings to include cognitive, depression, and falls Referrals  and appointments  In addition, I have reviewed and discussed with Melinda Collins certain preventive protocols, quality metrics, and best practice recommendations. A written personalized care plan for preventive services as well as general preventive health recommendations is available and can be mailed to the patient at her request.      Burnadette Pop  04/11/2022   Patient declined after visit summary

## 2022-04-18 ENCOUNTER — Telehealth: Payer: Self-pay | Admitting: *Deleted

## 2022-04-18 NOTE — Telephone Encounter (Signed)
[name] aware of dose remaining the same

## 2022-04-18 NOTE — Telephone Encounter (Signed)
Description   INR  2.3 (Goal 2-3) Continue 5 mg daily Recheck in 1 to 2 weeks     

## 2022-04-18 NOTE — Telephone Encounter (Signed)
Fax received [facility] PT/INR self testing service Test date/time 04/10/22 853 am INR 2.3

## 2022-04-19 ENCOUNTER — Telehealth: Payer: Self-pay | Admitting: *Deleted

## 2022-04-19 NOTE — Telephone Encounter (Signed)
Fax received [facility]INR PT/INR self testing service Test date/time 04/17/22 849 am INR 2.5

## 2022-04-19 NOTE — Telephone Encounter (Signed)
Description   INR   2.5  (Goal 2-3) Continue 5 mg daily Recheck in 1 to 2 weeks

## 2022-04-25 ENCOUNTER — Telehealth: Payer: Self-pay | Admitting: *Deleted

## 2022-04-25 ENCOUNTER — Ambulatory Visit (HOSPITAL_COMMUNITY): Admission: RE | Admit: 2022-04-25 | Payer: Medicare Other | Source: Ambulatory Visit

## 2022-04-25 NOTE — Telephone Encounter (Signed)
Fax received [facility]INR PT/INR self testing service Test date/time 04/24/22 1116 am INR 2.9

## 2022-05-02 ENCOUNTER — Telehealth: Payer: Self-pay | Admitting: *Deleted

## 2022-05-02 NOTE — Telephone Encounter (Signed)
Description   INR   2.6 Goal 2-3) Continue 5 mg daily Recheck in 1 to 2 weeks     

## 2022-05-02 NOTE — Telephone Encounter (Signed)
Fax received [facility]INR PT/INR self testing service Test date/time 05/02/22 1033 am INR 2.6

## 2022-05-02 NOTE — Telephone Encounter (Signed)
Sent through [facility] per care giver and left message for them to call back from number on file

## 2022-05-06 ENCOUNTER — Other Ambulatory Visit: Payer: Self-pay | Admitting: Family

## 2022-05-06 NOTE — Telephone Encounter (Signed)
Last OV [id number]. Last RF 06/08/21. Next OV none schedule at this time  Vit d level 06/18/2019 23.6

## 2022-05-09 ENCOUNTER — Telehealth: Payer: Self-pay | Admitting: *Deleted

## 2022-05-09 NOTE — Telephone Encounter (Signed)
Description   INR   2.9 (Goal 2-3) Continue 5 mg daily Recheck in 1 to 2 weeks

## 2022-05-09 NOTE — Telephone Encounter (Signed)
Fax received [facility] PT/INR self testing service Test date/time 05/08/22 957 am INR 2.9

## 2022-05-09 NOTE — Telephone Encounter (Signed)
Left detailed message.   

## 2022-05-10 ENCOUNTER — Encounter: Payer: Self-pay | Admitting: Cardiology

## 2022-05-10 ENCOUNTER — Ambulatory Visit: Payer: Medicare Other | Attending: Cardiology | Admitting: Cardiology

## 2022-05-10 VITALS — BP 118/58 | HR 71 | Ht 64.0 in | Wt 180.0 lb

## 2022-05-10 DIAGNOSIS — N1832 Chronic kidney disease, stage 3b: Secondary | ICD-10-CM | POA: Diagnosis not present

## 2022-05-10 DIAGNOSIS — I350 Nonrheumatic aortic (valve) stenosis: Secondary | ICD-10-CM

## 2022-05-10 DIAGNOSIS — I4821 Permanent atrial fibrillation: Secondary | ICD-10-CM | POA: Diagnosis not present

## 2022-05-10 NOTE — Progress Notes (Signed)
Cardiology Office Note  Date: 05/10/2022   ID: SONNA LIPSKY, DOB 1940/10/14, MRN [id number]  PCP:  [name], FNP  Cardiologist:  Rozann Lesches, MD Electrophysiologist:  None   Chief Complaint  Patient presents with   Cardiac follow-up    History of Present Illness: Melinda Collins is a 81 y.o. female last seen in September by Ms. Dunn PA-C, I reviewed the note.  She is here today with family member for a follow-up visit.  Reports chronic dyspnea on exertion, significant functional limitations related to chronic back pain and in a wheelchair today, no chest pain or palpitations.  Follow-up echocardiogram in September revealed LVEF 60 to 65%, normal estimated PASP, severe biatrial enlargement, mild mitral regurgitation, moderate to severe aortic stenosis with dimensionless index 0.29, and paradoxically low mean gradient of 23 mmHg.  We discussed the results of her echocardiogram, she is not interested in pursuing work-up for TAVR, and frankly it is not clear to me that this would markedly impact her functional status given comorbidities.  We also went over her lab work.  She had been consuming more potassium in her diet (bananas and tomatoes) and these have been cut back.  Diuretics are also being limited to as needed use.  She is not on any potassium supplements.  I have recommended that she keep follow-up with her PCP.  Nephrology consultation could be considered if renal function worsens.  Past Medical History:  Diagnosis Date   Aortic insufficiency    Aortic stenosis    Arthritis    Chronic hypoxemic respiratory failure (HCC)    Chronic kidney disease, stage 3a (HCC)    COPD (chronic obstructive pulmonary disease) (HCC)    Depression    HOH (hard of hearing)    Mitral regurgitation    Mixed hyperlipidemia    Permanent atrial fibrillation (HCC)    Pulmonary hypertension (HCC)    Ruptured disk    Tricuspid regurgitation    Uterine cancer [facility])      Past Surgical History:  Procedure Laterality Date   ABDOMINAL HYSTERECTOMY     total   CATARACT EXTRACTION W/PHACO Left 08/25/2014   Procedure: CATARACT EXTRACTION PHACO AND INTRAOCULAR LENS PLACEMENT (IOC);  Surgeon: Tonny Branch, MD;  Location: [facility];  Service: Ophthalmology;  Laterality: Left;  CDE 6.95   CATARACT EXTRACTION W/PHACO Right 09/11/2014   Procedure: CATARACT EXTRACTION PHACO AND INTRAOCULAR LENS PLACEMENT (IOC);  Surgeon: Tonny Branch, MD;  Location: [facility];  Service: Ophthalmology;  Laterality: Right;  CDE:8.11   CERVICAL DISC SURGERY     x2   KYPHOPLASTY     SPINE SURGERY  2013   cervical fusion    Current Outpatient Medications  Medication Sig Dispense Refill   acetaminophen (TYLENOL) 325 MG tablet Take 650 mg by mouth every 6 (six) hours as needed for moderate pain.     albuterol (VENTOLIN HFA) 108 (90 Base) MCG/ACT inhaler SMARTSIG:1-2 Puff(s) Via Inhaler Every 4-6 Hours PRN     ALPRAZolam (XANAX) 0.25 MG tablet Take 1 tablet (0.25 mg total) by mouth at bedtime as needed for anxiety. 30 tablet 2   atorvastatin (LIPITOR) 80 MG tablet TAKE ONE TABLET BY MOUTH DAILY AT 6 PM 30 tablet 5   bismuth subsalicylate (PEPTO BISMOL) 262 MG/15ML suspension Take 30 mLs by mouth every 6 (six) hours as needed.     diclofenac sodium (VOLTAREN) 1 % GEL Apply 2 g topically 4 (four) times daily. 350 g 3   diltiazem (  CARDIZEM CD) 180 MG 24 hr capsule TAKE ONE CAPSULE BY MOUTH DAILY (,,EVENING) 90 capsule 2   DULoxetine (CYMBALTA) 60 MG capsule TAKE TWO (2) CAPSULES BY MOUTH DAILY. ((PER DAUGHTER, ,1 ,AM AND ,1 ,PM)) 60 capsule 5   esomeprazole (NEXIUM) 40 MG capsule TAKE ONE CAPSULE BY MOUTH ONCE DAILY (MORNING PER PT.) 30 capsule 5   fluticasone (FLONASE) 50 MCG/ACT nasal spray USE ONE SPRAY IN EACH NOSTRIL ONCE DAILY. SHAKE GENTLY BEFORE USING. 16 g 5   guaiFENesin (MUCINEX) 600 MG 12 hr tablet Take 1 tablet (600 mg total) by mouth 2 (two) times daily as needed. 60 tablet 1    ketoconazole (NIZORAL) 2 % cream Apply topically daily.     levalbuterol (XOPENEX) 0.63 MG/3ML nebulizer solution INHALE CONTENTS OF 1 VIAL IN NEBULIZER EVERY 6 HOURS AS NEEDED FOR WHEEZING OR SHORTNESS OF BREATH 120 mL 5   loratadine (ALLERGY RELIEF) 10 MG tablet TAKE ONE TABLET BY MOUTH DAILY (,EVENING) 30 tablet 2   Magnesium Hydroxide (DULCOLAX PO) Take 25 mg by mouth daily.     metoprolol succinate (TOPROL-XL) 50 MG 24 hr tablet TAKE ONE TABLET BY MOUTH IN THE EVENING. TAKE WITH OR IMMEDIATELY FOLLOWING A MEAL. (,EVENING) 90 tablet 1   NON FORMULARY Inhale 2-4 L into the lungs daily. oxygen     nystatin (MYCOSTATIN/NYSTOP) powder Apply 1 application topically 3 (three) times daily. 45 g 1   nystatin cream (MYCOSTATIN) APPLY TOPICALLY TWICE DAILY 60 g 2   Spacer/Aero-Holding Chambers DEVI 1 Device by Does not apply route in the morning and at bedtime. 1 each 1   torsemide (DEMADEX) 20 MG tablet Take 1 tablet (20 mg total) by mouth 2 (two) times a week. (Patient taking differently: Take 20 mg by mouth as needed.) 20 tablet 0   traMADol (ULTRAM) 50 MG tablet Take 1-2 tablets (50-100 mg total) by mouth every 8 (eight) hours as needed. 30 tablet 1   TRELEGY ELLIPTA 100-62.5-25 MCG/ACT AEPB INHALE ONE PUFF INTO THE LUNGS ONCE DAILY 60 each 2   vitamin B-12 (CYANOCOBALAMIN) 1000 MCG tablet TAKE ONE TABLET BY MOUTH DAILY WITH BREAKFAST 30 tablet 5   Vitamin D, Ergocalciferol, 50000 units CAPS Take 50,000 Units by mouth every 7 (seven) days. Mondays 12 capsule 2   warfarin (COUMADIN) 5 MG tablet TAKE 1 OR 2 TABLETS BY MOUTH AS DIRECTED BY WARFARIN CLINIC (T1,TABLET DAILY EXCEPT ON TUESDAY TAKE 1 AND 1/2 TABLETS) 180 tablet 0   No current facility-administered medications for this visit.   Allergies:  Baclofen, Niaspan [niacin], and Flexeril [cyclobenzaprine]   ROS: Chronic hearing loss.  Physical Exam: VS:  BP (!) 118/58   Pulse 71   Ht '5\' 4"'$  (1.626 m)   Wt 180 lb (81.6 kg)   SpO2 97%   BMI  30.90 kg/m , BMI Body mass index is 30.9 kg/m.  Wt Readings from Last 3 Encounters:  05/10/22 180 lb (81.6 kg)  03/25/22 174 lb 9.6 oz (79.2 kg)  01/24/22 173 lb (78.5 kg)    General: Patient appears comfortable at rest.  Wearing supplemental oxygen via nasal cannula and in wheelchair. HEENT: Conjunctiva and lids normal. Lungs: Decreased breath sounds without wheezing. Cardiac: Regular rate and rhythm, no S3, 2/6 systolic murmur, no pericardial rub. Extremities: No pitting edema.  ECG:  An ECG dated 03/25/2022 was personally reviewed today and demonstrated:  Atrial fibrillation with left anterior fascicular block, incomplete right bundle branch block, nonspecific ST-T changes.  Recent Labwork: 06/12/2021: Magnesium  2.0 08/25/2021: ALT 18; AST 14 03/25/2022: B Natriuretic Peptide 166.0; BUN 37; Creatinine, Ser 2.06; Hemoglobin 13.2; Platelets 215; Potassium 5.4; Sodium 138     Component Value Date/Time   CHOL 154 06/18/2019 1558   CHOL 121 08/06/2013 1123   TRIG 191 (H) 06/18/2019 1558   TRIG 302 (H) 02/16/2016 1247   TRIG 136 08/06/2013 1123   HDL 59 06/18/2019 1558   HDL 51 02/16/2016 1247   HDL 48 08/06/2013 1123   CHOLHDL 2.6 06/18/2019 1558   LDLCALC 64 06/18/2019 1558   LDLCALC 46 08/06/2013 1123    Other Studies Reviewed Today:  Echocardiogram 03/29/2022:  1. Left ventricular ejection fraction, by estimation, is 60 to 65%. The  left ventricle has normal function. The left ventricle has no regional  wall motion abnormalities. There is mild left ventricular hypertrophy.  Left ventricular diastolic parameters  are indeterminate. The average left ventricular global longitudinal strain  is -18.5 %. The global longitudinal strain is normal.   2. Right ventricular systolic function is normal. The right ventricular  size is normal. There is normal pulmonary artery systolic pressure.   3. Left atrial size was severely dilated.   4. Right atrial size was severely dilated.   5.  The mitral valve is abnormal. Mild mitral valve regurgitation. No  evidence of mitral stenosis. Severe mitral annular calcification.   6. The tricuspid valve is abnormal.   7. The aortic valve has an indeterminant number of cusps. There is severe  calcifcation of the aortic valve. There is severe thickening of the aortic  valve. Aortic valve regurgitation is moderate. Moderate to severe aortic  valve stenosis. Aortic valve  mean gradient measures 23.0 mmHg. Aortic valve peak gradient measures 73.3  mmHg. Aortic valve area, by VTI measures 0.92 cm. DI 0.29   8. The inferior vena cava is normal in size with greater than 50%  respiratory variability, suggesting right atrial pressure of 3 mmHg.   Assessment and Plan:  1.  Degenerative calcific aortic stenosis, moderate to severe range with dimensionless index 0.29 and paradoxically low mean gradient of 23 mmHg.  As discussed above, she is not inclined to pursue further work-up for TAVR and I would agree as I suspect her overall functional capacity would not be markedly improved given her comorbid illnesses.  2.  CKD stage IIIb-IV, recent creatinine 2.0 and potassium 5.4.  She has cut back potassium in her diet, not on oral potassium supplement, and diuretics are being used on an as-needed basis at this time.  Keep follow-up with PCP.  3.  Permanent atrial fibrillation with CHA2DS2-VASc score of 4.  She remains on Cardizem CD and Toprol-XL for heart rate control, no active palpitations.  Also continues on Coumadin with follow-up by PCP.  4.  Severe COPD with chronic hypoxic respiratory failure on supplemental oxygen.  Medication Adjustments/Labs and Tests Ordered: Current medicines are reviewed at length with the patient today.  Concerns regarding medicines are outlined above.   Tests Ordered: No orders of the defined types were placed in this encounter.   Medication Changes: No orders of the defined types were placed in this  encounter.   Disposition:  Follow up  6 months.  Signed, Satira Sark, MD, [facility] 05/10/2022 12:16 PM    [facility], [place], [place] [address] Phone: [phone]; Fax: [phone]

## 2022-05-10 NOTE — Patient Instructions (Signed)

## 2022-05-18 ENCOUNTER — Telehealth: Payer: Self-pay | Admitting: *Deleted

## 2022-05-18 NOTE — Telephone Encounter (Signed)
No answer, mailbox full

## 2022-05-18 NOTE — Telephone Encounter (Signed)
Fax received mdINR PT/INR self testing service Test date/time 05/15/22 936 am INR 2.5

## 2022-05-18 NOTE — Telephone Encounter (Signed)
At goal. Continue current regimen. Repeat in 1-2 weeks.

## 2022-05-27 ENCOUNTER — Telehealth: Payer: Self-pay | Admitting: Family

## 2022-05-27 ENCOUNTER — Telehealth: Payer: Self-pay | Admitting: Family Medicine

## 2022-05-27 NOTE — Telephone Encounter (Signed)
Fax received mdINR PT/INR self testing service Test date/time 05/22/2022 9:03am INR 2.6

## 2022-05-27 NOTE — Telephone Encounter (Signed)
Patient aware and verbalized understanding. °

## 2022-05-27 NOTE — Telephone Encounter (Signed)
Description   INR   2.6 Goal 2-3) Continue 5 mg daily Recheck in 1 to 2 weeks     

## 2022-05-30 NOTE — Telephone Encounter (Signed)
LMOVM CAPS form was faxed on 05/17/22, it was scanned in under media I have printed this out and it is at the front desk for her to pick up.

## 2022-06-01 ENCOUNTER — Telehealth: Payer: Self-pay | Admitting: *Deleted

## 2022-06-01 ENCOUNTER — Ambulatory Visit: Payer: Self-pay | Admitting: Family Medicine

## 2022-06-01 LAB — POCT INR: INR: 2.6 (ref 2.0–3.0)

## 2022-06-01 NOTE — Telephone Encounter (Signed)
Attempted to contact - NA 

## 2022-06-01 NOTE — Progress Notes (Signed)
INR   2.6 (Goal 2-3) Continue 5 mg daily Recheck in 1 to 2 weeks

## 2022-06-01 NOTE — Telephone Encounter (Signed)
Fax received mdINR PT/INR self testing service Test date/time 05/29/22 955 am INR 2.6

## 2022-06-06 ENCOUNTER — Telehealth: Payer: Self-pay | Admitting: Family

## 2022-06-07 ENCOUNTER — Telehealth: Payer: Self-pay | Admitting: *Deleted

## 2022-06-07 NOTE — Telephone Encounter (Signed)
Called patient no answer sent to [facility] per caregiver request

## 2022-06-07 NOTE — Telephone Encounter (Signed)
Description   INR   2.4 (Goal 2-3) Continue 5 mg daily Recheck in 1 to 2 weeks

## 2022-06-07 NOTE — Telephone Encounter (Signed)
Asking about part that had indicate your provider of choice for available case management entities, [facility] or [place]. This page was scanned into patients chart but not the page which showed which one was selected. [name] will try to get them to send her another one if she can not find her copy.

## 2022-06-07 NOTE — Telephone Encounter (Signed)
Fax received mdINR PT/INR self testing service Test date/time 06/05/22 924 am INR 2.4

## 2022-06-08 NOTE — Telephone Encounter (Signed)
Attempted to call pt a 3rd time - left vm - mailed lab letter

## 2022-06-13 ENCOUNTER — Telehealth: Payer: Self-pay | Admitting: *Deleted

## 2022-06-13 NOTE — Telephone Encounter (Signed)
Description   INR   2.6 Goal 2-3) Continue 5 mg daily Recheck in 1 to 2 weeks     

## 2022-06-13 NOTE — Telephone Encounter (Signed)
Fax received mdINR PT/INR self testing service Test date/time 06/12/22 850 am INR 2.6

## 2022-06-13 NOTE — Telephone Encounter (Signed)
Sent through [employer]

## 2022-06-20 ENCOUNTER — Other Ambulatory Visit: Payer: Self-pay | Admitting: Family

## 2022-06-20 DIAGNOSIS — J439 Emphysema, unspecified: Secondary | ICD-10-CM

## 2022-06-24 ENCOUNTER — Telehealth: Payer: Self-pay | Admitting: *Deleted

## 2022-06-24 NOTE — Telephone Encounter (Signed)
Fax received mdINR PT/INR self testing service Test date/time 06/19/22 1003 am INR 2.7

## 2022-06-24 NOTE — Telephone Encounter (Signed)
Description   INR  2.7 (Goal 2-3) Continue 5 mg daily Recheck in 1 to 2 weeks    Caryl Pina, MD [facility] 06/24/2022, 2:29 PM

## 2022-06-24 NOTE — Telephone Encounter (Signed)
Left message informing of recommendations and to call back with any concerns.

## 2022-06-28 ENCOUNTER — Telehealth: Payer: Self-pay

## 2022-06-28 NOTE — Telephone Encounter (Signed)
Fax received mdINR PT/INR self testing service Test date/time 06/26/22 11:14 am INR 2.6

## 2022-06-29 ENCOUNTER — Other Ambulatory Visit: Payer: Self-pay | Admitting: Physician Assistant

## 2022-06-29 ENCOUNTER — Other Ambulatory Visit: Payer: Self-pay | Admitting: Family

## 2022-06-29 DIAGNOSIS — J439 Emphysema, unspecified: Secondary | ICD-10-CM

## 2022-06-29 NOTE — Telephone Encounter (Signed)
At goal. Continue current regimen. Recheck in 2 weeks.

## 2022-06-29 NOTE — Telephone Encounter (Signed)
Aware to continue current dose

## 2022-06-29 NOTE — Telephone Encounter (Signed)
Last office visit 01/24/22 On current med list as a historical med

## 2022-06-30 ENCOUNTER — Other Ambulatory Visit: Payer: Self-pay | Admitting: Family

## 2022-07-05 ENCOUNTER — Telehealth: Payer: Self-pay | Admitting: *Deleted

## 2022-07-05 NOTE — Telephone Encounter (Signed)
Sent through [employer]

## 2022-07-05 NOTE — Telephone Encounter (Signed)
Description   INR  2.3 (Goal 2-3) Continue 5 mg daily Recheck in 1 to 2 weeks

## 2022-07-05 NOTE — Telephone Encounter (Signed)
Fax received mdINR PT/INR self testing service Test date/time 07/03/22 1040 am INR 2.3

## 2022-07-07 ENCOUNTER — Telehealth (INDEPENDENT_AMBULATORY_CARE_PROVIDER_SITE_OTHER): Payer: Medicare Other | Admitting: Family

## 2022-07-07 ENCOUNTER — Telehealth: Payer: Self-pay | Admitting: Family

## 2022-07-07 ENCOUNTER — Encounter: Payer: Self-pay | Admitting: Family

## 2022-07-07 DIAGNOSIS — Z20828 Contact with and (suspected) exposure to other viral communicable diseases: Secondary | ICD-10-CM | POA: Diagnosis not present

## 2022-07-07 MED ORDER — OSELTAMIVIR PHOSPHATE 75 MG PO CAPS: 75.0000 mg | ORAL_CAPSULE | Freq: Every day | ORAL | 0 refills | Status: DC

## 2022-07-07 NOTE — Telephone Encounter (Signed)
Please make telephone or video visit.

## 2022-07-07 NOTE — Progress Notes (Signed)
Virtual Visit Consent   [place], you are scheduled for a virtual visit with a [facility] provider today. Just as with appointments in the office, your consent must be obtained to participate. Your consent will be active for this visit and any virtual visit you may have with one of our providers in the next 365 days. If you have a MyChart account, a copy of this consent can be sent to you electronically.  As this is a virtual visit, video technology does not allow for your provider to perform a traditional examination. This may limit your provider's ability to fully assess your condition. If your provider identifies any concerns that need to be evaluated in person or the need to arrange testing (such as labs, EKG, etc.), we will make arrangements to do so. Although advances in technology are sophisticated, we cannot ensure that it will always work on either your end or our end. If the connection with a video visit is poor, the visit may have to be switched to a telephone visit. With either a video or telephone visit, we are not always able to ensure that we have a secure connection.  By engaging in this virtual visit, you consent to the provision of healthcare and authorize for your insurance to be billed (if applicable) for the services provided during this visit. Depending on your insurance coverage, you may receive a charge related to this service.  I need to obtain your verbal consent now. Are you willing to proceed with your visit today? Melinda Collins has provided verbal consent on 07/07/2022 for a virtual visit (video or telephone). Evelina Dun, FNP  Date: 07/07/2022 4:53 PM  Virtual Visit via Video Note   I, Evelina Dun, connected with  Melinda Collins  ([id number], September 08, 1940) on 07/07/22 at  4:30 PM EST by a video-enabled telemedicine application and verified that I am speaking with the correct person using two identifiers.  Location: Patient: Virtual Visit Location  Patient: Home Provider: Virtual Visit Location Provider: Home Office   I discussed the limitations of evaluation and management by telemedicine and the availability of in person appointments. The patient expressed understanding and agreed to proceed.    History of Present Illness: Melinda Collins is a 81 y.o. who identifies as a female who was assigned female at birth, and is being seen today for exposure to flu A. She multiple risk factors that include A Fib, chronic respiratory failure,  and COPD. Daughter wants tamiflu.  HPI: HPI  Problems:  Patient Active Problem List   Diagnosis Date Noted   Congenital heart failure ([place]) 10/15/2021   Supratherapeutic INR 06/11/2021   Chronic respiratory failure with hypoxia ([place]) 06/10/2021   Lobar pneumonia ([place]) 78/29/5621   Metabolic encephalopathy    Palliative care patient 05/27/2021   COPD exacerbation ([place])    Acute on chronic respiratory failure ([place]) 04/25/2021   Hyperkalemia 04/25/2021   GAD (generalized anxiety disorder) 04/13/2021   Pulmonary emphysema ([place]) 08/24/2020   Vitamin D deficiency 06/21/2019   Depression, recurrent ([place]) 06/21/2019   Lumbar spondylosis with myelopathy 09/11/2017   Pulmonary nodules 02/08/2017   Aortic atherosclerosis ([place]) 04/22/2016   Permanent atrial fibrillation ([place]) 30/86/5784   Metabolic syndrome 69/62/9528   History of uterine cancer 12/03/2013   Chronic midline low back pain without sciatica 05/23/2013   High risk medication use 05/23/2013   Swelling of limb 05/03/2013   Hyperlipidemia 02/10/2009   Pulmonary hypertension ([place]) 02/10/2009   Atrial fibrillation with RVR (  [place]) 02/10/2009    Allergies:  Allergies  Allergen Reactions   Baclofen     Feels crazy   Niaspan [Niacin]    Flexeril [Cyclobenzaprine]     Feels crazy   Medications:  Current Outpatient Medications:    oseltamivir (TAMIFLU) 75 MG capsule, Take 1 capsule (75 mg total) by mouth daily., Disp: 10 capsule, Rfl: 0    acetaminophen (TYLENOL) 325 MG tablet, Take 650 mg by mouth every 6 (six) hours as needed for moderate pain., Disp: , Rfl:    albuterol (VENTOLIN HFA) 108 (90 Base) MCG/ACT inhaler, INHALE 1-2 PUFFS INTO THE LUNGS EVERY 4 TO 6 HOURS AS NEEDED FOR SHORTNESS OF BREATH, Disp: 8.5 g, Rfl: 3   ALPRAZolam (XANAX) 0.25 MG tablet, Take 1 tablet (0.25 mg total) by mouth at bedtime as needed for anxiety., Disp: 30 tablet, Rfl: 2   atorvastatin (LIPITOR) 80 MG tablet, TAKE ONE TABLET BY MOUTH DAILY AT 6 PM, Disp: 30 tablet, Rfl: 5   bismuth subsalicylate (PEPTO BISMOL) 262 MG/15ML suspension, Take 30 mLs by mouth every 6 (six) hours as needed., Disp: , Rfl:    cyanocobalamin (VITAMIN B12) 1000 MCG tablet, TAKE ONE TABLET BY MOUTH DAILY WITH BREAKFAST, Disp: 90 tablet, Rfl: 1   diclofenac sodium (VOLTAREN) 1 % GEL, Apply 2 g topically 4 (four) times daily., Disp: 350 g, Rfl: 3   diltiazem (CARDIZEM CD) 180 MG 24 hr capsule, TAKE ONE CAPSULE BY MOUTH DAILY (,,EVENING), Disp: 90 capsule, Rfl: 2   DULoxetine (CYMBALTA) 60 MG capsule, TAKE TWO (2) CAPSULES BY MOUTH DAILY. ((PER DAUGHTER, ,1 ,AM AND ,1 ,PM)), Disp: 60 capsule, Rfl: 5   esomeprazole (NEXIUM) 40 MG capsule, TAKE ONE CAPSULE BY MOUTH ONCE DAILY (MORNING PER PT.), Disp: 30 capsule, Rfl: 5   fluticasone (FLONASE) 50 MCG/ACT nasal spray, USE ONE SPRAY IN EACH NOSTRIL ONCE DAILY. SHAKE GENTLY BEFORE USING., Disp: 16 g, Rfl: 5   Fluticasone-Umeclidin-Vilant (TRELEGY ELLIPTA) 100-62.5-25 MCG/ACT AEPB, INHALE ONE PUFF INTO THE LUNGS ONCE DAILY (NEEDS TO BE SEEN BEFORE NEXT REFILL), Disp: 60 each, Rfl: 0   guaiFENesin (MUCINEX) 600 MG 12 hr tablet, Take 1 tablet (600 mg total) by mouth 2 (two) times daily as needed., Disp: 60 tablet, Rfl: 1   ketoconazole (NIZORAL) 2 % cream, Apply topically daily., Disp: , Rfl:    levalbuterol (XOPENEX) 0.63 MG/3ML nebulizer solution, INHALE CONTENTS OF 1 VIAL IN NEBULIZER EVERY 6 HOURS AS NEEDED FOR WHEEZING OR SHORTNESS OF  BREATH, Disp: 120 mL, Rfl: 5   loratadine (ALLERGY RELIEF) 10 MG tablet, TAKE ONE TABLET BY MOUTH DAILY (,EVENING), Disp: 90 tablet, Rfl: 1   Magnesium Hydroxide (DULCOLAX PO), Take 25 mg by mouth daily., Disp: , Rfl:    metoprolol succinate (TOPROL-XL) 50 MG 24 hr tablet, TAKE ONE TABLET BY MOUTH IN THE EVENING. TAKE WITH OR IMMEDIATELY FOLLOWING A MEAL. (,EVENING), Disp: 90 tablet, Rfl: 1   NON FORMULARY, Inhale 2-4 L into the lungs daily. oxygen, Disp: , Rfl:    nystatin (MYCOSTATIN/NYSTOP) powder, Apply 1 application topically 3 (three) times daily., Disp: 45 g, Rfl: 1   nystatin cream (MYCOSTATIN), APPLY TOPICALLY TWICE DAILY, Disp: 60 g, Rfl: 2   Spacer/Aero-Holding Chambers DEVI, 1 Device by Does not apply route in the morning and at bedtime., Disp: 1 each, Rfl: 1   torsemide (DEMADEX) 20 MG tablet, TAKE ONE TABLET BY MOUTH TWICE WEEKLY, Disp: 20 tablet, Rfl: 0   traMADol (ULTRAM) 50 MG tablet, Take 1-2 tablets (50-100  mg total) by mouth every 8 (eight) hours as needed., Disp: 30 tablet, Rfl: 1   Vitamin D, Ergocalciferol, 50000 units CAPS, Take 50,000 Units by mouth every 7 (seven) days. Mondays, Disp: 12 capsule, Rfl: 2   warfarin (COUMADIN) 5 MG tablet, TAKE 1 OR 2 TABLETS BY MOUTH AS DIRECTED BY WARFARIN CLINIC (T1,TABLET DAILY EXCEPT ON TUESDAY TAKE 1 AND 1/2 TABLETS), Disp: 180 tablet, Rfl: 0  Observations/Objective: Patient is well-developed, well-nourished in no acute distress.  Resting comfortably  at home.  Head is normocephalic, atraumatic.  No labored breathing.  Speech is clear and coherent with logical content.  Patient is alert and oriented at baseline.    Assessment and Plan: 1. Exposure to influenza - oseltamivir (TAMIFLU) 75 MG capsule; Take 1 capsule (75 mg total) by mouth daily.  Dispense: 10 capsule; Refill: 0  Force fluids  Start Tamiflu daily, if develops symptoms start BID.  Follow up if symptoms worsen or do not improve   Follow Up Instructions: I  discussed the assessment and treatment plan with the patient. The patient was provided an opportunity to ask questions and all were answered. The patient agreed with the plan and demonstrated an understanding of the instructions.  A copy of instructions were sent to the patient via MyChart unless otherwise noted below.     The patient was advised to call back or seek an in-person evaluation if the symptoms worsen or if the condition fails to improve as anticipated.  Time:  I spent 7 minutes with the patient via telehealth technology discussing the above problems/concerns.    Evelina Dun, FNP

## 2022-07-07 NOTE — Telephone Encounter (Signed)
Appt made

## 2022-07-07 NOTE — Patient Instructions (Signed)

## 2022-07-08 ENCOUNTER — Telehealth: Payer: Medicare Other

## 2022-07-15 ENCOUNTER — Telehealth: Payer: Self-pay | Admitting: *Deleted

## 2022-07-15 NOTE — Telephone Encounter (Signed)
INR therapeutic. Continue current regimen

## 2022-07-15 NOTE — Telephone Encounter (Signed)
Fax received mdINR PT/INR self testing service Test date/time 07/10/22 925 am INR 2.6

## 2022-07-15 NOTE — Telephone Encounter (Signed)
Aware of INR and to continue current regimen.

## 2022-07-26 ENCOUNTER — Telehealth: Payer: Self-pay | Admitting: *Deleted

## 2022-07-26 NOTE — Telephone Encounter (Signed)
Fax received mdINR PT/INR self testing service Test date/time 07/24/22 1043 INR 2.4

## 2022-07-26 NOTE — Telephone Encounter (Signed)
Description   INR  2.4 (Goal 2-3) Continue 5 mg daily Recheck in 1 to 2 weeks

## 2022-07-26 NOTE — Telephone Encounter (Signed)
SENT THROUGH MYCHART  

## 2022-07-26 NOTE — Telephone Encounter (Signed)
Fax received mdINR PT/INR self testing service Test date/time 07/17/22 1043 am INR 2.8

## 2022-07-28 ENCOUNTER — Other Ambulatory Visit: Payer: Self-pay | Admitting: Family

## 2022-07-28 DIAGNOSIS — G8929 Other chronic pain: Secondary | ICD-10-CM

## 2022-07-28 DIAGNOSIS — F339 Major depressive disorder, recurrent, unspecified: Secondary | ICD-10-CM

## 2022-07-28 DIAGNOSIS — M4716 Other spondylosis with myelopathy, lumbar region: Secondary | ICD-10-CM

## 2022-08-02 ENCOUNTER — Telehealth: Payer: Self-pay | Admitting: *Deleted

## 2022-08-02 NOTE — Telephone Encounter (Signed)
Fax received mdINR PT/INR self testing service Test date/time 07/31/22 1050 am INR 2.6

## 2022-08-02 NOTE — Telephone Encounter (Signed)
Description   INR   2.6 Goal 2-3) Continue 5 mg daily Recheck in 1 to 2 weeks     

## 2022-08-12 ENCOUNTER — Other Ambulatory Visit: Payer: Self-pay | Admitting: Family

## 2022-08-12 ENCOUNTER — Telehealth: Payer: Self-pay | Admitting: *Deleted

## 2022-08-12 DIAGNOSIS — J439 Emphysema, unspecified: Secondary | ICD-10-CM

## 2022-08-12 NOTE — Telephone Encounter (Signed)
Description   INR   2.4 Goal 2-3) Continue 5 mg daily Recheck in 1 to 2 weeks

## 2022-08-12 NOTE — Telephone Encounter (Signed)
Patient aware and verbalizes understanding. 

## 2022-08-12 NOTE — Telephone Encounter (Signed)
Fax received mdINR PT/INR self testing service Test date/time 08/07/22 848 am INR 2.4

## 2022-08-15 MED ORDER — TRELEGY ELLIPTA 100-62.5-25 MCG/ACT IN AEPB: INHALATION_SPRAY | RESPIRATORY_TRACT | 0 refills | Status: DC

## 2022-08-15 NOTE — Addendum Note (Signed)
Addended by: Antonietta Barcelona D on: 08/15/2022 09:45 AM   Modules accepted: Orders

## 2022-08-15 NOTE — Telephone Encounter (Signed)
Appt scheduled with [name] 09/06/22 please send refill to pharmacy

## 2022-08-15 NOTE — Telephone Encounter (Signed)
[facility] NTBS 30 days given 06/20/22

## 2022-08-26 ENCOUNTER — Telehealth: Payer: Self-pay | Admitting: *Deleted

## 2022-08-26 NOTE — Telephone Encounter (Signed)
Melinda Collins

## 2022-08-26 NOTE — Telephone Encounter (Signed)
Description   INR   2.6 Goal 2-3) Continue 5 mg daily Recheck in 1 to 2 weeks

## 2022-08-26 NOTE — Telephone Encounter (Signed)
2 results received mdINR PT/INR self testing service Test date/time 08/14/22 1034 am INR 2.4  Test date/time 08/21/22 1034 am INR 2.6

## 2022-09-02 ENCOUNTER — Telehealth: Payer: Self-pay | Admitting: *Deleted

## 2022-09-02 NOTE — Telephone Encounter (Signed)
Description   INR   2.7 Goal 2-3) Continue 5 mg daily Recheck in 1 to 2 weeks

## 2022-09-02 NOTE — Telephone Encounter (Signed)
Sent through my chart per what patient caregiver wants

## 2022-09-02 NOTE — Telephone Encounter (Signed)
Fax received mdINR PT/INR self testing service Test date/time 08/28/22 1018 am INR 2.7

## 2022-09-06 ENCOUNTER — Ambulatory Visit (INDEPENDENT_AMBULATORY_CARE_PROVIDER_SITE_OTHER): Payer: 59 | Admitting: Family

## 2022-09-06 ENCOUNTER — Encounter: Payer: Self-pay | Admitting: Family

## 2022-09-06 VITALS — BP 152/90 | HR 57 | Temp 97.0°F | Ht 63.0 in | Wt 184.0 lb

## 2022-09-06 DIAGNOSIS — I4891 Unspecified atrial fibrillation: Secondary | ICD-10-CM | POA: Diagnosis not present

## 2022-09-06 DIAGNOSIS — F339 Major depressive disorder, recurrent, unspecified: Secondary | ICD-10-CM

## 2022-09-06 DIAGNOSIS — J9611 Chronic respiratory failure with hypoxia: Secondary | ICD-10-CM

## 2022-09-06 DIAGNOSIS — G8929 Other chronic pain: Secondary | ICD-10-CM

## 2022-09-06 DIAGNOSIS — I700 Atherosclerosis of aorta: Secondary | ICD-10-CM

## 2022-09-06 DIAGNOSIS — J439 Emphysema, unspecified: Secondary | ICD-10-CM

## 2022-09-06 DIAGNOSIS — E782 Mixed hyperlipidemia: Secondary | ICD-10-CM

## 2022-09-06 DIAGNOSIS — M4716 Other spondylosis with myelopathy, lumbar region: Secondary | ICD-10-CM

## 2022-09-06 DIAGNOSIS — B372 Candidiasis of skin and nail: Secondary | ICD-10-CM | POA: Diagnosis not present

## 2022-09-06 DIAGNOSIS — Z515 Encounter for palliative care: Secondary | ICD-10-CM

## 2022-09-06 DIAGNOSIS — M545 Low back pain, unspecified: Secondary | ICD-10-CM

## 2022-09-06 DIAGNOSIS — F411 Generalized anxiety disorder: Secondary | ICD-10-CM | POA: Diagnosis not present

## 2022-09-06 MED ORDER — NYSTATIN 100000 UNIT/GM EX POWD: 1.0000 | Freq: Three times a day (TID) | CUTANEOUS | 1 refills | Status: DC

## 2022-09-06 MED ORDER — TRELEGY ELLIPTA 100-62.5-25 MCG/ACT IN AEPB: INHALATION_SPRAY | RESPIRATORY_TRACT | 0 refills | Status: DC

## 2022-09-06 MED ORDER — DILTIAZEM HCL ER COATED BEADS 180 MG PO CP24: ORAL_CAPSULE | ORAL | 2 refills | Status: DC

## 2022-09-06 MED ORDER — TORSEMIDE 20 MG PO TABS: 20.0000 mg | ORAL_TABLET | Freq: Every day | ORAL | 0 refills | Status: DC

## 2022-09-06 MED ORDER — LEVALBUTEROL HCL 0.63 MG/3ML IN NEBU: INHALATION_SOLUTION | RESPIRATORY_TRACT | 5 refills | Status: DC

## 2022-09-06 MED ORDER — DULOXETINE HCL 60 MG PO CPEP: 120.0000 mg | ORAL_CAPSULE | Freq: Every day | ORAL | 0 refills | Status: DC

## 2022-09-06 MED ORDER — ATORVASTATIN CALCIUM 80 MG PO TABS: ORAL_TABLET | ORAL | 0 refills | Status: DC

## 2022-09-06 MED ORDER — METOPROLOL SUCCINATE ER 50 MG PO TB24
ORAL_TABLET | ORAL | 1 refills | Status: DC
Start: 2022-09-06 — End: 2023-07-14

## 2022-09-06 MED ORDER — VITAMIN D (ERGOCALCIFEROL) 50000 UNITS PO CAPS: 50000.0000 [IU] | ORAL_CAPSULE | ORAL | 2 refills | Status: DC

## 2022-09-06 MED ORDER — FLUTICASONE PROPIONATE 50 MCG/ACT NA SUSP: NASAL | 5 refills | Status: DC

## 2022-09-06 MED ORDER — ALPRAZOLAM 0.25 MG PO TABS: 0.2500 mg | ORAL_TABLET | Freq: Every evening | ORAL | 2 refills | Status: DC | PRN

## 2022-09-06 MED ORDER — WARFARIN SODIUM 5 MG PO TABS: ORAL_TABLET | ORAL | 0 refills | Status: DC

## 2022-09-06 MED ORDER — ALBUTEROL SULFATE HFA 108 (90 BASE) MCG/ACT IN AERS: INHALATION_SPRAY | RESPIRATORY_TRACT | 3 refills | Status: DC

## 2022-09-06 MED ORDER — ESOMEPRAZOLE MAGNESIUM 40 MG PO CPDR: DELAYED_RELEASE_CAPSULE | ORAL | 0 refills | Status: DC

## 2022-09-06 NOTE — Patient Instructions (Signed)
Health Maintenance After Age 81 After age 81, you are at a higher risk for certain long-term diseases and infections as well as injuries from falls. Falls are a major cause of broken bones and head injuries in people who are older than age 81. Getting regular preventive care can help to keep you healthy and well. Preventive care includes getting regular testing and making lifestyle changes as recommended by your health care provider. Talk with your health care provider about: Which screenings and tests you should have. A screening is a test that checks for a disease when you have no symptoms. A diet and exercise plan that is right for you. What should I know about screenings and tests to prevent falls? Screening and testing are the best ways to find a health problem early. Early diagnosis and treatment give you the best chance of managing medical conditions that are common after age 81. Certain conditions and lifestyle choices may make you more likely to have a fall. Your health care provider may recommend: Regular vision checks. Poor vision and conditions such as cataracts can make you more likely to have a fall. If you wear glasses, make sure to get your prescription updated if your vision changes. Medicine review. Work with your health care provider to regularly review all of the medicines you are taking, including over-the-counter medicines. Ask your health care provider about any side effects that may make you more likely to have a fall. Tell your health care provider if any medicines that you take make you feel dizzy or sleepy. Strength and balance checks. Your health care provider may recommend certain tests to check your strength and balance while standing, walking, or changing positions. Foot health exam. Foot pain and numbness, as well as not wearing proper footwear, can make you more likely to have a fall. Screenings, including: Osteoporosis screening. Osteoporosis is a condition that causes  the bones to get weaker and break more easily. Blood pressure screening. Blood pressure changes and medicines to control blood pressure can make you feel dizzy. Depression screening. You may be more likely to have a fall if you have a fear of falling, feel depressed, or feel unable to do activities that you used to do. Alcohol use screening. Using too much alcohol can affect your balance and may make you more likely to have a fall. Follow these instructions at home: Lifestyle Do not drink alcohol if: Your health care provider tells you not to drink. If you drink alcohol: Limit how much you have to: 0-1 drink a day for women. 0-2 drinks a day for men. Know how much alcohol is in your drink. In the [place] one drink equals one 12 oz bottle of beer (355 mL), one 5 oz glass of wine (148 mL), or one 1 oz glass of hard liquor (44 mL). Do not use any products that contain nicotine or tobacco. These products include cigarettes, chewing tobacco, and vaping devices, such as e-cigarettes. If you need help quitting, ask your health care provider. Activity  Follow a regular exercise program to stay fit. This will help you maintain your balance. Ask your health care provider what types of exercise are appropriate for you. If you need a cane or walker, use it as recommended by your health care provider. Wear supportive shoes that have nonskid soles. Safety  Remove any tripping hazards, such as rugs, cords, and clutter. Install safety equipment such as grab bars in bathrooms and safety rails on stairs. Keep rooms and walkways   well-lit. General instructions Talk with your health care provider about your risks for falling. Tell your health care provider if: You fall. Be sure to tell your health care provider about all falls, even ones that seem minor. You feel dizzy, tiredness (fatigue), or off-balance. Take over-the-counter and prescription medicines only as told by your health care provider. These include  supplements. Eat a healthy diet and maintain a healthy weight. A healthy diet includes low-fat dairy products, low-fat (lean) meats, and fiber from whole grains, beans, and lots of fruits and vegetables. Stay current with your vaccines. Schedule regular health, dental, and eye exams. Summary Having a healthy lifestyle and getting preventive care can help to protect your health and wellness after age 81. Screening and testing are the best way to find a health problem early and help you avoid having a fall. Early diagnosis and treatment give you the best chance for managing medical conditions that are more common for people who are older than age 81. Falls are a major cause of broken bones and head injuries in people who are older than age 81. Take precautions to prevent a fall at home. Work with your health care provider to learn what changes you can make to improve your health and wellness and to prevent falls. This information is not intended to replace advice given to you by your health care provider. Make sure you discuss any questions you have with your health care provider. Document Revised: 11/23/2020 Document Reviewed: 11/23/2020 Elsevier Patient Education  2023 [employer]  

## 2022-09-06 NOTE — Progress Notes (Signed)
Subjective:    Patient ID: Melinda Collins, female    DOB: 11/14/1940, 82 y.o.   MRN: [id number]  Chief Complaint  Patient presents with   Medical Management of Chronic Issues   PT presents to the office today for chronic follow up.  We decreased her demadex just as needed. She has  taken 1 times a week. She has gained 4 lb, but states she has been increasing her eating. Denies any fluid.      09/06/2022    2:12 PM 05/10/2022   11:54 AM 03/25/2022    1:15 PM  Last 3 Weights  Weight (lbs) 184 lb 180 lb 174 lb 9.6 oz  Weight (kg) 83.462 kg 81.647 kg 79.198 kg       She is followed by Palliative care. She is followed by Ortho and getting back injections. She is followed by Cardiologists.    She reports her breathing is better and only using her 2 L oxygen as needed. She has COPD and quit smoking 07/2020.    She has aortic atherosclerosis and takes Lipitor daily.    Pain in her back is worse. She can not tolerate the oxycodone. She has only been taking tylenol. Only takes xanax as needed.    She has an appointment with Neurosurgeon for back injection March 21. States her pain is 10 out 10 and can not get up and walk. Anxiety Presents for follow-up visit. Symptoms include excessive worry, irritability, nervous/anxious behavior and restlessness. Symptoms occur occasionally. The severity of symptoms is moderate.    Depression        This is a chronic problem.  The current episode started more than 1 year ago.   The problem occurs intermittently.  Associated symptoms include helplessness, hopelessness, restlessness and sad.  Past treatments include SNRIs - Serotonin and norepinephrine reuptake inhibitors.  Past medical history includes anxiety.   Back Pain This is a chronic problem. The current episode started more than 1 year ago. The problem occurs intermittently. The pain is present in the lumbar spine. The quality of the pain is described as aching. The pain is at a severity of  10/10. The pain is moderate. She has tried analgesics for the symptoms. The treatment provided mild relief.  Rash This is a recurrent problem. The current episode started more than 1 month ago. The problem has been waxing and waning since onset. Location: under bilateral breast.      Review of Systems  Constitutional:  Positive for irritability.  Musculoskeletal:  Positive for back pain.  Skin:  Positive for rash.  Psychiatric/Behavioral:  Positive for depression. The patient is nervous/anxious.   All other systems reviewed and are negative.      Objective:   Physical Exam Vitals reviewed.  Constitutional:      General: She is not in acute distress.    Appearance: She is well-developed. She is obese.  HENT:     Head: Normocephalic and atraumatic.     Right Ear: Tympanic membrane normal.     Left Ear: Tympanic membrane normal.  Eyes:     Pupils: Pupils are equal, round, and reactive to light.  Neck:     Thyroid: No thyromegaly.  Cardiovascular:     Rate and Rhythm: Normal rate and regular rhythm.     Heart sounds: Normal heart sounds. No murmur heard. Pulmonary:     Effort: Pulmonary effort is normal. No respiratory distress.     Breath sounds: Normal breath sounds. No wheezing.  Abdominal:     General: Bowel sounds are normal. There is no distension.     Palpations: Abdomen is soft.     Tenderness: There is no abdominal tenderness.  Musculoskeletal:        General: No tenderness. Normal range of motion.     Cervical back: Normal range of motion and neck supple.  Skin:    General: Skin is warm and dry.  Neurological:     Mental Status: She is alert and oriented to person, place, and time.     Cranial Nerves: No cranial nerve deficit.     Motor: Weakness present.     Gait: Gait abnormal.     Deep Tendon Reflexes: Reflexes are normal and symmetric.  Psychiatric:        Behavior: Behavior normal.        Thought Content: Thought content normal.        Judgment:  Judgment normal.       BP (!) 152/90   Pulse (!) 57   Temp (!) 97 F (36.1 C) (Temporal)   Ht 5' 3"$  (1.6 m)   Wt 184 lb (83.5 kg)   SpO2 100%   BMI 32.59 kg/m      Assessment & Plan:   Melinda Collins comes in today with chief complaint of Medical Management of Chronic Issues   Diagnosis and orders addressed:  1. Emphysema, unspecified ([place]) - albuterol (VENTOLIN HFA) 108 (90 Base) MCG/ACT inhaler; INHALE 1-2 PUFFS INTO THE LUNGS EVERY 4 TO 6 HOURS AS NEEDED FOR SHORTNESS OF BREATH  Dispense: 8.5 g; Refill: 3 - CMP14+EGFR - CBC with Differential/Platelet  2. GAD (generalized anxiety disorder) - ALPRAZolam (XANAX) 0.25 MG tablet; Take 1 tablet (0.25 mg total) by mouth at bedtime as needed for anxiety.  Dispense: 30 tablet; Refill: 2 - CMP14+EGFR - CBC with Differential/Platelet  3. Lumbar spondylosis with myelopathy - DULoxetine (CYMBALTA) 60 MG capsule; Take 2 capsules (120 mg total) by mouth daily. (Per Daughter 1 AM and ! PM) (NEEDS TO BE SEEN BEFORE NEXT REFILL)  Dispense: 60 capsule; Refill: 0 - CMP14+EGFR - CBC with Differential/Platelet  4. Depression, recurrent ([place]) - DULoxetine (CYMBALTA) 60 MG capsule; Take 2 capsules (120 mg total) by mouth daily. (Per Daughter 1 AM and ! PM) (NEEDS TO BE SEEN BEFORE NEXT REFILL)  Dispense: 60 capsule; Refill: 0 - CMP14+EGFR - CBC with Differential/Platelet  5. Chronic midline low back pain without sciatica - DULoxetine (CYMBALTA) 60 MG capsule; Take 2 capsules (120 mg total) by mouth daily. (Per Daughter 1 AM and ! PM) (NEEDS TO BE SEEN BEFORE NEXT REFILL)  Dispense: 60 capsule; Refill: 0 - PR CANNULA NASAL - CMP14+EGFR - CBC with Differential/Platelet  6. Pulmonary emphysema, unspecified emphysema type ([facility]) - fluticasone (FLONASE) 50 MCG/ACT nasal spray; USE ONE SPRAY IN EACH NOSTRIL ONCE DAILY. SHAKE GENTLY BEFORE USING.  Dispense: 16 g; Refill: 5 - Fluticasone-Umeclidin-Vilant (TRELEGY ELLIPTA) 100-62.5-25  MCG/ACT AEPB; INHALE ONE PUFF INTO THE LUNGS ONCE DAILY  Dispense: 60 each; Refill: 0 - CMP14+EGFR - CBC with Differential/Platelet  7. Candidal skin infection - nystatin (MYCOSTATIN/NYSTOP) powder; Apply 1 Application topically 3 (three) times daily.  Dispense: 45 g; Refill: 1 - CMP14+EGFR - CBC with Differential/Platelet  8. Mixed hyperlipidemia  - atorvastatin (LIPITOR) 80 MG tablet; TAKE ONE TABLET BY MOUTH DAILY AT 6 PM (NEEDS TO BE SEEN BEFORE NEXT REFILL)  Dispense: 30 tablet; Refill: 0 - CMP14+EGFR - CBC with Differential/Platelet  9. Atrial fibrillation with  RVR (HCC) - diltiazem (CARDIZEM CD) 180 MG 24 hr capsule; TAKE ONE CAPSULE BY MOUTH DAILY (,,EVENING)  Dispense: 90 capsule; Refill: 2 - warfarin (COUMADIN) 5 MG tablet; TAKE 1 OR 2 TABLETS BY MOUTH AS DIRECTED BY [facility] (T1,TABLET DAILY EXCEPT ON TUESDAY TAKE 1 AND 1/2 TABLETS)  Dispense: 180 tablet; Refill: 0 - CMP14+EGFR - CBC with Differential/Platelet  10. Aortic atherosclerosis (HCC) - CMP14+EGFR - CBC with Differential/Platelet  11. Palliative care patient - CMP14+EGFR - CBC with Differential/Platelet  12. Chronic respiratory failure with hypoxia (HCC) - PR CANNULA NASAL - CMP14+EGFR - CBC with Differential/Platelet   Labs pending Health Maintenance reviewed Diet and exercise encouraged  Follow up plan: 6 months    Evelina Dun, FNP

## 2022-09-07 LAB — CBC WITH DIFFERENTIAL/PLATELET
Basophils Absolute: 0.1 10*3/uL (ref 0.0–0.2)
Basos: 1 %
EOS (ABSOLUTE): 0.2 10*3/uL (ref 0.0–0.4)
Eos: 2 %
Hematocrit: 39.5 % (ref 34.0–46.6)
Hemoglobin: 12.9 g/dL (ref 11.1–15.9)
Immature Grans (Abs): 0 10*3/uL (ref 0.0–0.1)
Immature Granulocytes: 0 %
Lymphocytes Absolute: 1.3 10*3/uL (ref 0.7–3.1)
Lymphs: 16 %
MCH: 30.5 pg (ref 26.6–33.0)
MCHC: 32.7 g/dL (ref 31.5–35.7)
MCV: 93 fL (ref 79–97)
Monocytes Absolute: 0.5 10*3/uL (ref 0.1–0.9)
Monocytes: 6 %
Neutrophils Absolute: 5.9 10*3/uL (ref 1.4–7.0)
Neutrophils: 75 %
Platelets: 189 10*3/uL (ref 150–450)
RBC: 4.23 x10E6/uL (ref 3.77–5.28)
RDW: 15 % (ref 11.7–15.4)
WBC: 7.9 10*3/uL (ref 3.4–10.8)

## 2022-09-07 LAB — CMP14+EGFR
ALT: 18 IU/L (ref 0–32)
AST: 23 IU/L (ref 0–40)
Albumin/Globulin Ratio: 2.2 (ref 1.2–2.2)
Albumin: 3.8 g/dL (ref 3.8–4.8)
Alkaline Phosphatase: 85 IU/L (ref 44–121)
BUN/Creatinine Ratio: 18 (ref 12–28)
BUN: 27 mg/dL (ref 8–27)
Bilirubin Total: 0.4 mg/dL (ref 0.0–1.2)
CO2: 23 mmol/L (ref 20–29)
Calcium: 9.1 mg/dL (ref 8.7–10.3)
Chloride: 106 mmol/L (ref 96–106)
Creatinine, Ser: 1.48 mg/dL — ABNORMAL HIGH (ref 0.57–1.00)
Globulin, Total: 1.7 g/dL (ref 1.5–4.5)
Glucose: 120 mg/dL — ABNORMAL HIGH (ref 70–99)
Potassium: 4.8 mmol/L (ref 3.5–5.2)
Sodium: 144 mmol/L (ref 134–144)
Total Protein: 5.5 g/dL — ABNORMAL LOW (ref 6.0–8.5)
eGFR: 36 mL/min/{1.73_m2} — ABNORMAL LOW (ref >59)

## 2022-09-12 ENCOUNTER — Emergency Department (HOSPITAL_COMMUNITY)
Admission: EM | Admit: 2022-09-12 | Discharge: 2022-09-12 | Disposition: A | Discharge status: Home / Self Care | Payer: 59 | Attending: Emergency Medicine | Admitting: Emergency Medicine

## 2022-09-12 ENCOUNTER — Emergency Department (HOSPITAL_COMMUNITY): Payer: 59

## 2022-09-12 ENCOUNTER — Telehealth: Payer: Self-pay | Admitting: *Deleted

## 2022-09-12 ENCOUNTER — Encounter (HOSPITAL_COMMUNITY): Payer: Self-pay | Admitting: Emergency Medicine

## 2022-09-12 DIAGNOSIS — I4891 Unspecified atrial fibrillation: Secondary | ICD-10-CM | POA: Diagnosis not present

## 2022-09-12 DIAGNOSIS — I130 Hypertensive heart and chronic kidney disease with heart failure and stage 1 through stage 4 chronic kidney disease, or unspecified chronic kidney disease: Secondary | ICD-10-CM | POA: Diagnosis not present

## 2022-09-12 DIAGNOSIS — N1831 Chronic kidney disease, stage 3a: Secondary | ICD-10-CM | POA: Insufficient documentation

## 2022-09-12 DIAGNOSIS — R4182 Altered mental status, unspecified: Secondary | ICD-10-CM | POA: Insufficient documentation

## 2022-09-12 DIAGNOSIS — I509 Heart failure, unspecified: Secondary | ICD-10-CM | POA: Diagnosis not present

## 2022-09-12 DIAGNOSIS — Z8541 Personal history of malignant neoplasm of cervix uteri: Secondary | ICD-10-CM | POA: Insufficient documentation

## 2022-09-12 DIAGNOSIS — J449 Chronic obstructive pulmonary disease, unspecified: Secondary | ICD-10-CM | POA: Insufficient documentation

## 2022-09-12 DIAGNOSIS — Z7951 Long term (current) use of inhaled steroids: Secondary | ICD-10-CM | POA: Insufficient documentation

## 2022-09-12 DIAGNOSIS — Z79899 Other long term (current) drug therapy: Secondary | ICD-10-CM | POA: Diagnosis not present

## 2022-09-12 DIAGNOSIS — Z7901 Long term (current) use of anticoagulants: Secondary | ICD-10-CM | POA: Diagnosis not present

## 2022-09-12 LAB — COMPREHENSIVE METABOLIC PANEL
ALT: 22 U/L (ref 0–44)
AST: 26 U/L (ref 15–41)
Albumin: 3.5 g/dL (ref 3.5–5.0)
Alkaline Phosphatase: 70 U/L (ref 38–126)
Anion gap: 10 (ref 5–15)
BUN: 28 mg/dL — ABNORMAL HIGH (ref 8–23)
CO2: 28 mmol/L (ref 22–32)
Calcium: 8.8 mg/dL — ABNORMAL LOW (ref 8.9–10.3)
Chloride: 104 mmol/L (ref 98–111)
Creatinine, Ser: 1.39 mg/dL — ABNORMAL HIGH (ref 0.44–1.00)
GFR, Estimated: 38 mL/min — ABNORMAL LOW (ref >60)
Glucose, Bld: 150 mg/dL — ABNORMAL HIGH (ref 70–99)
Potassium: 4.3 mmol/L (ref 3.5–5.1)
Sodium: 142 mmol/L (ref 135–145)
Total Bilirubin: 0.9 mg/dL (ref 0.3–1.2)
Total Protein: 6.4 g/dL — ABNORMAL LOW (ref 6.5–8.1)

## 2022-09-12 LAB — CBC
HCT: 39.4 % (ref 36.0–46.0)
Hemoglobin: 12.2 g/dL (ref 12.0–15.0)
MCH: 30.5 pg (ref 26.0–34.0)
MCHC: 31 g/dL (ref 30.0–36.0)
MCV: 98.5 fL (ref 80.0–100.0)
Platelets: 192 10*3/uL (ref 150–400)
RBC: 4 MIL/uL (ref 3.87–5.11)
RDW: 15.1 % (ref 11.5–15.5)
WBC: 9.2 10*3/uL (ref 4.0–10.5)
nRBC: 0 % (ref 0.0–0.2)

## 2022-09-12 LAB — DIFFERENTIAL
Abs Immature Granulocytes: 0.02 10*3/uL (ref 0.00–0.07)
Basophils Absolute: 0.1 10*3/uL (ref 0.0–0.1)
Basophils Relative: 1 %
Eosinophils Absolute: 0.2 10*3/uL (ref 0.0–0.5)
Eosinophils Relative: 2 %
Immature Granulocytes: 0 %
Lymphocytes Relative: 15 %
Lymphs Abs: 1.4 10*3/uL (ref 0.7–4.0)
Monocytes Absolute: 0.7 10*3/uL (ref 0.1–1.0)
Monocytes Relative: 8 %
Neutro Abs: 6.9 10*3/uL (ref 1.7–7.7)
Neutrophils Relative %: 74 %

## 2022-09-12 LAB — TROPONIN I (HIGH SENSITIVITY): Troponin I (High Sensitivity): 8 ng/L (ref <18)

## 2022-09-12 LAB — PROTIME-INR
INR: 2 — ABNORMAL HIGH (ref 0.8–1.2)
Prothrombin Time: 22.3 seconds — ABNORMAL HIGH (ref 11.4–15.2)

## 2022-09-12 LAB — APTT: aPTT: 30 seconds (ref 24–36)

## 2022-09-12 LAB — ETHANOL: Alcohol, Ethyl (B): <10 mg/dL (ref <10)

## 2022-09-12 LAB — BRAIN NATRIURETIC PEPTIDE: B Natriuretic Peptide: 317 pg/mL — ABNORMAL HIGH (ref 0.0–100.0)

## 2022-09-12 MED ORDER — IOHEXOL 350 MG/ML SOLN
60.0000 mL | Freq: Once | INTRAVENOUS | Status: AC | PRN
  Administered 2022-09-12: 60 mL via INTRAVENOUS

## 2022-09-12 NOTE — ED Notes (Signed)
Pt care taken, is still searching for appropriate words. Alert and talking with family.

## 2022-09-12 NOTE — Telephone Encounter (Signed)
Description   INR    2.0 Goal 2-3) Continue 5 mg daily Recheck in 1 to 2 weeks

## 2022-09-12 NOTE — ED Notes (Signed)
Pt states they are ready to go home and tired of waiting for tests and results. EDPA made aware

## 2022-09-12 NOTE — ED Provider Notes (Signed)
[facility] Provider Note   CSN: [id number] Arrival date & time: 09/12/22  1150     History  Chief Complaint  Patient presents with   Altered Mental Status    Melinda Collins is a 82 y.o. female with a past medical history of aortic stenosis, COPD on 2 L of oxygen, last echo LVEF 60-65%,mitral regurgitation, tricuspid regurgitation, urine cancer presenting today for evaluation of altered mental status. Patient's daughter reports patient woke up with a bad headache yesterday morning.  Last night speech started to jumble.  Last night family at her house and patient started to use her words for things and talks a lot which is unusual for her.  Patient has history of CHF, COPD on 2 L of oxygen.  Denies fever, chest pain, nausea, vomiting, bowel changes, urinary symptoms.  Denies any recent fall.  Gait is as baseline.  She uses a walker at home.   Altered Mental Status   Past Medical History:  Diagnosis Date   Aortic insufficiency    Aortic stenosis    Arthritis    Chronic hypoxemic respiratory failure (HCC)    Chronic kidney disease, stage 3a (HCC)    COPD (chronic obstructive pulmonary disease) (HCC)    Depression    HOH (hard of hearing)    Mitral regurgitation    Mixed hyperlipidemia    Permanent atrial fibrillation (HCC)    Pulmonary hypertension (HCC)    Ruptured disk    Tricuspid regurgitation    Uterine cancer [facility])    Past Surgical History:  Procedure Laterality Date   ABDOMINAL HYSTERECTOMY     total   CATARACT EXTRACTION W/PHACO Left 08/25/2014   Procedure: CATARACT EXTRACTION PHACO AND INTRAOCULAR LENS PLACEMENT (IOC);  Surgeon: Tonny Branch, MD;  Location: [facility];  Service: Ophthalmology;  Laterality: Left;  CDE 6.95   CATARACT EXTRACTION W/PHACO Right 09/11/2014   Procedure: CATARACT EXTRACTION PHACO AND INTRAOCULAR LENS PLACEMENT (IOC);  Surgeon: Tonny Branch, MD;  Location: [facility];  Service: Ophthalmology;  Laterality:  Right;  CDE:8.11   CERVICAL DISC SURGERY     x2   KYPHOPLASTY     SPINE SURGERY  2013   cervical fusion     Home Medications Prior to Admission medications   Medication Sig Start Date End Date Taking? Authorizing Provider  acetaminophen (TYLENOL) 325 MG tablet Take 650 mg by mouth every 6 (six) hours as needed for moderate pain.    [provider]  acetaminophen-codeine (TYLENOL #2) 300-15 MG tablet SMARTSIG:0.5 Tablet(s) By Mouth Every 4-6 Hours PRN 08/12/22   [provider]  albuterol (VENTOLIN HFA) 108 (90 Base) MCG/ACT inhaler INHALE 1-2 PUFFS INTO THE LUNGS EVERY 4 TO 6 HOURS AS NEEDED FOR SHORTNESS OF BREATH 09/06/22   [name], FNP  ALPRAZolam Duanne Moron) 0.25 MG tablet Take 1 tablet (0.25 mg total) by mouth at bedtime as needed for anxiety. 09/06/22   [name], FNP  atorvastatin (LIPITOR) 80 MG tablet TAKE ONE TABLET BY MOUTH DAILY AT 6 PM (NEEDS TO BE SEEN BEFORE NEXT REFILL) 09/06/22   [name], FNP  bismuth subsalicylate (PEPTO BISMOL) 262 MG/15ML suspension Take 30 mLs by mouth every 6 (six) hours as needed.    [provider]  cyanocobalamin (VITAMIN B12) 1000 MCG tablet TAKE ONE TABLET BY MOUTH DAILY WITH BREAKFAST 07/01/22   Hawks, Christy A, FNP  diclofenac sodium (VOLTAREN) 1 % GEL Apply 2 g topically 4 (four) times  daily. 05/20/19   Baruch Gouty, FNP  diltiazem (CARDIZEM CD) 180 MG 24 hr capsule TAKE ONE CAPSULE BY MOUTH DAILY (,,EVENING) 09/06/22   [name], FNP  DULoxetine (CYMBALTA) 60 MG capsule Take 2 capsules (120 mg total) by mouth daily. (Per Daughter 1 AM and ! PM) (NEEDS TO BE SEEN BEFORE NEXT REFILL) 09/06/22   [name], FNP  esomeprazole (NEXIUM) 40 MG capsule TAKE ONE CAPSULE BY MOUTH ONCE DAILY (MORNING PER PT.) (NEEDS TO BE SEEN BEFORE NEXT REFILL) 09/06/22   [name], FNP  fluticasone (FLONASE) 50 MCG/ACT nasal spray USE ONE SPRAY IN EACH NOSTRIL ONCE DAILY. SHAKE GENTLY BEFORE USING. 09/06/22    [name], FNP  Fluticasone-Umeclidin-Vilant (TRELEGY ELLIPTA) 100-62.5-25 MCG/ACT AEPB INHALE ONE PUFF INTO THE LUNGS ONCE DAILY 09/06/22   [name], FNP  guaiFENesin (MUCINEX) 600 MG 12 hr tablet Take 1 tablet (600 mg total) by mouth 2 (two) times daily as needed. 08/10/16   [name], MD  ketoconazole (NIZORAL) 2 % cream Apply topically daily. 01/19/22   [provider]  levalbuterol (XOPENEX) 0.63 MG/3ML nebulizer solution INHALE CONTENTS OF 1 VIAL IN NEBULIZER EVERY 6 HOURS AS NEEDED FOR WHEEZING OR SHORTNESS OF BREATH 09/06/22   [name], FNP  loratadine (ALLERGY RELIEF) 10 MG tablet TAKE ONE TABLET BY MOUTH DAILY (,EVENING) 07/01/22   Hawks, Christy A, FNP  Magnesium Hydroxide (DULCOLAX PO) Take 25 mg by mouth daily.    [provider]  metoprolol succinate (TOPROL-XL) 50 MG 24 hr tablet Take with or immediately following a meal. 09/06/22   Hawks, Christy A, FNP  NON FORMULARY Inhale 2-4 L into the lungs daily. oxygen    [provider]  nystatin (MYCOSTATIN/NYSTOP) powder Apply 1 Application topically 3 (three) times daily. 09/06/22   [name], FNP  nystatin cream (MYCOSTATIN) APPLY TOPICALLY TWICE DAILY 04/12/22   [name], FNP  Spacer/Aero-Holding Josiah Lobo DEVI 1 Device by Does not apply route in the morning and at bedtime. 04/13/21   [name], FNP  torsemide (DEMADEX) 20 MG tablet Take 1 tablet (20 mg total) by mouth daily. 09/06/22   [name], FNP  Vitamin D, Ergocalciferol, 50000 units CAPS Take 50,000 Units by mouth every 7 (seven) days. Mondays 09/06/22   [name], FNP  warfarin (COUMADIN) 5 MG tablet TAKE 1 OR 2 TABLETS BY MOUTH AS DIRECTED BY WARFARIN CLINIC (T1,TABLET DAILY EXCEPT ON TUESDAY TAKE 1 AND 1/2 TABLETS) 09/06/22   [name], FNP      Allergies    Baclofen, Niaspan [niacin], and Flexeril [cyclobenzaprine]    Review of Systems   Review of Systems  Physical Exam Updated Vital  Signs BP (!) 147/106   Pulse 89   Temp 98 F (36.7 C)   Resp 20   SpO2 99%  Physical Exam  ED Results / Procedures / Treatments   Labs (all labs ordered are listed, but only abnormal results are displayed) Labs Reviewed  PROTIME-INR - Abnormal; Notable for the following components:      Result Value   Prothrombin Time 22.3 (*)    INR 2.0 (*)    All other components within normal limits  COMPREHENSIVE METABOLIC PANEL - Abnormal; Notable for the following components:   Glucose, Bld 150 (*)    BUN 28 (*)    Creatinine, Ser 1.39 (*)    Calcium 8.8 (*)    Total Protein 6.4 (*)  GFR, Estimated 38 (*)    All other components within normal limits  BRAIN NATRIURETIC PEPTIDE - Abnormal; Notable for the following components:   B Natriuretic Peptide 317.0 (*)    All other components within normal limits  APTT  CBC  DIFFERENTIAL  ETHANOL  CBG MONITORING, ED  TROPONIN I (HIGH SENSITIVITY)    EKG EKG Interpretation  Date/Time:  Monday September 12 2022 12:13:50 EST Ventricular Rate:  91 PR Interval:    QRS Duration: 112 QT Interval:  380 QTC Calculation: 467 R Axis:   -73 Text Interpretation: Atrial fibrillation Left axis deviation Inferior-posterior infarct , age undetermined Anterior infarct , age undetermined ST & T wave abnormality, consider lateral ischemia Abnormal ECG When compared with ECG of 09-Jun-2021 16:33, PREVIOUS ECG IS PRESENT no significant changes Confirmed by Noemi Chapel [id number]) on 09/12/2022 7:00:19 PM  Radiology CT ANGIO HEAD NECK W WO CM  Result Date: 09/12/2022 CLINICAL DATA:  Provided history: Altered mental status.  Headache. EXAM: CT ANGIOGRAPHY HEAD AND NECK TECHNIQUE: Multidetector CT imaging of the head and neck was performed using the standard protocol during bolus administration of intravenous contrast. Multiplanar CT image reconstructions and MIPs were obtained to evaluate the vascular anatomy. Carotid stenosis measurements (when applicable) are  obtained utilizing NASCET criteria, using the distal internal carotid diameter as the denominator. RADIATION DOSE REDUCTION: This exam was performed according to the departmental dose-optimization program which includes automated exposure control, adjustment of the mA and/or kV according to patient size and/or use of iterative reconstruction technique. CONTRAST:  56m OMNIPAQUE IOHEXOL 350 MG/ML SOLN COMPARISON:  Noncontrast head CT and brain MRI examinations performed earlier today 09/12/2022. FINDINGS: CTA NECK FINDINGS Aortic arch: Standard aortic branching. Atherosclerotic plaque within the visualized aortic arch and proximal major branch vessels neck. No hemodynamically significant innominate or proximal subclavian artery stenosis. Right carotid system: CCA and ICA patent within the neck without stenosis. Mild atherosclerotic plaque at the CCA origin, within the distal CCA and about the carotid bifurcation. Partially retropharyngeal course of the cervical ICA. Left carotid system: CCA and ICA patent within the neck without stenosis. Atherosclerotic plaque scattered within the CCA, about the carotid bifurcation and within the proximal ICA. Partially retropharyngeal course of the cervical ICA. Vertebral arteries: Vertebral arteries codominant and patent within the neck. Nonstenotic atherosclerotic plaque at the origins of both vessels. Skeleton: Cervical spondylosis. Osseous fusion across the C5-C6 disc space. No acute fracture or aggressive osseous lesion. Other neck: No neck mass or cervical lymphadenopathy. Upper chest: No consolidation within the imaged lung apices. Emphysema. Review of the MIP images confirms the above findings CTA HEAD FINDINGS Anterior circulation: The intracranial internal carotid arteries are patent. Atherosclerotic plaque within both vessels with no more than mild stenosis The M1 middle cerebral arteries are patent. Atherosclerotic irregularity of the M2 and more distal MCA vessels,  bilaterally. Most notably, there are severe stenoses within multiple distal M2 right MCA vessels (for instance as seen on series 12, image 13). The anterior cerebral arteries are patent. Atherosclerotic irregularity of both vessels. Most notably, there is a moderate/severe stenosis within the left anterior cerebral artery at the A3/A4 junction (series 12, image 20). No intracranial aneurysm is identified. Posterior circulation: The intracranial vertebral arteries are patent. Nonstenotic atherosclerotic plaque within both vessels. The basilar artery is patent. The posterior cerebral arteries are patent. Posterior communicating artery is present. The left posterior communicating artery is diminutive or absent. Venous sinuses: Within the limitations of contrast timing, no convincing thrombus. Anatomic variants:  As described. Review of the MIP images confirms the above findings IMPRESSION: CTA neck: 1. The common carotid and internal carotid arteries are patent within the neck without stenosis. Atherosclerotic plaque bilaterally, as described. 2. Vertebral arteries codominant and patent within the neck without stenosis. Non-stenotic atherosclerotic plaque at the origins of both vessels. 3.  Aortic Atherosclerosis (ICD10-I70.0). CTA head: 1. No intracranial large vessel occlusion is identified. 2. Intracranial atherosclerotic disease, most notably as follows. Severe stenoses within multiple distal M2 right MCA vessels. Moderate/severe stenosis within the left anterior cerebral artery at the A3/A4 junction. Electronically Signed   By: Kellie Simmering D.O.   On: 09/12/2022 18:46   MR BRAIN WO CONTRAST  Result Date: 09/12/2022 CLINICAL DATA:  Neuro deficit, acute, stroke suspected EXAM: MRI HEAD WITHOUT CONTRAST TECHNIQUE: Multiplanar, multiecho pulse sequences of the brain and surrounding structures were obtained without intravenous contrast. COMPARISON:  CT head from the same day. FINDINGS: Brain: No acute infarction,  hemorrhage, hydrocephalus, extra-axial collection or mass lesion. Vascular: Major arterial flow voids are maintained. Skull and upper cervical spine: Normal marrow signal. Sinuses/Orbits: Clear sinuses.  No acute orbital findings. Other: No mastoid effusions. IMPRESSION: No evidence of acute abnormality. Electronically Signed   By: Margaretha Sheffield M.D.   On: 09/12/2022 15:31   CT HEAD WO CONTRAST  Result Date: 09/12/2022 CLINICAL DATA:  Neuro deficit headache EXAM: CT HEAD WITHOUT CONTRAST TECHNIQUE: Contiguous axial images were obtained from the base of the skull through the vertex without intravenous contrast. RADIATION DOSE REDUCTION: This exam was performed according to the departmental dose-optimization program which includes automated exposure control, adjustment of the mA and/or kV according to patient size and/or use of iterative reconstruction technique. COMPARISON:  06/11/2021 FINDINGS: Brain: No evidence of acute infarction, hemorrhage, hydrocephalus, extra-axial collection or mass lesion/mass effect. Periventricular white matter hypodensity. Vascular: No hyperdense vessel or unexpected calcification. Skull: Normal. Negative for fracture or focal lesion. Sinuses/Orbits: No acute finding. Other: None. IMPRESSION: No acute intracranial pathology. Small-vessel white matter disease. Electronically Signed   By: Delanna Ahmadi M.D.   On: 09/12/2022 12:45    Procedures Procedures    Medications Ordered in ED Medications  iohexol (OMNIPAQUE) 350 MG/ML injection 60 mL (60 mLs Intravenous Contrast Given 09/12/22 1801)    ED Course/ Medical Decision Making/ A&P                             Medical Decision Making Amount and/or Complexity of Data Reviewed Labs: ordered. Radiology: ordered.  Risk Prescription drug management.   This patient presents to the ED for altered mental status, this involves an extensive number of treatment options, and is a complaint that carries with a high risk of  complications and morbidity.  The differential diagnosis includes alcohol intoxication, alcohol withdrawal, electrolyte abnormality, encephalopathy, hypoglycemia, hypoglycemia, DKA/HHS, opiates, uremia, trauma, toxins, tumor, thyroid toxicosis, infection, polypharmacy, psychiatric, seizure, stroke.  This is not an exhaustive list.  Lab tests: I ordered and personally interpreted labs.  The pertinent results include: WBC unremarkable. Hbg unremarkable. Platelets unremarkable. Electrolytes unremarkable. BUN, creatinine baseline.  PT 22.3.  INR 2.0.  BNP 317.  Imaging studies: I ordered imaging studies. I personally reviewed, interpreted imaging and agree with the radiologist's interpretations. The results include: CT head, MRI brain, CT angio head neck negative.  Problem list/ ED course/ Critical interventions/ Medical management: HPI: See above Vital signs within normal range and stable throughout visit. Laboratory/imaging studies significant for: See  above. On physical examination, patient is afebrile and appears in no acute distress. This patient presents with altered mental status, concerning for dementia. Labs and exam were inconsistent with toxic metabolic etiologies such as electrolyte disturbances (Na/Ca), hypoglycemia, and uremia; acidosis states, infection (i.e. Sepsis). History and exam make toxidromes of intoxication or withdrawal, hypoxemia or hypercarbia, liver disease or failure causing hepatic encephalopathy, endocrine emergencies (hyper/hypothyroidism, adrenal insufficiency), seizure, trauma, intracranial bleeds or ischemic stroke less likely. I have reviewed the patient home medicines and have made adjustments as needed.  Cardiac monitoring/EKG: The patient was maintained on a cardiac monitor.  I personally reviewed and interpreted the cardiac monitor which showed an underlying rhythm of: sinus rhythm.  Additional history obtained: External records from outside source obtained and  reviewed including: Chart review including previous notes, labs, imaging.  Consultations obtained: I requested consultation with Dr. Leonel Ramsay, and discussed lab and imaging findings as well as pertinent plan.  He recommended outpatient follow-up with neurology.  Disposition Continued outpatient therapy. Follow-up with PCP and neurology recommended for reevaluation of symptoms. Treatment plan discussed with patient.  Pt acknowledged understanding was agreeable to the plan. Worrisome signs and symptoms were discussed with patient, and patient acknowledged understanding to return to the ED if they noticed these signs and symptoms. Patient was stable upon discharge.   This chart was dictated using voice recognition software.  Despite best efforts to proofread,  errors can occur which can change the documentation meaning.          Final Clinical Impression(s) / ED Diagnoses Final diagnoses:  Altered mental status, unspecified altered mental status type    Rx / DC Orders ED Discharge Orders     None         Rex Kras, [place] 09/12/22 2328    Milton Ferguson, MD 09/13/22 1352

## 2022-09-12 NOTE — ED Notes (Signed)
Pt's family member removed pt's IV. IV site clean and dry.

## 2022-09-12 NOTE — Telephone Encounter (Signed)
Pt aware of INR and recommendations and voiced understanding.

## 2022-09-12 NOTE — ED Provider Triage Note (Signed)
Emergency Medicine Provider Triage Evaluation Note  Melinda Collins , a 82 y.o. female  was evaluated in triage.  Pt complains of headache and change in speech.  Patient's daughter reports patient woke up with a bad headache yesterday morning.  Last night speech started to jumble.  Last night family at her house and patient started to use her words for things and talks a lot which is unusual for her.  Patient has history of CHF, COPD on 2 L of oxygen.  Denies fever, chest pain, nausea, vomiting, bowel changes, urinary symptoms.  Denies any recent fall.  Gait is as baseline.  Review of Systems  Positive: As above Negative: As above  Physical Exam  BP (!) 135/118   Pulse 86   Temp 99.7 F (37.6 C)   Resp (!) 22   SpO2 95%  Gen:   Awake, no distress   Resp:  Normal effort  MSK:   Moves extremities without difficulty  Other:  Neuroexam unremarkable.  Medical Decision Making  Medically screening exam initiated at 1:11 PM.  Appropriate orders placed.  [name] G Casanas was informed that the remainder of the evaluation will be completed by another provider, this initial triage assessment does not replace that evaluation, and the importance of remaining in the ED until their evaluation is complete.     Rex Kras, [place] 09/12/22 2333

## 2022-09-12 NOTE — Discharge Instructions (Signed)
Your workups were reassuring today.  I recommend close follow-up with PCP and neurology for reevaluation.  Please do not hesitate to return to emergency department if worrisome signs symptoms we discussed become apparent.

## 2022-09-12 NOTE — ED Notes (Signed)
Gone to MRI

## 2022-09-12 NOTE — ED Notes (Signed)
ED PA at bedside

## 2022-09-12 NOTE — Telephone Encounter (Signed)
CALL PT [phone] I see that pt is currently at the ED  Fax received at 1215 pm, pulled off fax at 1:15 pm mdINR PT/INR self testing service Test date/time 09/11/22 1212 pm INR 2.0  Second fax came in Pt also tested on 09/04/22 1212 pm INR 2.3

## 2022-09-12 NOTE — ED Triage Notes (Addendum)
PT woke up with headache yesterday morning. Last night speech was jumbled but refused to go to hospital. This morning, speech still jumbled and dtr in law insisted she come. Takes blood thinner. Denies fall. Grip strength equal. Pt is alert and oriented to self and situation and time. She is little confused about where she lives. Last night family at her house and she was using wrong word for things and talking a lot, which family reports is unusual for her. Gait is at baseline with walker per family.

## 2022-09-13 ENCOUNTER — Telehealth: Payer: Self-pay

## 2022-09-13 NOTE — Transitions of Care (Post Inpatient/ED Visit) (Signed)
   09/13/2022  Name: Melinda Collins MRN: [id number] DOB: Apr 26, 1941  Today's TOC FU Call Status: Today's TOC FU Call Status:: Unsuccessul Call (1st Attempt) Unsuccessful Call (1st Attempt) Date: 09/13/22  Attempted to reach the patient regarding the most recent Inpatient/ED visit.  Follow Up Plan: Additional outreach attempts will be made to reach the patient to complete the Transitions of Care (Post Inpatient/ED visit) call.   Signature Juanda Crumble, [place] Direct Dial [phone]

## 2022-09-14 NOTE — Transitions of Care (Post Inpatient/ED Visit) (Signed)
   09/14/2022  Name: Melinda Collins MRN: [id number] DOB: 11-Nov-1940  Today's TOC FU Call Status: Today's TOC FU Call Status:: Unsuccessul Call (1st Attempt) Unsuccessful Call (1st Attempt) Date: 09/13/22 Unsuccessful Call (2nd Attempt) Date: 09/14/22  Attempted to reach the patient regarding the most recent Inpatient/ED visit.  Follow Up Plan: No further outreach attempts will be made at this time. We have been unable to contact the patient.  Signature  Juanda Crumble, [place] Direct Dial [phone]

## 2022-09-21 ENCOUNTER — Telehealth: Payer: Self-pay | Admitting: *Deleted

## 2022-09-21 NOTE — Telephone Encounter (Signed)
Left message making pt aware and to call back if needed.

## 2022-09-21 NOTE — Telephone Encounter (Signed)
Description   INR    2.9 Goal 2-3) Continue 5 mg daily Recheck in 1 to 2 weeks    Caryl Pina, MD [facility] 09/21/2022, 12:56 PM

## 2022-09-21 NOTE — Telephone Encounter (Signed)
Fax received mdINR PT/INR self testing service Test date/time 09/18/22 832 am INR 2.9

## 2022-09-28 ENCOUNTER — Telehealth: Payer: Self-pay | Admitting: Family

## 2022-09-28 NOTE — Telephone Encounter (Signed)
CALLED PATIENT, NO ANSWER, LEFT MESSAGE TO RETURN CALL 

## 2022-09-28 NOTE — Telephone Encounter (Signed)
Fax received mdINR PT/INR self testing service Test date/time 09/25/22 9:01am INR 2.4

## 2022-10-07 ENCOUNTER — Other Ambulatory Visit: Payer: Self-pay | Admitting: Family

## 2022-10-07 DIAGNOSIS — M4716 Other spondylosis with myelopathy, lumbar region: Secondary | ICD-10-CM

## 2022-10-07 DIAGNOSIS — M545 Low back pain, unspecified: Secondary | ICD-10-CM

## 2022-10-07 DIAGNOSIS — F339 Major depressive disorder, recurrent, unspecified: Secondary | ICD-10-CM

## 2022-10-07 DIAGNOSIS — J439 Emphysema, unspecified: Secondary | ICD-10-CM

## 2022-10-07 DIAGNOSIS — E782 Mixed hyperlipidemia: Secondary | ICD-10-CM

## 2022-10-07 NOTE — Telephone Encounter (Signed)
Hawks NTBS 30 days given 09/06/22

## 2022-10-10 ENCOUNTER — Encounter: Payer: Self-pay | Admitting: Family

## 2022-10-10 ENCOUNTER — Other Ambulatory Visit: Payer: Self-pay | Admitting: Family

## 2022-10-10 ENCOUNTER — Other Ambulatory Visit: Payer: Self-pay | Admitting: Rehabilitation

## 2022-10-10 DIAGNOSIS — S22080A Wedge compression fracture of T11-T12 vertebra, initial encounter for closed fracture: Secondary | ICD-10-CM

## 2022-10-10 DIAGNOSIS — F339 Major depressive disorder, recurrent, unspecified: Secondary | ICD-10-CM

## 2022-10-10 DIAGNOSIS — E782 Mixed hyperlipidemia: Secondary | ICD-10-CM

## 2022-10-10 DIAGNOSIS — M545 Low back pain, unspecified: Secondary | ICD-10-CM

## 2022-10-10 DIAGNOSIS — M4716 Other spondylosis with myelopathy, lumbar region: Secondary | ICD-10-CM

## 2022-10-10 DIAGNOSIS — J439 Emphysema, unspecified: Secondary | ICD-10-CM

## 2022-10-10 NOTE — Telephone Encounter (Signed)
LMTCB to schedule appt Letter mailed 

## 2022-10-11 ENCOUNTER — Other Ambulatory Visit: Payer: Self-pay | Admitting: Family

## 2022-10-18 ENCOUNTER — Telehealth: Payer: Self-pay | Admitting: *Deleted

## 2022-10-18 NOTE — Telephone Encounter (Signed)
Description   INR  2.2 Goal 2-3) Continue 5 mg daily Recheck in 1 to 2 weeks

## 2022-10-18 NOTE — Telephone Encounter (Signed)
Fax received mdINR PT/INR self testing service Test date/time 10/09/22 1058 am INR 2.3  Results for 10/02/22 also faxed, results 2.2  Per chart pt is currently Admitted to [facility]

## 2022-10-18 NOTE — Telephone Encounter (Signed)
Fax received mdINR PT/INR self testing service Test date/time 10/16/22 126 pm INR 3.1  Pt is currently admitted at [facility]

## 2022-10-20 ENCOUNTER — Telehealth: Payer: Self-pay

## 2022-10-20 ENCOUNTER — Other Ambulatory Visit: Payer: Self-pay | Admitting: Family

## 2022-10-20 DIAGNOSIS — B372 Candidiasis of skin and nail: Secondary | ICD-10-CM

## 2022-10-20 NOTE — Transitions of Care (Post Inpatient/ED Visit) (Signed)
   10/20/2022  Name: Melinda Collins MRN: [id number] DOB: 12-26-1940  Today's TOC FU Call Status: Today's TOC FU Call Status:: Successful TOC FU Call Competed TOC FU Call Complete Date: 10/20/22  Transition Care Management Follow-up Telephone Call Date of Discharge: 10/19/22 [place]charge Facility[facility] Name of [facility] ([place] Discharge Facility: [facility] Type of Discharge: Inpatient Admission Primary Inpatient Discharge Diagnosis:: altered mental status How have you been since you were released from the hospital?: Better Any questions or concerns?: No  Items Reviewed: Did you receive and understand the discharge instructions provided?: Yes Medications obtained and verified?: Yes (Medications Reviewed) Any new allergies since your discharge?: No Dietary orders reviewed?: Yes Do you have support at home?: Yes People in Home: other relative(s)  Home Care and Equipment/Supplies: [place] Ordered?: NA Any new equipment or medical supplies ordered?: NA  Functional Questionnaire: Do you need assistance with bathing/showering or dressing?: No Do you need assistance with meal preparation?: No Do you need assistance with eating?: No Do you have difficulty maintaining continence: No Do you need assistance with getting out of bed/getting out of a chair/moving?: No Do you have difficulty managing or taking your medications?: No  Follow up appointments reviewed: PCP Follow-up appointment confirmed?: Yes Date of PCP follow-up appointment?: 10/27/22 Follow-up Provider: [facility] Follow-up appointment confirmed?: NA Do you need transportation to your follow-up appointment?: No Do you understand care options if your condition(s) worsen?: Yes-patient verbalized understanding    [place], [place] Direct Dial [phone]

## 2022-10-24 ENCOUNTER — Telehealth: Payer: Self-pay | Admitting: Family

## 2022-10-24 MED ORDER — NYSTATIN 100000 UNIT/ML MT SUSP: 5.0000 mL | Freq: Four times a day (QID) | OROMUCOSAL | 0 refills | Status: DC

## 2022-10-24 NOTE — Telephone Encounter (Signed)
Nystatin  Prescription sent to pharmacy   

## 2022-10-24 NOTE — Telephone Encounter (Signed)
  Incoming Patient Call  10/24/2022  What symptoms do you have? Thrush in mouth. Patient has HFU on 4-11.  How long have you been sick? Four days  Have you been seen for this problem? No   If your provider decides to give you a prescription, which pharmacy would you like for it to be sent to? [facility].   Patient informed that this information will be sent to the clinical staff for review and that they should receive a follow up call.

## 2022-10-24 NOTE — Telephone Encounter (Signed)
Aware. 

## 2022-10-26 ENCOUNTER — Encounter: Payer: Self-pay | Admitting: *Deleted

## 2022-10-27 ENCOUNTER — Ambulatory Visit (INDEPENDENT_AMBULATORY_CARE_PROVIDER_SITE_OTHER): Payer: 59 | Admitting: Family

## 2022-10-27 ENCOUNTER — Encounter: Payer: Self-pay | Admitting: Family

## 2022-10-27 VITALS — BP 128/85 | HR 75 | Temp 97.5°F | Ht 63.0 in | Wt 184.0 lb

## 2022-10-27 DIAGNOSIS — B370 Candidal stomatitis: Secondary | ICD-10-CM | POA: Diagnosis not present

## 2022-10-27 DIAGNOSIS — Z09 Encounter for follow-up examination after completed treatment for conditions other than malignant neoplasm: Secondary | ICD-10-CM | POA: Diagnosis not present

## 2022-10-27 DIAGNOSIS — J189 Pneumonia, unspecified organism: Secondary | ICD-10-CM | POA: Diagnosis not present

## 2022-10-27 DIAGNOSIS — R4182 Altered mental status, unspecified: Secondary | ICD-10-CM | POA: Diagnosis not present

## 2022-10-27 MED ORDER — MAGIC MOUTHWASH W/LIDOCAINE
10.0000 mL | Freq: Three times a day (TID) | ORAL | 0 refills | Status: DC | PRN
Start: 2022-10-27 — End: 2023-04-13

## 2022-10-27 NOTE — Progress Notes (Signed)
Subjective:    Patient ID: Melinda BailiffVirginia G Portell, female    DOB: [birth year], 82 y.o.   MRN: [id number]  No chief complaint on file.  Today's visit was for Transitional Care Management.  The patient [facility] with a primary diagnosis of altered mental status.   Contact with the patient and/or caregiver, by a clinical staff member, was made on 10/20/22 and was documented as a telephone encounter within the EMR.  Through chart review and discussion with the patient I have determined that management of their condition is of moderate complexity.   PT went to the ED on 10/17/22 with change in mental status change. She was diagnosed with community acquired pneumonia of right lower lobe.   She had a negative CT head.    X-ray showed, "1. Persistent heterogeneous airspace opacification of the right mid  and lower lung, although mildly decreased in density and with  improved aeration compared to yesterday's 10/17/2022 radiograph.  Again recommend follow-up radiographs 4-6 weeks after treatment to  assess resolution.  2. Additional likely retrocardiac opacity, which could represent  atelectasis or pneumonia. "  She was discharged on Cipro. States she is feeling better, but states she continues to  feel fatigued.   She is using 2-3 L of O2.   She is also complaining of oral thrush that started while she was in the hospital. She has been using nystatin with mild relief.  Cough This is a chronic problem. The current episode started 1 to 4 weeks ago. The problem has been waxing and waning. The problem occurs every few minutes. The cough is Non-productive. Associated symptoms include headaches, nasal congestion, shortness of breath and wheezing. Pertinent negatives include no chills, ear congestion, ear pain or fever. She has tried rest and OTC cough suppressant for the symptoms. The treatment provided mild relief. Her past medical history is significant for COPD  and pneumonia.      Review of Systems  Constitutional:  Negative for chills and fever.  HENT:  Negative for ear pain.   Respiratory:  Positive for cough, shortness of breath and wheezing.   Neurological:  Positive for headaches.  All other systems reviewed and are negative.  Family History  Problem Relation Age of Onset   Heart disease Mother    Stroke Mother    Diabetes Mother    Heart attack Father    Kidney disease Father    Cancer Father        prostate   Cancer Brother    Heart attack Brother 1861   Cancer Brother        prostate   Heart disease Brother        CHF   COPD Brother    Coronary artery disease Other        fhx   Social History   Socioeconomic History   Marital status: Married    Spouse name: Not on file   Number of children: 1   Years of education: Not on file   Highest education level: Not on file  Occupational History   Occupation: retired  Tobacco Use   Smoking status: Former    Packs/day: 1    Types: Cigarettes    Start date: 07/18/1960   Smokeless tobacco: Never   Tobacco comments:    Qut last week.  08/10/16 restarted smoking cigarettes  Vaping Use   Vaping Use: Never used  Substance and Sexual Activity   Alcohol use: No  Alcohol/week: 0.0 standard drinks of alcohol   Drug use: No   Sexual activity: Not on file  Other Topics Concern   Not on file  Social History Narrative   Single; disabled.    Son lives near and daughter in law is Charity fundraiser - they help her daily   Social Determinants of Health   Financial Resource Strain: Low Risk  (04/08/2021)   Overall Financial Resource Strain (CARDIA)    Difficulty of Paying Living Expenses: Not very hard  Food Insecurity: No Food Insecurity (04/08/2021)   Hunger Vital Sign    Worried About Running Out of Food in the Last Year: Never true    Ran Out of Food in the Last Year: Never true  Transportation Needs: No Transportation Needs (04/08/2021)   PRAPARE - Scientist, research (physical sciences) (Medical): No    Lack of Transportation (Non-Medical): No  Physical Activity: Inactive (04/08/2021)   Exercise Vital Sign    Days of Exercise per Week: 0 days    Minutes of Exercise per Session: 0 min  Stress: Stress Concern Present (04/08/2021)   [employer] of Occupational Health - Occupational Stress Questionnaire    Feeling of Stress : Rather much  Social Connections: Socially Isolated (04/08/2021)   Social Connection and Isolation Panel [NHANES]    Frequency of Communication with Friends and Family: Three times a week    Frequency of Social Gatherings with Friends and Family: Three times a week    Attends Religious Services: Never    Active Member of Clubs or Organizations: No    Attends Banker Meetings: Never    Marital Status: Never married       Objective:   Physical Exam Vitals reviewed.  Constitutional:      General: She is not in acute distress.    Appearance: She is well-developed.  HENT:     Head: Normocephalic and atraumatic.     Right Ear: Tympanic membrane normal.     Left Ear: Tympanic membrane normal.  Eyes:     Pupils: Pupils are equal, round, and reactive to light.  Neck:     Thyroid: No thyromegaly.  Cardiovascular:     Rate and Rhythm: Normal rate and regular rhythm.     Heart sounds: Normal heart sounds. No murmur heard. Pulmonary:     Effort: Pulmonary effort is normal. No respiratory distress.     Breath sounds: Examination of the right-lower field reveals rhonchi and rales. Decreased breath sounds, rhonchi and rales present. No wheezing.  Abdominal:     General: Bowel sounds are normal. There is no distension.     Palpations: Abdomen is soft.     Tenderness: There is no abdominal tenderness.  Musculoskeletal:        General: No tenderness. Normal range of motion.     Cervical back: Normal range of motion and neck supple.  Skin:    General: Skin is warm and dry.  Neurological:     Mental Status: She is alert  and oriented to person, place, and time.     Cranial Nerves: No cranial nerve deficit.     Deep Tendon Reflexes: Reflexes are normal and symmetric.  Psychiatric:        Behavior: Behavior normal.        Thought Content: Thought content normal.        Judgment: Judgment normal.       BP 128/85   Pulse 75   Temp (!) 97.5  F (36.4 C) (Temporal)   Ht 5\' 3"  (1.6 m)   Wt 184 lb (83.5 kg)   SpO2 96% Comment: on 2 liters of O2  BMI 32.59 kg/m      Assessment & Plan:  [place] Shellenbarger comes in today with chief complaint of Hospitalization Follow-up   Diagnosis and orders addressed:  1. Community acquired pneumonia of right lower lobe of lung - DG Chest 2 View; Future - CMP14+EGFR - CBC with Differential/Platelet  2. Hospital discharge follow-up - CMP14+EGFR - CBC with Differential/Platelet  3. Oral thrush - magic mouthwash w/lidocaine SOLN; Take 10 mLs by mouth 3 (three) times daily as needed for mouth pain.  Dispense: 250 mL; Refill: 0 - CMP14+EGFR - CBC with Differential/Platelet  Continue Cipro  X-ray pending  Labs pending Red flags discussed to be seen such as increased SOB, fever, or increased cough Health Maintenance reviewed Diet and exercise encouraged  Follow up plan: Keep chronic follow   Jannifer Rodney, FNP

## 2022-10-27 NOTE — Patient Instructions (Signed)
Community-Acquired Pneumonia, Adult Pneumonia is a lung infection that causes inflammation and the buildup of mucus and fluids in the lungs. This may cause coughing and difficulty breathing. Community-acquired pneumonia is pneumonia that develops in people who are not, and have not recently been, in a hospital or other health care facility. Usually, pneumonia develops as a result of an illness that is caused by a virus, such as the common cold and the flu (influenza). It can also be caused by bacteria or fungi. While the common cold and influenza can pass from person to person (are contagious), pneumonia itself is not considered contagious. What are the causes? This condition may be caused by: Viruses. Bacteria. Fungi. What increases the risk? The following factors may make you more likely to develop this condition: Being over age 65 or having certain medical conditions, such as: A long-term (chronic) disease, such as: chronic obstructive pulmonary disease (COPD), asthma, heart failure, diabetes, or kidney disease. A condition that increases the risk of breathing in (aspirating) mucus and other fluids from your mouth and nose. A weakened body defense system (immune system). Having had your spleen removed (splenectomy). The spleen is the organ that helps fight germs and infections. Not cleaning your teeth and gums well (poor dental hygiene). Using tobacco products. Traveling to places where germs that cause pneumonia are present or being near certain animals or animal habitats that could have germs that cause pneumonia. What are the signs or symptoms? Symptoms of this condition include: A dry cough or a wet (productive) cough. A fever, sweating, or chills. Chest pain, especially when breathing deeply or coughing. Fast breathing, difficulty breathing, or shortness of breath. Tiredness (fatigue) and muscle aches. How is this diagnosed? This condition may be diagnosed based on your medical  history or a physical exam. You may also have tests, including: Imaging, such as a chest X-ray or lung ultrasound. Tests of: The level of oxygen and other gases in your blood. Mucus from your lungs (sputum). Fluid around your lungs (pleural fluid). Your urine. How is this treated? Treatment for this condition depends on many factors, such as the cause of your pneumonia, your medicines, and other medical conditions that you have. For most adults, pneumonia may be treated at home. In some cases, treatment must happen in a hospital and may include: Medicines that are given by mouth (orally) or through an IV, including: Antibiotic medicines, if bacteria caused the pneumonia. Medicines that kill viruses (antiviral medicines), if a virus caused the pneumonia. Oxygen therapy. Severe pneumonia, although rare, may require the following treatments: Mechanical ventilation.This procedure uses a machine to help you breathe if you cannot breathe well on your own or maintain a safe level of blood oxygen. Thoracentesis. This procedure removes any buildup of pleural fluid to help with breathing. Follow these instructions at home:  Medicines Take over-the-counter and prescription medicines only as told by your health care provider. Take cough medicine only if you have trouble sleeping. Cough medicine can prevent your body from removing mucus from your lungs. If you were prescribed antibiotics, take them as told by your health care provider. Do not stop taking the antibiotic even if you start to feel better. Lifestyle     Do not drink alcohol. Do not use any products that contain nicotine or tobacco. These products include cigarettes, chewing tobacco, and vaping devices, such as e-cigarettes. If you need help quitting, ask your health care provider. Eat a healthy diet. This includes plenty of vegetables, fruits, whole grains, low-fat   dairy products, and lean protein. General instructions Rest a lot and  get at least 8 hours of sleep each night. Sleep in a partly upright position at night. Place a few pillows under your head or sleep in a reclining chair. Return to your normal activities as told by your health care provider. Ask your health care provider what activities are safe for you. Drink enough fluid to keep your urine pale yellow. This helps to thin the mucus in your lungs. If your throat is sore, gargle with a mixture of salt and water 3-4 times a day or as needed. To make salt water, completely dissolve -1 tsp (3-6 g) of salt in 1 cup (237 mL) of warm water. Keep all follow-up visits. How is this prevented? You can lower your risk of developing community-acquired pneumonia by: Getting the pneumonia vaccine. There are different types and schedules of pneumonia vaccines. Ask your health care provider which option is best for you. Consider getting the pneumonia vaccine if: You are older than 81 years of age. You are 19-65 years of age and are receiving cancer treatment, have chronic lung disease, or have other medical conditions that affect your immune system. Ask your health care provider if this applies to you. Getting your influenza vaccine every year. Ask your health care provider which type of vaccine is best for you. Getting regular dental checkups. Washing your hands often with soap and water for at least 20 seconds. If soap and water are not available, use hand sanitizer. Contact a health care provider if: You have a fever. You have trouble sleeping because you cannot control your cough with cough medicine. Get help right away if: Your shortness of breath becomes worse. Your chest pain increases. Your sickness becomes worse, especially if you are an older adult or have a weak immune system. You cough up blood. These symptoms may be an emergency. Get help right away. Call 911. Do not wait to see if the symptoms will go away. Do not drive yourself to the  hospital. Summary Pneumonia is an infection of the lungs. Community-acquired pneumonia develops in people who have not been in the hospital. It can be caused by bacteria, viruses, or fungi. This condition may be treated with antibiotics or antiviral medicines. Severe pneumonia may require a hospital stay and treatment to help with breathing. This information is not intended to replace advice given to you by your health care provider. Make sure you discuss any questions you have with your health care provider. Document Revised: 09/01/2021 Document Reviewed: 09/01/2021 Elsevier Patient Education  2023 Elsevier Inc.  

## 2022-10-28 ENCOUNTER — Other Ambulatory Visit: Payer: Self-pay

## 2022-10-28 ENCOUNTER — Ambulatory Visit: Payer: 59 | Attending: Cardiology | Admitting: Cardiology

## 2022-10-28 ENCOUNTER — Encounter: Payer: Self-pay | Admitting: Cardiology

## 2022-10-28 VITALS — BP 118/78 | Ht 63.0 in | Wt 185.0 lb

## 2022-10-28 DIAGNOSIS — I350 Nonrheumatic aortic (valve) stenosis: Secondary | ICD-10-CM

## 2022-10-28 DIAGNOSIS — I4821 Permanent atrial fibrillation: Secondary | ICD-10-CM

## 2022-10-28 DIAGNOSIS — N1832 Chronic kidney disease, stage 3b: Secondary | ICD-10-CM

## 2022-10-28 DIAGNOSIS — N289 Disorder of kidney and ureter, unspecified: Secondary | ICD-10-CM

## 2022-10-28 LAB — CMP14+EGFR
ALT: 23 IU/L (ref 0–32)
AST: 22 IU/L (ref 0–40)
Albumin/Globulin Ratio: 1.7 (ref 1.2–2.2)
Albumin: 3.3 g/dL — ABNORMAL LOW (ref 3.8–4.8)
Alkaline Phosphatase: 82 IU/L (ref 44–121)
BUN/Creatinine Ratio: 14 (ref 12–28)
BUN: 23 mg/dL (ref 8–27)
Bilirubin Total: 0.3 mg/dL (ref 0.0–1.2)
CO2: 22 mmol/L (ref 20–29)
Calcium: 8.8 mg/dL (ref 8.7–10.3)
Chloride: 106 mmol/L (ref 96–106)
Creatinine, Ser: 1.68 mg/dL — ABNORMAL HIGH (ref 0.57–1.00)
Globulin, Total: 2 g/dL (ref 1.5–4.5)
Glucose: 97 mg/dL (ref 70–99)
Potassium: 4.5 mmol/L (ref 3.5–5.2)
Sodium: 142 mmol/L (ref 134–144)
Total Protein: 5.3 g/dL — ABNORMAL LOW (ref 6.0–8.5)
eGFR: 31 mL/min/{1.73_m2} — ABNORMAL LOW (ref >59)

## 2022-10-28 LAB — CBC WITH DIFFERENTIAL/PLATELET
Basophils Absolute: 0.1 x10E3/uL (ref 0.0–0.2)
Basos: 1 %
EOS (ABSOLUTE): 0.2 x10E3/uL (ref 0.0–0.4)
Eos: 2 %
Hematocrit: 36.3 % (ref 34.0–46.6)
Hemoglobin: 11.6 g/dL (ref 11.1–15.9)
Immature Grans (Abs): 0.1 x10E3/uL (ref 0.0–0.1)
Immature Granulocytes: 1 %
Lymphocytes Absolute: 1.1 x10E3/uL (ref 0.7–3.1)
Lymphs: 11 %
MCH: 30.1 pg (ref 26.6–33.0)
MCHC: 32 g/dL (ref 31.5–35.7)
MCV: 94 fL (ref 79–97)
Monocytes Absolute: 0.5 x10E3/uL (ref 0.1–0.9)
Monocytes: 5 %
Neutrophils Absolute: 8 x10E3/uL — ABNORMAL HIGH (ref 1.4–7.0)
Neutrophils: 80 %
Platelets: 263 x10E3/uL (ref 150–450)
RBC: 3.85 x10E6/uL (ref 3.77–5.28)
RDW: 15.2 % (ref 11.7–15.4)
WBC: 9.9 x10E3/uL (ref 3.4–10.8)

## 2022-10-28 NOTE — Progress Notes (Signed)
Cardiology Office Note  Date: 10/28/2022   ID: Melinda Collins, DOB Jan 22, 1941, MRN [id number]  History of Present Illness: Melinda Collins is an 82 y.o. female last seen in October 2023.  She is here family member for a follow-up visit. Record review finds recent hospitalization at [facility] with acute on chronic hypoxic respiratory failure in the setting of community-acquired pneumonia.  High-sensitivity troponin I levels were normal.  She did have acute on chronic renal insufficiency with creatinine up to 2.66 although recent follow-up lab work shows creatinine down to 1.68.  She is in a wheelchair today, continues to use supplemental oxygen as before.  Fairly low level activity at baseline, chronic dyspnea on exertion.  No recent cough.  She reports compliance with her medications and continues on Coumadin which is checked from home and managed by PCP.  She continues with use of diuretics on an as-needed basis at this point, otherwise Toprol-XL and Cardizem CD for heart rate control.  I reviewed her ECG today which shows rate controlled atrial fibrillation in the 90s with left anterior fascicular block and right bundle branch block.  Physical Exam: VS:  BP 118/78   Ht  (1.6 m)   Wt 185 lb (83.9 kg)   SpO2 96%   BMI 32.77 kg/m , BMI Body mass index is 32.77 kg/m.  Wt Readings from Last 3 Encounters:  10/28/22 185 lb (83.9 kg)  10/27/22 184 lb (83.5 kg)  09/06/22 184 lb (83.5 kg)    General: Patient appears comfortable at rest.  Wearing oxygen via nasal cannula. HEENT: Conjunctiva and lids normal. Neck: Supple, no elevated JVP or carotid bruits. Lungs: Decreased breath sounds with prolonged expiratory phase and mild expiratory wheeze. Cardiac: Distant heart sounds, irregularly irregular without gallop, 2/6 systolic murmur. Extremities: No pitting edema.  ECG:  An ECG dated 10/17/2022 was personally reviewed today and demonstrated:  Atrial fibrillation with RVR,  right bundle branch block, left anterior fascicular block, nonspecific ST changes.  Labwork: 09/12/2022: B Natriuretic Peptide 317.0 10/27/2022: ALT 23; AST 22; BUN 23; Creatinine, Ser 1.68; Hemoglobin 11.6; Platelets 263; Potassium 4.5; Sodium 142   Other Studies Reviewed Today:  Echocardiogram 03/29/2022:  1. Left ventricular ejection fraction, by estimation, is 60 to 65%. The  left ventricle has normal function. The left ventricle has no regional  wall motion abnormalities. There is mild left ventricular hypertrophy.  Left ventricular diastolic parameters  are indeterminate. The average left ventricular global longitudinal strain  is -18.5 %. The global longitudinal strain is normal.   2. Right ventricular systolic function is normal. The right ventricular  size is normal. There is normal pulmonary artery systolic pressure.   3. Left atrial size was severely dilated.   4. Right atrial size was severely dilated.   5. The mitral valve is abnormal. Mild mitral valve regurgitation. No  evidence of mitral stenosis. Severe mitral annular calcification.   6. The tricuspid valve is abnormal.   7. The aortic valve has an indeterminant number of cusps. There is severe  calcifcation of the aortic valve. There is severe thickening of the aortic  valve. Aortic valve regurgitation is moderate. Moderate to severe aortic  valve stenosis. Aortic valve  mean gradient measures 23.0 mmHg. Aortic valve peak gradient measures 73.3  mmHg. Aortic valve area, by VTI measures 0.92 cm. DI 0.29   8. The inferior vena cava is normal in size with greater than 50%  respiratory variability, suggesting right atrial pressure of 3  mmHg.   Assessment and Plan:  1.  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  She declined workup for TAVR.  Conservative follow-up is planned at this  point, ultimately would anticipate a transition to palliative care.  2.  CKD stage IIIb, recent creatinine 1.68 with acute exacerbation during hospital stay with pneumonia.  No change in present as needed diuretic plan.  3.  Permanent atrial fibrillation with CHA2DS2-VASc score of 4.  She is on Coumadin for stroke prophylaxis with follow-up by PCP.  Continues on Cardizem CD and Toprol-XL with good heart rate control at rest today.  4.  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  Recently hospitalized with community-acquired pneumonia.  Disposition:  Follow up  6 months.  Signed, Jonelle Sidle, M.D., F.A.C.C. [facility]

## 2022-10-28 NOTE — Patient Instructions (Addendum)

## 2022-11-07 ENCOUNTER — Telehealth: Payer: Self-pay | Admitting: Family

## 2022-11-07 NOTE — Telephone Encounter (Signed)
Patient needs to schedule an xray, she had an appt on 4/11 and the order was added but the daughter is unable to bring her until 4/23 after lunch. Please call back to schedule.

## 2022-11-07 NOTE — Telephone Encounter (Signed)
APPT MADE FOR TOMORROW

## 2022-11-08 ENCOUNTER — Other Ambulatory Visit: Payer: Self-pay | Admitting: Family

## 2022-11-08 ENCOUNTER — Other Ambulatory Visit (INDEPENDENT_AMBULATORY_CARE_PROVIDER_SITE_OTHER): Payer: 59

## 2022-11-08 ENCOUNTER — Other Ambulatory Visit: Payer: 59

## 2022-11-08 DIAGNOSIS — J189 Pneumonia, unspecified organism: Secondary | ICD-10-CM

## 2022-11-08 DIAGNOSIS — N289 Disorder of kidney and ureter, unspecified: Secondary | ICD-10-CM

## 2022-11-08 MED ORDER — DOXYCYCLINE HYCLATE 100 MG PO TABS: 100.0000 mg | ORAL_TABLET | Freq: Two times a day (BID) | ORAL | 0 refills | Status: DC

## 2022-11-08 MED ORDER — PREDNISONE 10 MG (21) PO TBPK: ORAL_TABLET | ORAL | 0 refills | Status: DC

## 2022-11-09 ENCOUNTER — Other Ambulatory Visit: Payer: 59

## 2022-11-09 ENCOUNTER — Other Ambulatory Visit: Payer: Self-pay | Admitting: Family

## 2022-11-09 LAB — CMP14+EGFR
ALT: 13 IU/L (ref 0–32)
AST: 18 IU/L (ref 0–40)
Albumin/Globulin Ratio: 2.1 (ref 1.2–2.2)
Albumin: 3.6 g/dL — ABNORMAL LOW (ref 3.8–4.8)
Alkaline Phosphatase: 81 IU/L (ref 44–121)
BUN/Creatinine Ratio: 15 (ref 12–28)
BUN: 23 mg/dL (ref 8–27)
Bilirubin Total: 0.6 mg/dL (ref 0.0–1.2)
CO2: 17 mmol/L — ABNORMAL LOW (ref 20–29)
Calcium: 8.4 mg/dL — ABNORMAL LOW (ref 8.7–10.3)
Chloride: 110 mmol/L — ABNORMAL HIGH (ref 96–106)
Creatinine, Ser: 1.53 mg/dL — ABNORMAL HIGH (ref 0.57–1.00)
Globulin, Total: 1.7 g/dL (ref 1.5–4.5)
Glucose: 107 mg/dL — ABNORMAL HIGH (ref 70–99)
Potassium: 4.8 mmol/L (ref 3.5–5.2)
Sodium: 145 mmol/L — ABNORMAL HIGH (ref 134–144)
Total Protein: 5.3 g/dL — ABNORMAL LOW (ref 6.0–8.5)
eGFR: 34 mL/min/{1.73_m2} — ABNORMAL LOW (ref >59)

## 2022-11-11 ENCOUNTER — Telehealth: Payer: Self-pay | Admitting: Family Medicine

## 2022-11-11 NOTE — Telephone Encounter (Signed)
Lmtcb.

## 2022-11-11 NOTE — Telephone Encounter (Signed)
Will wait for updated INR

## 2022-11-11 NOTE — Telephone Encounter (Signed)
Fax received mdINR PT/INR self testing service Test date/time 11/06/22 11:01 INR 2.9

## 2022-11-11 NOTE — Telephone Encounter (Signed)
Description   INR  2.2 Goal 2-3) Continue 5 mg daily Recheck in 1 to 2 weeks     

## 2022-11-11 NOTE — Telephone Encounter (Signed)
Fax received mdINR PT/INR self testing service Test date/time 10/23/22 11:00 INR 2.7

## 2022-11-11 NOTE — Telephone Encounter (Signed)
Back dated INR from 10/30/2022 3.5

## 2022-11-16 ENCOUNTER — Telehealth: Payer: Self-pay | Admitting: *Deleted

## 2022-11-16 NOTE — Telephone Encounter (Signed)
NA can't not leave VM patient likes sent to [facility]

## 2022-11-16 NOTE — Telephone Encounter (Signed)
Description   INR  2.4  (Goal 2-3) Continue 5 mg daily Recheck in 1 to 2 weeks, patient does home INRs    Arville Care, MD [facility] 11/16/2022, 12:30 PM

## 2022-11-16 NOTE — Telephone Encounter (Signed)
Fax received mdINR PT/INR self testing service Test date/time 11/13/22 914 am INR 2.4

## 2022-12-05 NOTE — Telephone Encounter (Signed)
Erroneous encounter will close.

## 2022-12-07 ENCOUNTER — Telehealth: Payer: Self-pay | Admitting: Family

## 2022-12-08 ENCOUNTER — Ambulatory Visit: Payer: 59 | Admitting: Family

## 2022-12-08 NOTE — Telephone Encounter (Signed)
ok 

## 2022-12-14 ENCOUNTER — Ambulatory Visit (INDEPENDENT_AMBULATORY_CARE_PROVIDER_SITE_OTHER): Payer: 59 | Admitting: Family Medicine

## 2022-12-14 ENCOUNTER — Encounter: Payer: Self-pay | Admitting: Family Medicine

## 2022-12-14 ENCOUNTER — Ambulatory Visit (INDEPENDENT_AMBULATORY_CARE_PROVIDER_SITE_OTHER): Payer: 59

## 2022-12-14 DIAGNOSIS — M545 Low back pain, unspecified: Secondary | ICD-10-CM

## 2022-12-14 DIAGNOSIS — J189 Pneumonia, unspecified organism: Secondary | ICD-10-CM | POA: Diagnosis not present

## 2022-12-14 DIAGNOSIS — F339 Major depressive disorder, recurrent, unspecified: Secondary | ICD-10-CM

## 2022-12-14 DIAGNOSIS — I4891 Unspecified atrial fibrillation: Secondary | ICD-10-CM

## 2022-12-14 DIAGNOSIS — M4716 Other spondylosis with myelopathy, lumbar region: Secondary | ICD-10-CM

## 2022-12-14 DIAGNOSIS — E782 Mixed hyperlipidemia: Secondary | ICD-10-CM

## 2022-12-14 DIAGNOSIS — G8929 Other chronic pain: Secondary | ICD-10-CM

## 2022-12-14 MED ORDER — ATORVASTATIN CALCIUM 80 MG PO TABS
ORAL_TABLET | ORAL | 0 refills | Status: DC
Start: 2022-12-14 — End: 2023-02-15

## 2022-12-14 MED ORDER — TORSEMIDE 20 MG PO TABS: 20.0000 mg | ORAL_TABLET | Freq: Every day | ORAL | 0 refills | Status: DC

## 2022-12-14 MED ORDER — WARFARIN SODIUM 5 MG PO TABS
ORAL_TABLET | ORAL | 0 refills | Status: DC
Start: 2022-12-14 — End: 2023-09-09

## 2022-12-14 MED ORDER — ESOMEPRAZOLE MAGNESIUM 40 MG PO CPDR: DELAYED_RELEASE_CAPSULE | ORAL | 0 refills | Status: DC

## 2022-12-14 MED ORDER — DULOXETINE HCL 60 MG PO CPEP
ORAL_CAPSULE | ORAL | 0 refills | Status: DC
Start: 2022-12-14 — End: 2023-02-15

## 2022-12-14 MED ORDER — DILTIAZEM HCL ER COATED BEADS 180 MG PO CP24
ORAL_CAPSULE | ORAL | 0 refills | Status: DC
Start: 2022-12-14 — End: 2023-04-13

## 2022-12-14 NOTE — Progress Notes (Signed)
Only needed repeat CXR, not seen as this was scheduled incorrectly.

## 2022-12-15 MED ORDER — LEVOFLOXACIN 500 MG PO TABS
500.0000 mg | ORAL_TABLET | Freq: Every day | ORAL | 0 refills | Status: AC
Start: 2022-12-15 — End: 2022-12-22

## 2022-12-15 MED ORDER — PREDNISONE 10 MG (21) PO TBPK
ORAL_TABLET | ORAL | 0 refills | Status: DC
Start: 2022-12-15 — End: 2023-02-17

## 2022-12-30 ENCOUNTER — Telehealth (INDEPENDENT_AMBULATORY_CARE_PROVIDER_SITE_OTHER): Payer: 59 | Admitting: Family

## 2022-12-30 ENCOUNTER — Encounter: Payer: Self-pay | Admitting: Family

## 2022-12-30 DIAGNOSIS — G8929 Other chronic pain: Secondary | ICD-10-CM

## 2022-12-30 DIAGNOSIS — I700 Atherosclerosis of aorta: Secondary | ICD-10-CM

## 2022-12-30 DIAGNOSIS — M545 Low back pain, unspecified: Secondary | ICD-10-CM | POA: Diagnosis not present

## 2022-12-30 DIAGNOSIS — J410 Simple chronic bronchitis: Secondary | ICD-10-CM

## 2022-12-30 DIAGNOSIS — J9611 Chronic respiratory failure with hypoxia: Secondary | ICD-10-CM

## 2022-12-30 DIAGNOSIS — Z515 Encounter for palliative care: Secondary | ICD-10-CM

## 2022-12-30 DIAGNOSIS — M4716 Other spondylosis with myelopathy, lumbar region: Secondary | ICD-10-CM

## 2022-12-30 DIAGNOSIS — F339 Major depressive disorder, recurrent, unspecified: Secondary | ICD-10-CM

## 2022-12-30 DIAGNOSIS — E782 Mixed hyperlipidemia: Secondary | ICD-10-CM

## 2022-12-30 DIAGNOSIS — F411 Generalized anxiety disorder: Secondary | ICD-10-CM

## 2022-12-30 DIAGNOSIS — J189 Pneumonia, unspecified organism: Secondary | ICD-10-CM

## 2022-12-30 MED ORDER — ALPRAZOLAM 0.25 MG PO TABS
0.2500 mg | ORAL_TABLET | Freq: Every evening | ORAL | 2 refills | Status: DC | PRN
Start: 2022-12-30 — End: 2023-04-13

## 2022-12-30 MED ORDER — LEVALBUTEROL HCL 0.63 MG/3ML IN NEBU
INHALATION_SOLUTION | RESPIRATORY_TRACT | 5 refills | Status: DC
Start: 2022-12-30 — End: 2023-04-13

## 2022-12-30 NOTE — Progress Notes (Signed)
Virtual Visit Consent   Melinda Collins, you are scheduled for a virtual visit with a [facility] provider today. Just as with appointments in the office, your consent must be obtained to participate. Your consent will be active for this visit and any virtual visit you may have with one of our providers in the next 365 days. If you have a MyChart account, a copy of this consent can be sent to you electronically.  As this is a virtual visit, video technology does not allow for your provider to perform a traditional examination. This may limit your provider's ability to fully assess your condition. If your provider identifies any concerns that need to be evaluated in person or the need to arrange testing (such as labs, EKG, etc.), we will make arrangements to do so. Although advances in technology are sophisticated, we cannot ensure that it will always work on either your end or our end. If the connection with a video visit is poor, the visit may have to be switched to a telephone visit. With either a video or telephone visit, we are not always able to ensure that we have a secure connection.  By engaging in this virtual visit, you consent to the provision of healthcare and authorize for your insurance to be billed (if applicable) for the services provided during this visit. Depending on your insurance coverage, you may receive a charge related to this service.  I need to obtain your verbal consent now. Are you willing to proceed with your visit today? Melinda Collins has provided verbal consent on 12/30/2022 for a virtual visit (video or telephone). Jannifer Rodney, FNP  Date: 12/30/2022 9:32 AM  Virtual Visit via Video Note   I, Jannifer Rodney, connected with  Melinda Collins  ([id number], October 02, 1978) on 12/30/22 at  9:25 AM EDT by a video-enabled telemedicine application and verified that I am speaking with the correct person using two identifiers.  Location: Patient: Virtual Visit Location  Patient: Home Provider: Virtual Visit Location Provider: Home Office   I discussed the limitations of evaluation and management by telemedicine and the availability of in person appointments. The patient expressed understanding and agreed to proceed.    History of Present Illness: Melinda G Gola is a 82 y.o. who identifies as a female who was assigned female at birth, and is being seen today for chronic follow up.  We decreased her demadex just as needed. She has  taken 2 times a week.   She is followed by Palliative care. She is followed by Ortho and getting back injections. She is followed by Cardiologists.    She reports her breathing is stable and using 2L of O2. She has COPD and quit smoking 07/2020. Uses Trelegy daily and using nebulizer BID.    She has aortic atherosclerosis and takes Lipitor daily.    Pain in her back is worse. She can not tolerate the oxycodone. She has only been taking tylenol. Only takes xanax as needed.    She has an appointment with Neurosurgeon for back injection. States her pain is 10 out 10 and can not get up and walk.  She has CAP and has completed doxycyline and prdinsone. Needs follow up chest x-ray.  HPI: Hypertension This is a chronic problem. The current episode started more than 1 year ago. The problem has been resolved since onset. The problem is controlled. Associated symptoms include anxiety, malaise/fatigue and shortness of breath. Pertinent negatives include no peripheral edema. Risk factors for coronary  artery disease include female gender, dyslipidemia and sedentary lifestyle. The current treatment provides moderate improvement.  Congestive Heart Failure Presents for follow-up visit. Associated symptoms include edema, fatigue and shortness of breath. The symptoms have been stable.  Back Pain This is a chronic problem. The current episode started more than 1 year ago. The problem occurs intermittently. The problem has been waxing and waning  since onset. The pain is present in the lumbar spine. The quality of the pain is described as aching. The pain is at a severity of 10/10. The pain is moderate. The treatment provided moderate relief.  Hyperlipidemia This is a chronic problem. The current episode started more than 1 year ago. Exacerbating diseases include obesity. Associated symptoms include shortness of breath. Current antihyperlipidemic treatment includes diet change. The current treatment provides mild improvement of lipids. Risk factors for coronary artery disease include dyslipidemia, hypertension and a sedentary lifestyle.  Anxiety Presents for follow-up visit. Symptoms include excessive worry, nervous/anxious behavior and shortness of breath. Symptoms occur occasionally. The severity of symptoms is moderate.    Depression        This is a chronic problem.  The current episode started more than 1 year ago.   Associated symptoms include fatigue, helplessness, hopelessness and sad.  Past medical history includes anxiety.     Problems:  Patient Active Problem List   Diagnosis Date Noted   Congenital heart failure (HCC) 10/15/2021   Supratherapeutic INR 06/11/2021   Chronic respiratory failure with hypoxia (HCC) 06/10/2021   Lobar pneumonia (HCC) 06/10/2021   Metabolic encephalopathy    Palliative care patient 05/27/2021   COPD (chronic obstructive pulmonary disease) (HCC)    Acute on chronic respiratory failure (HCC) 04/25/2021   Hyperkalemia 04/25/2021   GAD (generalized anxiety disorder) 04/13/2021   Pulmonary emphysema (HCC) 08/24/2020   Vitamin D deficiency 06/21/2019   Depression, recurrent (HCC) 06/21/2019   Lumbar spondylosis with myelopathy 09/11/2017   Pulmonary nodules 02/08/2017   Aortic atherosclerosis (HCC) 04/22/2016   Permanent atrial fibrillation (HCC) 10/16/2014   Metabolic syndrome 12/03/2013   History of uterine cancer 12/03/2013   Chronic midline low back pain without sciatica 05/23/2013   High  risk medication use 05/23/2013   Swelling of limb 05/03/2013   Hyperlipidemia 02/10/2009   Pulmonary hypertension (HCC) 02/10/2009   Atrial fibrillation with RVR (HCC) 02/10/2009    Allergies:  Allergies  Allergen Reactions   Baclofen     Feels crazy   Morphine Other (See Comments)   Niaspan [Niacin]    Flexeril [Cyclobenzaprine]     Feels crazy   Medications:  Current Outpatient Medications:    acetaminophen (TYLENOL) 325 MG tablet, Take 650 mg by mouth every 6 (six) hours as needed for moderate pain., Disp: , Rfl:    acetaminophen-codeine (TYLENOL #2) 300-15 MG tablet, SMARTSIG:0.5 Tablet(s) By Mouth Every 4-6 Hours PRN, Disp: , Rfl:    albuterol (VENTOLIN HFA) 108 (90 Base) MCG/ACT inhaler, INHALE 1-2 PUFFS INTO THE LUNGS EVERY 4 TO 6 HOURS AS NEEDED FOR SHORTNESS OF BREATH, Disp: 8.5 g, Rfl: 3   ALPRAZolam (XANAX) 0.25 MG tablet, Take 1 tablet (0.25 mg total) by mouth at bedtime as needed for anxiety., Disp: 30 tablet, Rfl: 2   atorvastatin (LIPITOR) 80 MG tablet, TAKE ONE TABLET BY MOUTH DAILY AT 6 PM, Disp: 30 tablet, Rfl: 0   bismuth subsalicylate (PEPTO BISMOL) 262 MG/15ML suspension, Take 30 mLs by mouth every 6 (six) hours as needed., Disp: , Rfl:    cyanocobalamin (VITAMIN  B12) 1000 MCG tablet, TAKE ONE TABLET BY MOUTH DAILY WITH BREAKFAST, Disp: 90 tablet, Rfl: 1   diclofenac sodium (VOLTAREN) 1 % GEL, Apply 2 g topically 4 (four) times daily., Disp: 350 g, Rfl: 3   diltiazem (CARDIZEM CD) 180 MG 24 hr capsule, TAKE ONE CAPSULE BY MOUTH DAILY (,,EVENING), Disp: 30 capsule, Rfl: 0   DULoxetine (CYMBALTA) 60 MG capsule, TAKE TWO (2) CAPSULES BY MOUTH DAILY (PER DAUGHTER ONE IN THE MORNING AND IN THE EVENING), Disp: 60 capsule, Rfl: 0   esomeprazole (NEXIUM) 40 MG capsule, 1 capsule daily, Disp: 30 capsule, Rfl: 0   fluticasone (FLONASE) 50 MCG/ACT nasal spray, USE ONE SPRAY IN EACH NOSTRIL ONCE DAILY. SHAKE GENTLY BEFORE USING., Disp: 16 g, Rfl: 5    Fluticasone-Umeclidin-Vilant (TRELEGY ELLIPTA) 100-62.5-25 MCG/ACT AEPB, INHALE ONE PUFF INTO THE LUNGS ONCE DAILY, Disp: 60 each, Rfl: 2   guaiFENesin (MUCINEX) 600 MG 12 hr tablet, Take 1 tablet (600 mg total) by mouth 2 (two) times daily as needed., Disp: 60 tablet, Rfl: 1   ketoconazole (NIZORAL) 2 % cream, Apply topically daily., Disp: , Rfl:    levalbuterol (XOPENEX) 0.63 MG/3ML nebulizer solution, INHALE CONTENTS OF 1 VIAL IN NEBULIZER EVERY 6 HOURS AS NEEDED FOR WHEEZING OR SHORTNESS OF BREATH, Disp: 120 mL, Rfl: 5   loratadine (ALLERGY RELIEF) 10 MG tablet, TAKE ONE TABLET BY MOUTH DAILY (,EVENING), Disp: 90 tablet, Rfl: 1   magic mouthwash w/lidocaine SOLN, Take 10 mLs by mouth 3 (three) times daily as needed for mouth pain., Disp: 250 mL, Rfl: 0   Magnesium Hydroxide (DULCOLAX PO), Take 25 mg by mouth daily., Disp: , Rfl:    metoprolol succinate (TOPROL-XL) 50 MG 24 hr tablet, Take with or immediately following a meal., Disp: 90 tablet, Rfl: 1   NON FORMULARY, Inhale 2-4 L into the lungs daily. oxygen, Disp: , Rfl:    nystatin (MYCOSTATIN) 100000 UNIT/ML suspension, Take 5 mLs (500,000 Units total) by mouth 4 (four) times daily., Disp: 473 mL, Rfl: 0   nystatin cream (MYCOSTATIN), APPLY TOPICALLY TWICE DAILY, Disp: 60 g, Rfl: 2   NYSTATIN powder, APPLY ONE APPLICATION TOPICALLY THREE TIMES DAILY, Disp: 45 g, Rfl: 1   predniSONE (STERAPRED UNI-PAK 21 TAB) 10 MG (21) TBPK tablet, Use as directed on back of pill pack, Disp: 21 tablet, Rfl: 0   Spacer/Aero-Holding Chambers DEVI, 1 Device by Does not apply route in the morning and at bedtime., Disp: 1 each, Rfl: 1   torsemide (DEMADEX) 20 MG tablet, Take 1 tablet (20 mg total) by mouth daily., Disp: 30 tablet, Rfl: 0   Vitamin D, Ergocalciferol, 50000 units CAPS, Take 50,000 Units by mouth every 7 (seven) days. Mondays, Disp: 12 capsule, Rfl: 2   warfarin (COUMADIN) 5 MG tablet, TAKE 1 OR 2 TABLETS BY MOUTH AS DIRECTED BY [facility]  (T1,TABLET DAILY EXCEPT ON TUESDAY TAKE 1 AND 1/2 TABLETS), Disp: 180 tablet, Rfl: 0  Observations/Objective: Patient is well-developed, well-nourished in no acute distress.  Resting comfortably  at home.  Head is normocephalic, atraumatic.  No labored breathing.  Speech is clear and coherent with logical content.  Patient is alert and oriented at baseline.  Oxygen present  Mild SOB, hoarse voice   Assessment and Plan: 1. Aortic atherosclerosis (HCC) - CMP14+EGFR; Future - CBC with Differential/Platelet; Future  2. Chronic midline low back pain without sciatica - CMP14+EGFR; Future - CBC with Differential/Platelet; Future  3. Chronic respiratory failure with hypoxia (HCC) - CMP14+EGFR; Future -  CBC with Differential/Platelet; Future  4. Depression, recurrent (HCC) - CMP14+EGFR; Future - CBC with Differential/Platelet; Future  5. GAD (generalized anxiety disorder) - CMP14+EGFR; Future - CBC with Differential/Platelet; Future - ALPRAZolam (XANAX) 0.25 MG tablet; Take 1 tablet (0.25 mg total) by mouth at bedtime as needed for anxiety.  Dispense: 30 tablet; Refill: 2  6. Mixed hyperlipidemia - CMP14+EGFR; Future - CBC with Differential/Platelet; Future  7. Lumbar spondylosis with myelopathy - CMP14+EGFR; Future - CBC with Differential/Platelet; Future  8. Community acquired pneumonia, unspecified laterality - CMP14+EGFR; Future - CBC with Differential/Platelet; Future - DG Chest 2 View; Future  9. Simple chronic bronchitis (HCC) - DG Chest 2 View; Future - levalbuterol (XOPENEX) 0.63 MG/3ML nebulizer solution; INHALE CONTENTS OF 1 VIAL IN NEBULIZER EVERY 6 HOURS AS NEEDED FOR WHEEZING OR SHORTNESS OF BREATH  Dispense: 120 mL; Refill: 5  10. Palliative care patient  Continue current medications  Follow up chest x-ray pending  Labs pending  Keep specialists follow up  Encourage activity as tolerated  Follow up in 3 months   Follow Up Instructions: I discussed  the assessment and treatment plan with the patient. The patient was provided an opportunity to ask questions and all were answered. The patient agreed with the plan and demonstrated an understanding of the instructions.  A copy of instructions were sent to the patient via MyChart unless otherwise noted below.    The patient was advised to call back or seek an in-person evaluation if the symptoms worsen or if the condition fails to improve as anticipated.  Time:  I spent 13 minutes with the patient via telehealth technology discussing the above problems/concerns.    Jannifer Rodney, FNP

## 2023-02-07 ENCOUNTER — Other Ambulatory Visit: Payer: Self-pay | Admitting: Family

## 2023-02-07 DIAGNOSIS — J439 Emphysema, unspecified: Secondary | ICD-10-CM

## 2023-02-13 ENCOUNTER — Other Ambulatory Visit: Payer: Self-pay | Admitting: Family

## 2023-02-15 ENCOUNTER — Other Ambulatory Visit: Payer: Self-pay | Admitting: Family Medicine

## 2023-02-15 DIAGNOSIS — M545 Low back pain, unspecified: Secondary | ICD-10-CM

## 2023-02-15 DIAGNOSIS — F339 Major depressive disorder, recurrent, unspecified: Secondary | ICD-10-CM

## 2023-02-15 DIAGNOSIS — E782 Mixed hyperlipidemia: Secondary | ICD-10-CM

## 2023-02-15 DIAGNOSIS — M4716 Other spondylosis with myelopathy, lumbar region: Secondary | ICD-10-CM

## 2023-02-17 ENCOUNTER — Ambulatory Visit (INDEPENDENT_AMBULATORY_CARE_PROVIDER_SITE_OTHER): Payer: 59

## 2023-02-17 ENCOUNTER — Other Ambulatory Visit: Payer: Self-pay | Admitting: Family

## 2023-02-17 DIAGNOSIS — J189 Pneumonia, unspecified organism: Secondary | ICD-10-CM

## 2023-02-17 DIAGNOSIS — J410 Simple chronic bronchitis: Secondary | ICD-10-CM | POA: Diagnosis not present

## 2023-02-17 MED ORDER — PREDNISONE 10 MG (21) PO TBPK
ORAL_TABLET | ORAL | 0 refills | Status: DC
Start: 2023-02-17 — End: 2023-03-16

## 2023-02-17 MED ORDER — DOXYCYCLINE HYCLATE 100 MG PO TABS
100.0000 mg | ORAL_TABLET | Freq: Two times a day (BID) | ORAL | 0 refills | Status: DC
Start: 2023-02-17 — End: 2023-03-16

## 2023-03-07 ENCOUNTER — Other Ambulatory Visit: Payer: Self-pay | Admitting: Family

## 2023-03-07 DIAGNOSIS — J439 Emphysema, unspecified: Secondary | ICD-10-CM

## 2023-03-15 ENCOUNTER — Telehealth: Payer: Self-pay | Admitting: Family

## 2023-03-15 ENCOUNTER — Other Ambulatory Visit: Payer: Self-pay | Admitting: Family Medicine

## 2023-03-15 DIAGNOSIS — J439 Emphysema, unspecified: Secondary | ICD-10-CM

## 2023-03-15 NOTE — Telephone Encounter (Signed)
Please review and advise.

## 2023-03-15 NOTE — Telephone Encounter (Signed)
  Incoming Patient Call  03/15/2023  What symptoms do you have? Cough SOB and really tired   How long have you been sick? Three weeks was put on antibiotic and steroid she was getting better but now has symptoms again. Would like refill on antibiotic and steroid  Have you been seen for this problem? yes  If your provider decides to give you a prescription, which pharmacy would you like for it to be sent to? [facility]  Patient informed that this information will be sent to the clinical staff for review and that they should receive a follow up call.

## 2023-03-16 ENCOUNTER — Encounter: Payer: Self-pay | Admitting: Family

## 2023-03-16 ENCOUNTER — Telehealth (INDEPENDENT_AMBULATORY_CARE_PROVIDER_SITE_OTHER): Payer: 59 | Admitting: Family

## 2023-03-16 ENCOUNTER — Other Ambulatory Visit: Payer: Self-pay | Admitting: Family

## 2023-03-16 DIAGNOSIS — J9620 Acute and chronic respiratory failure, unspecified whether with hypoxia or hypercapnia: Secondary | ICD-10-CM | POA: Diagnosis not present

## 2023-03-16 DIAGNOSIS — J441 Chronic obstructive pulmonary disease with (acute) exacerbation: Secondary | ICD-10-CM | POA: Diagnosis not present

## 2023-03-16 MED ORDER — PREDNISONE 20 MG PO TABS
40.0000 mg | ORAL_TABLET | Freq: Every day | ORAL | 0 refills | Status: AC
Start: 2023-03-16 — End: 2023-03-21

## 2023-03-16 MED ORDER — LEVOFLOXACIN 750 MG PO TABS
750.0000 mg | ORAL_TABLET | ORAL | 0 refills | Status: DC
Start: 2023-03-16 — End: 2023-04-13

## 2023-03-16 NOTE — Telephone Encounter (Signed)
Please make her a video visit today.   Jannifer Rodney, FNP

## 2023-03-16 NOTE — Telephone Encounter (Signed)
Apt made

## 2023-03-16 NOTE — Progress Notes (Signed)
Virtual Visit Consent   Melinda Collins, you are scheduled for a virtual visit with a [facility] provider today. Just as with appointments in the office, your consent must be obtained to participate. Your consent will be active for this visit and any virtual visit you may have with one of our providers in the next 365 days. If you have a MyChart account, a copy of this consent can be sent to you electronically.  As this is a virtual visit, video technology does not allow for your provider to perform a traditional examination. This may limit your provider's ability to fully assess your condition. If your provider identifies any concerns that need to be evaluated in person or the need to arrange testing (such as labs, EKG, etc.), we will make arrangements to do so. Although advances in technology are sophisticated, we cannot ensure that it will always work on either your end or our end. If the connection with a video visit is poor, the visit may have to be switched to a telephone visit. With either a video or telephone visit, we are not always able to ensure that we have a secure connection.  By engaging in this virtual visit, you consent to the provision of healthcare and authorize for your insurance to be billed (if applicable) for the services provided during this visit. Depending on your insurance coverage, you may receive a charge related to this service.  I need to obtain your verbal consent now. Are you willing to proceed with your visit today? Melinda Collins has provided verbal consent on 03/16/2023 for a virtual visit (video or telephone). Jannifer Rodney, FNP  Date: 03/16/2023 3:03 PM  Virtual Visit via Video Note   I, Jannifer Rodney, connected with  Melinda Collins  ([id number], [birth year]) on 03/16/23 at  6:15 PM EDT by a video-enabled telemedicine application and verified that I am speaking with the correct person using two identifiers.  Location: Patient: Virtual Visit Location  Patient: Home Provider: Virtual Visit Location Provider: Home Office   I discussed the limitations of evaluation and management by telemedicine and the availability of in person appointments. The patient expressed understanding and agreed to proceed.    History of Present Illness: Melinda Collins is a 82 y.o. who identifies as a female who was assigned female at birth, and is being seen today for cough.   HPI: Cough This is a recurrent problem. The current episode started 1 to 4 weeks ago. The problem occurs constantly. The cough is Productive of sputum. Associated symptoms include myalgias, nasal congestion, shortness of breath and wheezing. Pertinent negatives include no chills, ear pain or fever. She has tried rest and OTC cough suppressant for the symptoms.       Problems:  Patient Active Problem List   Diagnosis Date Noted   Congenital heart failure (HCC) 10/15/2021   Supratherapeutic INR 06/11/2021   Chronic respiratory failure with hypoxia (HCC) 06/10/2021   Lobar pneumonia (HCC) 06/10/2021   Metabolic encephalopathy    Palliative care patient 05/27/2021   COPD (chronic obstructive pulmonary disease) (HCC)    Acute on chronic respiratory failure (HCC) 04/25/2021   Hyperkalemia 04/25/2021   GAD (generalized anxiety disorder) 04/13/2021   Pulmonary emphysema (HCC) 08/24/2020   Vitamin D deficiency 06/21/2019   Depression, recurrent (HCC) 06/21/2019   Lumbar spondylosis with myelopathy 09/11/2017   Pulmonary nodules 02/08/2017   Aortic atherosclerosis (HCC) 04/22/2016   Permanent atrial fibrillation (HCC) 10/16/2014   Metabolic syndrome 12/03/2013  History of uterine cancer 12/03/2013   Chronic midline low back pain without sciatica 05/23/2013   High risk medication use 05/23/2013   Swelling of limb 05/03/2013   Hyperlipidemia 02/10/2009   Pulmonary hypertension (HCC) 02/10/2009   Atrial fibrillation with RVR (HCC) 02/10/2009    Allergies:  Allergies  Allergen  Reactions   Baclofen     Feels crazy   Morphine Other (See Comments)   Niaspan [Niacin]    Flexeril [Cyclobenzaprine]     Feels crazy   Medications:  Current Outpatient Medications:    levofloxacin (LEVAQUIN) 750 MG tablet, Take 1 tablet (750 mg total) by mouth every other day., Disp: 4 tablet, Rfl: 0   predniSONE (DELTASONE) 20 MG tablet, Take 2 tablets (40 mg total) by mouth daily with breakfast for 5 days., Disp: 10 tablet, Rfl: 0   acetaminophen (TYLENOL) 325 MG tablet, Take 650 mg by mouth every 6 (six) hours as needed for moderate pain., Disp: , Rfl:    acetaminophen-codeine (TYLENOL #2) 300-15 MG tablet, SMARTSIG:0.5 Tablet(s) By Mouth Every 4-6 Hours PRN, Disp: , Rfl:    albuterol (VENTOLIN HFA) 108 (90 Base) MCG/ACT inhaler, INHALE 1-2 PUFFS into THE lungs EVERY 4 TO 6 HOURS AS NEEDED FOR SHORTNESS OF BREATH, Disp: 8.5 g, Rfl: 0   ALPRAZolam (XANAX) 0.25 MG tablet, Take 1 tablet (0.25 mg total) by mouth at bedtime as needed for anxiety., Disp: 30 tablet, Rfl: 2   atorvastatin (LIPITOR) 80 MG tablet, TAKE 1 TABLET BY MOUTH DAILY at 6 pm, Disp: 30 tablet, Rfl: 2   bismuth subsalicylate (PEPTO BISMOL) 262 MG/15ML suspension, Take 30 mLs by mouth every 6 (six) hours as needed., Disp: , Rfl:    cyanocobalamin (VITAMIN B12) 1000 MCG tablet, TAKE ONE TABLET BY MOUTH DAILY WITH BREAKFAST, Disp: 90 tablet, Rfl: 1   diclofenac sodium (VOLTAREN) 1 % GEL, Apply 2 g topically 4 (four) times daily., Disp: 350 g, Rfl: 3   diltiazem (CARDIZEM CD) 180 MG 24 hr capsule, TAKE ONE CAPSULE BY MOUTH DAILY (,,EVENING), Disp: 30 capsule, Rfl: 0   DULoxetine (CYMBALTA) 60 MG capsule, TAKE TWO CAPSULES BY MOUTH EVERY EVENING, Disp: 60 capsule, Rfl: 2   esomeprazole (NEXIUM) 40 MG capsule, TAKE ONE CAPSULE BY MOUTH DAILY, Disp: 90 capsule, Rfl: 1   fluticasone (FLONASE) 50 MCG/ACT nasal spray, USE ONE SPRAY IN EACH NOSTRIL ONCE DAILY. SHAKE GENTLY BEFORE USING., Disp: 16 g, Rfl: 5   guaiFENesin (MUCINEX) 600  MG 12 hr tablet, Take 1 tablet (600 mg total) by mouth 2 (two) times daily as needed., Disp: 60 tablet, Rfl: 1   ketoconazole (NIZORAL) 2 % cream, Apply topically daily., Disp: , Rfl:    levalbuterol (XOPENEX) 0.63 MG/3ML nebulizer solution, INHALE CONTENTS OF 1 VIAL IN NEBULIZER EVERY 6 HOURS AS NEEDED FOR WHEEZING OR SHORTNESS OF BREATH, Disp: 120 mL, Rfl: 5   loratadine (ALLERGY RELIEF) 10 MG tablet, TAKE ONE TABLET BY MOUTH DAILY (,EVENING), Disp: 90 tablet, Rfl: 1   magic mouthwash w/lidocaine SOLN, Take 10 mLs by mouth 3 (three) times daily as needed for mouth pain., Disp: 250 mL, Rfl: 0   Magnesium Hydroxide (DULCOLAX PO), Take 25 mg by mouth daily., Disp: , Rfl:    metoprolol succinate (TOPROL-XL) 50 MG 24 hr tablet, Take with or immediately following a meal., Disp: 90 tablet, Rfl: 1   NON FORMULARY, Inhale 2-4 L into the lungs daily. oxygen, Disp: , Rfl:    nystatin (MYCOSTATIN) 100000 UNIT/ML suspension, Take 5 mLs (  500,000 Units total) by mouth 4 (four) times daily., Disp: 473 mL, Rfl: 0   nystatin cream (MYCOSTATIN), APPLY TO THE AFFECTED AREA(S) TWICE DAILY, Disp: 60 g, Rfl: 0   NYSTATIN powder, APPLY ONE APPLICATION TOPICALLY THREE TIMES DAILY, Disp: 45 g, Rfl: 1   Spacer/Aero-Holding Chambers DEVI, 1 Device by Does not apply route in the morning and at bedtime., Disp: 1 each, Rfl: 1   torsemide (DEMADEX) 20 MG tablet, TAKE 1 TABLET BY MOUTH DAILY, Disp: 30 tablet, Rfl: 0   TRELEGY ELLIPTA 100-62.5-25 MCG/ACT AEPB, INHALE 1 PUFF into THE lungs ONCE DAILY, Disp: 60 each, Rfl: 0   Vitamin D, Ergocalciferol, 50000 units CAPS, Take 50,000 Units by mouth every 7 (seven) days. Mondays, Disp: 12 capsule, Rfl: 2   warfarin (COUMADIN) 5 MG tablet, TAKE 1 OR 2 TABLETS BY MOUTH AS DIRECTED BY [facility] (T1,TABLET DAILY EXCEPT ON TUESDAY TAKE 1 AND 1/2 TABLETS), Disp: 180 tablet, Rfl: 0  Observations/Objective: Patient is well-developed, well-nourished in no acute distress.  Resting  comfortably  at home.  Head is normocephalic, atraumatic.  No labored breathing.  Speech is clear and coherent with logical content.  Patient is alert and oriented at baseline.  Laying in bed Coarse cough  Assessment and Plan: 1. COPD exacerbation (HCC) - levofloxacin (LEVAQUIN) 750 MG tablet; Take 1 tablet (750 mg total) by mouth every other day.  Dispense: 4 tablet; Refill: 0 - predniSONE (DELTASONE) 20 MG tablet; Take 2 tablets (40 mg total) by mouth daily with breakfast for 5 days.  Dispense: 10 tablet; Refill: 0  2. Acute on chronic respiratory failure, unspecified whether with hypoxia or hypercapnia (HCC) - levofloxacin (LEVAQUIN) 750 MG tablet; Take 1 tablet (750 mg total) by mouth every other day.  Dispense: 4 tablet; Refill: 0 - predniSONE (DELTASONE) 20 MG tablet; Take 2 tablets (40 mg total) by mouth daily with breakfast for 5 days.  Dispense: 10 tablet; Refill: 0  CrCL 39, will dose Levaquin every other day - Take meds as prescribed - Use a cool mist humidifier  -Use saline nose sprays frequently -Force fluids -For any cough or congestion  Use plain Mucinex- regular strength or max strength is fine -For fever or aces or pains- take tylenol or ibuprofen. -Throat lozenges if help Keep CT scan appointment  -Follow up if symptoms worsen or do not improve   Follow Up Instructions: I discussed the assessment and treatment plan with the patient. The patient was provided an opportunity to ask questions and all were answered. The patient agreed with the plan and demonstrated an understanding of the instructions.  A copy of instructions were sent to the patient via MyChart unless otherwise noted below.    The patient was advised to call back or seek an in-person evaluation if the symptoms worsen or if the condition fails to improve as anticipated.  Time:  I spent 11 minutes with the patient via telehealth technology discussing the above problems/concerns.    Jannifer Rodney,  FNP

## 2023-03-28 ENCOUNTER — Telehealth: Payer: Self-pay | Admitting: Cardiology

## 2023-03-28 ENCOUNTER — Ambulatory Visit (HOSPITAL_COMMUNITY)
Admission: RE | Admit: 2023-03-28 | Discharge: 2023-03-28 | Disposition: A | Discharge status: Home / Self Care | Payer: 59 | Source: Ambulatory Visit | Attending: Family | Admitting: Family

## 2023-03-28 DIAGNOSIS — J189 Pneumonia, unspecified organism: Secondary | ICD-10-CM | POA: Diagnosis present

## 2023-03-28 NOTE — Telephone Encounter (Signed)
STAT if HR is under 50 or over 120 (normal HR is 60-100 beats per minute)  What is your heart rate?  HR was 143 when patient went to the restroom  Do you have a log of your heart rate readings (document readings)?  HR ranges 140-150 with movement since yesterday. No log available.  Do you have any other symptoms?  SOB, anxiety -- HR is elevated with minimal movement.

## 2023-03-28 NOTE — Telephone Encounter (Signed)
Patient daughter Okey Regal) notified and verbalized understanding.  She request to have sooner f/u if possible before October.  OV scheduled with Sharlene Dory, NP for 04/13/2023 in [place] office.  Daughter will also call her palliative care to see if they can come before October as well.

## 2023-03-28 NOTE — Telephone Encounter (Signed)
Daughter also states that she has been on steroids & using neb more due to pneumonia so this may be contributing to elevation in heart rates as well.

## 2023-03-28 NOTE — Telephone Encounter (Signed)
Follow Up:      Patient is calling back to see what was decided. 

## 2023-03-28 NOTE — Telephone Encounter (Signed)
Spoke with Melinda Collins (DIL) - states HR running 140's - 150's & SOB with minimal activity.  States this occurs with her up walking to bathroom which is with in 10 feet of her bed.  Resting HR is in the 80's.  Currently on Toprol XL 50mg  every day and Diltiazem CD 180mg  daily.  States she does have COPD as well & is scheduled for CT of lungs this afternoon.  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  States she also has NP that could come out to see her as she has palliative care.

## 2023-04-05 ENCOUNTER — Other Ambulatory Visit: Payer: Self-pay | Admitting: Family

## 2023-04-05 DIAGNOSIS — J439 Emphysema, unspecified: Secondary | ICD-10-CM

## 2023-04-05 DIAGNOSIS — F411 Generalized anxiety disorder: Secondary | ICD-10-CM

## 2023-04-06 ENCOUNTER — Telehealth: Payer: Self-pay | Admitting: Nurse Practitioner

## 2023-04-06 NOTE — Telephone Encounter (Signed)
Appointment scheduled with Sharlene Dory, NP for 04/13/2023.  Please advise if should keep next week's visit or push back a bit.

## 2023-04-06 NOTE — Telephone Encounter (Signed)
Noted  

## 2023-04-06 NOTE — Telephone Encounter (Signed)
New Message:      Patient is being discharged from the hospital today. They called for a hospital follow up appointment. She already had an appointment for next wek, this was made before her admission to the hospital. Does she need to keep that appointment and use as her hospital follow up or need an appointment out farther?

## 2023-04-07 ENCOUNTER — Telehealth: Payer: Self-pay

## 2023-04-07 NOTE — Transitions of Care (Post Inpatient/ED Visit) (Signed)
04/07/2023  Name: Melinda Collins MRN: [id number] DOB: [birth year]  Today's TOC FU Call Status: Today's TOC FU Call Status:: Successful TOC FU Call Completed TOC FU Call Complete Date: 04/07/23 Patient's Name and Date of Birth confirmed.  Transition Care Management Follow-up Telephone Call Date of Discharge: 04/06/23 Discharge Facility: Other [facility]) Name of Other ([facility]) Discharge Facility: [facility] Type of Discharge: Inpatient Admission Primary Inpatient Discharge Diagnosis:: Heart failure How have you been since you were released from the hospital?: Better Any questions or concerns?: No  Items Reviewed: Did you receive and understand the discharge instructions provided?: Yes Medications obtained,verified, and reconciled?: Yes (Medications Reviewed) Any new allergies since your discharge?: No Dietary orders reviewed?: Yes Do you have support at home?: Yes People in Home: other relative(s)  Medications Reviewed Today: Medications Reviewed Today     Reviewed by Karena Addison, LPN (Licensed Practical Nurse) on 04/07/23 at 1001  Med List Status: <None>   Medication Order Taking? Sig Documenting Provider Last Dose Status Informant  acetaminophen (TYLENOL) 325 MG tablet [id number] No Take 650 mg by mouth every 6 (six) hours as needed for moderate pain. [provider] Taking Active Family Member  acetaminophen-codeine (TYLENOL #2) 300-15 MG tablet [id number] No SMARTSIG:0.5 Tablet(s) By Mouth Every 4-6 Hours PRN [provider] Taking Active            Med Note (CROSS, ASHLEY F   Tue Sep 06, 2022  2:13 PM) Pallative care does   albuterol (VENTOLIN HFA) 108 (90 Base) MCG/ACT inhaler [id number]  INHALE 1-2 PUFFS into THE lungs EVERY 4 TO 6 HOURS AS NEEDED FOR SHORTNESS OF BREATH Hawks, Christy A, FNP  Active   ALPRAZolam Prudy Feeler) 0.25 MG tablet [id number]  Take 1 tablet (0.25 mg total) by mouth at bedtime as needed for anxiety. Jannifer Rodney A, FNP   Active   atorvastatin (LIPITOR) 80 MG tablet [id number]  TAKE 1 TABLET BY MOUTH DAILY at 6 pm Jannifer Rodney A, FNP  Active   bismuth subsalicylate (PEPTO BISMOL) 262 MG/15ML suspension [id number] No Take 30 mLs by mouth every 6 (six) hours as needed. [provider] Taking Active Family Member  cyanocobalamin (VITAMIN B12) 1000 MCG tablet [id number] No TAKE ONE TABLET BY MOUTH DAILY WITH BREAKFAST Hawks, Christy A, FNP Taking Active   diclofenac sodium (VOLTAREN) 1 % GEL [id number] No Apply 2 g topically 4 (four) times daily. Sonny Masters, FNP Taking Active Family Member  diltiazem (CARDIZEM CD) 180 MG 24 hr capsule [id number]  TAKE ONE CAPSULE BY MOUTH DAILY (,,EVENING) Jannifer Rodney A, FNP  Active   DULoxetine (CYMBALTA) 60 MG capsule [id number]  TAKE TWO CAPSULES BY MOUTH EVERY EVENING Junie Spencer, FNP  Active   esomeprazole (NEXIUM) 40 MG capsule [id number]  TAKE ONE CAPSULE BY MOUTH DAILY Jannifer Rodney A, FNP  Active   fluticasone (FLONASE) 50 MCG/ACT nasal spray [id number] No USE ONE SPRAY IN EACH NOSTRIL ONCE DAILY. SHAKE GENTLY BEFORE USING. Junie Spencer, FNP Taking Active   Fluticasone-Umeclidin-Vilant (TRELEGY ELLIPTA) 100-62.5-25 MCG/ACT AEPB [id number]  INHALE 1 PUFF into THE lungs ONCE DAILY **NEEDS TO BE SEEN BEFORE NEXT REFILL** Hawks, Christy A, FNP  Active   guaiFENesin (MUCINEX) 600 MG 12 hr tablet [id number] No Take 1 tablet (600 mg total) by mouth 2 (two) times daily as needed. Rollene Rotunda, MD Taking Active Family Member  ketoconazole (NIZORAL) 2 % cream [id number] No Apply topically daily. [provider] Taking Active   levalbuterol (XOPENEX) 0.63  MG/3ML nebulizer solution [id number]  INHALE CONTENTS OF 1 VIAL IN NEBULIZER EVERY 6 HOURS AS NEEDED FOR WHEEZING OR SHORTNESS OF BREATH Hawks, Christy A, FNP  Active   levofloxacin (LEVAQUIN) 750 MG tablet [id number]  Take 1 tablet (750 mg total) by mouth every other day. Jannifer Rodney A, FNP  Active   loratadine  (ALLERGY RELIEF) 10 MG tablet [id number] No TAKE ONE TABLET BY MOUTH DAILY (,EVENING) Hawks, Christy A, FNP Taking Active   magic mouthwash w/lidocaine SOLN [id number] No Take 10 mLs by mouth 3 (three) times daily as needed for mouth pain. Junie Spencer, FNP Taking Active   Magnesium Hydroxide (DULCOLAX PO) [id number] No Take 25 mg by mouth daily. [provider] Taking Active Family Member  metoprolol succinate (TOPROL-XL) 50 MG 24 hr tablet [id number] No Take with or immediately following a meal. Junie Spencer, FNP Taking Active   NON FORMULARY [id number] No Inhale 2-4 L into the lungs daily. oxygen [provider] Taking Active Family Member           Med Note Mayford Knife, Dianna Limbo Jun 09, 2021  7:17 PM)    nystatin (MYCOSTATIN) 100000 UNIT/ML suspension [id number] No Take 5 mLs (500,000 Units total) by mouth 4 (four) times daily. Junie Spencer, FNP Taking Active   nystatin cream (MYCOSTATIN) [id number]  APPLY TO THE AFFECTED AREA(S) TWICE DAILY Junie Spencer, FNP  Active   NYSTATIN powder [id number] No APPLY ONE APPLICATION TOPICALLY THREE TIMES DAILY Bennie Pierini, FNP Taking Active   Spacer/Aero-Holding Chambers DEVI [id number] No 1 Device by Does not apply route in the morning and at bedtime. Junie Spencer, FNP Taking Active Family Member  torsemide (DEMADEX) 20 MG tablet [id number]  TAKE 1 TABLET BY MOUTH DAILY Junie Spencer, FNP  Active   Vitamin D, Ergocalciferol, 50000 units CAPS [id number] No Take 50,000 Units by mouth every 7 (seven) days. Mondays Junie Spencer, FNP Taking Active   warfarin (COUMADIN) 5 MG tablet [id number]  TAKE 1 OR 2 TABLETS BY MOUTH AS DIRECTED BY [facility]ARFARIN CLINIC (T1,TABLET DAILY EXCEPT ON TUESDAY TAKE 1 AND 1/2 TABLETS) Junie Spencer, FNP  Active             Home Care and Equipment/Supplies: Were Home Health Services Ordered?: Yes Name of Home Health Agency:: [facility] Has Agency set up a time to come to your home?:  Yes First Home Health Visit Date: 04/07/23 Any new equipment or medical supplies ordered?: Yes Name of Medical supply agency?: unknown Were you able to get the equipment/medical supplies?: No Do you have any questions related to the use of the equipment/supplies?: No  Functional Questionnaire: Do you need assistance with bathing/showering or dressing?: Yes Do you need assistance with meal preparation?: No Do you need assistance with eating?: No Do you have difficulty maintaining continence: Yes Do you need assistance with getting out of bed/getting out of a chair/moving?: Yes Do you have difficulty managing or taking your medications?: No  Follow up appointments reviewed: PCP Follow-up appointment confirmed?: No (no avail appts, sent message to staff schedule) MD Provider Line Number:[phone] Given: No Specialist Hospital Follow-up appointment confirmed?: Yes Date of Specialist follow-up appointment?: 04/13/23 Follow-Up Specialty Provider:: cardio Do you need transportation to your follow-up appointment?: No Do you understand care options if your condition(s) worsen?: Yes-patient verbalized understanding    SIGNATURE Karena Addison, LPN [facility] Nurse Health Advisor Direct Dial [phone]

## 2023-04-10 ENCOUNTER — Telehealth: Payer: Self-pay

## 2023-04-10 NOTE — Patient Outreach (Signed)
error 

## 2023-04-11 ENCOUNTER — Telehealth: Payer: Self-pay | Admitting: *Deleted

## 2023-04-11 NOTE — Transitions of Care (Post Inpatient/ED Visit) (Signed)
04/11/2023  Name: Melinda Collins MRN: [id number] DOB: January 21, 1941  Today's TOC FU Call Status: Today's TOC FU Call Status:: Successful TOC FU Call Completed TOC FU Call Complete Date: 04/11/23 Patient's Name and Date of Birth confirmed.  Transition Care Management Follow-up Telephone Call Date of Discharge: 04/06/23 Discharge Facility: Other [facility]) Name of Other [facility]) Discharge Facility: [facility] Type of Discharge: Inpatient Admission Primary Inpatient Discharge Diagnosis:: Acute Heart Failure exacerbation How have you been since you were released from the hospital?: Better (per caregiver: "She is doing great, no problems.  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  Was able to remove her foley- no problems") Any questions or concerns?: No  Items Reviewed: Did you receive and understand the discharge instructions provided?: Yes (thoroughly reviewed with patient's caregiver who verbalizes good understanding of same) Medications obtained,verified, and reconciled?: Yes (Medications Reviewed) (Full medication reconciliation/ review completed; no concerns or discrepancies identified; confirmed patient obtained/ is taking all newly Rx'd medications as instructed; caregiver manages medications /denies questions/ concerns around medications today) Any new allergies since your discharge?: No Dietary orders reviewed?: Yes Type of Diet Ordered:: "Healthy as possible" Do you have support at home?: Yes People in Home: child(ren), adult Name of Support/Comfort Primary Source: Reports resides with adult son/ daughter-in-law; daught-in-law is a Charity fundraiser; patient requires assistance with all self-care activities; has private duty sitters; supportive family assists with all care as/ if needed/ indicated  Medications Reviewed Today: Medications Reviewed Today     Reviewed by  Michaela Corner, RN (Registered Nurse) on 04/11/23 at 1523  Med List Status: <None>   Medication Order Taking? Sig Documenting Provider Last Dose Status Informant  acetaminophen (TYLENOL) 325 MG tablet [id number] Yes Take 650 mg by mouth every 6 (six) hours as needed for moderate pain. [provider] Taking Active Family Member  acetaminophen-codeine (TYLENOL #2) 300-15 MG tablet [id number] No SMARTSIG:0.5 Tablet(s) By Mouth Every 4-6 Hours PRN  Patient not taking: Reported on 04/11/2023   [provider] Not Taking Active            Med Note (CROSS, ASHLEY F   Tue Sep 06, 2022  2:13 PM) Pallative care does   albuterol (VENTOLIN HFA) 108 (90 Base) MCG/ACT inhaler [id number] Yes INHALE 1-2 PUFFS into THE lungs EVERY 4 TO 6 HOURS AS NEEDED FOR SHORTNESS OF BREATH Hawks, Christy A, FNP Taking Active   ALPRAZolam Prudy Feeler) 0.25 MG tablet [id number] No Take 1 tablet (0.25 mg total) by mouth at bedtime as needed for anxiety.  Patient not taking: Reported on 04/11/2023   Junie Spencer, FNP Not Taking Active   atorvastatin (LIPITOR) 80 MG tablet [id number] Yes TAKE 1 TABLET BY MOUTH DAILY at 6 pm Jannifer Rodney A, FNP Taking Active   bismuth subsalicylate (PEPTO BISMOL) 262 MG/15ML suspension [id number] Yes Take 30 mLs by mouth every 6 (six) hours as needed. [provider] Taking Active Family Member  bumetanide (BUMEX) 2 MG tablet [id number] Yes Take 2 mg by mouth daily. Junie Spencer, FNP Taking Active Family Member           Med Note Michaela Corner   Tue Apr 11, 2023  3:21 PM) 04/11/23: Reports during [facility] call this was prescribed at time of hospital discharge from [facility] on 04/06/23  cyanocobalamin (VITAMIN B12) 1000 MCG tablet [id number]  Yes TAKE ONE TABLET BY MOUTH DAILY WITH BREAKFAST Hawks, Christy A, FNP Taking Active   diclofenac sodium (VOLTAREN) 1 % GEL [id number] Yes Apply 2 g topically 4 (four) times daily. Sonny Masters, FNP Taking Active Family Member  diltiazem  (CARDIZEM CD) 180 MG 24 hr capsule [id number] Yes TAKE ONE CAPSULE BY MOUTH DAILY (,,EVENING) Junie Spencer, FNP Taking Active            Med Note Michaela Corner   Tue Apr 11, 2023  3:16 PM) 04/11/23: Reports during TOC call dose was increased to 240 mg po every day at time of hospital discharge from [facility] on 04/06/23-- reports obtained/ taking as prescribed  DULoxetine (CYMBALTA) 60 MG capsule [id number] Yes TAKE TWO CAPSULES BY MOUTH EVERY EVENING Hawks, Christy A, FNP Taking Active   esomeprazole (NEXIUM) 40 MG capsule [id number] Yes TAKE ONE CAPSULE BY MOUTH DAILY Jannifer Rodney A, FNP Taking Active   fluticasone (FLONASE) 50 MCG/ACT nasal spray [id number] Yes USE ONE SPRAY IN EACH NOSTRIL ONCE DAILY. SHAKE GENTLY BEFORE USING. Junie Spencer, FNP Taking Active   Fluticasone-Umeclidin-Vilant (TRELEGY ELLIPTA) 100-62.5-25 MCG/ACT AEPB [id number] Yes INHALE 1 PUFF into THE lungs ONCE DAILY **NEEDS TO BE SEEN BEFORE NEXT REFILL** Hawks, Christy A, FNP Taking Active   guaiFENesin (MUCINEX) 600 MG 12 hr tablet [id number] Yes Take 1 tablet (600 mg total) by mouth 2 (two) times daily as needed. Rollene Rotunda, MD Taking Active Family Member  ketoconazole (NIZORAL) 2 % cream [id number] Yes Apply topically daily. [provider] Taking Active   levalbuterol Pauline Aus) 0.63 MG/3ML nebulizer solution [id number] Yes INHALE CONTENTS OF 1 VIAL IN NEBULIZER EVERY 6 HOURS AS NEEDED FOR WHEEZING OR SHORTNESS OF BREATH Hawks, Christy A, FNP Taking Active   levofloxacin (LEVAQUIN) 750 MG tablet [id number] No Take 1 tablet (750 mg total) by mouth every other day.  Patient not taking: Reported on 04/11/2023   Junie Spencer, FNP Not Taking Active   loratadine (ALLERGY RELIEF) 10 MG tablet [id number] Yes TAKE ONE TABLET BY MOUTH DAILY (,EVENING) Hawks, Christy A, FNP Taking Active   LORazepam (ATIVAN) 1 MG tablet [id number] Yes Take 1 mg by mouth every 8 (eight) hours. Junie Spencer, FNP Taking Active Family Member            Med Note Michaela Corner   Tue Apr 11, 2023  3:22 PM) 04/11/23: Reports during [facility] call this was prescribed at time of hospital discharge from [facility] on 04/06/23-- x 5 days only   magic mouthwash w/lidocaine SOLN [id number] Yes Take 10 mLs by mouth 3 (three) times daily as needed for mouth pain. Junie Spencer, FNP Taking Active   Magnesium Hydroxide (DULCOLAX PO) [id number] Yes Take 25 mg by mouth daily. [provider] Taking Active Family Member  metoprolol succinate (TOPROL-XL) 50 MG 24 hr tablet [id number] Yes Take with or immediately following a meal. Junie Spencer, FNP Taking Active   NON FORMULARY [id number] Yes Inhale 2-4 L into the lungs daily. oxygen [provider] Taking Active Family Member           Med Note Mayford Knife, Dianna Limbo Jun 09, 2021  7:17 PM)    nystatin (MYCOSTATIN) 100000 UNIT/ML suspension [id number] No Take 5 mLs (500,000 Units total) by mouth 4 (four) times daily.  Patient not taking: Reported on 04/11/2023   Junie Spencer, FNP Not Taking Active   nystatin cream (MYCOSTATIN) [id number] No APPLY TO THE AFFECTED  AREA(S) TWICE DAILY  Patient not taking: Reported on 04/11/2023   Junie Spencer, FNP Not Taking Active   NYSTATIN powder [id number] No APPLY ONE APPLICATION TOPICALLY THREE TIMES DAILY  Patient not taking: Reported on 04/11/2023   Bennie Pierini, FNP Not Taking Active   polyethylene glycol (MIRALAX / GLYCOLAX) 17 g packet [id number] Yes Take 17 g by mouth daily. Junie Spencer, FNP Taking Active Family Member           Med Note Michaela Corner   Tue Apr 11, 2023  3:23 PM) 04/11/23: Reports during [facility] call this was prescribed at time of hospital discharge from [facility] on 04/06/23   Spacer/Aero-Holding Chambers DEVI [id number] Yes 1 Device by Does not apply route in the morning and at bedtime. Junie Spencer, FNP Taking Active Family Member  torsemide (DEMADEX) 20 MG tablet [id number] No TAKE 1 TABLET BY MOUTH DAILY  Patient not  taking: Reported on 04/11/2023   Junie Spencer, FNP Not Taking Active            Med Note Michaela Corner   Tue Apr 11, 2023  3:17 PM) 04/11/23: reports during [facility] call this was discontinued at time of hospital discharge from [facility] on 04/06/23- now taking Bumex   Vitamin D, Ergocalciferol, 50000 units CAPS [id number] Yes Take 50,000 Units by mouth every 7 (seven) days. Mondays Jannifer Rodney A, FNP Taking Active   warfarin (COUMADIN) 5 MG tablet [id number] Yes TAKE 1 OR 2 TABLETS BY MOUTH AS DIRECTED BY WARFARIN CLINIC (T1,TABLET DAILY EXCEPT ON TUESDAY TAKE 1 AND 1/2 TABLETS) Junie Spencer, FNP Taking Active            Home Care and Equipment/Supplies: Were Home Health Services Ordered?: Yes Name of Home Health Agency:: [facility] Has Agency set up a time to come to your home?: Yes First Home Health Visit Date: 04/07/23 Any new equipment or medical supplies ordered?: No (today, caregiver denies that new DME was ordered at time of hospital discharge: reports she has been using home O2 as per baseline at 4 L/[place] continuously) Were you able to get the equipment/medical supplies?:  (N/A) Do you have any questions related to the use of the equipment/supplies?: No  Functional Questionnaire: Do you need assistance with bathing/showering or dressing?: Yes (family and private duty sitters manage all aspects of care activities) Do you need assistance with meal preparation?: Yes (family and private duty sitters manage all aspects of care activities) Do you need assistance with eating?: No Do you have difficulty maintaining continence: Yes (family and private duty sitters manage all aspects of care activities) Do you need assistance with getting out of bed/getting out of a chair/moving?: Yes (family and private duty sitters manage all aspects of care activities) Do you have difficulty managing or taking your medications?: Yes (RN daughter-in-law manages all aspects of medication management)  Follow  up appointments reviewed: PCP Follow-up appointment confirmed?: Yes Date of PCP follow-up appointment?: 04/20/23 Follow-up Provider: PCP provider Specialist Hospital Follow-up appointment confirmed?: Yes Date of Specialist follow-up appointment?: 04/13/23 Follow-Up Specialty Provider:: cardiology provider Do you need transportation to your follow-up appointment?: No Do you understand care options if your condition(s) worsen?: Yes-patient verbalized understanding  SDOH Interventions Today    Flowsheet Row Most Recent Value  SDOH Interventions   Food Insecurity Interventions Intervention Not Indicated  Transportation Interventions Intervention Not Indicated  [daughter-in-law/ son provides all transportation]      TOC Interventions Today  Flowsheet Row Most Recent Value  TOC Interventions   TOC Interventions Discussed/Reviewed TOC Interventions Discussed  [Patient declines need for ongoing/ further care management/ coordination outreach,  no needs identified at time of TOC call today- declines enrollment in 30-day TOC program- declines taking my direct phone number should needs/ concerns arise post-TOC call]      Interventions Today    Flowsheet Row Most Recent Value  Chronic Disease   Chronic disease during today's visit Congestive Heart Failure (CHF)  General Interventions   General Interventions Discussed/Reviewed General Interventions Discussed, Durable Medical Equipment (DME), Doctor Visits  Doctor Visits Discussed/Reviewed Doctor Visits Discussed, PCP, Specialist  Durable Medical Equipment (DME) Oxygen, Harlow Asa  [confirmed with caregiver that patient uses assistive devices on regular basis, at baseline - walker]  PCP/Specialist Visits Compliance with follow-up visit  Exercise Interventions   Exercise Discussed/Reviewed Exercise Discussed  [facility] PT services- confirmed active,  encouraged patient's active participation/ engagement]  Education Interventions    Education Provided Provided Education  Provided Verbal Education On Other  [reinforced safe use of home O2]  Nutrition Interventions   Nutrition Discussed/Reviewed Nutrition Discussed  Pharmacy Interventions   Pharmacy Dicussed/Reviewed Pharmacy Topics Discussed  [Full medication review with updating medication list in EHR per patient report]  Safety Interventions   Safety Discussed/Reviewed Safety Discussed, Fall Risk      Caryl Pina, RN, BSN, CCRN Alumnus RN CM Care Coordination/ Transition of Care- [facility] Care Management [phone]: direct office

## 2023-04-13 ENCOUNTER — Other Ambulatory Visit: Payer: Self-pay | Admitting: Family

## 2023-04-13 ENCOUNTER — Encounter: Payer: Self-pay | Admitting: Nurse Practitioner

## 2023-04-13 ENCOUNTER — Ambulatory Visit: Payer: 59 | Attending: Nurse Practitioner | Admitting: Nurse Practitioner

## 2023-04-13 VITALS — BP 130/70 | HR 91 | Ht 64.0 in | Wt 188.0 lb

## 2023-04-13 DIAGNOSIS — J9611 Chronic respiratory failure with hypoxia: Secondary | ICD-10-CM

## 2023-04-13 DIAGNOSIS — I4821 Permanent atrial fibrillation: Secondary | ICD-10-CM | POA: Diagnosis not present

## 2023-04-13 DIAGNOSIS — F411 Generalized anxiety disorder: Secondary | ICD-10-CM

## 2023-04-13 DIAGNOSIS — N1832 Chronic kidney disease, stage 3b: Secondary | ICD-10-CM

## 2023-04-13 DIAGNOSIS — I5020 Unspecified systolic (congestive) heart failure: Secondary | ICD-10-CM | POA: Diagnosis not present

## 2023-04-13 DIAGNOSIS — I350 Nonrheumatic aortic (valve) stenosis: Secondary | ICD-10-CM

## 2023-04-13 DIAGNOSIS — J449 Chronic obstructive pulmonary disease, unspecified: Secondary | ICD-10-CM

## 2023-04-13 DIAGNOSIS — N179 Acute kidney failure, unspecified: Secondary | ICD-10-CM

## 2023-04-13 MED ORDER — TORSEMIDE 20 MG PO TABS: 20.0000 mg | ORAL_TABLET | Freq: Every day | ORAL | 5 refills | Status: DC

## 2023-04-13 NOTE — Patient Instructions (Addendum)
Medication Instructions:  Your physician has recommended you make the following change in your medication:  Stop BUMEX Start Torsemide 20 Mg Daily  Continue all other medications as prescribed.   Labwork: Recommend BNP, BMET, MAG in 1 week with PCP   Testing/Procedures: None  Follow-Up: Your physician recommends that you schedule a follow-up appointment in: f/u with Dr.McDowell   Any Other Special Instructions Will Be Listed Below (If Applicable).  If you need a refill on your cardiac medications before your next appointment, please call your pharmacy.

## 2023-04-13 NOTE — Progress Notes (Addendum)
Cardiology Office Note:  .   Date:  04/13/2023  ID:  LASHONN BLASE, DOB 11/03/1940, MRN [id number] PCP: Junie Spencer, FNP  [facility] Providers Cardiologist:  Nona Dell, MD    History of Present Illness: .   Timera Hoblit Simao is a 82 y.o. female with a PMH of moderate to severe aortic valve stenosis, permanent A-fib, chronic hypoxic respiratory failure with severe COPD, CKD stage IIIb, who presents today for tachycardia evaluation.  In the past, she has declined workup for TAVR.  Treatment plan has been done conservatively with ultimate anticipation to transition to palliative care.  Last seen by Dr. Diona Browner on October 28, 2022.  Prior to office visit, she had recently hospitalized at [facility] for acute on chronic hypoxic respiratory failure in setting of CAP.  She had some AKI on CKD with creatinine up to 2.66.  During office visit, patient reported chronic dyspnea on exertion, low activity level at baseline.  Patient's daughter-in-law contacted our office earlier this month noting elevated heart rates around 140s to 150s with shortness of breath with minimal activity.  Resting heart rate in 80s.  Was scheduled for CT scan of her chest.  Daughter-in-law is an Charity fundraiser and was requesting medication adjustment as opposed to taking monologue to the ED.  Patient had palliative care also on board and daughter-in-law stated palliative care NP could come to house to do an assessment.  Dr. Diona Browner stated that would be best, recommended to give an additional Toprol-XL 25 mg if blood pressure was stable, then could continue uptitrating from there if this provides better heart rate control and blood pressure tolerates.  It was also noted that she had been on steroids and was using nebulizer due to pneumonia so this was felt to be contributing to her elevated heart rates as well.  Admitted to [facility] 03/2023 for CHF exacerbation.  EF noted to be 35 to 40% on echocardiogram during  admission, moderate to severe aortic valve stenosis.  Torsemide was switched to Bumex.  Chest x-ray showed findings consistent with community-acquired pneumonia, completed antibiotic therapy.  Hospital course also noted by acute on chronic hypoxic respiratory failure, recommended may consider follow-up CT chest to rule out malignancy.  Also hospital course noted by AKI on CKD, creatinine reduced down to 2.04.  Diltiazem increased from 180 mg to 240 mg daily.  Today she presents for office evaluation with her daughter-in-law who is a Charity fundraiser.  Patient is hard of hearing. History provided by daughter in law. Admits to cough, phlegm/congestion in back of throat. Denies any chest pain, worsening shortness of breath, palpitations, syncope, presyncope, dizziness, orthopnea, PND, or significant weight changes, acute bleeding, or claudication. She will be seeing PCP next week. Daughter in law admits that she is showing signs of more fluid build-up, says she may be going to give her an extra demedex later today. Also notices more leg edema.   ROS: Negative. See HPI.   Studies Reviewed: Marland Kitchen    EKG: EKG Interpretation Date/Time:  Thursday April 13 2023 09:47:13 EDT Ventricular Rate:  94 PR Interval:    QRS Duration:  114 QT Interval:  372 QTC Calculation: 465 R Axis:   -76  Text Interpretation: Atrial fibrillation Left axis deviation Incomplete right bundle branch block Inferior infarct (cited on or before 12-Sep-2022) Anterolateral infarct (cited on or before 12-Sep-2022) When compared with ECG of 12-Sep-2022 12:13, Significant changes have occurred Confirmed by Sharlene Dory [phone]) on 04/13/2023 9:50:59 AM   Echo  04/03/2023 at [facility]:  . Summary 1. The left ventricle is normal in size with mildly increased wall thickness. 2. The left ventricular systolic function is moderately decreased, LVEF is visually estimated at 35-40%. 3. There is moderate to severe aortic valve stenosis. 4. Mitral annular  calcification is present (moderate). 5. There is moderate posteriorly directed mitral regurgitation. 6. There is mild to moderate aortic regurgitation. 7. The left atrium is severely dilated in size. 8. The right ventricle is normal in size, with moderately reduced systolic function. 9. There is moderate tricuspid regurgitation. 10. There is moderate pulmonary hypertension. 11. The right atrium is severely dilated in size. 12. IVC size and inspiratory change suggest elevated right atrial pressure. (10-20 mmHg). Left Ventricle The left ventricle is normal in size with mildly increased wall thickness. The left ventricular systolic function is moderately decreased, LVEF is visually estimated at 35-40%. Left ventricular diastolic function cannot be accurately assessed. Right Ventricle The right ventricle is normal in size, with moderately reduced systolic function. Left Atrium The left atrium is severely dilated in size. Right Atrium The right atrium is severely dilated in size. Aortic Valve The aortic valve is trileaflet with moderately thickened leaflets with moderately reduced excursion. There is mild to moderate aortic regurgitation. There is moderate to severe aortic valve stenosis. Peak AV transvalvular velocity: 3.4 m/s. Mean gradient: 24 mmHg. VTI index: 0.2. Estimated aortic valve area (VTI): 0.5 cm2. LVOT diameter: 1.6 cm. LVOT stroke volume index: 13 ml/m2. Mitral Valve The mitral valve leaflets are mildly thickened with reduced leaflet mobility. Mitral annular calcification is present (moderate). There is moderate posteriorly directed mitral regurgitation. There is no mitral stenosis. Tricuspid Valve The tricuspid valve leaflets are normal, with normal leaflet mobility. There is moderate tricuspid regurgitation. There is moderate pulmonary hypertension. TR maximum velocity: 3.5 m/s Estimated PASP: 65 mmHg. Pulmonic Valve Pulmonary valve is not well visualized. There is no significant pulmonic regurgitation.  There is no evidence of a significant transvalvular gradient. Aorta The aorta is normal in size in the visualized segments. Inferior Vena Cava IVC size and inspiratory change suggest elevated right atrial pressure.  Risk Assessment/Calculations:    CHA2DS2-VASc Score = 4   This indicates a 4.8% annual risk of stroke. The patient's score is based upon: CHF History: 1 HTN History: 0 Diabetes History: 0 Stroke History: 0 Vascular Disease History: 0 Age Score: 2 Gender Score: 1  Physical Exam:   VS:  BP 130/70   Pulse 91   Ht 5\' 4"  (1.626 m)   Wt 188 lb (85.3 kg)   SpO2 91% Comment: 4-6L at home 3L current  BMI 32.27 kg/m    Wt Readings from Last 3 Encounters:  04/13/23 188 lb (85.3 kg)  10/28/22 185 lb (83.9 kg)  10/27/22 184 lb (83.5 kg)    GEN: Obese, 82 y.o. female in no acute distress, wearing chronic oxygen and sitting in a wheelchair NECK: No JVD; No carotid bruits CARDIAC: S1/S2, irregularly irregular rhythm, grade 2/6 murmur, no rubs, no gallops RESPIRATORY:  Scattered and diminished wheezing to auscultation bilaterally without rales or rhonchi, congested cough ABDOMEN: Soft, non-tender, rounded abdomen EXTREMITIES:  +1 pitting edema to BLE; No deformity   ASSESSMENT AND PLAN: .    HFrEF Stage C, NYHA class II-III symptoms.  Recently hospitalized with CHF exacerbation. Recent EF at [facility] 35 to 40%.  Weight appears to be up per daughter-in-law's report.  Does have some leg edema, +1 on exam.  Most recent proBNP 3,692.  Compliant with her medications.  Recent AKI on CKD stage IIIb.  Will stop Bumex and switch to torsemide at 20 mg daily as previously prescribed, she was previously using torsemide on a as needed basis and I instructed to take this once a day.  Daughter-in-law recommends to get labs with PCP next week.  I will route my note over to PCP and recommend the following labs be obtained next week: BMET, proBNP, and magnesium.  Continue metoprolol succinate.  Will work on slowly titrating GDMT over time if kidney function and BP allow. Low sodium diet, fluid restriction <2L, and daily weights encouraged. Educated to contact our office for weight gain of 2 lbs overnight or 5 lbs in one week.  Moderate to severe aortic valve stenosis Recent echo at [facility] revealed moderate to severe aortic valve stenosis.  Patient has declined workup for TAVR.  Continue conservative follow-up as planned.  Continue to follow-up with palliative care as planned.  She shows me a DNR paperwork.  She has DNR on file.   Permanent A-fib Denies any tachycardia or palpitations.  Heart rate is 91 bpm today.  Diltiazem dosage was increased during previous hospitalization at [facility].  She is tolerating her medications well.  Will continue heart rate control strategy with Toprol-XL and diltiazem.  Continue Coumadin for stroke prevention.  Chronic hypoxic respiratory failure with severe COPD on chronic oxygen History of recurrent community-acquired pneumonia.  She has congested cough on exam.  She has been treated antibiotic therapy.  Continue oxygen therapy. Continue current medication regimen and continue to follow-up with palliative care.  AKI on CKD stage IIIb Most recent serum creatinine 2.04 with eGFR 24. 1.40-1.70 is around her baseline serum creatinine.  Stopping Bumex and switching to torsemide as mentioned above.  Requesting lab work to be drawn in 1 week at PCPs office-see above.  Avoid nephrotoxic agents.  Continue follow-up with PCP.  Dispo: Daughter lab requests to follow-up with Dr. Diona Browner as scheduled.  Follow-up with Dr. Diona Browner in 1 month as scheduled or follow-up with me sooner if anything changes.  Signed, Sharlene Dory, NP

## 2023-04-14 ENCOUNTER — Ambulatory Visit: Payer: 59

## 2023-04-14 DIAGNOSIS — I130 Hypertensive heart and chronic kidney disease with heart failure and stage 1 through stage 4 chronic kidney disease, or unspecified chronic kidney disease: Secondary | ICD-10-CM

## 2023-04-14 DIAGNOSIS — I5033 Acute on chronic diastolic (congestive) heart failure: Secondary | ICD-10-CM | POA: Diagnosis not present

## 2023-04-14 DIAGNOSIS — I4821 Permanent atrial fibrillation: Secondary | ICD-10-CM

## 2023-04-14 DIAGNOSIS — N179 Acute kidney failure, unspecified: Secondary | ICD-10-CM

## 2023-04-14 DIAGNOSIS — N1832 Chronic kidney disease, stage 3b: Secondary | ICD-10-CM

## 2023-04-14 DIAGNOSIS — I340 Nonrheumatic mitral (valve) insufficiency: Secondary | ICD-10-CM

## 2023-04-14 DIAGNOSIS — J440 Chronic obstructive pulmonary disease with acute lower respiratory infection: Secondary | ICD-10-CM

## 2023-04-14 DIAGNOSIS — J188 Other pneumonia, unspecified organism: Secondary | ICD-10-CM

## 2023-04-20 ENCOUNTER — Ambulatory Visit: Payer: 59 | Admitting: Family

## 2023-04-20 ENCOUNTER — Encounter: Payer: Self-pay | Admitting: Family

## 2023-04-20 VITALS — BP 132/86 | HR 89 | Temp 97.0°F | Ht 64.0 in | Wt 190.8 lb

## 2023-04-20 DIAGNOSIS — J9611 Chronic respiratory failure with hypoxia: Secondary | ICD-10-CM | POA: Diagnosis not present

## 2023-04-20 DIAGNOSIS — I4891 Unspecified atrial fibrillation: Secondary | ICD-10-CM | POA: Diagnosis not present

## 2023-04-20 DIAGNOSIS — R531 Weakness: Secondary | ICD-10-CM

## 2023-04-20 DIAGNOSIS — N184 Chronic kidney disease, stage 4 (severe): Secondary | ICD-10-CM | POA: Insufficient documentation

## 2023-04-20 DIAGNOSIS — Z09 Encounter for follow-up examination after completed treatment for conditions other than malignant neoplasm: Secondary | ICD-10-CM

## 2023-04-20 DIAGNOSIS — I509 Heart failure, unspecified: Secondary | ICD-10-CM | POA: Diagnosis not present

## 2023-04-20 DIAGNOSIS — F411 Generalized anxiety disorder: Secondary | ICD-10-CM

## 2023-04-20 DIAGNOSIS — F410 Panic disorder [episodic paroxysmal anxiety] without agoraphobia: Secondary | ICD-10-CM

## 2023-04-20 MED ORDER — DIAZEPAM 2 MG PO TABS
2.0000 mg | ORAL_TABLET | Freq: Two times a day (BID) | ORAL | 2 refills | Status: DC | PRN
Start: 2023-04-20 — End: 2023-08-14

## 2023-04-20 NOTE — Patient Instructions (Signed)
Heart Failure, Diagnosis  Heart failure is a condition in which the heart has trouble pumping blood. This may mean that the heart cannot pump enough blood out to the body or that the heart does not fill up with enough blood. For some people with heart failure, fluid may back up into the lungs. There may also be swelling (edema) in the lower legs. Heart failure is usually a long-term (chronic) condition. It is important for you to take good care of yourself and follow the treatment plan from your health care provider. Different stages of heart failure have different treatment plans. The stages are: Stage A: At risk for heart failure. Having no symptoms of heart failure, but being at risk for developing heart failure. Stage B: Pre-heart failure. Having no symptoms of heart failure, but having structural changes to the heart that indicate heart failure. Stage C: Symptomatic heart failure. Having symptoms of heart failure in addition to structural changes to the heart that indicate heart failure. Stage D: Advanced heart failure. Having symptoms that interfere with daily life and frequent hospitalizations related to heart failure. What are the causes? This condition may be caused by: High blood pressure (hypertension). Hypertension causes the heart muscle to work harder than normal. Coronary artery disease, or CAD. CAD is the buildup of cholesterol and fat (plaque) in the arteries of the heart. Heart attack, also called myocardial infarction. This injures the heart muscle, making it hard for the heart to pump blood. Abnormal heart valves. The valves do not open and close properly, forcing the heart to pump harder to keep the blood flowing. Heart muscle disease, inflammation, or infection (cardiomyopathy or myocarditis). This is damage to the heart muscle. It can increase the risk of heart failure. Lung disease. The heart works harder when the lungs are not healthy. What increases the risk? The risk  of heart failure increases as a person ages. This condition is also more likely to develop in people who: Are obese. Use tobacco or nicotine products. Abuse alcohol or drugs. Have taken medicines that can damage the heart, such as chemotherapy drugs. Have any of these conditions: Diabetes. Abnormal heart rhythms. Thyroid problems. Low blood counts (anemia). Chronic kidney disease. Have a family history of heart failure. What are the signs or symptoms? Symptoms of this condition include: Shortness of breath with activity, such as when climbing stairs. A cough that does not go away. Swelling of the feet, ankles, legs, or abdomen. Losing or gaining weight for no reason. Trouble breathing when lying flat. Waking from sleep because of the need to sit up and get more air. Rapid heartbeat. Other symptoms may include: Tiredness (fatigue) and loss of energy. Feeling light-headed, dizzy, or close to fainting. Nausea or loss of appetite. Waking up more often during the night to urinate (nocturia). Confusion. How is this diagnosed? This condition is diagnosed based on: Your medical history, symptoms, and a physical exam. Blood tests. Diagnostic tests, which may include: Echocardiogram. Electrocardiogram (ECG). Chest X-ray. Exercise stress test. Cardiac MRI. Cardiac catheterization and angiogram. Radionuclide scans. How is this treated? Treatment for this condition is aimed at managing the symptoms of heart failure. Medicines Treatment may include medicines that: Help lower blood pressure by relaxing (dilating) the blood vessels. These medicines are called ACE inhibitors (angiotensin-converting enzyme), ARBs (angiotensin receptor blockers), or vasodilators. Cause the kidneys to remove salt and water from the blood through urination (diuretics). Improve heart muscle strength and prevent the heart from beating too fast (beta blockers). Increase the   force of the heartbeat  (digoxin). Lower heart rates. Certain diabetes medicines (SGLT-2 inhibitors) may also be used in treatment. Healthy behavior changes Treatment may also include making healthy lifestyle changes, such as: Reaching and staying at a healthy weight. Not using tobacco or nicotine products. Eating heart-healthy foods. Limiting or avoiding alcohol. Stopping the use of illegal drugs. Being physically active. Participating in a cardiac rehabilitation program, which is a treatment program to improve your health and well-being through exercise training, education, and counseling. Other treatments Other treatments may include: Procedures to open blocked arteries or repair damaged valves. Placing a pacemaker to improve heart function (cardiac resynchronization therapy). Placing a device to treat serious abnormal heart rhythms (implantable cardioverter defibrillator, or ICD). Placing a device to improve the pumping ability of the heart (left ventricular assist device, or LVAD). Receiving a healthy heart from a donor (heart transplant). This is done when other treatments have not helped. Follow these instructions at home: Manage other health conditions as told by your health care provider. These may include hypertension, diabetes, thyroid disease, or abnormal heart rhythms. Get ongoing education and support as needed. Learn as much as you can about heart failure. Keep all follow-up visits. This is important. Where to find more information American Heart Association: www.heart.org Centers for Disease Control and Prevention: www.cdc.gov NIH National Institute on Aging: www.nia.nih.gov Summary Heart failure is a condition in which the heart has trouble pumping blood. This condition is commonly caused by high blood pressure and other diseases of the heart and lungs. Symptoms of this condition include shortness of breath, tiredness (fatigue), nausea, and swelling of the feet, ankles, legs, or  abdomen. Treatments for this condition may include medicines, lifestyle changes, and surgery. Manage other health conditions as told by your health care provider. This information is not intended to replace advice given to you by your health care provider. Make sure you discuss any questions you have with your health care provider. Document Revised: 10/12/2021 Document Reviewed: 01/25/2020 Elsevier Patient Education  2024 Elsevier Inc.  

## 2023-04-20 NOTE — Progress Notes (Signed)
Subjective:    Patient ID: Melinda Collins, female    DOB: 1940/07/29, 82 y.o.   MRN: [id number]  Chief Complaint  Patient presents with   Hospitalization Follow-up   Today's visit was for Transitional Care Management.  The patient was discharged from [facility] on 04/06/23 with a primary diagnosis of CHF exacerbation.   Contact with the patient and/or caregiver, by a clinical staff member, was made on 04/11/23 and was documented as a telephone encounter within the EMR.  Through chart review and discussion with the patient I have determined that management of their condition is of moderate complexity.   She saw her Cardiologists last week for CHF, A Fib. She was stopped on Bumex and switched on torsemide 20 mg daily.  When she went to the hospital her weight was 198 lb and when discharged 188 lb.      04/20/2023    1:18 PM 04/13/2023    9:29 AM 10/28/2022    1:14 PM  Last 3 Weights  Weight (lbs) 190 lb 12.8 oz 188 lb 185 lb  Weight (kg) 86.546 kg 85.276 kg 83.915 kg    She has chronic respiratory failure and on 3 L of O2.    She has palliative care and they recommended using Valium 2 mg BID prn. She is having panic attacks and increased SOB.   She has CKD and requesting referral to Nephrologists.  Congestive Heart Failure Presents for follow-up visit. Associated symptoms include edema, fatigue, muscle weakness and shortness of breath. The symptoms have been improving.  Anxiety Presents for follow-up visit. Symptoms include excessive worry, nervous/anxious behavior, restlessness and shortness of breath.        Review of Systems  Constitutional:  Positive for fatigue.  Respiratory:  Positive for shortness of breath.   Musculoskeletal:  Positive for muscle weakness.  Psychiatric/Behavioral:  The patient is nervous/anxious.   All other systems reviewed and are negative.  Family History  Problem Relation Age of Onset   Heart disease Mother    Stroke Mother    Diabetes  Mother    Heart attack Father    Kidney disease Father    Cancer Father        prostate   Cancer Brother    Heart attack Brother 79   Cancer Brother        prostate   Heart disease Brother        CHF   COPD Brother    Coronary artery disease Other        fhx   Social History   Socioeconomic History   Marital status: Married    Spouse name: Not on file   Number of children: 1   Years of education: Not on file   Highest education level: Not on file  Occupational History   Occupation: retired  Tobacco Use   Smoking status: Former    Current packs/day: 1.00    Average packs/day: 1 pack/day for 62.8 years (62.8 ttl pk-yrs)    Types: Cigarettes    Start date: 07/18/1960   Smokeless tobacco: Never   Tobacco comments:    Qut last week.  08/10/16 restarted smoking cigarettes  Vaping Use   Vaping status: Never Used  Substance and Sexual Activity   Alcohol use: No    Alcohol/week: 0.0 standard drinks of alcohol   Drug use: No   Sexual activity: Not on file  Other Topics Concern   Not on file  Social History Narrative   Single;  disabled.    Son lives near and daughter in law is Charity fundraiser - they help her daily   Social Determinants of Health   Financial Resource Strain: Low Risk  (04/01/2023)   Received from [facility]   Overall Financial Resource Strain (CARDIA)    Difficulty of Paying Living Expenses: Not hard at all  Food Insecurity: No Food Insecurity (04/11/2023)   Hunger Vital Sign    Worried About Running Out of Food in the Last Year: Never true    Ran Out of Food in the Last Year: Never true  Transportation Needs: No Transportation Needs (04/11/2023)   PRAPARE - Administrator, Civil Service (Medical): No    Lack of Transportation (Non-Medical): No  Physical Activity: Inactive (10/17/2022)   Received from [facility], [facility]   Exercise Vital Sign    Days of Exercise per Week: 0 days    Minutes of Exercise per Session: 0 min  Stress: No  Stress Concern Present (10/17/2022)   Received from [facility], [facility] of Occupational Health - Occupational Stress Questionnaire    Feeling of Stress : Only a little  Social Connections: Socially Isolated (10/17/2022)   Received from [facility], [facility]   Social Connection and Isolation Panel [NHANES]    Frequency of Communication with Friends and Family: Never    Frequency of Social Gatherings with Friends and Family: Never    Attends Religious Services: Never    Database administrator or Organizations: No    Attends Banker Meetings: Never    Marital Status: Never married       Objective:   Physical Exam Vitals reviewed.  Constitutional:      General: She is not in acute distress.    Appearance: She is well-developed. She is obese.  HENT:     Head: Normocephalic and atraumatic.     Right Ear: Tympanic membrane normal.     Left Ear: Tympanic membrane normal.  Eyes:     Pupils: Pupils are equal, round, and reactive to light.  Neck:     Thyroid: No thyromegaly.  Cardiovascular:     Rate and Rhythm: Normal rate and regular rhythm.     Heart sounds: Normal heart sounds. No murmur heard. Pulmonary:     Effort: Pulmonary effort is normal. No respiratory distress.     Breath sounds: Normal breath sounds. No wheezing.  Abdominal:     General: Bowel sounds are normal. There is no distension.     Palpations: Abdomen is soft.     Tenderness: There is no abdominal tenderness.  Musculoskeletal:        General: No tenderness. Normal range of motion.     Cervical back: Normal range of motion and neck supple.     Right lower leg: Edema (trace) present.     Left lower leg: Left lower leg edema: trace.  Skin:    General: Skin is warm and dry.  Neurological:     Mental Status: She is alert and oriented to person, place, and time.     Cranial Nerves: No cranial nerve deficit.     Deep Tendon Reflexes: Reflexes are normal and  symmetric.  Psychiatric:        Behavior: Behavior normal.        Thought Content: Thought content normal.        Judgment: Judgment normal.  BP 132/86   Pulse 89   Temp (!) 97 F (36.1 C) (Temporal)   Ht 5\' 4"  (1.626 m)   Wt 190 lb 12.8 oz (86.5 kg)   SpO2 94%   BMI 32.75 kg/m      Assessment & Plan:  Melinda Collins comes in today with chief complaint of Hospitalization Follow-up   Diagnosis and orders addressed:  1. Atrial fibrillation with RVR (HCC) - CBC with Differential/Platelet - CMP14+EGFR  2. Chronic respiratory failure with hypoxia (HCC) - CBC with Differential/Platelet - CMP14+EGFR  3. GAD (generalized anxiety disorder) - diazepam (VALIUM) 2 MG tablet; Take 1 tablet (2 mg total) by mouth every 12 (twelve) hours as needed for anxiety.  Dispense: 60 tablet; Refill: 2 - CBC with Differential/Platelet - CMP14+EGFR  4. Weakness - For home use only DME Other see comment - CBC with Differential/Platelet - CMP14+EGFR  5. Chronic kidney disease, stage 4 (severe) (HCC) - CBC with Differential/Platelet - CMP14+EGFR - Ambulatory referral to Nephrology  6. Panic attack - diazepam (VALIUM) 2 MG tablet; Take 1 tablet (2 mg total) by mouth every 12 (twelve) hours as needed for anxiety.  Dispense: 60 tablet; Refill: 2 - CBC with Differential/Platelet - CMP14+EGFR  7. Chronic congestive heart failure, unspecified heart failure type (HCC) - Brain natriuretic peptide   Labs pending Continue current medications  Will give Valium as needed, do not take with Ativan Sedation precautions discussed  She is under palliative care this was recommend by palliative.  Keep follow up with Cardiologists  Will place referral to nephrologists per family request Health Maintenance reviewed Diet and exercise encouraged  Follow up plan: 3 months   Jannifer Rodney, FNP

## 2023-04-21 LAB — CBC WITH DIFFERENTIAL/PLATELET
Basophils Absolute: 0 10*3/uL (ref 0.0–0.2)
Basos: 1 %
EOS (ABSOLUTE): 0.3 10*3/uL (ref 0.0–0.4)
Eos: 4 %
Hematocrit: 32.8 % — ABNORMAL LOW (ref 34.0–46.6)
Hemoglobin: 9.9 g/dL — ABNORMAL LOW (ref 11.1–15.9)
Immature Grans (Abs): 0 10*3/uL (ref 0.0–0.1)
Immature Granulocytes: 1 %
Lymphocytes Absolute: 1.1 10*3/uL (ref 0.7–3.1)
Lymphs: 15 %
MCH: 29 pg (ref 26.6–33.0)
MCHC: 30.2 g/dL — ABNORMAL LOW (ref 31.5–35.7)
MCV: 96 fL (ref 79–97)
Monocytes Absolute: 0.7 10*3/uL (ref 0.1–0.9)
Monocytes: 10 %
Neutrophils Absolute: 5 10*3/uL (ref 1.4–7.0)
Neutrophils: 69 %
Platelets: 239 10*3/uL (ref 150–450)
RBC: 3.41 x10E6/uL — ABNORMAL LOW (ref 3.77–5.28)
RDW: 15.4 % (ref 11.7–15.4)
WBC: 7.1 10*3/uL (ref 3.4–10.8)

## 2023-04-21 LAB — CMP14+EGFR
ALT: 13 [IU]/L (ref 0–32)
AST: 29 [IU]/L (ref 0–40)
Albumin: 3.4 g/dL — ABNORMAL LOW (ref 3.8–4.8)
Alkaline Phosphatase: 98 [IU]/L (ref 44–121)
BUN/Creatinine Ratio: 21 (ref 12–28)
BUN: 38 mg/dL — ABNORMAL HIGH (ref 8–27)
Bilirubin Total: 0.3 mg/dL (ref 0.0–1.2)
CO2: 27 mmol/L (ref 20–29)
Calcium: 8.5 mg/dL — ABNORMAL LOW (ref 8.7–10.3)
Chloride: 104 mmol/L (ref 96–106)
Creatinine, Ser: 1.84 mg/dL — ABNORMAL HIGH (ref 0.57–1.00)
Globulin, Total: 2.1 g/dL (ref 1.5–4.5)
Glucose: 88 mg/dL (ref 70–99)
Potassium: 5.1 mmol/L (ref 3.5–5.2)
Sodium: 144 mmol/L (ref 134–144)
Total Protein: 5.5 g/dL — ABNORMAL LOW (ref 6.0–8.5)
eGFR: 27 mL/min/{1.73_m2} — ABNORMAL LOW (ref >59)

## 2023-04-21 LAB — BRAIN NATRIURETIC PEPTIDE: BNP: 207.4 pg/mL — ABNORMAL HIGH (ref 0.0–100.0)

## 2023-05-04 ENCOUNTER — Other Ambulatory Visit: Payer: Self-pay | Admitting: Family

## 2023-05-04 ENCOUNTER — Ambulatory Visit: Payer: 59 | Admitting: Cardiology

## 2023-05-04 DIAGNOSIS — G8929 Other chronic pain: Secondary | ICD-10-CM

## 2023-05-04 DIAGNOSIS — J439 Emphysema, unspecified: Secondary | ICD-10-CM

## 2023-05-04 DIAGNOSIS — F339 Major depressive disorder, recurrent, unspecified: Secondary | ICD-10-CM

## 2023-05-04 DIAGNOSIS — M4716 Other spondylosis with myelopathy, lumbar region: Secondary | ICD-10-CM

## 2023-05-04 DIAGNOSIS — E782 Mixed hyperlipidemia: Secondary | ICD-10-CM

## 2023-05-15 ENCOUNTER — Ambulatory Visit: Payer: 59 | Admitting: Cardiology

## 2023-07-03 ENCOUNTER — Other Ambulatory Visit: Payer: Self-pay | Admitting: Family

## 2023-07-04 ENCOUNTER — Other Ambulatory Visit (HOSPITAL_COMMUNITY): Payer: Self-pay | Admitting: Nephrology

## 2023-07-04 DIAGNOSIS — D638 Anemia in other chronic diseases classified elsewhere: Secondary | ICD-10-CM

## 2023-07-04 DIAGNOSIS — N184 Chronic kidney disease, stage 4 (severe): Secondary | ICD-10-CM

## 2023-07-13 ENCOUNTER — Other Ambulatory Visit: Payer: Self-pay | Admitting: Family Medicine

## 2023-07-24 ENCOUNTER — Ambulatory Visit (INDEPENDENT_AMBULATORY_CARE_PROVIDER_SITE_OTHER): Payer: 59

## 2023-07-24 VITALS — Ht 64.0 in | Wt 190.0 lb

## 2023-07-24 DIAGNOSIS — Z0000 Encounter for general adult medical examination without abnormal findings: Secondary | ICD-10-CM | POA: Diagnosis not present

## 2023-07-24 NOTE — Patient Instructions (Signed)
 Melinda Collins , Thank you for taking time to come for your Medicare Wellness Visit. I appreciate your ongoing commitment to your health goals. Please review the following plan we discussed and let me know if I can assist you in the future.   Referrals/Orders/Follow-Ups/Clinician Recommendations: Aim for 30 minutes of exercise or brisk walking, 6-8 glasses of water, and 5 servings of fruits and vegetables each day.  This is a list of the screening recommended for you and due dates:  Health Maintenance  Topic Date Due   Zoster (Shingles) Vaccine (1 of 2) 07[birth year]04[birth year]1961   COVID-19 Vaccine (4 - 2024-25 season) 03/19/2023   Flu Shot  10/16/2023*   DEXA scan (bone density measurement)  10/27/2023*   DTaP/Tdap/Td vaccine (2 - Td or Tdap) 08/01/2024   Pneumonia Vaccine  Completed   HPV Vaccine  Aged Out   Hepatitis C Screening  Discontinued  *Topic was postponed. The date shown is not the original due date.    Advanced directives: (In Chart) A copy of your advanced directives are scanned into your chart should your provider ever need it.  Next Medicare Annual Wellness Visit scheduled for next year: Yes

## 2023-07-24 NOTE — Progress Notes (Signed)
 Subjective:   Melinda Collins is a 83 y.o. female who presents for Medicare Annual (Subsequent) preventive examination.  Visit Complete: Virtual I connected with  Melinda Collins on 07/24/23 by a video and audio enabled telemedicine application and verified that I am speaking with the correct person using two identifiers.  Patient Location: Home  Provider Location: Home Office  I discussed the limitations of evaluation and management by telemedicine. The patient expressed understanding and agreed to proceed.  Vital Signs: Because this visit was a virtual/telehealth visit, some criteria may be missing or patient reported. Any vitals not documented were not able to be obtained and vitals that have been documented are patient reported.  Cardiac Risk Factors include: advanced age (>47men, >65 women);dyslipidemia;hypertension;sedentary lifestyle     Objective:    Today's Vitals   07/24/23 1147  Weight: 190 lb (86.2 kg)  Height: 5' 4 (1.626 m)   Body mass index is 32.61 kg/m.     07/24/2023   11:56 AM 04/11/2022    2:44 PM 06/10/2021   12:26 AM 04/25/2021    3:14 PM 04/08/2021    3:50 PM 09/15/2020    6:14 PM 08/25/2014   10:50 AM  Advanced Directives  Does Patient Have a Medical Advance Directive? Yes No No No No No Yes  Type of Estate Agent of [place];Living will        Does patient want to make changes to medical advance directive? No - Patient declined        Copy of Healthcare Power of Attorney in Chart? Yes - validated most recent copy scanned in chart (See row information)      Yes  Would patient like information on creating a medical advance directive?  No - Patient declined No - Patient declined No - Patient declined No - Patient declined      Current Medications (verified) Outpatient Encounter Medications as of 07/24/2023  Medication Sig   acetaminophen -codeine  (TYLENOL  #2) 300-15 MG tablet    albuterol  (VENTOLIN  HFA) 108 (90 Base) MCG/ACT  inhaler INHALE 1-2 PUFFS into THE lungs EVERY 4 TO 6 HOURS AS NEEDED FOR SHORTNESS OF BREATH   atorvastatin  (LIPITOR) 80 MG tablet TAKE 1 TABLET BY MOUTH EVERY EVENING AT 6pm   diazepam  (VALIUM ) 2 MG tablet Take 1 tablet (2 mg total) by mouth every 12 (twelve) hours as needed for anxiety.   diltiazem  (CARDIZEM  CD) 240 MG 24 hr capsule Take 1 capsule by mouth daily.   DULoxetine  (CYMBALTA ) 60 MG capsule TAKE TWO CAPSULES BY MOUTH EVERY EVENING   fluticasone  (FLONASE ) 50 MCG/ACT nasal spray USE ONE SPRAY IN EACH NOSTRIL ONCE DAILY. SHAKE GENTLY BEFORE USING.   Fluticasone -Umeclidin-Vilant (TRELEGY ELLIPTA ) 100-62.5-25 MCG/ACT AEPB INHALE 1 PUFF into THE lungs ONCE DAILY   guaiFENesin  (MUCINEX ) 600 MG 12 hr tablet Take 1 tablet (600 mg total) by mouth 2 (two) times daily as needed.   levalbuterol  (XOPENEX ) 1.25 MG/3ML nebulizer solution Inhale 1.25 mg into the lungs every 6 (six) hours as needed for shortness of breath or wheezing.   loratadine  (ALLERGY RELIEF) 10 MG tablet TAKE ONE TABLET BY MOUTH DAILY (,EVENING)   metoprolol  succinate (TOPROL -XL) 50 MG 24 hr tablet TAKE 1 TABLET BY MOUTH EVERY MORNING, with OR immediatlely following a meal   polyethylene glycol (MIRALAX  / GLYCOLAX ) 17 g packet Take 17 g by mouth daily.   torsemide  (DEMADEX ) 20 MG tablet Take 1 tablet (20 mg total) by mouth daily.   Vitamin D , Ergocalciferol ,  50000 units CAPS Take 50,000 Units by mouth every 7 (seven) days. Mondays   warfarin (COUMADIN ) 5 MG tablet TAKE 1 OR 2 TABLETS BY MOUTH AS DIRECTED BY WARFARIN CLINIC (T1,TABLET DAILY EXCEPT ON TUESDAY TAKE 1 AND 1/2 TABLETS)   LORazepam  (ATIVAN ) 1 MG tablet Take 1 mg by mouth every 8 (eight) hours. (Patient not taking: Reported on 07/24/2023)   No facility-administered encounter medications on file as of 07/24/2023.    Allergies (verified) Baclofen, Morphine , Niaspan [niacin], and Flexeril  [cyclobenzaprine ]   History: Past Medical History:  Diagnosis Date   Aortic  insufficiency    Aortic stenosis    Arthritis    Chronic hypoxemic respiratory failure (HCC)    Chronic kidney disease, stage 3a (HCC)    COPD (chronic obstructive pulmonary disease) (HCC)    Depression    HOH (hard of hearing)    Mitral regurgitation    Mixed hyperlipidemia    Permanent atrial fibrillation (HCC)    Pulmonary hypertension (HCC)    Ruptured disk    Tricuspid regurgitation    Uterine cancer [facility])    Past Surgical History:  Procedure Laterality Date   ABDOMINAL HYSTERECTOMY     total   CATARACT EXTRACTION W/PHACO Left 08/25/2014   Procedure: CATARACT EXTRACTION PHACO AND INTRAOCULAR LENS PLACEMENT (IOC);  Surgeon: Cherene Mania, MD;  Location: [facility];  Service: Ophthalmology;  Laterality: Left;  CDE 6.95   CATARACT EXTRACTION W/PHACO Right 09/11/2014   Procedure: CATARACT EXTRACTION PHACO AND INTRAOCULAR LENS PLACEMENT (IOC);  Surgeon: Cherene Mania, MD;  Location: [facility];  Service: Ophthalmology;  Laterality: Right;  CDE:8.11   CERVICAL DISC SURGERY     x2   KYPHOPLASTY     SPINE SURGERY  2013   cervical fusion   Family History  Problem Relation Age of Onset   Heart disease Mother    Stroke Mother    Diabetes Mother    Heart attack Father    Kidney disease Father    Cancer Father        prostate   Cancer Brother    Heart attack Brother 65   Cancer Brother        prostate   Heart disease Brother        CHF   COPD Brother    Coronary artery disease Other        fhx   Social History   Socioeconomic History   Marital status: Married    Spouse name: Not on file   Number of children: 1   Years of education: Not on file   Highest education level: Not on file  Occupational History   Occupation: retired  Tobacco Use   Smoking status: Former    Current packs/day: 1.00    Average packs/day: 1 pack/day for 63.0 years (63.0 ttl pk-yrs)    Types: Cigarettes    Start date: 07/18/1960   Smokeless tobacco: Never   Tobacco comments:    Qut last week.  08/10/16  restarted smoking cigarettes  Vaping Use   Vaping status: Never Used  Substance and Sexual Activity   Alcohol use: No    Alcohol/week: 0.0 standard drinks of alcohol   Drug use: No   Sexual activity: Not on file  Other Topics Concern   Not on file  Social History Narrative   Single; disabled.    Son lives near and daughter in law is [employer] - they help her daily   Social Drivers of Health   Financial Resource Strain:  Low Risk  (07/24/2023)   Overall Financial Resource Strain (CARDIA)    Difficulty of Paying Living Expenses: Not hard at all  Food Insecurity: No Food Insecurity (07/24/2023)   Hunger Vital Sign    Worried About Running Out of Food in the Last Year: Never true    Ran Out of Food in the Last Year: Never true  Transportation Needs: No Transportation Needs (07/24/2023)   PRAPARE - Administrator, Civil Service (Medical): No    Lack of Transportation (Non-Medical): No  Physical Activity: Inactive (07/24/2023)   Exercise Vital Sign    Days of Exercise per Week: 0 days    Minutes of Exercise per Session: 0 min  Stress: No Stress Concern Present (07/24/2023)   [employer] of Occupational Health - Occupational Stress Questionnaire    Feeling of Stress : Only a little  Social Connections: Socially Isolated (07/24/2023)   Social Connection and Isolation Panel [NHANES]    Frequency of Communication with Friends and Family: More than three times a week    Frequency of Social Gatherings with Friends and Family: Three times a week    Attends Religious Services: Never    Active Member of Clubs or Organizations: No    Attends Banker Meetings: Never    Marital Status: Never married    Tobacco Counseling Counseling given: Not Answered Tobacco comments: Qut last week.  08/10/16 restarted smoking cigarettes   Clinical Intake:  Pre-visit preparation completed: Yes  Pain : No/denies pain     Diabetes: No  How often do you need to have someone help you  when you read instructions, pamphlets, or other written materials from your doctor or pharmacy?: 1 - Never  Interpreter Needed?: No  Comments: Assisted with visit by daughter in law Information entered by :: Charmaine Bloodgood LPN   Activities of Daily Living    07/24/2023   11:56 AM  In your present state of health, do you have any difficulty performing the following activities:  Hearing? 0  Vision? 0  Difficulty concentrating or making decisions? 0  Walking or climbing stairs? 1  Dressing or bathing? 1  Doing errands, shopping? 1  Preparing Food and eating ? N  Using the Toilet? N  In the past six months, have you accidently leaked urine? Y  Do you have problems with loss of bowel control? N  Managing your Medications? Y  Managing your Finances? Y  Housekeeping or managing your Housekeeping? Y    Patient Care Team: Lavell Bari LABOR, FNP as PCP - General (Family Medicine) Debera Jayson MATSU, MD as PCP - Cardiology (Cardiology) Rachele Gaynell RAMAN, MD as Consulting Physician (Nephrology) [facility]  Indicate any recent Medical Services you may have received from other than [facility] providers in the past year (date may be approximate).     Assessment:   This is a routine wellness examination for Abisai .  Hearing/Vision screen Hearing Screening - Comments:: Some hearing loss  Vision Screening - Comments:: No vision problems; will schedule routine eye exam soon     Goals Addressed             This Visit's Progress    COMPLETED: Patient Stated       04/11/2022 AWV Goal: Fall Prevention  Over the next year, patient will decrease their risk for falls by: Using assistive devices, such as a cane or walker, as needed Identifying fall risks within their home and correcting them by: Removing throw rugs Adding handrails  to stairs or ramps Removing clutter and keeping a clear pathway throughout the home Increasing light, especially at night Adding shower  handles/bars Raising toilet seat Identifying potential personal risk factors for falls: Medication side effects Incontinence/urgency Vestibular dysfunction Hearing loss Musculoskeletal disorders Neurological disorders Orthostatic hypotension        Depression Screen    07/24/2023   11:54 AM 10/27/2022    2:22 PM 09/06/2022    2:35 PM 01/24/2022   10:40 AM 08/25/2021   11:26 AM 04/08/2021    3:37 PM 12/28/2020    9:21 AM  PHQ 2/9 Scores  PHQ - 2 Score  4 6 5 1 2  0  PHQ- 9 Score  12 19 18 2 9    Exception Documentation Patient refusal          Fall Risk    07/24/2023   11:55 AM 10/27/2022    2:24 PM 04/11/2022    2:49 PM 01/24/2022   10:43 AM 08/25/2021   11:25 AM  Fall Risk   Falls in the past year? 1 1 1 1 1   Number falls in past yr: 1 1 1 1 1   Injury with Fall? 0 1 0 0 0  Risk for fall due to : History of fall(s);Impaired balance/gait;Impaired mobility History of fall(s) History of fall(s) Impaired mobility;Impaired balance/gait History of fall(s)  Follow up Falls prevention discussed;Education provided;Falls evaluation completed  Falls evaluation completed Falls prevention discussed Education provided    MEDICARE RISK AT HOME: Medicare Risk at Home Any stairs in or around the home?: No If so, are there any without handrails?: No Home free of loose throw rugs in walkways, pet beds, electrical cords, etc?: Yes Adequate lighting in your home to reduce risk of falls?: Yes Life alert?: No Use of a cane, walker or w/c?: Yes Grab bars in the bathroom?: Yes Shower chair or bench in shower?: Yes Elevated toilet seat or a handicapped toilet?: Yes  TIMED UP AND GO:  Was the test performed?  No    Cognitive Function:    08/01/2014   11:45 AM  MMSE - Mini Mental State Exam  Orientation to time 0  Orientation to Place 0  Registration 0  Attention/ Calculation 0  Recall 0  Language- name 2 objects 0  Language- repeat 0  Language- follow 3 step command 0  Language- read &  follow direction 0  Write a sentence 0  Copy design 0  Total score 0        07/24/2023   11:56 AM 04/11/2022    2:46 PM 04/08/2021    3:39 PM  6CIT Screen  What Year? 0 points 0 points 0 points  What month? 0 points 0 points 0 points  What time? 0 points 0 points 0 points  Count back from 20 0 points 0 points 0 points  Months in reverse 2 points 4 points 4 points  Repeat phrase 4 points 2 points 6 points  Total Score 6 points 6 points 10 points    Immunizations Immunization History  Administered Date(s) Administered   Influenza, High Dose Seasonal PF 05/18/2016, 05/01/2017, 05/07/2018   Influenza,inj,Quad PF,6+ Mos 04/09/2013, 04/14/2014, 05/06/2015, 05/10/2019   Influenza-Unspecified 06/17/2020   Moderna Sars-Covid-2 Vaccination 09/12/2019, 10/08/2019, 07/21/2020   Pneumococcal Conjugate-13 07/22/2013   Pneumococcal Polysaccharide-23 05/20/2019   Tdap 08/01/2014   Zoster, Live 08/05/2013    TDAP status: Up to date  Flu Vaccine status: Declined, Education has been provided regarding the importance of this vaccine but patient  still declined. Advised may receive this vaccine at local pharmacy or Health Dept. Aware to provide a copy of the vaccination record if obtained from local pharmacy or Health Dept. Verbalized acceptance and understanding.  Pneumococcal vaccine status: Up to date  Covid-19 vaccine status: Information provided on how to obtain vaccines.   Qualifies for Shingles Vaccine? Yes   Zostavax completed Yes   Shingrix Completed?: No.    Education has been provided regarding the importance of this vaccine. Patient has been advised to call insurance company to determine out of pocket expense if they have not yet received this vaccine. Advised may also receive vaccine at local pharmacy or Health Dept. Verbalized acceptance and understanding.  Screening Tests Health Maintenance  Topic Date Due   Zoster Vaccines- Shingrix (1 of 2) 07/04/1960   COVID-19 Vaccine (4 -  2024-25 season) 03/19/2023   INFLUENZA VACCINE  10/16/2023 (Originally 02/16/2023)   DEXA SCAN  10/27/2023 (Originally 07/05/2007)   DTaP/Tdap/Td (2 - Td or Tdap) 08/01/2024   Pneumonia Vaccine 2+ Years old  Completed   HPV VACCINES  Aged Out   Hepatitis C Screening  Discontinued    Health Maintenance  Health Maintenance Due  Topic Date Due   Zoster Vaccines- Shingrix (1 of 2) [birth year]04/1960   COVID-19 Vaccine (4 - 2024-25 season) 03/19/2023    Colorectal cancer screening: No longer required.   Mammogram status: No longer required due to age and preference .  Bone Density status:  Patient declines   Lung Cancer Screening: (Low Dose CT Chest recommended if Age 22-80 years, 20 pack-year currently smoking OR have quit w/in 15years.) does not qualify.   Lung Cancer Screening Referral: n/a  Additional Screening:  Hepatitis C Screening: does not qualify  Vision Screening: Recommended annual ophthalmology exams for early detection of glaucoma and other disorders of the eye. Is the patient up to date with their annual eye exam?  No  Who is the provider or what is the name of the office in which the patient attends annual eye exams? none If pt is not established with a provider, would they like to be referred to a provider to establish care? No .   Dental Screening: Recommended annual dental exams for proper oral hygiene  Community Resource Referral / Chronic Care Management: CRR required this visit?  No   CCM required this visit?  No     Plan:     I have personally reviewed and noted the following in the patient's chart:   Medical and social history Use of alcohol, tobacco or illicit drugs  Current medications and supplements including opioid prescriptions. Patient is not currently taking opioid prescriptions. Functional ability and status Nutritional status Physical activity Advanced directives List of other physicians Hospitalizations, surgeries, and ER visits in  previous 12 months Vitals Screenings to include cognitive, depression, and falls Referrals and appointments  In addition, I have reviewed and discussed with patient certain preventive protocols, quality metrics, and best practice recommendations. A written personalized care plan for preventive services as well as general preventive health recommendations were provided to patient.     Lavelle Pfeiffer Theresa, [place]   02/17/7973   After Visit Summary: (MyChart) Due to this being a telephonic visit, the after visit summary with patients personalized plan was offered to patient via MyChart   Nurse Notes: See routing notes for patient/family concerns

## 2023-07-25 ENCOUNTER — Other Ambulatory Visit: Payer: Self-pay | Admitting: Family

## 2023-07-25 MED ORDER — FLUCONAZOLE 150 MG PO TABS
150.0000 mg | ORAL_TABLET | ORAL | 0 refills | Status: DC | PRN
Start: 2023-07-25 — End: 2023-08-21

## 2023-07-25 MED ORDER — NYSTATIN 100000 UNIT/GM EX CREA: 1.0000 | TOPICAL_CREAM | Freq: Two times a day (BID) | CUTANEOUS | 2 refills | Status: DC

## 2023-07-25 MED ORDER — NYSTATIN 100000 UNIT/GM EX POWD: 1.0000 | Freq: Three times a day (TID) | CUTANEOUS | 0 refills | Status: DC

## 2023-08-02 ENCOUNTER — Other Ambulatory Visit: Payer: Self-pay | Admitting: Family

## 2023-08-02 DIAGNOSIS — F339 Major depressive disorder, recurrent, unspecified: Secondary | ICD-10-CM

## 2023-08-02 DIAGNOSIS — E782 Mixed hyperlipidemia: Secondary | ICD-10-CM

## 2023-08-02 DIAGNOSIS — M4716 Other spondylosis with myelopathy, lumbar region: Secondary | ICD-10-CM

## 2023-08-02 DIAGNOSIS — M545 Low back pain, unspecified: Secondary | ICD-10-CM

## 2023-08-02 DIAGNOSIS — F411 Generalized anxiety disorder: Secondary | ICD-10-CM

## 2023-08-02 DIAGNOSIS — J439 Emphysema, unspecified: Secondary | ICD-10-CM

## 2023-08-02 DIAGNOSIS — F410 Panic disorder [episodic paroxysmal anxiety] without agoraphobia: Secondary | ICD-10-CM

## 2023-08-04 ENCOUNTER — Encounter (HOSPITAL_COMMUNITY): Payer: Self-pay

## 2023-08-04 ENCOUNTER — Ambulatory Visit (HOSPITAL_COMMUNITY): Payer: 59 | Attending: Nephrology

## 2023-08-10 ENCOUNTER — Ambulatory Visit: Payer: Self-pay | Admitting: Family

## 2023-08-10 NOTE — Telephone Encounter (Signed)
Called pt back: no answer: unable to leave voicemail due to voicemail did not come on.  Will route chart/message to PCP office.

## 2023-08-10 NOTE — Telephone Encounter (Signed)
Called pt: no answer: left message: will attempt to reach back out to pt.

## 2023-08-10 NOTE — Telephone Encounter (Signed)
Copied From CRM [id number]. Reason for Triage: Patient fell a few times this week and has some bruising. Patient has a knot on her head  and bruising on her left buttocks. Please give daughter-in-law Ivee Swanigan a call  back at [phone]

## 2023-08-10 NOTE — Telephone Encounter (Signed)
Called and spoke with Daughter inlaw and gave appt with [facility] per their request

## 2023-08-14 ENCOUNTER — Encounter: Payer: Self-pay | Admitting: Family

## 2023-08-14 ENCOUNTER — Ambulatory Visit (INDEPENDENT_AMBULATORY_CARE_PROVIDER_SITE_OTHER): Payer: 59 | Admitting: Family

## 2023-08-14 VITALS — BP 121/88 | HR 95 | Temp 97.6°F | Ht 64.0 in

## 2023-08-14 DIAGNOSIS — J9620 Acute and chronic respiratory failure, unspecified whether with hypoxia or hypercapnia: Secondary | ICD-10-CM

## 2023-08-14 DIAGNOSIS — K219 Gastro-esophageal reflux disease without esophagitis: Secondary | ICD-10-CM

## 2023-08-14 DIAGNOSIS — I4891 Unspecified atrial fibrillation: Secondary | ICD-10-CM

## 2023-08-14 DIAGNOSIS — F411 Generalized anxiety disorder: Secondary | ICD-10-CM

## 2023-08-14 DIAGNOSIS — F339 Major depressive disorder, recurrent, unspecified: Secondary | ICD-10-CM

## 2023-08-14 DIAGNOSIS — M545 Low back pain, unspecified: Secondary | ICD-10-CM

## 2023-08-14 DIAGNOSIS — J9611 Chronic respiratory failure with hypoxia: Secondary | ICD-10-CM

## 2023-08-14 DIAGNOSIS — E782 Mixed hyperlipidemia: Secondary | ICD-10-CM

## 2023-08-14 DIAGNOSIS — F410 Panic disorder [episodic paroxysmal anxiety] without agoraphobia: Secondary | ICD-10-CM

## 2023-08-14 DIAGNOSIS — M4716 Other spondylosis with myelopathy, lumbar region: Secondary | ICD-10-CM

## 2023-08-14 DIAGNOSIS — Z515 Encounter for palliative care: Secondary | ICD-10-CM

## 2023-08-14 DIAGNOSIS — R791 Abnormal coagulation profile: Secondary | ICD-10-CM

## 2023-08-14 LAB — COAGUCHEK XS/INR WAIVED
INR: 1.3 — ABNORMAL HIGH (ref 0.9–1.1)
Prothrombin Time: 15.1 s

## 2023-08-14 MED ORDER — ESOMEPRAZOLE MAGNESIUM 40 MG PO CPDR: 40.0000 mg | DELAYED_RELEASE_CAPSULE | Freq: Every day | ORAL | 1 refills | Status: DC

## 2023-08-14 MED ORDER — ATORVASTATIN CALCIUM 80 MG PO TABS: ORAL_TABLET | ORAL | 3 refills | Status: DC

## 2023-08-14 MED ORDER — DULOXETINE HCL 60 MG PO CPEP: ORAL_CAPSULE | ORAL | 1 refills | Status: DC

## 2023-08-14 MED ORDER — METOPROLOL SUCCINATE ER 50 MG PO TB24: ORAL_TABLET | ORAL | 1 refills | Status: DC

## 2023-08-14 MED ORDER — DIAZEPAM 2 MG PO TABS
2.0000 mg | ORAL_TABLET | Freq: Two times a day (BID) | ORAL | 2 refills | Status: DC | PRN
Start: 2023-08-14 — End: 2023-09-09

## 2023-08-14 NOTE — Progress Notes (Signed)
Subjective:    Patient ID: Melinda Collins, female    DOB: 1940/11/18, 83 y.o.   MRN: [id number]  Chief Complaint  Patient presents with   Hospitalization Follow-up    Had PNA went to [facility]   Fall   PT presents to the office today for chronic follow up.    She is taking dexadex 20 mg daily. Her weight is stable.      08/14/2023   12:17 PM 07/24/2023   11:47 AM 04/20/2023    1:18 PM  Last 3 Weights  Weight (lbs) -- 190 lb 190 lb 12.8 oz  Weight (kg) -- 86.183 kg 86.546 kg       She is followed by Palliative care.  She is followed by Cardiologists for A fib and taking warfarin.    She reports her breathing is improving after her pneumonia and using her 2-3 L. She was discharged from the hospital on 08/12/23 and given Augmentin and doxycycline.  She has COPD and quit smoking 07/2020.    She has aortic atherosclerosis and takes Lipitor daily.   Anxiety Presents for follow-up visit. Symptoms include excessive worry, irritability, nervous/anxious behavior and restlessness. Symptoms occur occasionally. The severity of symptoms is moderate.    Depression        This is a chronic problem.  The current episode started more than 1 year ago.   The problem occurs intermittently.  Associated symptoms include hopelessness, restlessness and sad.  Associated symptoms include no helplessness.  Past treatments include SNRIs - Serotonin and norepinephrine reuptake inhibitors.  Past medical history includes anxiety.   Back Pain This is a chronic problem. The current episode started more than 1 year ago. The problem occurs intermittently. The pain is present in the lumbar spine. The quality of the pain is described as aching. The pain is at a severity of 9/10. The pain is moderate. She has tried analgesics for the symptoms. The treatment provided mild relief.  Fall The accident occurred 5 to 7 days ago. There was no blood loss. The point of impact was the buttocks. The pain is mild.       Review of Systems  Constitutional:  Positive for irritability.  Musculoskeletal:  Positive for back pain.  Psychiatric/Behavioral:  The patient is nervous/anxious.   All other systems reviewed and are negative.      Objective:   Physical Exam Vitals reviewed.  Constitutional:      General: She is not in acute distress.    Appearance: She is well-developed. She is obese.  HENT:     Head: Normocephalic and atraumatic.     Right Ear: Tympanic membrane normal.     Left Ear: Tympanic membrane normal.  Eyes:     Pupils: Pupils are equal, round, and reactive to light.  Neck:     Thyroid: No thyromegaly.  Cardiovascular:     Rate and Rhythm: Normal rate and regular rhythm.     Heart sounds: Normal heart sounds. No murmur heard. Pulmonary:     Effort: Pulmonary effort is normal. No respiratory distress.     Breath sounds: Wheezing and rhonchi present.  Abdominal:     General: Bowel sounds are normal. There is no distension.     Palpations: Abdomen is soft.     Tenderness: There is no abdominal tenderness.  Musculoskeletal:        General: No tenderness. Normal range of motion.     Cervical back: Normal range of motion and neck  supple.  Skin:    General: Skin is warm and dry.  Neurological:     Mental Status: She is alert and oriented to person, place, and time.     Cranial Nerves: No cranial nerve deficit.     Motor: Weakness present.     Gait: Gait abnormal.     Deep Tendon Reflexes: Reflexes are normal and symmetric.  Psychiatric:        Behavior: Behavior normal.        Thought Content: Thought content normal.        Judgment: Judgment normal.       BP 121/88   Pulse 95   Temp 97.6 F (36.4 C) (Temporal)   Ht 5\' 4"  (1.626 m)   SpO2 93%   BMI 32.61 kg/m      Assessment & Plan:   Melinda Collins comes in today with chief complaint of Hospitalization Follow-up (Had PNA went to [facility]) and Fall   Diagnosis and orders addressed:  1. Mixed  hyperlipidemia - atorvastatin (LIPITOR) 80 MG tablet; TAKE 1 TABLET BY MOUTH EVERY EVENING AT 6pm **NEEDS TO BE SEEN BEFORE NEXT REFILL**  Dispense: 90 tablet; Refill: 3 - CMP14+EGFR - CBC with Differential/Platelet  2. GAD (generalized anxiety disorder) - diazepam (VALIUM) 2 MG tablet; Take 1 tablet (2 mg total) by mouth every 12 (twelve) hours as needed for anxiety.  Dispense: 60 tablet; Refill: 2 - CMP14+EGFR - CBC with Differential/Platelet  3. Panic attack - diazepam (VALIUM) 2 MG tablet; Take 1 tablet (2 mg total) by mouth every 12 (twelve) hours as needed for anxiety.  Dispense: 60 tablet; Refill: 2 - CMP14+EGFR - CBC with Differential/Platelet  4. Lumbar spondylosis with myelopathy - DULoxetine (CYMBALTA) 60 MG capsule; TAKE TWO CAPSULES BY MOUTH EVERY EVENING **NEEDS TO BE SEEN BEFORE NEXT REFILL**  Dispense: 90 capsule; Refill: 1 - CMP14+EGFR - CBC with Differential/Platelet  5. Depression, recurrent (HCC) - DULoxetine (CYMBALTA) 60 MG capsule; TAKE TWO CAPSULES BY MOUTH EVERY EVENING **NEEDS TO BE SEEN BEFORE NEXT REFILL**  Dispense: 90 capsule; Refill: 1 - CMP14+EGFR - CBC with Differential/Platelet  6. Chronic midline low back pain without sciatica - DULoxetine (CYMBALTA) 60 MG capsule; TAKE TWO CAPSULES BY MOUTH EVERY EVENING **NEEDS TO BE SEEN BEFORE NEXT REFILL**  Dispense: 90 capsule; Refill: 1 - CMP14+EGFR - CBC with Differential/Platelet  7. Acute on chronic respiratory failure, unspecified whether with hypoxia or hypercapnia (HCC) (Primary) - CMP14+EGFR - CBC with Differential/Platelet  8. Atrial fibrillation with RVR (HCC)  - metoprolol succinate (TOPROL-XL) 50 MG 24 hr tablet; TAKE 1 TABLET BY MOUTH EVERY MORNING, with OR immediatlely following a meal  Dispense: 90 tablet; Refill: 1 - CoaguChek XS/INR Waived - CMP14+EGFR - CBC with Differential/Platelet  9. Chronic respiratory failure with hypoxia (HCC) - CMP14+EGFR - CBC with  Differential/Platelet  10. Palliative care patien - CMP14+EGFR - CBC with Differential/Platelet  11. Supratherapeutic INR - CoaguChek XS/INR Waived - CMP14+EGFR - CBC with Differential/Platelet  12. Gastroesophageal reflux disease, unspecified whether esophagitis present - esomeprazole (NEXIUM) 40 MG capsule; Take 1 capsule (40 mg total) by mouth daily. **NEEDS TO BE SEEN BEFORE NEXT REFILL**  Dispense: 90 capsule; Refill: 1 - CMP14+EGFR - CBC with Differential/Platelet    Labs pending Continue current medications  Keep follow up with specialists  Health Maintenance reviewed Diet and exercise encouraged  Follow up plan: 3 months    Jannifer Rodney, FNP

## 2023-08-14 NOTE — Patient Instructions (Signed)
Community-Acquired Pneumonia, Adult Pneumonia is a lung infection that causes inflammation and the buildup of mucus and fluids in the lungs. This may cause coughing and difficulty breathing. Community-acquired pneumonia is pneumonia that develops in people who are not, and have not recently been, in a hospital or other health care facility. Usually, pneumonia develops as a result of an illness that is caused by a virus, such as the common cold and the flu (influenza). It can also be caused by bacteria or fungi. While the common cold and influenza can pass from person to person (are contagious), pneumonia itself is not considered contagious. What are the causes? This condition may be caused by: Viruses. Bacteria. Fungi. What increases the risk? The following factors may make you more likely to develop this condition: Being over age 22 or having certain medical conditions, such as: A long-term (chronic) disease, such as: chronic obstructive pulmonary disease (COPD), asthma, heart failure, diabetes, or kidney disease. A condition that increases the risk of breathing in (aspirating) mucus and other fluids from your mouth and nose. A weakened body defense system (immune system). Having had your spleen removed (splenectomy). The spleen is the organ that helps fight germs and infections. Not cleaning your teeth and gums well (poor dental hygiene). Using tobacco products. Traveling to places where germs that cause pneumonia are present or being near certain animals or animal habitats that could have germs that cause pneumonia. What are the signs or symptoms? Symptoms of this condition include: A dry cough or a wet (productive) cough. A fever, sweating, or chills. Chest pain, especially when breathing deeply or coughing. Fast breathing, difficulty breathing, or shortness of breath. Tiredness (fatigue) and muscle aches. How is this diagnosed? This condition may be diagnosed based on your medical  history or a physical exam. You may also have tests, including: Imaging, such as a chest X-ray or lung ultrasound. Tests of: The level of oxygen and other gases in your blood. Mucus from your lungs (sputum). Fluid around your lungs (pleural fluid). Your urine. How is this treated? Treatment for this condition depends on many factors, such as the cause of your pneumonia, your medicines, and other medical conditions that you have. For most adults, pneumonia may be treated at home. In some cases, treatment must happen in a hospital and may include: Medicines that are given by mouth (orally) or through an IV, including: Antibiotic medicines, if bacteria caused the pneumonia. Medicines that kill viruses (antiviral medicines), if a virus caused the pneumonia. Oxygen therapy. Severe pneumonia, although rare, may require the following treatments: Mechanical ventilation.This procedure uses a machine to help you breathe if you cannot breathe well on your own or maintain a safe level of blood oxygen. Thoracentesis. This procedure removes any buildup of pleural fluid to help with breathing. Follow these instructions at home:  Medicines Take over-the-counter and prescription medicines only as told by your health care provider. Take cough medicine only if you have trouble sleeping. Cough medicine can prevent your body from removing mucus from your lungs. If you were prescribed antibiotics, take them as told by your health care provider. Do not stop taking the antibiotic even if you start to feel better. Lifestyle     Do not drink alcohol. Do not use any products that contain nicotine or tobacco. These products include cigarettes, chewing tobacco, and vaping devices, such as e-cigarettes. If you need help quitting, ask your health care provider. Eat a healthy diet. This includes plenty of vegetables, fruits, whole grains, low-fat  dairy products, and lean protein. General instructions Rest a lot and  get at least 8 hours of sleep each night. Sleep in a partly upright position at night. Place a few pillows under your head or sleep in a reclining chair. Return to your normal activities as told by your health care provider. Ask your health care provider what activities are safe for you. Drink enough fluid to keep your urine pale yellow. This helps to thin the mucus in your lungs. If your throat is sore, gargle with a mixture of salt and water 3-4 times a day or as needed. To make salt water, completely dissolve -1 tsp (3-6 g) of salt in 1 cup (237 mL) of warm water. Keep all follow-up visits. How is this prevented? You can lower your risk of developing community-acquired pneumonia by: Getting the pneumonia vaccine. There are different types and schedules of pneumonia vaccines. Ask your health care provider which option is best for you. Consider getting the pneumonia vaccine if: You are older than 82 years of age. You are 64-74 years of age and are receiving cancer treatment, have chronic lung disease, or have other medical conditions that affect your immune system. Ask your health care provider if this applies to you. Getting your influenza vaccine every year. Ask your health care provider which type of vaccine is best for you. Getting regular dental checkups. Washing your hands often with soap and water for at least 20 seconds. If soap and water are not available, use hand sanitizer. Contact a health care provider if: You have a fever. You have trouble sleeping because you cannot control your cough with cough medicine. Get help right away if: Your shortness of breath becomes worse. Your chest pain increases. Your sickness becomes worse, especially if you are an older adult or have a weak immune system. You cough up blood. These symptoms may be an emergency. Get help right away. Call 911. Do not wait to see if the symptoms will go away. Do not drive yourself to the  hospital. Summary Pneumonia is an infection of the lungs. Community-acquired pneumonia develops in people who have not been in the hospital. It can be caused by bacteria, viruses, or fungi. This condition may be treated with antibiotics or antiviral medicines. Severe pneumonia may require a hospital stay and treatment to help with breathing. This information is not intended to replace advice given to you by your health care provider. Make sure you discuss any questions you have with your health care provider. Document Revised: 03/30/2023 Document Reviewed: 09/01/2021 Elsevier Patient Education  2024 [employer].

## 2023-08-15 ENCOUNTER — Telehealth: Payer: Self-pay | Admitting: *Deleted

## 2023-08-15 LAB — CBC WITH DIFFERENTIAL/PLATELET
Basophils Absolute: 0 10*3/uL (ref 0.0–0.2)
Basos: 0 %
EOS (ABSOLUTE): 0 10*3/uL (ref 0.0–0.4)
Eos: 0 %
Hematocrit: 33.2 % — ABNORMAL LOW (ref 34.0–46.6)
Hemoglobin: 10.3 g/dL — ABNORMAL LOW (ref 11.1–15.9)
Immature Grans (Abs): 0.1 10*3/uL (ref 0.0–0.1)
Immature Granulocytes: 1 %
Lymphocytes Absolute: 0.6 10*3/uL — ABNORMAL LOW (ref 0.7–3.1)
Lymphs: 6 %
MCH: 27.9 pg (ref 26.6–33.0)
MCHC: 31 g/dL — ABNORMAL LOW (ref 31.5–35.7)
MCV: 90 fL (ref 79–97)
Monocytes Absolute: 0.5 10*3/uL (ref 0.1–0.9)
Monocytes: 4 %
Neutrophils Absolute: 9.5 10*3/uL — ABNORMAL HIGH (ref 1.4–7.0)
Neutrophils: 89 %
Platelets: 254 10*3/uL (ref 150–450)
RBC: 3.69 x10E6/uL — ABNORMAL LOW (ref 3.77–5.28)
RDW: 14.7 % (ref 11.7–15.4)
WBC: 10.7 10*3/uL (ref 3.4–10.8)

## 2023-08-15 LAB — CMP14+EGFR
ALT: 16 IU/L (ref 0–32)
AST: 14 IU/L (ref 0–40)
Albumin: 3.6 g/dL — ABNORMAL LOW (ref 3.7–4.7)
Alkaline Phosphatase: 130 IU/L — ABNORMAL HIGH (ref 44–121)
BUN/Creatinine Ratio: 24 (ref 12–28)
BUN: 55 mg/dL — ABNORMAL HIGH (ref 8–27)
Bilirubin Total: 0.3 mg/dL (ref 0.0–1.2)
CO2: 19 mmol/L — ABNORMAL LOW (ref 20–29)
Calcium: 9 mg/dL (ref 8.7–10.3)
Chloride: 104 mmol/L (ref 96–106)
Creatinine, Ser: 2.25 mg/dL — ABNORMAL HIGH (ref 0.57–1.00)
Globulin, Total: 2.3 g/dL (ref 1.5–4.5)
Glucose: 136 mg/dL — ABNORMAL HIGH (ref 70–99)
Potassium: 5.2 mmol/L (ref 3.5–5.2)
Sodium: 140 mmol/L (ref 134–144)
Total Protein: 5.9 g/dL — ABNORMAL LOW (ref 6.0–8.5)
eGFR: 21 mL/min/{1.73_m2} — ABNORMAL LOW (ref >59)

## 2023-08-15 NOTE — Transitions of Care (Post Inpatient/ED Visit) (Signed)
   08/15/2023  Name: Melinda Collins MRN: [id number] DOB: 1941/01/08  Today's TOC FU Call Status: Today's TOC FU Call Status:: Unsuccessful Call (1st Attempt) Unsuccessful Call (1st Attempt) Date: 08/15/23  Attempted to reach the patient regarding the most recent Inpatient/ED visit.  Follow Up Plan: Additional outreach attempts will be made to reach the patient to complete the Transitions of Care (Post Inpatient/ED visit) call.   Irving Shows [facility], BSN RN Care Manager/ Transition of Care [place]/ [facility] [phone]

## 2023-08-16 ENCOUNTER — Telehealth: Payer: Self-pay

## 2023-08-16 NOTE — Transitions of Care (Post Inpatient/ED Visit) (Signed)
08/16/2023  Name: Melinda Collins MRN: [id number] DOB: 08-Aug-1940  Today's TOC FU Call Status: Today's TOC FU Call Status:: Successful TOC FU Call Completed TOC FU Call Complete Date: 08/16/23 Patient's Name and Date of Birth confirmed.  Transition Care Management Follow-up Telephone Call Date of Discharge: 08/12/23 Discharge Facility: Other ([facility]) Name of Other ([facility]) Discharge Facility: [facility] Type of Discharge: Inpatient Admission Primary Inpatient Discharge Diagnosis:: Pneumonia of left upper lobe How have you been since you were released from the hospital?: Better (daughter in law reports 50% better. Continues on her oxygen) Any questions or concerns?: No  Items Reviewed: Did you receive and understand the discharge instructions provided?: Yes Medications obtained,verified, and reconciled?: Yes (Medications Reviewed) Any new allergies since your discharge?: No Dietary orders reviewed?: Yes Type of Diet Ordered:: low sodium heart healthy diet Do you have support at home?: Yes People in Home: child(ren), adult Name of Support/Comfort Primary Source: daughter in law  Medications Reviewed Today: Medications Reviewed Today     Reviewed by Earlie Server, RN (Registered Nurse) on 08/16/23 at 1130  Med List Status: <None>   Medication Order Taking? Sig Documenting Provider Last Dose Status Informant  albuterol (VENTOLIN HFA) 108 (90 Base) MCG/ACT inhaler [id number] Yes INHALE 1-2 PUFFS into THE lungs EVERY 4 TO 6 HOURS AS NEEDED FOR SHORTNESS OF BREATH Jannifer Rodney A, FNP Taking Active   amoxicillin-clavulanate (AUGMENTIN) 875-125 MG tablet [id number] Yes Take by mouth. [provider] Taking Active   atorvastatin (LIPITOR) 80 MG tablet [id number] Yes TAKE 1 TABLET BY MOUTH EVERY EVENING AT 6pm **NEEDS TO BE SEEN BEFORE NEXT REFILL** Hawks, Christy A, FNP Taking Active   diazepam (VALIUM) 2 MG tablet [id number] Yes Take 1 tablet (2 mg total) by  mouth every 12 (twelve) hours as needed for anxiety. Junie Spencer, FNP Taking Active   diltiazem (CARDIZEM CD) 240 MG 24 hr capsule [id number] Yes Take 1 capsule by mouth daily. [provider] Taking Active   doxycycline (VIBRA-TABS) 100 MG tablet [id number] Yes Take by mouth. [provider] Taking Active   DULoxetine (CYMBALTA) 60 MG capsule [id number] Yes TAKE TWO CAPSULES BY MOUTH EVERY EVENING **NEEDS TO BE SEEN BEFORE NEXT REFILL** Hawks, Christy A, FNP Taking Active   esomeprazole (NEXIUM) 40 MG capsule [id number] Yes Take 1 capsule (40 mg total) by mouth daily. **NEEDS TO BE SEEN BEFORE NEXT REFILL** Hawks, Christy A, FNP Taking Active   fluconazole (DIFLUCAN) 150 MG tablet [id number] Yes Take 1 tablet (150 mg total) by mouth every three (3) days as needed. Junie Spencer, FNP Taking Active   fluticasone (FLONASE) 50 MCG/ACT nasal spray [id number] Yes USE ONE SPRAY IN EACH NOSTRIL ONCE DAILY. SHAKE GENTLY BEFORE USING. Junie Spencer, FNP Taking Active   Fluticasone-Umeclidin-Vilant (TRELEGY ELLIPTA) 100-62.5-25 MCG/ACT AEPB [id number] Yes INHALE 1 PUFF into THE lungs ONCE DAILY **NEEDS TO BE SEEN BEFORE NEXT REFILL** Hawks, Christy A, FNP Taking Active   guaiFENesin (MUCINEX) 600 MG 12 hr tablet [id number] Yes Take 1 tablet (600 mg total) by mouth 2 (two) times daily as needed. Rollene Rotunda, MD Taking Active Family Member  levalbuterol Pauline Aus) 1.25 MG/3ML nebulizer solution [id number] Yes Inhale 1.25 mg into the lungs every 6 (six) hours as needed for shortness of breath or wheezing. [provider] Taking Active   loratadine (ALLERGY RELIEF) 10 MG tablet [id number] Yes TAKE ONE TABLET BY MOUTH DAILY (,EVENING) Hawks, Christy A, FNP Taking Active   metoprolol succinate (TOPROL-XL) 50  MG 24 hr tablet [id number] Yes TAKE 1 TABLET BY MOUTH EVERY MORNING, with OR immediatlely following a meal Hawks, Christy A, FNP Taking Active   nystatin (MYCOSTATIN/NYSTOP) powder  [id number] Yes Apply 1 Application topically 3 (three) times daily. Junie Spencer, FNP Taking Active   nystatin cream (MYCOSTATIN) [id number] Yes Apply 1 Application topically 2 (two) times daily. Jannifer Rodney A, FNP Taking Active   polyethylene glycol (MIRALAX / GLYCOLAX) 17 g packet [id number] No Take 17 g by mouth daily.  Patient not taking: Reported on 08/16/2023   Junie Spencer, FNP Not Taking Active Family Member           Med Note Sharen Hones   Thu Apr 13, 2023  9:36 AM)    predniSONE (DELTASONE) 20 MG tablet [id number] Yes Take by mouth. [provider] Taking Active   torsemide (DEMADEX) 20 MG tablet [id number] Yes Take 1 tablet (20 mg total) by mouth daily. Sharlene Dory, NP Taking Active   Vitamin D, Ergocalciferol, 50000 units CAPS [id number] Yes Take 50,000 Units by mouth every 7 (seven) days. Mondays Junie Spencer, FNP Taking Active   warfarin (COUMADIN) 5 MG tablet [id number] Yes TAKE 1 OR 2 TABLETS BY MOUTH AS DIRECTED BY [facility] (T1,TABLET DAILY EXCEPT ON TUESDAY TAKE 1 AND 1/2 TABLETS) Hawks, Edilia Bo, FNP Taking Active             Home Care and Equipment/Supplies: Were Home Health Services Ordered?: No Any new equipment or medical supplies ordered?: No  Functional Questionnaire: Do you need assistance with bathing/showering or dressing?: Yes (Care giver) Do you need assistance with meal preparation?: Yes (care giver) Do you need assistance with eating?: No Do you have difficulty maintaining continence: No Do you need assistance with getting out of bed/getting out of a chair/moving?: Yes Do you have difficulty managing or taking your medications?: No  Follow up appointments reviewed: PCP Follow-up appointment confirmed?: Yes Date of PCP follow-up appointment?: 08/14/23 Follow-up Provider: [facility] Follow-up appointment confirmed?: No Do you need transportation to your follow-up appointment?: No Do you understand care  options if your condition(s) worsen?: Yes-patient verbalized understanding  SDOH Interventions Today    Flowsheet Row Most Recent Value  SDOH Interventions   Food Insecurity Interventions Intervention Not Indicated  Housing Interventions Intervention Not Indicated  Transportation Interventions Intervention Not Indicated  Utilities Interventions Intervention Not Indicated      Interventions Today    Flowsheet Row Most Recent Value  Chronic Disease   Chronic disease during today's visit Other  [pneumonia]  General Interventions   General Interventions Discussed/Reviewed General Interventions Discussed, Doctor Visits  Doctor Visits Discussed/Reviewed Doctor Visits Discussed, Doctor Visits Reviewed  Education Interventions   Education Provided Provided Education  [enocuraged family to seek medical attention for any worsening condition]  Provided Verbal Education On Nutrition, Medication, When to see the doctor  Pharmacy Interventions   Pharmacy Dicussed/Reviewed Medications and their functions       TOC Interventions Today    Flowsheet Row Most Recent Value  TOC Interventions   TOC Interventions Discussed/Reviewed TOC Interventions Discussed, TOC Interventions Reviewed, S/S of infection       Patient is living with son and daughter in law. DPR verified.  Daughter in law reports that patient is about 50 % improved. Has already had hospital follow up.  Patient has all medications and is taking medications as prescribed, Patient also has paid caregivers.  Reviewed with daughter in law if patient does not  continue to improve to call MD. Patient will finish antibiotics and prednisone tomorrow.  Reviewed 30 day TOC program and daughter in law declined.  Encouraged daughter in law to call MD if needed.  Lonia Chimera, RN, BSN, CEN [employer]- Transition of Care Team.  [facility] [phone]

## 2023-08-21 ENCOUNTER — Encounter: Payer: Self-pay | Admitting: Family

## 2023-08-21 MED ORDER — PREDNISONE 20 MG PO TABS: 40.0000 mg | ORAL_TABLET | Freq: Every day | ORAL | 0 refills | Status: AC

## 2023-08-21 MED ORDER — AMOXICILLIN-POT CLAVULANATE 875-125 MG PO TABS: 1.0000 | ORAL_TABLET | Freq: Two times a day (BID) | ORAL | 0 refills | Status: AC

## 2023-08-21 NOTE — Addendum Note (Signed)
Addended by: Jannifer Rodney A on: 08/21/2023 12:59 PM   Modules accepted: Orders

## 2023-08-30 ENCOUNTER — Ambulatory Visit: Payer: 59 | Admitting: Nurse Practitioner

## 2023-08-30 NOTE — Progress Notes (Deleted)
   Acute Office Visit  Subjective:     Patient ID: Melinda Collins, female    DOB: 1940-11-25, 83 y.o.   MRN: [id number]  No chief complaint on file.   HPI Melinda Collins is a 83 y.o. female who complains of {uri sx:315001} for *** days. She denies a history of {hx resp sx additional:315009} and {has/denies:315300} a history of asthma. Patient {has/denies:315300} smoke cigarettes.     ROS Negative unless indicated in HPI    Objective:    There were no vitals taken for this visit. {Vitals History (Optional):23777}  Physical Exam She appears well, vital signs are as noted. Ears normal.  Throat and pharynx normal.  Neck supple. No adenopathy in the neck. Nose is congested. Sinuses non tender. The chest is clear, without wheezes or rales. No results found for any visits on 08/30/23.      Assessment & Plan:  There are no diagnoses linked to this encounter.  ASSESSMENT:  {uri dx:315273::viral upper respiratory illness}  PLAN: Symptomatic therapy suggested: {resp plan:315236}. Lack of antibiotic effectiveness discussed with her. Call or return to clinic prn if these symptoms worsen or fail to improve as anticipated.  No follow-ups on file.  Arrie Aran Santa Lighter, [place] [facility] [address] [place], [place] [address] [phone]  Note: This document was prepared by [employer] voice dictation technology and any errors that results from this process are unintentional.

## 2023-09-03 ENCOUNTER — Other Ambulatory Visit: Payer: Self-pay | Admitting: Family

## 2023-09-03 DIAGNOSIS — J439 Emphysema, unspecified: Secondary | ICD-10-CM

## 2023-09-16 DEATH — deceased

## 2023-10-19 ENCOUNTER — Institutional Professional Consult (permissible substitution): Payer: 59 | Admitting: Internal Medicine

## 2023-11-13 ENCOUNTER — Ambulatory Visit: Payer: 59 | Admitting: Family

## 2024-07-24 ENCOUNTER — Ambulatory Visit: Payer: 59
